# Patient Record
Sex: Male | Born: 1950
Health system: Southern US, Community
[De-identification: ages and names within clinical notes are randomized; demographics above are authoritative.]

## PROBLEM LIST (undated history)

## (undated) DIAGNOSIS — G500 Trigeminal neuralgia: Secondary | ICD-10-CM

## (undated) DIAGNOSIS — H9190 Unspecified hearing loss, unspecified ear: Secondary | ICD-10-CM

## (undated) DIAGNOSIS — S68111A Complete traumatic metacarpophalangeal amputation of left index finger, initial encounter: Secondary | ICD-10-CM

## (undated) DIAGNOSIS — E785 Hyperlipidemia, unspecified: Secondary | ICD-10-CM

## (undated) DIAGNOSIS — E119 Type 2 diabetes mellitus without complications: Secondary | ICD-10-CM

## (undated) DIAGNOSIS — R251 Tremor, unspecified: Secondary | ICD-10-CM

## (undated) HISTORY — DX: Trigeminal neuralgia: G50.0

## (undated) HISTORY — DX: Hyperlipidemia, unspecified: E78.5

## (undated) HISTORY — DX: Type 2 diabetes mellitus without complications: E11.9

## (undated) HISTORY — PX: FINGER AMPUTATION: SHX636

## (undated) HISTORY — PX: INNER EAR SURGERY: SHX679

## (undated) HISTORY — DX: Complete traumatic metacarpophalangeal amputation of left index finger, initial encounter: S68.111A

## (undated) HISTORY — DX: Tremor, unspecified: R25.1

## (undated) HISTORY — DX: Unspecified hearing loss, unspecified ear: H91.90

---
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
## 2004-04-27 ENCOUNTER — Ambulatory Visit: Payer: Self-pay | Admitting: Family Medicine

## 2004-08-01 ENCOUNTER — Ambulatory Visit: Payer: Self-pay | Admitting: Family Medicine

## 2004-09-13 ENCOUNTER — Ambulatory Visit: Payer: Self-pay | Admitting: Family Medicine

## 2004-10-09 ENCOUNTER — Ambulatory Visit: Payer: Self-pay | Admitting: Family Medicine

## 2005-01-09 ENCOUNTER — Ambulatory Visit: Payer: Self-pay | Admitting: Family Medicine

## 2005-05-23 ENCOUNTER — Ambulatory Visit: Payer: Self-pay | Admitting: Family Medicine

## 2005-08-28 ENCOUNTER — Ambulatory Visit: Payer: Self-pay | Admitting: Family Medicine

## 2005-12-05 ENCOUNTER — Ambulatory Visit: Payer: Self-pay | Admitting: Family Medicine

## 2006-04-01 ENCOUNTER — Ambulatory Visit: Payer: Self-pay | Admitting: Family Medicine

## 2006-08-27 ENCOUNTER — Ambulatory Visit: Payer: Self-pay | Admitting: Family Medicine

## 2006-09-17 ENCOUNTER — Ambulatory Visit: Payer: Self-pay | Admitting: Family Medicine

## 2012-03-24 DIAGNOSIS — Z5181 Encounter for therapeutic drug level monitoring: Secondary | ICD-10-CM | POA: Insufficient documentation

## 2012-03-24 DIAGNOSIS — G250 Essential tremor: Secondary | ICD-10-CM | POA: Insufficient documentation

## 2012-03-24 DIAGNOSIS — G252 Other specified forms of tremor: Secondary | ICD-10-CM | POA: Insufficient documentation

## 2012-03-24 DIAGNOSIS — G500 Trigeminal neuralgia: Secondary | ICD-10-CM | POA: Insufficient documentation

## 2012-08-01 ENCOUNTER — Other Ambulatory Visit: Payer: Self-pay

## 2012-08-01 ENCOUNTER — Other Ambulatory Visit: Payer: Self-pay | Admitting: Neurology

## 2012-08-01 MED ORDER — CLONAZEPAM 0.25 MG PO TBDP: 0.2500 mg | ORAL_TABLET | Freq: Two times a day (BID) | ORAL | Status: DC

## 2012-08-01 NOTE — Telephone Encounter (Signed)
Patient called clinic saying he wanted to get a refill on Klonopin 0.25mg  from Dr Anne Hahn.  I will add drug to med list and forward request to prescriber for approval since this is a controlled substance medication.

## 2012-09-20 ENCOUNTER — Encounter: Payer: Self-pay | Admitting: Neurology

## 2012-09-20 DIAGNOSIS — G250 Essential tremor: Secondary | ICD-10-CM

## 2012-09-20 DIAGNOSIS — G500 Trigeminal neuralgia: Secondary | ICD-10-CM

## 2012-09-20 DIAGNOSIS — Z5181 Encounter for therapeutic drug level monitoring: Secondary | ICD-10-CM

## 2012-09-22 ENCOUNTER — Encounter: Payer: Self-pay | Admitting: Neurology

## 2012-09-22 ENCOUNTER — Other Ambulatory Visit: Payer: Self-pay

## 2012-09-22 ENCOUNTER — Ambulatory Visit (INDEPENDENT_AMBULATORY_CARE_PROVIDER_SITE_OTHER): Payer: BC Managed Care – PPO | Admitting: Neurology

## 2012-09-22 VITALS — BP 113/69 | HR 69 | Ht 72.5 in | Wt 167.0 lb

## 2012-09-22 DIAGNOSIS — G250 Essential tremor: Secondary | ICD-10-CM

## 2012-09-22 DIAGNOSIS — Z5181 Encounter for therapeutic drug level monitoring: Secondary | ICD-10-CM

## 2012-09-22 DIAGNOSIS — G500 Trigeminal neuralgia: Secondary | ICD-10-CM

## 2012-09-22 MED ORDER — PREGABALIN 100 MG PO CAPS: 100.0000 mg | ORAL_CAPSULE | Freq: Two times a day (BID) | ORAL | Status: DC

## 2012-09-22 NOTE — Telephone Encounter (Signed)
Patient was at checkout.  Requested a 90 day Rx (rather than 30 day) for Lyrica.  He would like the Rx sent to [facility].

## 2012-09-22 NOTE — Progress Notes (Signed)
Reason for visit: Benign essential tremor  John Reese is an 62 y.o. male  History of present illness:  John Reese is a 62 year old right-handed white male with a history of the right sided trigeminal neuralgia in the V2 distribution, and a history of an essential tremor. The patient was placed on clonazepam last seen, and he believes that this has helped his tremor. The patient is able to feed himself better, but he indicates that when he holds things in his hands, he still has significant tremor. The patient has noted increased pain in the right face within the last month or 6 weeks. The patient will note that shaving or brushing his teeth will bring on the pain. The patient has not noted any change in the appearance of his carbamazepine tablets. The patient remains on Lyrica taking 75 mg twice daily. The patient returns for an evaluation.  Past Medical History  Diagnosis Date  . Diabetes   . Dyslipidemia   . Tremor   . Degenerative arthritis   . Hypertension   . Auditory disturbance     Decreased auditory acuity  . 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 Trigeminal neuralgia     Right, V2 distribution    Past Surgical History  Procedure Laterality Date  . Inner ear surgery Right     Ear drum repair    Family History  Problem Relation Age of Onset  . Stroke Father   . Peripheral vascular disease Sister   . Heart attack Brother     Social history:  reports that he has never smoked. He does not have any smokeless tobacco history on file. He reports that he does not drink alcohol or use illicit drugs.  Allergies: No Known Allergies  Medications:  Current Outpatient Prescriptions on File Prior to Visit  Medication Sig Dispense Refill  . aspirin 81 MG tablet Take 81 mg by mouth daily.      . carbamazepine (TEGRETOL) 200 MG tablet Take 200 mg by mouth 3 (three) times daily.      . clonazePAM (KLONOPIN) 0.25 MG disintegrating  tablet Take 1 tablet (0.25 mg total) by mouth 2 (two) times daily.  60 tablet  5  . fish oil-omega-3 fatty acids 1000 MG capsule Take 2 g by mouth daily.      Marland Kitchen glyBURIDE (DIABETA) 5 MG tablet Take 5 mg by mouth 2 (two) times daily with a meal.      . Multiple Vitamin (MULTIVITAMIN) tablet Take 1 tablet by mouth daily.      . pioglitazone (ACTOS) 45 MG tablet Take 45 mg by mouth daily.      . ramipril (ALTACE) 2.5 MG capsule Take 2.5 mg by mouth daily.      . rosuvastatin (CRESTOR) 20 MG tablet Take 20 mg by mouth daily.      . sitaGLIPtan-metformin (JANUMET) 50-1000 MG per tablet Take 1 tablet by mouth 2 (two) times daily with a meal.       No current facility-administered medications on file prior to visit.    ROS:  Out of a complete 14 system review of symptoms, the patient complains only of the following symptoms, and all other reviewed systems are negative.  Tremor Facial pain  Blood pressure 113/69, pulse 69, height 6' 0.5" (1.842 m), weight 167 lb (75.751 kg).  Physical Exam  General: The patient is alert and cooperative at the time of the examination.  Skin: No  significant peripheral edema is noted.   Neurologic Exam  Cranial nerves: Facial symmetry is present. Speech is normal, no aphasia or dysarthria is noted. Extraocular movements are full. Visual fields are full.  Motor: The patient has good strength in all 4 extremities.  Coordination: The patient has good finger-nose-finger and heel-to-shin bilaterally.  Gait and station: The patient has a normal gait. Tandem gait is normal. Romberg is negative. No drift is seen. The patient does have an intention tremor with finger-nose-finger. With the arms flexed, the tremor in both arms becomes more prominent.  Reflexes: Deep tendon reflexes are symmetric.   Assessment/Plan:  One. Benign essential tremor  2. Right V2 distribution trigeminal neuralgia  The patient will be sent for blood work to check the carbamazepine  levels. The patient will go up on the Lyrica taking 100 mg twice daily. The carbamazepine dose may be adjusted as well if needed. The patient otherwise will followup in 4-6 months.  John Palau MD 09/22/2012 5:34 PM  [facility] [address] [place], [place] [address]  Phone [phone] Fax [phone]

## 2012-09-23 LAB — COMPREHENSIVE METABOLIC PANEL
AST: 12 IU/L (ref 0–40)
Albumin/Globulin Ratio: 2 (ref 1.1–2.5)
Albumin: 4.2 g/dL (ref 3.6–4.8)
BUN: 21 mg/dL (ref 8–27)
Calcium: 9.4 mg/dL (ref 8.6–10.2)
Creatinine, Ser: 1.12 mg/dL (ref 0.76–1.27)
GFR calc non Af Amer: 70 mL/min/{1.73_m2} (ref >59)
Globulin, Total: 2.1 g/dL (ref 1.5–4.5)
Sodium: 141 mmol/L (ref 134–144)
Total Protein: 6.3 g/dL (ref 6.0–8.5)

## 2012-09-23 LAB — CBC WITH DIFFERENTIAL
Basos: 0 % (ref 0–3)
Eos: 4 % (ref 0–5)
Eosinophils Absolute: 0.2 10*3/uL (ref 0.0–0.4)
HCT: 39 % (ref 37.5–51.0)
Hemoglobin: 13 g/dL (ref 12.6–17.7)
Lymphocytes Absolute: 1.5 10*3/uL (ref 0.7–3.1)
MCH: 29 pg (ref 26.6–33.0)
MCHC: 33.3 g/dL (ref 31.5–35.7)
MCV: 87 fL (ref 79–97)
Monocytes Absolute: 0.4 10*3/uL (ref 0.1–0.9)
Neutrophils Absolute: 2 10*3/uL (ref 1.4–7.0)

## 2012-09-23 NOTE — Progress Notes (Signed)
Quick Note:  I called and left a message for the patient that his lab results were normal. ______ 

## 2012-11-25 ENCOUNTER — Other Ambulatory Visit: Payer: Self-pay

## 2012-11-25 MED ORDER — PROPRANOLOL HCL ER 120 MG PO CP24: 120.0000 mg | ORAL_CAPSULE | Freq: Every day | ORAL | Status: DC

## 2013-01-30 ENCOUNTER — Other Ambulatory Visit: Payer: Self-pay | Admitting: Neurology

## 2013-02-18 ENCOUNTER — Other Ambulatory Visit: Payer: Self-pay | Admitting: Neurology

## 2013-03-24 ENCOUNTER — Other Ambulatory Visit: Payer: Self-pay

## 2013-03-24 MED ORDER — PREGABALIN 100 MG PO CAPS: 100.0000 mg | ORAL_CAPSULE | Freq: Two times a day (BID) | ORAL | Status: DC

## 2013-03-25 NOTE — Telephone Encounter (Signed)
Patient's prescription was faxed over to [facility] at [phone]. °

## 2013-04-03 ENCOUNTER — Ambulatory Visit (INDEPENDENT_AMBULATORY_CARE_PROVIDER_SITE_OTHER): Payer: BC Managed Care – PPO | Admitting: Neurology

## 2013-04-03 ENCOUNTER — Encounter: Payer: Self-pay | Admitting: Neurology

## 2013-04-03 VITALS — BP 121/72 | HR 68 | Ht 73.0 in | Wt 158.0 lb

## 2013-04-03 DIAGNOSIS — G250 Essential tremor: Secondary | ICD-10-CM

## 2013-04-03 DIAGNOSIS — G500 Trigeminal neuralgia: Secondary | ICD-10-CM

## 2013-04-03 MED ORDER — PREGABALIN 75 MG PO CAPS: 75.0000 mg | ORAL_CAPSULE | Freq: Two times a day (BID) | ORAL | Status: DC

## 2013-04-03 MED ORDER — CARBAMAZEPINE 200 MG PO TABS: ORAL_TABLET | ORAL | Status: DC

## 2013-04-03 NOTE — Patient Instructions (Signed)
Trigeminal Neuralgia Trigeminal neuralgia is a nerve disorder that causes sudden attacks of severe facial pain. It is caused by damage to the trigeminal nerve, a major nerve in the face. It is more common in women and in the elderly, although it can also happen in younger patients. Attacks last from a few seconds to several minutes and can occur from a couple of times per year to several times per day. Trigeminal neuralgia can be a very distressing and disabling condition. Surgery may be needed in very severe cases if medical treatment does not give relief. HOME CARE INSTRUCTIONS   If your caregiver prescribed medication to help prevent attacks, take as directed.  To help prevent attacks:  Chew on the unaffected side of the mouth.  Avoid touching your face.  Avoid blasts of hot or cold air.  Men may wish to grow a beard to avoid having to shave. SEEK IMMEDIATE MEDICAL CARE IF:  Pain is unbearable and your medicine does not help.  You develop new, unexplained symptoms (problems).  You have problems that may be related to a medication you are taking. Document Released: 04/06/2000 Document Revised: 07/02/2011 Document Reviewed: 02/04/2009 [employer] Patient Information 2014 [employer].  

## 2013-04-03 NOTE — Progress Notes (Signed)
Reason for visit: Trigeminal neuralgia  John Reese is an 62 y.o. male  History of present illness:  [name] is a 62 year old right handed white male with a history of an essential tremor and a history of a right trigeminal neuralgia in the V2 distribution. The patient has been on carbamazepine 200 mg 3 times daily, with the last blood level of 5.2. The patient was placed on Lyrica, and the dose was increased on the last revisit to 100 mg twice daily. Since that time, the patient has begun having what sounds like asterixis of the arms and legs, associated with occasional falls. The patient initially was doing well with the pain, but as the cold weather has come in, the pain has returned. The patient has sensitivity to touch on the base of the nose on the right, and talking and chewing will bring on pain. The patient returns to the office today for an evaluation.  Past Medical History  Diagnosis Date  . Diabetes   . Dyslipidemia   . Tremor   . Degenerative arthritis   . Hypertension   . Auditory disturbance     Decreased auditory acuity  . 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 Trigeminal neuralgia     Right, V2 distribution    Past Surgical History  Procedure Laterality Date  . Inner ear surgery Right     Ear drum repair    Family History  Problem Relation Age of Onset  . Stroke Father   . Peripheral vascular disease Sister   . Heart attack Brother     Social history:  reports that he has never smoked. He does not have any smokeless tobacco history on file. He reports that he does not drink alcohol or use illicit drugs.   No Known Allergies  Medications:  Current Outpatient Prescriptions on File Prior to Visit  Medication Sig Dispense Refill  . aspirin 81 MG tablet Take 81 mg by mouth daily.      . Canagliflozin (INVOKANA) 100 MG TABS Take 100 mg by mouth daily.      . clonazePAM (KLONOPIN) 0.25 MG disintegrating  tablet TAKE 1 TALBET BY MOUTH TWICE DAILY  60 tablet  3  . fish oil-omega-3 fatty acids 1000 MG capsule Take 2 g by mouth daily.      Marland Kitchen glyBURIDE (DIABETA) 5 MG tablet Take 5 mg by mouth 2 (two) times daily with a meal.      . Multiple Vitamin (MULTIVITAMIN) tablet Take 1 tablet by mouth daily.      . propranolol ER (INDERAL LA) 120 MG 24 hr capsule Take 1 capsule (120 mg total) by mouth daily.  90 capsule  1  . ramipril (ALTACE) 2.5 MG capsule Take 2.5 mg by mouth daily.      . sitaGLIPtan-metformin (JANUMET) 50-1000 MG per tablet Take 1 tablet by mouth 2 (two) times daily with a meal.       No current facility-administered medications on file prior to visit.    ROS:  Out of a complete 14 system review of symptoms, the patient complains only of the following symptoms, and all other reviewed systems are negative.  Tremors Facial pain  Blood pressure 121/72, pulse 68, height 6\' 1"  (1.854 m), weight 158 lb (71.668 kg).  Physical Exam  General: The patient is alert and cooperative at the time of the examination.  Skin: No significant peripheral edema is noted.  Neurologic Exam  Mental status: The patient is oriented x 3.  Cranial nerves: Facial symmetry is present. Speech is normal, no aphasia or dysarthria is noted. Extraocular movements are full. Visual fields are full.  Motor: The patient has good strength in all 4 extremities.  Sensory examination: Soft touch sensation is symmetric on the face, arms, and legs.  Coordination: The patient has good finger-nose-finger and heel-to-shin bilaterally. No asterixis is seen with the arms outstretched, the patient does have an intention tremor with finger-nose-finger.  Gait and station: The patient has a normal gait. Tandem gait is normal. Romberg is negative. No drift is seen.  Reflexes: Deep tendon reflexes are symmetric.   Assessment/Plan:  1. Right trigeminal neuralgia  2. Benign essential tremor  The patient is having  asterixis on Lyrica, and the dosing will need to be reduced. The patient will go to 75 mg twice daily. The patient will be increased on the carbamazepine taking 1-1/2 of the 200 mg tablets in the morning and evening, one tablet at midday. The patient will followup in 6 months, but he will call our office if he is not doing well.  Marlan Palau MD 04/04/2013 11:01 AM  [facility] [address] [place], [place] [address]  Phone [phone] Fax [phone]

## 2013-05-06 ENCOUNTER — Other Ambulatory Visit: Payer: Self-pay | Admitting: Neurology

## 2013-05-28 ENCOUNTER — Other Ambulatory Visit: Payer: Self-pay | Admitting: Neurology

## 2013-05-29 NOTE — Telephone Encounter (Signed)
Rx signed and faxed.

## 2013-06-01 ENCOUNTER — Other Ambulatory Visit: Payer: Self-pay

## 2013-06-01 MED ORDER — PREGABALIN 75 MG PO CAPS: 75.0000 mg | ORAL_CAPSULE | Freq: Two times a day (BID) | ORAL | Status: DC

## 2013-06-01 NOTE — Telephone Encounter (Signed)
Rx signed and faxed.

## 2013-10-09 ENCOUNTER — Encounter (INDEPENDENT_AMBULATORY_CARE_PROVIDER_SITE_OTHER): Payer: Self-pay

## 2013-10-09 ENCOUNTER — Ambulatory Visit (INDEPENDENT_AMBULATORY_CARE_PROVIDER_SITE_OTHER): Payer: BC Managed Care – PPO | Admitting: Neurology

## 2013-10-09 ENCOUNTER — Encounter: Payer: Self-pay | Admitting: Neurology

## 2013-10-09 VITALS — BP 120/64 | HR 68 | Wt 160.0 lb

## 2013-10-09 DIAGNOSIS — G500 Trigeminal neuralgia: Secondary | ICD-10-CM

## 2013-10-09 DIAGNOSIS — G252 Other specified forms of tremor: Secondary | ICD-10-CM

## 2013-10-09 DIAGNOSIS — G250 Essential tremor: Secondary | ICD-10-CM

## 2013-10-09 DIAGNOSIS — Z5181 Encounter for therapeutic drug level monitoring: Secondary | ICD-10-CM

## 2013-10-09 MED ORDER — PROPRANOLOL HCL ER 120 MG PO CP24: 120.0000 mg | ORAL_CAPSULE | Freq: Every day | ORAL | Status: DC

## 2013-10-09 MED ORDER — GABAPENTIN 300 MG PO CAPS: 300.0000 mg | ORAL_CAPSULE | Freq: Two times a day (BID) | ORAL | Status: DC

## 2013-10-09 MED ORDER — CARBAMAZEPINE 200 MG PO TABS: ORAL_TABLET | ORAL | Status: DC

## 2013-10-09 NOTE — Patient Instructions (Signed)

## 2013-10-09 NOTE — Progress Notes (Signed)
Reason for visit: Trigeminal neuralgia  John Reese is an 63 y.o. male  History of present illness:  John Reese is a 63 year old right-handed white male with a history of a right V2 distribution trigeminal neuralgia and a history of a benign essential tremor. The patient has done well with the increase in the carbamazepine dosing, and a reduction in the Lyrica dosing. He was having asterixis on Lyrica at 100 mg twice daily. The patient is on 75 mg twice daily. He is concerned about the cost of the medication. The patient is also having some cognitive issues on the higher dose of the carbamazepine. He is doing well however, with the trigeminal neuralgia pain. He returns for an evaluation. He takes propranolol for his tremors.  Past Medical History  Diagnosis Date  . Diabetes   . Dyslipidemia   . Tremor   . Degenerative arthritis   . Hypertension   . Auditory disturbance     Decreased auditory acuity  . 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 Trigeminal neuralgia     Right, V2 distribution    Past Surgical History  Procedure Laterality Date  . Inner ear surgery Right     Ear drum repair    Family History  Problem Relation Age of Onset  . Stroke Father   . Peripheral vascular disease Sister   . Heart attack Brother     Social history:  reports that he has never smoked. He has never used smokeless tobacco. He reports that he does not drink alcohol or use illicit drugs.   No Known Allergies  Medications:  Current Outpatient Prescriptions on File Prior to Visit  Medication Sig Dispense Refill  . aspirin 81 MG tablet Take 81 mg by mouth daily.      . clonazePAM (KLONOPIN) 0.25 MG disintegrating tablet TAKE 1 TABLET BY MOUTH TWICE A DAY  60 tablet  5  . fish oil-omega-3 fatty acids 1000 MG capsule Take 2 g by mouth daily.      Marland Kitchen glyBURIDE (DIABETA) 5 MG tablet Take 5 mg by mouth 2 (two) times daily with a meal.      .  Multiple Vitamin (MULTIVITAMIN) tablet Take 1 tablet by mouth daily.      . ramipril (ALTACE) 2.5 MG capsule Take 2.5 mg by mouth daily.      . rosuvastatin (CRESTOR) 10 MG tablet Take 10 mg by mouth daily.      . sitaGLIPtan-metformin (JANUMET) 50-1000 MG per tablet Take 1 tablet by mouth 2 (two) times daily with a meal.       No current facility-administered medications on file prior to visit.    ROS:  Out of a complete 14 system review of symptoms, the patient complains only of the following symptoms, and all other reviewed systems are negative.  Tremors Concentration problems  Blood pressure 120/64, pulse 68, weight 160 lb (72.576 kg).  Physical Exam  General: The patient is alert and cooperative at the time of the examination.  Skin: No significant peripheral edema is noted.   Neurologic Exam  Mental status: The patient is oriented x 3.  Cranial nerves: Facial symmetry is present. Speech is normal, no aphasia or dysarthria is noted. Extraocular movements are full. Visual fields are full.  Motor: The patient has good strength in all 4 extremities.  Sensory examination: Soft touch sensation is symmetric on the face, arms, and legs.  Coordination: The patient has  good finger-nose-finger and heel-to-shin bilaterally. An intention tremor seen with finger-nose-finger bilaterally.  Gait and station: The patient has a normal gait. Tandem gait is normal. Romberg is negative. No drift is seen.  Reflexes: Deep tendon reflexes are symmetric.   Assessment/Plan:  One. Benign essential tremor  2. Right V2 distribution trigeminal neuralgia  The patient will have carbamazepine levels drawn today. He reports some cognitive side effects on the medication. He is concerned about the cost of the Lyrica, and we will try switching to gabapentin. The patient will start at 300 mg twice daily, and he will contact me if dosage adjustments are needed. He otherwise will followup in 6  months.  Jill Alexanders MD 10/11/2013 9:06 PM  [facility] [address] [place], [place] [address]  Phone [phone] Fax [phone]

## 2013-10-10 LAB — CARBAMAZEPINE LEVEL, TOTAL: Carbamazepine Lvl: 5.2 ug/mL (ref 4.0–12.0)

## 2013-10-12 NOTE — Progress Notes (Signed)
Quick Note:  Shared lab results thru VM message ______

## 2013-11-20 ENCOUNTER — Encounter: Payer: Self-pay | Admitting: Neurology

## 2013-11-29 ENCOUNTER — Other Ambulatory Visit: Payer: Self-pay | Admitting: Neurology

## 2013-11-30 NOTE — Telephone Encounter (Signed)
Dr Willis is out of the office.  Forwarding request to [facility] for approval.  

## 2013-12-07 ENCOUNTER — Telehealth: Payer: Self-pay | Admitting: Neurology

## 2013-12-07 MED ORDER — GABAPENTIN 300 MG PO CAPS: 300.0000 mg | ORAL_CAPSULE | Freq: Two times a day (BID) | ORAL | Status: DC

## 2013-12-07 NOTE — Telephone Encounter (Signed)
Rx has been sent  

## 2013-12-07 NOTE — Telephone Encounter (Signed)
Patient's wife requesting 3 month supply of Gabapentin [facility].

## 2014-01-25 ENCOUNTER — Other Ambulatory Visit: Payer: Self-pay | Admitting: Neurology

## 2014-01-25 NOTE — Telephone Encounter (Signed)
Dr [name] is out of the office.  Forwarding request to [facility] for review.

## 2014-01-28 NOTE — Telephone Encounter (Signed)
Spouse called and stated [facility] has not received Rx refill request for clonazePAM (KLONOPIN) 0.25 MG disintegrating table.  Please call and advise.

## 2014-01-29 NOTE — Telephone Encounter (Signed)
Rx signed and faxed.

## 2014-02-23 ENCOUNTER — Emergency Department (HOSPITAL_COMMUNITY)
Admission: EM | Admit: 2014-02-23 | Discharge: 2014-02-23 | Disposition: A | Discharge status: Home / Self Care | Payer: BC Managed Care – PPO | Attending: Emergency Medicine | Admitting: Emergency Medicine

## 2014-02-23 ENCOUNTER — Encounter (HOSPITAL_COMMUNITY): Payer: Self-pay | Admitting: Emergency Medicine

## 2014-02-23 ENCOUNTER — Emergency Department (HOSPITAL_COMMUNITY): Payer: BC Managed Care – PPO

## 2014-02-23 DIAGNOSIS — G500 Trigeminal neuralgia: Secondary | ICD-10-CM | POA: Diagnosis not present

## 2014-02-23 DIAGNOSIS — E785 Hyperlipidemia, unspecified: Secondary | ICD-10-CM | POA: Insufficient documentation

## 2014-02-23 DIAGNOSIS — I10 Essential (primary) hypertension: Secondary | ICD-10-CM | POA: Insufficient documentation

## 2014-02-23 DIAGNOSIS — H9190 Unspecified hearing loss, unspecified ear: Secondary | ICD-10-CM | POA: Diagnosis not present

## 2014-02-23 DIAGNOSIS — Z7982 Long term (current) use of aspirin: Secondary | ICD-10-CM | POA: Diagnosis not present

## 2014-02-23 DIAGNOSIS — E1165 Type 2 diabetes mellitus with hyperglycemia: Secondary | ICD-10-CM | POA: Diagnosis not present

## 2014-02-23 DIAGNOSIS — Z79899 Other long term (current) drug therapy: Secondary | ICD-10-CM | POA: Insufficient documentation

## 2014-02-23 DIAGNOSIS — R0602 Shortness of breath: Secondary | ICD-10-CM | POA: Diagnosis not present

## 2014-02-23 DIAGNOSIS — M1990 Unspecified osteoarthritis, unspecified site: Secondary | ICD-10-CM | POA: Insufficient documentation

## 2014-02-23 LAB — CBC WITH DIFFERENTIAL/PLATELET
Basophils Absolute: 0 10*3/uL (ref 0.0–0.1)
Basophils Relative: 0 % (ref 0–1)
EOS ABS: 0.1 10*3/uL (ref 0.0–0.7)
Eosinophils Relative: 2 % (ref 0–5)
HCT: 39.7 % (ref 39.0–52.0)
HEMOGLOBIN: 13.6 g/dL (ref 13.0–17.0)
Lymphocytes Relative: 35 % (ref 12–46)
Lymphs Abs: 1.3 10*3/uL (ref 0.7–4.0)
MCH: 29.6 pg (ref 26.0–34.0)
MCHC: 34.3 g/dL (ref 30.0–36.0)
MCV: 86.3 fL (ref 78.0–100.0)
MONOS PCT: 8 % (ref 3–12)
Monocytes Absolute: 0.3 10*3/uL (ref 0.1–1.0)
NEUTROS ABS: 2 10*3/uL (ref 1.7–7.7)
Neutrophils Relative %: 55 % (ref 43–77)
Platelets: 157 10*3/uL (ref 150–400)
RBC: 4.6 MIL/uL (ref 4.22–5.81)
RDW: 12.3 % (ref 11.5–15.5)
WBC: 3.7 10*3/uL — ABNORMAL LOW (ref 4.0–10.5)

## 2014-02-23 LAB — CBG MONITORING, ED
Glucose-Capillary: 350 mg/dL — ABNORMAL HIGH (ref 70–99)
Glucose-Capillary: 236 mg/dL — ABNORMAL HIGH (ref 70–99)

## 2014-02-23 LAB — URINALYSIS, ROUTINE W REFLEX MICROSCOPIC
Bilirubin Urine: NEGATIVE
Glucose, UA: >1000 mg/dL — AB
Hgb urine dipstick: NEGATIVE
Ketones, ur: 40 mg/dL — AB
LEUKOCYTES UA: NEGATIVE
NITRITE: NEGATIVE
PH: 5 (ref 5.0–8.0)
Protein, ur: NEGATIVE mg/dL
SPECIFIC GRAVITY, URINE: 1.015 (ref 1.005–1.030)
UROBILINOGEN UA: 0.2 mg/dL (ref 0.0–1.0)

## 2014-02-23 LAB — COMPREHENSIVE METABOLIC PANEL WITH GFR
ALT: 13 U/L (ref 0–53)
AST: 11 U/L (ref 0–37)
Albumin: 3.6 g/dL (ref 3.5–5.2)
Alkaline Phosphatase: 55 U/L (ref 39–117)
Anion gap: 13 (ref 5–15)
BUN: 26 mg/dL — ABNORMAL HIGH (ref 6–23)
CO2: 26 meq/L (ref 19–32)
Calcium: 9.8 mg/dL (ref 8.4–10.5)
Chloride: 98 meq/L (ref 96–112)
Creatinine, Ser: 1.09 mg/dL (ref 0.50–1.35)
GFR calc Af Amer: 80 mL/min — ABNORMAL LOW
GFR calc non Af Amer: 69 mL/min — ABNORMAL LOW
Glucose, Bld: 394 mg/dL — ABNORMAL HIGH (ref 70–99)
Potassium: 5.6 meq/L — ABNORMAL HIGH (ref 3.7–5.3)
Sodium: 137 meq/L (ref 137–147)
Total Bilirubin: <0.2 mg/dL — ABNORMAL LOW (ref 0.3–1.2)
Total Protein: 6.5 g/dL (ref 6.0–8.3)

## 2014-02-23 LAB — PROTIME-INR
INR: 0.98 (ref 0.00–1.49)
Prothrombin Time: 13.1 s (ref 11.6–15.2)

## 2014-02-23 LAB — URINE MICROSCOPIC-ADD ON

## 2014-02-23 LAB — PRO B NATRIURETIC PEPTIDE: Pro B Natriuretic peptide (BNP): 38.1 pg/mL (ref 0–125)

## 2014-02-23 LAB — TROPONIN I: Troponin I: <0.3 ng/mL

## 2014-02-23 MED ORDER — IOHEXOL 350 MG/ML SOLN
100.0000 mL | Freq: Once | INTRAVENOUS | Status: AC | PRN
  Administered 2014-02-23: 100 mL via INTRAVENOUS

## 2014-02-23 MED ORDER — SODIUM POLYSTYRENE SULFONATE 15 GM/60ML PO SUSP
30.0000 g | Freq: Once | ORAL | Status: AC
  Administered 2014-02-23: 30 g via ORAL
  Filled 2014-02-23: qty 120

## 2014-02-23 MED ORDER — SODIUM CHLORIDE 0.9 % IV BOLUS (SEPSIS)
1000.0000 mL | Freq: Once | INTRAVENOUS | Status: AC
  Administered 2014-02-23: 1000 mL via INTRAVENOUS

## 2014-02-23 NOTE — ED Provider Notes (Signed)
CSN: [id number]     Arrival date & time 02/23/14  1049 History   First MD Initiated Contact with Patient 02/23/14 1110     Chief Complaint  Patient presents with  . Shortness of Breath  . Hyperglycemia     (Consider location/radiation/quality/duration/timing/severity/associated sxs/prior Treatment) Patient is a 63 y.o. male presenting with shortness of breath and hyperglycemia. The history is provided by the patient.  Shortness of Breath Severity:  Moderate Onset quality:  Gradual Duration:  3 days Timing:  Intermittent Progression:  Worsening Chronicity:  New Context: activity   Context comment:  Worse in the mornings Relieved by:  Nothing Worsened by:  Movement Ineffective treatments:  None tried Associated symptoms: no abdominal pain, no cough, no fever and no vomiting   Hyperglycemia Associated symptoms: no abdominal pain, no fever, no shortness of breath and no vomiting     Past Medical History  Diagnosis Date  . Diabetes   . Dyslipidemia   . Tremor   . Degenerative arthritis   . Hypertension   . Auditory disturbance     Decreased auditory acuity  . 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 Trigeminal neuralgia     Right, V2 distribution   Past Surgical History  Procedure Laterality Date  . Inner ear surgery Right     Ear drum repair   Family History  Problem Relation Age of Onset  . Stroke Father   . Peripheral vascular disease Sister   . Heart attack Brother    History  Substance Use Topics  . Smoking status: Never Smoker   . Smokeless tobacco: Never Used  . Alcohol Use: No    Review of Systems  Constitutional: Negative for fever.  Respiratory: Negative for cough and shortness of breath.   Gastrointestinal: Negative for vomiting and abdominal pain.  All other systems reviewed and are negative.     Allergies  Review of patient's allergies indicates no known allergies.  Home Medications   Prior to  Admission medications   Medication Sig Start Date End Date Taking? Authorizing Provider  aspirin 81 MG tablet Take 81 mg by mouth daily.    Historical Provider, MD  carbamazepine (TEGRETOL) 200 MG tablet 1.5 tablets in the morning an in the evening, one tablet at midday 10/09/13   Kathrynn Ducking, MD  clonazePAM (KLONOPIN) 0.25 MG disintegrating tablet DISSOLVE 1 TABLET IN MOUTH 2 TIMES A DAY 01/28/14   Kathrynn Ducking, MD  fish oil-omega-3 fatty acids 1000 MG capsule Take 2 g by mouth daily.    Historical Provider, MD  gabapentin (NEURONTIN) 300 MG capsule Take 1 capsule (300 mg total) by mouth 2 (two) times daily. 12/07/13   Kathrynn Ducking, MD  glyBURIDE (DIABETA) 5 MG tablet Take 5 mg by mouth 2 (two) times daily with a meal.    Historical Provider, MD  Multiple Vitamin (MULTIVITAMIN) tablet Take 1 tablet by mouth daily.    Historical Provider, MD  propranolol ER (INDERAL LA) 120 MG 24 hr capsule Take 1 capsule (120 mg total) by mouth daily. 10/09/13   Kathrynn Ducking, MD  ramipril (ALTACE) 2.5 MG capsule Take 2.5 mg by mouth daily.    Historical Provider, MD  rosuvastatin (CRESTOR) 10 MG tablet Take 10 mg by mouth daily.    Historical Provider, MD  sitaGLIPtan-metformin (JANUMET) 50-1000 MG per tablet Take 1 tablet by mouth 2 (two) times daily with a meal.    Historical Provider,  MD   BP 119/64 mmHg  Pulse 72  Temp(Src) 98.4 F (36.9 C) (Oral)  Resp 20  Ht 6\' 1"  (1.854 m)  Wt 150 lb (68.04 kg)  BMI 19.79 kg/m2  SpO2 98% Physical Exam  Constitutional: He is oriented to person, place, and time. He appears well-developed and well-nourished. No distress.  HENT:  Head: Normocephalic and atraumatic.  Mouth/Throat: No oropharyngeal exudate.  Eyes: EOM are normal. Pupils are equal, round, and reactive to light.  Neck: Normal range of motion. Neck supple.  Cardiovascular: Normal rate and regular rhythm.  Exam reveals no friction rub.   No murmur heard. Pulmonary/Chest: Effort normal and  breath sounds normal. No respiratory distress. He has no wheezes. He has no rales.  Abdominal: Soft. He exhibits no distension. There is no tenderness. There is no rebound.  Musculoskeletal: Normal range of motion. He exhibits no edema.  Neurological: He is alert and oriented to person, place, and time. No cranial nerve deficit. He exhibits normal muscle tone. Coordination normal.  Skin: No rash noted. He is not diaphoretic.  Nursing note and vitals reviewed.   ED Course  Procedures (including critical care time) Labs Review Labs Reviewed  CBG MONITORING, ED - Abnormal; Notable for the following:    Glucose-Capillary 350 (*)    All other components within normal limits  COMPREHENSIVE METABOLIC PANEL  TROPONIN I  PRO B NATRIURETIC PEPTIDE  CBC WITH DIFFERENTIAL  PROTIME-INR  URINALYSIS, ROUTINE W REFLEX MICROSCOPIC    Imaging Review Dg Chest 2 View  02/23/2014   CLINICAL DATA:  Shortness of breath since Saturday  EXAM: CHEST  2 VIEW  COMPARISON:  None.  FINDINGS: The lungs are mildly hyperinflated. There is no focal infiltrate. The interstitial markings are coarse bilaterally. The heart and pulmonary vascularity are normal. The trachea is midline. The observed bony thorax is unremarkable.  IMPRESSION: There is mild hyperinflation which may be voluntary or could reflect underlying COPD or reactive airway disease. There is no evidence of pneumonia,CHF, nor other acute cardiopulmonary abnormality.   Electronically Signed   By: David  Martinique   On: 02/23/2014 12:58   Ct Angio Chest Pe W/cm &/or Wo Cm  02/23/2014   CLINICAL DATA:  Mid chest pain and shortness of breath for 3 days. History of diabetes.  EXAM: CT ANGIOGRAPHY CHEST WITH CONTRAST  TECHNIQUE: Multidetector CT imaging of the chest was performed using the standard protocol during bolus administration of intravenous contrast. Multiplanar CT image reconstructions and MIPs were obtained to evaluate the vascular anatomy.  CONTRAST:  142mL  OMNIPAQUE IOHEXOL 350 MG/ML SOLN  COMPARISON:  Two-view chest x-ray from the same day.  FINDINGS: Pulmonary arterial opacification is excellent. No focal filling defects are evident to suggest pulmonary emboli. Heart size is normal. Coronary artery calcifications are present within the LAD. No significant mediastinal adenopathy is present. The thoracic inlet is within normal limits. The upper abdomen is unremarkable.  The lung windows demonstrates no focal nodule, mass, or airspace disease. Mild dependent atelectasis is present bilaterally. There is no edema or pneumothorax.  The bone windows demonstrate degenerative changes anteriorly in the upper thoracic spine. Vertebral body heights and alignment are maintained. No acute fracture is present.  Review of the MIP images confirms the above findings.  IMPRESSION: 1. No evidence for pulmonary embolus. 2. The coronary artery disease. 3. No acute or focal abnormality to explain the patient's symptoms.   Electronically Signed   By: Lawrence Santiago M.D.   On: 02/23/2014 14:12  EKG Interpretation   Date/Time:  Tuesday February 23 2014 10:56:19 EST Ventricular Rate:  75 PR Interval:  138 QRS Duration: 92 QT Interval:  370 QTC Calculation: 413 R Axis:   97 Text Interpretation:  Normal sinus rhythm Rightward axis Borderline ECG No  prior for comparison Confirmed by Mingo Amber  MD, Sheniya Garciaperez ([id number]) on 02/23/2014  11:33:20 AM      MDM   Final diagnoses:  Shortness of breath    63 year old male presents with shortness of breath for the past 3 days. He's also been having worsening hyperglycemia for the past month despite increasingly aggressive anti-hyperglycemia medications. Denies any chest pain. Shortness of breath is occasionally worse with getting up and moving around, but will also happen randomly. No fevers, vomiting, abdominal pain. Here vitals stable. Exam benign. Will start with labs including cardiac workup, urine, chest x-ray. Labs all ok. CT PE  scan normal. Patient would like to go home, I see no reason for admission. SOB sporadic, not consistent. Plan for stress test, Cards consulted for outpt f/u. Discharged home.    Evelina Bucy, MD 02/23/14 [phone]

## 2014-02-23 NOTE — ED Notes (Signed)
Patient states he has been having SOB since Saturday.   Patient states that it has gotten increasingly worse.   Patient states that he went to diabetic doctor yesterday to advise that his sugars have been high.   Patient states he called ambulance on Sunday but wouldn't let them transport him for assistance.  Patient states went to urgent care about noon on Sunday with back pain but did not discuss his SOB with them at that time.   Patient denies having any extremity weakness or other symptoms.

## 2014-02-23 NOTE — Discharge Instructions (Signed)

## 2014-02-23 NOTE — ED Notes (Signed)
CBG 350 

## 2014-02-23 NOTE — ED Notes (Signed)
Patient transported to CT 

## 2014-03-01 ENCOUNTER — Ambulatory Visit: Payer: BC Managed Care – PPO | Admitting: Cardiovascular Disease

## 2014-03-10 ENCOUNTER — Encounter: Payer: Self-pay | Admitting: Neurology

## 2014-03-16 ENCOUNTER — Encounter: Payer: Self-pay | Admitting: Neurology

## 2014-03-22 ENCOUNTER — Encounter: Payer: Self-pay | Admitting: *Deleted

## 2014-03-24 ENCOUNTER — Ambulatory Visit (INDEPENDENT_AMBULATORY_CARE_PROVIDER_SITE_OTHER): Payer: BC Managed Care – PPO | Admitting: Cardiology

## 2014-03-24 ENCOUNTER — Encounter: Payer: Self-pay | Admitting: Cardiology

## 2014-03-24 VITALS — BP 122/70 | HR 68 | Ht 73.0 in | Wt 160.0 lb

## 2014-03-24 DIAGNOSIS — I10 Essential (primary) hypertension: Secondary | ICD-10-CM

## 2014-03-24 DIAGNOSIS — Z8249 Family history of ischemic heart disease and other diseases of the circulatory system: Secondary | ICD-10-CM

## 2014-03-24 DIAGNOSIS — E782 Mixed hyperlipidemia: Secondary | ICD-10-CM | POA: Insufficient documentation

## 2014-03-24 DIAGNOSIS — E1159 Type 2 diabetes mellitus with other circulatory complications: Secondary | ICD-10-CM | POA: Insufficient documentation

## 2014-03-24 DIAGNOSIS — E785 Hyperlipidemia, unspecified: Secondary | ICD-10-CM

## 2014-03-24 DIAGNOSIS — Z794 Long term (current) use of insulin: Secondary | ICD-10-CM

## 2014-03-24 DIAGNOSIS — IMO0002 Reserved for concepts with insufficient information to code with codable children: Secondary | ICD-10-CM

## 2014-03-24 DIAGNOSIS — R0789 Other chest pain: Secondary | ICD-10-CM

## 2014-03-24 DIAGNOSIS — E1169 Type 2 diabetes mellitus with other specified complication: Secondary | ICD-10-CM | POA: Insufficient documentation

## 2014-03-24 DIAGNOSIS — E1165 Type 2 diabetes mellitus with hyperglycemia: Secondary | ICD-10-CM

## 2014-03-24 DIAGNOSIS — R0602 Shortness of breath: Secondary | ICD-10-CM

## 2014-03-24 NOTE — Progress Notes (Signed)
[facility]. [address] [place], [place]  [address] Phone: [phone] Fax:  [phone]  Date:  03/24/2014   ID:  John Reese, DOB 26-Jan-1951, MRN [id number]  PCP:  [name], MD   History of Present Illness: John Reese is a 63 y.o. male here for the evaluation of shortness of breath having been seen previously in the emergency department on 02/23/14. His symptoms at that time were moderate in severity over 3 days duration intermittent worse in the mornings and worse with activity. He has a history of diabetes, hyperlipidemia, hypertension. Nonsmoker. Father had stroke. Brother had heart attack 56. Mother pacer.  First time was deer hunting, coming out woods, could not catch breath. Second time at work. Works were also slurring. Felt some tightness in chest. Monday OK.   CT scan was performed and was negative for pulmonary embolism. Chest x-ray benign. CT scan did however show mid LAD calcification of coronary artery as well as proximal RCA calcification.   Wt Readings from Last 3 Encounters:  03/24/14 160 lb (72.576 kg)  02/23/14 150 lb (68.04 kg)  10/09/13 160 lb (72.576 kg)     Past Medical History  Diagnosis Date  . Diabetes   . Dyslipidemia   . Tremor   . Degenerative arthritis   . Hypertension   . Auditory disturbance     Decreased auditory acuity  . 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 Trigeminal neuralgia     Right, V2 distribution    Past Surgical History  Procedure Laterality Date  . Inner ear surgery Right     Ear drum repair    Current Outpatient Prescriptions  Medication Sig Dispense Refill  . aspirin 81 MG tablet Take 81 mg by mouth daily.    . carbamazepine (TEGRETOL) 200 MG tablet 1.5 tablets in the morning an in the evening, one tablet at midday 360 tablet 3  . clonazePAM (KLONOPIN) 0.25 MG disintegrating tablet Take 0.25 mg by mouth 2 (two) times daily.    .  cyclobenzaprine (FLEXERIL) 10 MG tablet Take 10 mg by mouth daily.  0  . fish oil-omega-3 fatty acids 1000 MG capsule Take 2 g by mouth daily.    Marland Kitchen gabapentin (NEURONTIN) 300 MG capsule Take 1 capsule (300 mg total) by mouth 2 (two) times daily. 180 capsule 1  . Insulin Glargine (TOUJEO SOLOSTAR [place]) Inject 18 Units into the skin at bedtime.    . metFORMIN (GLUCOPHAGE) 1000 MG tablet Take 1,000 mg by mouth 2 (two) times daily with a meal.    . Multiple Vitamin (MULTIVITAMIN) tablet Take 1 tablet by mouth daily.    . pravastatin (PRAVACHOL) 40 MG tablet Take 40 mg by mouth daily.    . propranolol ER (INDERAL LA) 120 MG 24 hr capsule Take 1 capsule (120 mg total) by mouth daily. 90 capsule 3  . ramipril (ALTACE) 2.5 MG capsule Take 2.5 mg by mouth daily.    . clonazePAM (KLONOPIN) 0.25 MG disintegrating tablet DISSOLVE 1 TABLET IN MOUTH 2 TIMES A DAY (Patient not taking: Reported on 02/23/2014) 60 tablet 3  . glyBURIDE (DIABETA) 5 MG tablet Take 5 mg by mouth 2 (two) times daily with a meal.    . rosuvastatin (CRESTOR) 10 MG tablet Take 10 mg by mouth daily.    . sitaGLIPtan-metformin (JANUMET) 50-1000 MG per tablet Take 1 tablet by mouth 2 (two) times daily with a  meal.     No current facility-administered medications for this visit.    Allergies:   No Known Allergies  Social History:  The patient  reports that he has never smoked. He has never used smokeless tobacco. He reports that he does not drink alcohol or use illicit drugs.   Family History  Problem Relation Age of Onset  . Stroke Father   . Peripheral vascular disease Sister   . Heart attack Brother     ROS:  Please see the history of present illness.   Denies any fevers, chills, orthopnea, PND, syncope, bleeding, rash. Prior trigeminal neuralgia noted.   All other systems reviewed and negative.   PHYSICAL EXAM: VS:  BP 122/70 mmHg  Pulse 68  Ht 6\' 1"  (1.854 m)  Wt 160 lb (72.576 kg)  BMI 21.11 kg/m2 Well nourished, well  developed, in no acute distress HEENT: normal, [place]/AT, EOMI Neck: no JVD, normal carotid upstroke, no bruit Cardiac:  normal S1, S2; RRR; no murmur Lungs:  clear to auscultation bilaterally, no wheezing, rhonchi or rales Abd: soft, nontender, no hepatomegaly, no bruits Ext: no edema, 2+ distal pulses Skin: warm and dry GU: deferred Neuro: no focal abnormalities noted, AAO x 3  EKG:  03/24/14-sinus rhythm, 68 with no other abnormalities. Subtle J-point elevation noted in lead 3, aVF.     Labs: A1c 10.2, proBNP 38, creatinine 1.09, hemoglobin 13.6  ASSESSMENT AND PLAN:  1. Dyspnea/possible angina-we will proceed with nuclear stress test, check for ischemia, check ejection fraction. Diabetes equals a coronary artery disease equivalent. Coronary artery calcification seen on CT scan. Discussed at length. Does not ultimately mean that they're flow limitations in the lumen of the coronary artery however he did have symptoms suggestive of angina. Continue with aggressive secondary prevention, statin with LDL goal of 70, diabetes control, hypertension control. Aspirin. 2. Coronary artery calcification-LAD, RCA. Checking stress test. 3. Essential hypertension-well controlled. On low-dose ACE inhibitor-diabetes. 4. Diabetes-uncontrolled with hemoglobin A1c of 10.2. He is working hard with Dr. Edrick Oh on this. 5. Family history of CAD-Brother MI in his 69s. 6. Tremor-[name]. 7. We will have follow-up with stress test, six-month follow-up regardless.  Signed, Candee Furbish, MD [facility]  03/24/2014 9:32 AM

## 2014-03-24 NOTE — Patient Instructions (Signed)
The current medical regimen is effective;  continue present plan and medications.  Your physician has requested that you have a myoview. For further information please visit [email or web address]. Please follow instruction sheet, as given.  Follow up in 6 months with Dr. Marlou Porch.  You will receive a letter in the mail 2 months before you are due.  Please call us when you receive this letter to schedule your follow up appointment.

## 2014-03-30 ENCOUNTER — Encounter (HOSPITAL_COMMUNITY): Payer: BC Managed Care – PPO

## 2014-04-01 ENCOUNTER — Ambulatory Visit (HOSPITAL_COMMUNITY): Payer: BC Managed Care – PPO | Attending: Cardiovascular Disease | Admitting: Radiology

## 2014-04-01 DIAGNOSIS — R0789 Other chest pain: Secondary | ICD-10-CM

## 2014-04-01 DIAGNOSIS — R0602 Shortness of breath: Secondary | ICD-10-CM | POA: Insufficient documentation

## 2014-04-01 DIAGNOSIS — R079 Chest pain, unspecified: Secondary | ICD-10-CM | POA: Insufficient documentation

## 2014-04-01 DIAGNOSIS — E1165 Type 2 diabetes mellitus with hyperglycemia: Secondary | ICD-10-CM

## 2014-04-01 MED ORDER — TECHNETIUM TC 99M SESTAMIBI GENERIC - CARDIOLITE
33.0000 | Freq: Once | INTRAVENOUS | Status: AC | PRN
  Administered 2014-04-01: 33 via INTRAVENOUS

## 2014-04-01 MED ORDER — TECHNETIUM TC 99M SESTAMIBI GENERIC - CARDIOLITE
11.0000 | Freq: Once | INTRAVENOUS | Status: AC | PRN
  Administered 2014-04-01: 11 via INTRAVENOUS

## 2014-04-01 NOTE — Progress Notes (Signed)
John Reese 3 NUCLEAR MED [address] [place], [place] [address] [phone]    Cardiology Nuclear Med Study  John Reese is a 63 y.o. male     MRN : [id number]     DOB: 11/23/1950  Procedure Date: 04/01/2014  Nuclear Med Background Indication for Stress Test:  Evaluation for Ischemia and [facility] 11/15 Dyspnea History:  No known CAD Cardiac Risk Factors: Hypertension and IDDM Type 2  Symptoms:  DOE and SOB   Nuclear Pre-Procedure Caffeine/Decaff Intake:  None NPO After: 6 pm   Lungs:  clear O2 Sat: 97% on room air. IV 0.9% NS with Angio Cath:  22g  IV Site: R Hand  IV Started by:  Crissie Figures, RN  Chest Size (in):  42 Cup Size: n/a  Height: 6\' 1"  (1.854 m)  Weight:  156 lb (70.761 kg)  BMI:  Body mass index is 20.59 kg/(m^2). Tech Comments:  N/A    Nuclear Med Study 1 or 2 day study: 1 day  Stress Test Type:  Stress  Reading MD: N/A  Order Authorizing Provider:  Candee Furbish, MD  Resting Radionuclide: Technetium 38m Sestamibi  Resting Radionuclide Dose: 11.0 mCi   Stress Radionuclide:  Technetium 56m Sestamibi  Stress Radionuclide Dose: 33.0 mCi           Stress Protocol Rest HR: 66 Stress HR: 141  Rest BP: 145/79 Stress BP: 184/73  Exercise Time (min): 7:00 METS: 8.5   Predicted Max HR: 155 bpm % Max HR: 90.97 bpm Rate Pressure Product: 95093   Dose of Adenosine (mg):  n/a Dose of Lexiscan: n/a mg  Dose of Atropine (mg): n/a Dose of Dobutamine: n/a mcg/kg/min (at max HR)  Stress Test Technologist: Glade Aul, BS-ES  Nuclear Technologist:  Earl Many, CNMT     Rest Procedure:  Myocardial perfusion imaging was performed at rest 45 minutes following the intravenous administration of Technetium 88m Sestamibi. Rest ECG: NSR - Normal EKG  Stress Procedure:  The patient exercised on the treadmill utilizing the Bruce Protocol for 7:00 minutes. The patient stopped due to SOB and denied any chest pain.  Technetium 31m Sestamibi was  injected at peak exercise and myocardial perfusion imaging was performed after a brief delay. Stress ECG: No significant change from baseline ECG  QPS Raw Data Images:  Patient motion noted. Stress Images:  fairly homogeneous uptake in all areas of the LV .   There are some areas of inhomogeneous uptake that is likely due to motion artifact.   Rest Images:  fairly homogeneous uptake in all areas of the LV .   There are some areas of inhomogeneous uptake that is likely due to motion artifact.  Subtraction (SDS):  No evidence of ischemia.  Transient Ischemic Dilatation (Normal <1.22):  1.00 Lung/Heart Ratio (Normal <0.45):  0.26  Quantitative Gated Spect Images QGS EDV:  101 ml QGS ESV:  43 ml  Impression Exercise Capacity:  Fair exercise capacity. BP Response:  Normal blood pressure response. Clinical Symptoms:  Mild chest pain/dyspnea. ECG Impression:  No significant ST segment change suggestive of ischemia. Comparison with Prior Nuclear Study: No images to compare  Overall Impression:  Low risk stress nuclear study .   There are some areas of inhomogeneous uptake but these appear to be due to motion artifact ( do not follow a typical pattern for CAD) .  LV Ejection Fraction: 57%.  LV Wall Motion:  NL LV Function; NL Wall Motion.   John Reese  Desmond Lope., MD, [facility] 04/01/2014, 3:11 PM [address]  [place] Pager [phone]

## 2014-04-09 ENCOUNTER — Ambulatory Visit: Payer: BC Managed Care – PPO | Admitting: Neurology

## 2014-04-20 ENCOUNTER — Ambulatory Visit (INDEPENDENT_AMBULATORY_CARE_PROVIDER_SITE_OTHER): Payer: BC Managed Care – PPO | Admitting: Neurology

## 2014-04-20 ENCOUNTER — Encounter: Payer: Self-pay | Admitting: Neurology

## 2014-04-20 VITALS — BP 122/65 | HR 77 | Ht 73.0 in | Wt 161.8 lb

## 2014-04-20 DIAGNOSIS — R251 Tremor, unspecified: Secondary | ICD-10-CM

## 2014-04-20 DIAGNOSIS — G252 Other specified forms of tremor: Secondary | ICD-10-CM

## 2014-04-20 DIAGNOSIS — G500 Trigeminal neuralgia: Secondary | ICD-10-CM

## 2014-04-20 DIAGNOSIS — G250 Essential tremor: Secondary | ICD-10-CM

## 2014-04-20 MED ORDER — GABAPENTIN 300 MG PO CAPS: 300.0000 mg | ORAL_CAPSULE | Freq: Two times a day (BID) | ORAL | Status: DC

## 2014-04-20 MED ORDER — PROPRANOLOL HCL 60 MG PO TABS: 60.0000 mg | ORAL_TABLET | Freq: Two times a day (BID) | ORAL | Status: DC

## 2014-04-20 NOTE — Patient Instructions (Signed)
Trigeminal Neuralgia  Trigeminal neuralgia is a nerve disorder that causes sudden attacks of severe facial pain. It is caused by damage to the trigeminal nerve, a major nerve in the face. It is more common in women and in the elderly, although it can also happen in younger patients. Attacks last from a few seconds to several minutes and can occur from a couple of times per year to several times per day. Trigeminal neuralgia can be a very distressing and disabling condition. Surgery may be needed in very severe cases if medical treatment does not give relief.  HOME CARE INSTRUCTIONS    If your caregiver prescribed medication to help prevent attacks, take as directed.   To help prevent attacks:   Chew on the unaffected side of the mouth.   Avoid touching your face.   Avoid blasts of hot or cold air.   Men may wish to grow a beard to avoid having to shave.  SEEK IMMEDIATE MEDICAL CARE IF:   Pain is unbearable and your medicine does not help.   You develop new, unexplained symptoms (problems).   You have problems that may be related to a medication you are taking.  Document Released: 04/06/2000 Document Revised: 07/02/2011 Document Reviewed: 02/04/2009  ExitCare Patient Information 2015 [employer]. This information is not intended to replace advice given to you by your health care provider. Make sure you discuss any questions you have with your health care provider.

## 2014-04-20 NOTE — Progress Notes (Signed)
Reason for visit: Tremor  John Reese is an 63 y.o. male  History of present illness:  Mr. Droke is a 63 year old right-handed white male with a history of a benign essential tremor and a history of trigeminal neuralgia affecting the right V2 distribution. The patient has done very well on gabapentin and carbamazepine combination. He has not had any significant discomfort over the last several months. He recently has been switched to insulin, and since being on insulin, his essential tremor has worsened. The patient will be going on Medicare at the first of the year, and he is concerned about the cost of the propranolol extended-release tablet. The patient was recently evaluated for problems with shortness of breath, and his cardiac workup was unremarkable. He returns for an evaluation. He has had recent blood work done that shows a slightly low white blood count on the CBC, and liver enzymes are unremarkable.  Past Medical History  Diagnosis Date  . Diabetes   . Dyslipidemia   . Tremor   . Degenerative arthritis   . Hypertension   . Auditory disturbance     Decreased auditory acuity  . 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 Trigeminal neuralgia     Right, V2 distribution    Past Surgical History  Procedure Laterality Date  . Inner ear surgery Right     Ear drum repair    Family History  Problem Relation Age of Onset  . Stroke Father   . Peripheral vascular disease Sister   . Heart attack Brother     Social history:  reports that he has never smoked. He has never used smokeless tobacco. He reports that he does not drink alcohol or use illicit drugs.   No Known Allergies  Medications:  Current Outpatient Prescriptions on File Prior to Visit  Medication Sig Dispense Refill  . aspirin 81 MG tablet Take 81 mg by mouth daily.    . carbamazepine (TEGRETOL) 200 MG tablet 1.5 tablets in the morning an in the evening, one  tablet at midday 360 tablet 3  . clonazePAM (KLONOPIN) 0.25 MG disintegrating tablet Take 0.25 mg by mouth 2 (two) times daily.    . fish oil-omega-3 fatty acids 1000 MG capsule Take 2 g by mouth daily.    . Insulin Glargine (TOUJEO SOLOSTAR [place]) Inject 18 Units into the skin at bedtime.    . Multiple Vitamin (MULTIVITAMIN) tablet Take 1 tablet by mouth daily.    . pravastatin (PRAVACHOL) 40 MG tablet Take 40 mg by mouth daily.    . ramipril (ALTACE) 2.5 MG capsule Take 2.5 mg by mouth daily.     No current facility-administered medications on file prior to visit.    ROS:  Out of a complete 14 system review of symptoms, the patient complains only of the following symptoms, and all other reviewed systems are negative.  Back pain Speech difficulty, tremors Confusion  Blood pressure 122/65, pulse 77, height 6\' 1"  (1.854 m), weight 161 lb 12.8 oz (73.392 kg).  Physical Exam  General: The patient is alert and cooperative at the time of the examination.  Skin: No significant peripheral edema is noted.   Neurologic Exam  Mental status: The patient is oriented x 3.  Cranial nerves: Facial symmetry is present. Speech is normal, no aphasia or dysarthria is noted. Extraocular movements are full. Visual fields are full.  Motor: The patient has good strength in all 4  extremities.  Sensory examination: Soft touch sensation is symmetric on the face, arms, and legs.  Coordination: The patient has good finger-nose-finger and heel-to-shin bilaterally. The patient has a prominent intention tremor bilaterally with finger-nose-finger.  Gait and station: The patient has a normal gait. Tandem gait is normal. Romberg is negative. No drift is seen.  Reflexes: Deep tendon reflexes are symmetric.   Assessment/Plan:  1. Benign essential tremor  2. Left trigeminal neuralgia, V2 distribution  3. Diabetes  The patient is to continue the carbamazepine and gabapentin combination. We will take him  off of the extended release propranolol, and switching to short-acting propranolol 60 mg twice daily. The patient will follow-up in about 6 months. Prescriptions were sent in for the propranolol and the gabapentin. The patient had a carbamazepine level of 5.2 six months ago.  Jill Alexanders MD 04/20/2014 10:17 PM  [facility] [address] [place], [place] [address]  Phone [phone] Fax [phone]

## 2014-05-07 ENCOUNTER — Encounter: Payer: Self-pay | Admitting: Nutrition

## 2014-05-07 ENCOUNTER — Encounter: Payer: Medicare Other | Attending: "Endocrinology | Admitting: Nutrition

## 2014-05-07 VITALS — Ht 73.0 in | Wt 167.0 lb

## 2014-05-07 DIAGNOSIS — E1165 Type 2 diabetes mellitus with hyperglycemia: Secondary | ICD-10-CM

## 2014-05-07 DIAGNOSIS — Z794 Long term (current) use of insulin: Secondary | ICD-10-CM | POA: Diagnosis not present

## 2014-05-07 DIAGNOSIS — Z713 Dietary counseling and surveillance: Secondary | ICD-10-CM | POA: Insufficient documentation

## 2014-05-07 DIAGNOSIS — IMO0002 Reserved for concepts with insufficient information to code with codable children: Secondary | ICD-10-CM

## 2014-05-07 DIAGNOSIS — E118 Type 2 diabetes mellitus with unspecified complications: Secondary | ICD-10-CM | POA: Insufficient documentation

## 2014-05-07 NOTE — Patient Instructions (Signed)
Goals:  Follow Diabetes Meal Plan as instructed  Eat 3 meals   Do not skip meals  Avoid snacks unless having a low blood sugar  Limit carbohydrate intake to 45-60 grams carbohydrate/meal  Add lean protein foods to meals/snacks  Monitor glucose levels as instructed by your doctor  Aim for 30 mins of physical activity daily  Bring food record and glucose log to your next nutrition visit  Goal: Get A1C down to 7% in three months. 2. Increase water intake to 5-20 oz bottles per day.       Take medications as prescribed.

## 2014-05-07 NOTE — Progress Notes (Signed)
  Medical Nutrition Therapy:  Appt start time: 0923 end time:  1630.   Assessment:  Primary concerns today:  Diabetes. Troujeo 18 units. And Metformin 1000 mg daily.Marland Kitchen FBS 100 -118 mg/dl . Most recent A1C was 10.2% but that was before he was put back on insulin and was in the hospital.. Wife is here.  Has lost 15-20 lbs. Regaining slowly.   Preferred Learning Style:     No preference indicated   Learning Readiness:     Ready  Change in progress   MEDICATIONS: See list   DIETARY INTAKE:  24-hr recall:  B ( AM): Sausage biscuit; Gram crackers 3 squares with pb or 6 ritz,  Snk ( AM): none  L ( PM): hash brown, crowder peas, ham, yogurt and banana, water Snk ( PM): none D ( PM): Hash brown, crowder peas, ham, yogurt Snk ( PM): none or apple Beverages: water,   Usual physical activity: walking at work  Estimated energy needs: 2000 calories 225 g carbohydrates 150 g protein 56 g fat  Progress Towards Goal(s):  In progress.   Nutritional Diagnosis:  NB-1.1 Food and nutrition-related knowledge deficit As related to Diabetes.  As evidenced by A1C >10%..    Intervention:  Nutrition Counseling and diabetes education provided on diet, CHO counting, meal planning, and complications of DM. Discussed signs/symptoms of hyper/hypoglycemia and treatment of both..  Goals:  Follow Diabetes Meal Plan as instructed  Eat 3 meals   Do not skip meals  Avoid snacks unless having a low blood sugar  Limit carbohydrate intake to 45-60 grams carbohydrate/meal  Add lean protein foods to meals/snacks  Monitor glucose levels as instructed by your doctor  Aim for 30 mins of physical activity daily  Bring food record and glucose log to your next nutrition visit  Goal: Get A1C down to 7% in three months. 2. Increase water intake to 5-20 oz bottles per day.       Take medications as prescribed.  Teaching Method Utilized:  Visual Auditory Hands on  Handouts given during visit  include: The Plate Method Carb Counting and Food Label handouts Meal Plan Card  Barriers to learning/adherence to lifestyle change: none  Demonstrated degree of understanding via:  Teach Back   Monitoring/Evaluation:  Dietary intake, exercise, meal planning, and body weight 1 to 2 month(s).

## 2014-05-25 ENCOUNTER — Other Ambulatory Visit: Payer: Self-pay | Admitting: Neurology

## 2014-05-26 NOTE — Telephone Encounter (Signed)
Rx signed and faxed.

## 2014-06-24 ENCOUNTER — Encounter: Payer: Medicare Other | Attending: Orthopedic Surgery | Admitting: Nutrition

## 2014-06-24 ENCOUNTER — Encounter: Payer: Self-pay | Admitting: Nutrition

## 2014-06-24 VITALS — Ht 73.0 in | Wt 167.0 lb

## 2014-06-24 DIAGNOSIS — IMO0002 Reserved for concepts with insufficient information to code with codable children: Secondary | ICD-10-CM

## 2014-06-24 DIAGNOSIS — E118 Type 2 diabetes mellitus with unspecified complications: Secondary | ICD-10-CM | POA: Insufficient documentation

## 2014-06-24 DIAGNOSIS — Z713 Dietary counseling and surveillance: Secondary | ICD-10-CM | POA: Diagnosis not present

## 2014-06-24 DIAGNOSIS — E1165 Type 2 diabetes mellitus with hyperglycemia: Secondary | ICD-10-CM

## 2014-06-24 DIAGNOSIS — Z794 Long term (current) use of insulin: Secondary | ICD-10-CM | POA: Insufficient documentation

## 2014-06-24 NOTE — Patient Instructions (Signed)
Goals: Keep up the [place]!!!   Follow Diabetes Meal Plan as instructed  Eat 3 meals about the same time of the day.  Do not skip meals  Avoid snacks unless having a low blood sugar  Limit carbohydrate intake to 45-60 grams carbohydrate/meal  Increase high fiber foods in diet: whole grains, high fiber cereal, fresh fruits and vegetables, whole wheat pastas  Increase lower carb vegetables to 2 servings for lunch and dinner daily.  Aim for 30 mins of physical activity daily  Bring glucose log to your next nutrition visit  Goal: Get A1C down to less than 7% in three months. 2. Increase water intake tos 5-20 oz bottles per day.       Take medications as prescribed.

## 2014-06-24 NOTE — Progress Notes (Signed)
  Medical Nutrition Therapy:  Appt start time: 1600 end time:  1630.  Assessment:  Primary concerns today:  Diabetes. Troujeo 18 units. And Metformin 1000 mg daily. Most recent  A1C was 7.1%. Met his goal of getting A1C down to about 7% in three months. BS log brought in. BS excellent. No low blood sugars. Eating three meals per day. 18 units of Troujeo, 1000 mg of Metformin BID.  Going to retire at the end of this month from [employer].   Diet is pretty good. Eating more consistently and getting regular exercise. Could benefit from more lower carb vegetables throughout the day and higher fiber foods.   Could benefit from drinking more water. Not as thirsty as he was when blood sugars were high.  Preferred Learning Style:     No preference indicated   Learning Readiness:     Ready  Change in progress   MEDICATIONS: See list   DIETARY INTAKE:  24-hr recall:  B ( AM): Gram crackers or sandwich-water, coffee Snk ( AM): none  L ( PM): left overs: Cabbage, steak, creamed potatoes, water Snk ( PM): none D ( PM):Steak, creamed potatoes, cabbage, corn, water, yogurt Snk ( PM): apple, Beverages: water   Usual physical activity: walking at work  Estimated energy needs: 2000 calories 225 g carbohydrates 150 g protein 56 g fat  Progress Towards Goal(s):  In progress.   Nutritional Diagnosis:  NB-1.1 Food and nutrition-related knowledge deficit As related to Diabetes.  As evidenced by A1C >10%..    Intervention:  Nutrition Counseling and diabetes education provided on diet, CHO counting, meal planning, and complications of DM. Discussed signs/symptoms of hyper/hypoglycemia and treatment of both..  Goals: Keep up the [place]!!!   Follow Diabetes Meal Plan as instructed  Eat 3 meals about the same time of the day.  Do not skip meals  Avoid snacks unless having a low blood sugar  Limit carbohydrate intake to 45-60 grams carbohydrate/meal  Increase high fiber foods in diet:  whole grains, high fiber cereal, fresh fruits and vegetables, whole wheat pastas  Increase lower carb vegetables to 2 servings for lunch and dinner daily.  Aim for 30 mins of physical activity daily  Bring glucose log to your next nutrition visit  Goal: Get A1C down to less than 7% in three months. 2. Increase water intake tos 5-20 oz bottles per day.       Take medications as prescribed.  Teaching Method Utilized:  Visual Auditory Hands on  Handouts given during visit include: The Plate Method Carb Counting and Food Label handouts Meal Plan Card  Barriers to learning/adherence to lifestyle change: none  Demonstrated degree of understanding via:  Teach Back   Monitoring/Evaluation:  Dietary intake, exercise, meal planning, and body weight 3  month(s).

## 2014-08-01 ENCOUNTER — Other Ambulatory Visit: Payer: Self-pay | Admitting: Neurology

## 2014-08-02 ENCOUNTER — Telehealth: Payer: Self-pay | Admitting: Neurology

## 2014-08-02 MED ORDER — PROPRANOLOL HCL 60 MG PO TABS: 60.0000 mg | ORAL_TABLET | Freq: Two times a day (BID) | ORAL | Status: DC

## 2014-08-02 MED ORDER — CARBAMAZEPINE 200 MG PO TABS: ORAL_TABLET | ORAL | Status: DC

## 2014-08-02 NOTE — Telephone Encounter (Signed)
Pt's wife is calling back stating that the pt's insurance will not cover propranolol (INDERAL) 60 MG tablet, it $180.  Is there something else he can take that does not cost this much or something his insurance will cover.  Please call and advise.

## 2014-08-02 NOTE — Telephone Encounter (Signed)
Rx's have been sent to [facility] for 90 days per patient request.  I called back to advise.  They are aware.

## 2014-08-02 NOTE — Telephone Encounter (Signed)
Pt's wife is calling requesting refills on carbamazepine (TEGRETOL) 200 MG tablet and propranolol (INDERAL) 60 MG tablet to be called into [facility] in [place] both for 90 days supply.  Please advise.

## 2014-08-02 NOTE — Telephone Encounter (Signed)
I called the pharmacy and spoke with Freada Bergeron.  She said Propranolol is not be covered by ins.  Cost for 90 day Rx is $187.  I called the patient's ins plan.  Spoke with Kelton Pillar.  She said this drug requires a prior [place].  I have provided all clinical info, request is under review.  Ref KeyBeverlyn Roux.  I called the patient back to advise.  Got no answer.  Left message.

## 2014-08-03 ENCOUNTER — Telehealth: Payer: Self-pay | Admitting: Neurology

## 2014-08-03 MED ORDER — PROPRANOLOL HCL 20 MG PO TABS: 60.0000 mg | ORAL_TABLET | Freq: Two times a day (BID) | ORAL | Status: DC

## 2014-08-03 NOTE — Telephone Encounter (Signed)
Ulice Dash from [facility] called to say that insurance would not cover propranolol (INDERAL) 60 MG tablet, they will cover 20 mg or 40 mg.  Ulice Dash states she has already spoke with member about this.  She will also fax over paperwork.

## 2014-08-03 NOTE — Telephone Encounter (Signed)
Patient's current ins plan will not pay for Propranolol 60mg  tabs, however, they will cover 20mg  tabs or 40mg  tabs.  Ins has spoken with patient about this. Can Rx be changed to covered dose?  Please advise.  Thank you.

## 2014-08-03 NOTE — Telephone Encounter (Signed)
Will call in the 20 mg tablets, taking 3 twice a day.

## 2014-09-03 ENCOUNTER — Encounter: Payer: Medicare Other | Attending: Orthopedic Surgery | Admitting: Nutrition

## 2014-09-03 VITALS — Ht 73.0 in | Wt 163.8 lb

## 2014-09-03 DIAGNOSIS — E119 Type 2 diabetes mellitus without complications: Secondary | ICD-10-CM

## 2014-09-03 DIAGNOSIS — Z713 Dietary counseling and surveillance: Secondary | ICD-10-CM | POA: Diagnosis not present

## 2014-09-03 DIAGNOSIS — E118 Type 2 diabetes mellitus with unspecified complications: Secondary | ICD-10-CM | POA: Insufficient documentation

## 2014-09-03 DIAGNOSIS — Z794 Long term (current) use of insulin: Secondary | ICD-10-CM | POA: Insufficient documentation

## 2014-09-15 NOTE — Patient Instructions (Signed)
Goals: Keep up the [place]!!!   Continue what your're doing!  Aim for 30 mins of physical activity daily       Try to limit fast foods and processed meats of bacon, sausage and biscuits due to high fat, sodium and cancer causing chemicals.       Take medications as prescribed.        Keep A1C 6.2% or less.

## 2014-09-15 NOTE — Progress Notes (Signed)
  Medical Nutrition Therapy:  Appt start time: 1100 end time:  1115  Assessment:  Primary concerns today:  Diabetes. Troujeo 15 units. And Metformin 1000 mg daily. Most recent  A1C was 7.1%. Met his goal of getting A1C down to about 7% in three months. BS log brought in. BS excellent. No low blood sugars. Eating three meals per day. He does tend to eat processed meats at breakfast. Trying to eat more lower carb veggies and fresh fruit thought. BS log brough tin FB 97-130. Doing great with blood sugars. Preferred Learning Style:     No preference indicated   Learning Readiness:     Ready  Change in progress   MEDICATIONS: See list   DIETARY INTAKE:  24-hr recall:  B ( AM): Sausage, egg and hash brown and biscuit Snk ( AM): none  L ( PM): leftovers from the night before. water Snk ( PM): none D ( PM):meat, vegetables, fruit, watert Snk ( PM): apple, Beverages: water   Usual physical activity: walking at work and walking a little more after work depending on how his back is feeling.   Estimated energy needs: 2000 calories 225 g carbohydrates 150 g protein 56 g fat  Progress Towards Goal(s):  In progress.   Nutritional Diagnosis:  NB-1.1 Food and nutrition-related knowledge deficit As related to Diabetes.  As evidenced by A1C >10%..    Intervention:  Nutrition Counseling and diabetes education provided on diet, CHO counting, meal planning, and complications of DM. Discussed signs/symptoms of hyper/hypoglycemia and treatment of both..  Goals: Keep up the [place]!!!   Continue what your're doing!  Aim for 30 mins of physical activity daily       Try to limit fast foods and processed meats of bacon, sausage and biscuits due to high fat, sodium and cancer causing chemicals.       Take medications as prescribed.        Keep A1C 6.2% or less.  Teaching Method Utilized:  Visual Auditory Hands on  Handouts given during visit include: The Plate Method Carb  Counting and Food Label handouts Meal Plan Card  Barriers to learning/adherence to lifestyle change: none  Demonstrated degree of understanding via:  Teach Back   Monitoring/Evaluation:  Dietary intake, exercise, meal planning, and body weight PRN

## 2014-10-03 ENCOUNTER — Other Ambulatory Visit: Payer: Self-pay

## 2014-10-03 MED ORDER — PROPRANOLOL HCL 20 MG PO TABS: 60.0000 mg | ORAL_TABLET | Freq: Two times a day (BID) | ORAL | Status: DC

## 2014-10-03 NOTE — Telephone Encounter (Signed)
Pharmacy requests 90 day Rx  

## 2014-10-04 ENCOUNTER — Encounter: Payer: Medicare Other | Admitting: Nutrition

## 2014-10-07 ENCOUNTER — Ambulatory Visit (INDEPENDENT_AMBULATORY_CARE_PROVIDER_SITE_OTHER): Payer: Medicare Other | Admitting: Cardiology

## 2014-10-07 ENCOUNTER — Encounter: Payer: Self-pay | Admitting: Cardiology

## 2014-10-07 VITALS — BP 130/68 | HR 59 | Ht 73.0 in | Wt 163.4 lb

## 2014-10-07 DIAGNOSIS — E785 Hyperlipidemia, unspecified: Secondary | ICD-10-CM

## 2014-10-07 DIAGNOSIS — R0789 Other chest pain: Secondary | ICD-10-CM | POA: Diagnosis not present

## 2014-10-07 DIAGNOSIS — Z8249 Family history of ischemic heart disease and other diseases of the circulatory system: Secondary | ICD-10-CM

## 2014-10-07 DIAGNOSIS — I10 Essential (primary) hypertension: Secondary | ICD-10-CM | POA: Diagnosis not present

## 2014-10-07 NOTE — Patient Instructions (Signed)
Medication Instructions:  Your physician recommends that you continue on your current medications as directed. Please refer to the Current Medication list given to you today.  Follow-Up: Follow up in 1 year with Dr. Skains.  You will receive a letter in the mail 2 months before you are due.  Please call us when you receive this letter to schedule your follow up appointment.  Thank you for choosing [facility]!!       

## 2014-10-07 NOTE — Progress Notes (Signed)
[name] [address] [place], [place]  [address] Phone: [phone] Fax:  [phone]  Date:  10/07/2014   ID:  John Reese, DOB 1950/09/17, MRN [id number]  PCP:  [name], MD   History of Present Illness: John Reese is a 64 y.o. male here for follow-up of shortness of breath having been seen previously in the emergency department on 02/23/14. He underwent nuclear stress test that was reassuring. Coronary cusp location was noted on CT scan of RCA and LAD. His symptoms at that time were moderate in severity over 3 days duration intermittent worse in the mornings and worse with activity. He has a history of diabetes, hyperlipidemia, hypertension. Nonsmoker. Father had stroke. Brother had heart attack 56. Mother pacer.  First time was deer hunting, coming out woods, could not catch breath. Second time at work. Works were also slurring. Felt some tightness in chest. Monday OK.   CT scan was performed and was negative for pulmonary embolism. Chest x-ray benign. CT scan did however show mid LAD calcification of coronary artery as well as proximal RCA calcification.   Wt Readings from Last 3 Encounters:  10/07/14 163 lb 6.4 oz (74.118 kg)  09/15/14 163 lb 12.8 oz (74.299 kg)  06/24/14 167 lb (75.751 kg)     Past Medical History  Diagnosis Date  . Diabetes   . Dyslipidemia   . Tremor   . Degenerative arthritis   . Hypertension   . Auditory disturbance     Decreased auditory acuity  . 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 Trigeminal neuralgia     Right, V2 distribution    Past Surgical History  Procedure Laterality Date  . Inner ear surgery Right     Ear drum repair    Current Outpatient Prescriptions  Medication Sig Dispense Refill  . aspirin 81 MG tablet Take 81 mg by mouth daily.    . carbamazepine (TEGRETOL) 200 MG tablet 1.5 tablets in the morning an in the evening, one tablet at midday 360  tablet 0  . clonazePAM (KLONOPIN) 0.25 MG disintegrating tablet DISSOLVE 1 TABLET BY MOUTH TWICE A DAY 60 tablet 5  . fish oil-omega-3 fatty acids 1000 MG capsule Take 2 g by mouth daily.    Marland Kitchen gabapentin (NEURONTIN) 300 MG capsule TAKE 1 CAPSULE TWICE A DAY 180 capsule 0  . Insulin Glargine (TOUJEO SOLOSTAR [place]) Inject 15 Units into the skin at bedtime.     . metFORMIN (GLUCOPHAGE) 1000 MG tablet Take 1,000 mg by mouth 2 (two) times daily.    . Multiple Vitamin (MULTIVITAMIN) tablet Take 1 tablet by mouth daily.    . pravastatin (PRAVACHOL) 40 MG tablet Take 40 mg by mouth daily.    . propranolol (INDERAL) 20 MG tablet Take 3 tablets (60 mg total) by mouth 2 (two) times daily. 540 tablet 0  . ramipril (ALTACE) 2.5 MG capsule Take 2.5 mg by mouth daily.     No current facility-administered medications for this visit.    Allergies:   No Known Allergies  Social History:  The patient  reports that he has never smoked. He has never used smokeless tobacco. He reports that he does not drink alcohol or use illicit drugs.   Family History  Problem Relation Age of Onset  . Stroke Father   . Peripheral vascular disease Sister   . Heart attack Brother     ROS:  Please see the history of present illness.   Denies any fevers, chills, orthopnea, PND, syncope, bleeding, rash. Prior trigeminal neuralgia noted.   All other systems reviewed and negative.   PHYSICAL EXAM: VS:  BP 130/68 mmHg  Pulse 59  Ht 6\' 1"  (1.854 m)  Wt 163 lb 6.4 oz (74.118 kg)  BMI 21.56 kg/m2 Well nourished, well developed, in no acute distress HEENT: normal, [place]/AT, EOMI Neck: no JVD, normal carotid upstroke, no bruit Cardiac:  normal S1, S2; RRR; no murmur Lungs:  clear to auscultation bilaterally, no wheezing, rhonchi or rales Abd: soft, nontender, no hepatomegaly, no bruits Ext: no edema, 2+ distal pulses Skin: warm and dry GU: deferred Neuro: no focal abnormalities noted, AAO x 3  EKG:  03/24/14-sinus rhythm, 68  with no other abnormalities. Subtle J-point elevation noted in lead 3, aVF.     Labs: A1c 10.2, proBNP 38, creatinine 1.09, hemoglobin 13.6  Nuclear stress test: 04/02/14-overall reassuring.  ASSESSMENT AND PLAN:  1. Dyspnea- improved symptoms, nuclear stress test in December 2015 was reassuring. Normal ejection fraction. He walks daily in the morning. Diabetes equals a coronary artery disease equivalent. Coronary artery calcification seen on CT scan. Discussed at length. Continue with aggressive secondary prevention, statin with LDL goal of 70, diabetes control, hypertension control. Aspirin. 2. Coronary artery calcification-LAD, RCA. Discuss coronary atherosclerosis  3. Hyperlipidemia-on pravastatin. Excellent. 4. Essential hypertension-well controlled. On low-dose ACE inhibitor-diabetes. 5. Diabetes-uncontrolled with hemoglobin A1c of 10.2. He is working hard with Dr. Edrick Oh on this. 6. Family history of CAD-Brother MI in his 67s. 7. Tremor-[name]. 8. We will have follow-up with stress test, 1 year follow-up regardless.  Signed, Candee Furbish, MD [facility]  10/07/2014 8:17 AM

## 2014-10-12 ENCOUNTER — Ambulatory Visit (INDEPENDENT_AMBULATORY_CARE_PROVIDER_SITE_OTHER): Payer: Medicare Other | Admitting: Neurology

## 2014-10-12 ENCOUNTER — Encounter: Payer: Self-pay | Admitting: Neurology

## 2014-10-12 VITALS — BP 131/79 | HR 62 | Ht 73.0 in | Wt 162.6 lb

## 2014-10-12 DIAGNOSIS — G500 Trigeminal neuralgia: Secondary | ICD-10-CM

## 2014-10-12 DIAGNOSIS — R251 Tremor, unspecified: Secondary | ICD-10-CM | POA: Diagnosis not present

## 2014-10-12 DIAGNOSIS — G252 Other specified forms of tremor: Secondary | ICD-10-CM

## 2014-10-12 DIAGNOSIS — G250 Essential tremor: Secondary | ICD-10-CM

## 2014-10-12 MED ORDER — PROPRANOLOL HCL 40 MG PO TABS: 80.0000 mg | ORAL_TABLET | Freq: Two times a day (BID) | ORAL | Status: DC

## 2014-10-12 NOTE — Patient Instructions (Signed)

## 2014-10-12 NOTE — Progress Notes (Signed)
Reason for visit: Tremor  John Reese is an 64 y.o. male  History of present illness:  John Reese is a 64 year old right-handed white male with a history of trigeminal neuralgia in the right V2 distribution and an essential tremor. The patient is now on propranolol taking 60 mg twice daily, he is tolerating medication well. He is noting some gradual increase in the tremor over time, and the tremor now affects his right leg at times when he is driving. The patient has difficulty with feeding himself and with handwriting because of the tremor. He denies any falls, he indicates that his diabetes is under better control. He returns to the office today for an evaluation. He denies any other significant medical issues that have come up since last seen. He will have occasional twinges of trigeminal neuralgia pain, but no significant discomfort is noted.  Past Medical History  Diagnosis Date  . Diabetes   . Dyslipidemia   . Tremor   . Degenerative arthritis   . Hypertension   . Auditory disturbance     Decreased auditory acuity  . 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 Trigeminal neuralgia     Right, V2 distribution    Past Surgical History  Procedure Laterality Date  . Inner ear surgery Right     Ear drum repair    Family History  Problem Relation Age of Onset  . Stroke Father   . Peripheral vascular disease Sister   . Heart attack Brother     Social history:  reports that he has never smoked. He has never used smokeless tobacco. He reports that he does not drink alcohol or use illicit drugs.   No Known Allergies  Medications:  Prior to Admission medications   Medication Sig Start Date End Date Taking? Authorizing Provider  aspirin 81 MG tablet Take 81 mg by mouth daily.   Yes Historical Provider, MD  carbamazepine (TEGRETOL) 200 MG tablet 1.5 tablets in the morning an in the evening, one tablet at midday 08/02/14  Yes Kathrynn Ducking, MD  CINNAMON PO Take 1,000 mg by mouth daily.   Yes Historical Provider, MD  clonazePAM (KLONOPIN) 0.25 MG disintegrating tablet DISSOLVE 1 TABLET BY MOUTH TWICE A DAY 05/26/14  Yes Kathrynn Ducking, MD  fish oil-omega-3 fatty acids 1000 MG capsule Take 2 g by mouth daily.   Yes Historical Provider, MD  gabapentin (NEURONTIN) 300 MG capsule TAKE 1 CAPSULE TWICE A DAY 08/02/14  Yes Kathrynn Ducking, MD  Insulin Glargine (TOUJEO SOLOSTAR [place]) Inject 15 Units into the skin at bedtime.    Yes Historical Provider, MD  metFORMIN (GLUCOPHAGE) 1000 MG tablet Take 1,000 mg by mouth 2 (two) times daily. 02/24/14  Yes Historical Provider, MD  Multiple Vitamin (MULTIVITAMIN) tablet Take 1 tablet by mouth daily.   Yes Historical Provider, MD  pravastatin (PRAVACHOL) 40 MG tablet Take 40 mg by mouth daily.   Yes Historical Provider, MD  propranolol (INDERAL) 20 MG tablet Take 3 tablets (60 mg total) by mouth 2 (two) times daily. 10/03/14  Yes Kathrynn Ducking, MD  ramipril (ALTACE) 2.5 MG capsule Take 2.5 mg by mouth daily.   Yes Historical Provider, MD    ROS:  Out of a complete 14 system review of symptoms, the patient complains only of the following symptoms, and all other reviewed systems are negative.  Tremor  Blood pressure 131/79, pulse 62, height 6\' 1"  (  1.854 m), weight 162 lb 9.6 oz (73.755 kg).  Physical Exam  General: The patient is alert and cooperative at the time of the examination.  Skin: No significant peripheral edema is noted.   Neurologic Exam  Mental status: The patient is alert and oriented x 3 at the time of the examination. The patient has apparent normal recent and remote memory, with an apparently normal attention span and concentration ability.   Cranial nerves: Facial symmetry is present. Speech is normal, no aphasia or dysarthria is noted. Extraocular movements are full. Visual fields are full.  Motor: The patient has good strength in all 4 extremities.  Sensory  examination: Soft touch sensation is symmetric on the face, arms, and legs.  Coordination: The patient has good finger-nose-finger and heel-to-shin bilaterally.  Gait and station: The patient has a normal gait. Tandem gait is normal. Romberg is negative. No drift is seen.  Reflexes: Deep tendon reflexes are symmetric.   Assessment/Plan:  1. Trigeminal neuralgia, right V2 distribution  2. Benign essential tremor  The patient is doing relatively well this point, the tremor is gradually worsening. The propranolol will be increased taking 80 mg twice daily. A prescription was called in. The patient will follow-up in 6 months. The last documented blood work for comprehensive metabolic profile and for CBC was in November 2015.  John Alexanders MD 10/12/2014 8:44 PM  [facility] [address] [place], [place] [address]  Phone [phone] Fax [phone]

## 2014-10-20 ENCOUNTER — Ambulatory Visit: Payer: BC Managed Care – PPO | Admitting: Neurology

## 2014-10-29 ENCOUNTER — Other Ambulatory Visit: Payer: Self-pay | Admitting: Neurology

## 2014-11-24 ENCOUNTER — Other Ambulatory Visit: Payer: Self-pay | Admitting: Neurology

## 2014-11-25 NOTE — Telephone Encounter (Signed)
Rx signed and faxed.

## 2015-03-28 LAB — HEMOGLOBIN A1C: HEMOGLOBIN A1C: 6.6 % — AB (ref 4.0–6.0)

## 2015-04-04 ENCOUNTER — Other Ambulatory Visit: Payer: Self-pay | Admitting: Neurology

## 2015-04-08 ENCOUNTER — Encounter: Payer: Self-pay | Admitting: "Endocrinology

## 2015-04-08 ENCOUNTER — Ambulatory Visit (INDEPENDENT_AMBULATORY_CARE_PROVIDER_SITE_OTHER): Payer: Medicare Other | Admitting: "Endocrinology

## 2015-04-08 VITALS — BP 119/74 | HR 73 | Ht 71.0 in | Wt 163.0 lb

## 2015-04-08 DIAGNOSIS — E785 Hyperlipidemia, unspecified: Secondary | ICD-10-CM | POA: Diagnosis not present

## 2015-04-08 DIAGNOSIS — I10 Essential (primary) hypertension: Secondary | ICD-10-CM | POA: Diagnosis not present

## 2015-04-08 DIAGNOSIS — IMO0001 Reserved for inherently not codable concepts without codable children: Secondary | ICD-10-CM

## 2015-04-08 DIAGNOSIS — Z794 Long term (current) use of insulin: Secondary | ICD-10-CM | POA: Diagnosis not present

## 2015-04-08 DIAGNOSIS — E1165 Type 2 diabetes mellitus with hyperglycemia: Secondary | ICD-10-CM | POA: Diagnosis not present

## 2015-04-08 MED ORDER — METFORMIN HCL 500 MG PO TABS: 500.0000 mg | ORAL_TABLET | Freq: Two times a day (BID) | ORAL | Status: DC

## 2015-04-08 NOTE — Patient Instructions (Signed)

## 2015-04-08 NOTE — Progress Notes (Signed)
Subjective:    Patient ID: John Reese, male    DOB: [birth year], PCP [name], MD   Past Medical History  Diagnosis Date  . Diabetes ([place])   . Dyslipidemia   . Tremor   . Degenerative arthritis   . Hypertension   . Auditory disturbance     Decreased auditory acuity  . 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 Trigeminal neuralgia     Right, V2 distribution   Past Surgical History  Procedure Laterality Date  . Inner ear surgery Right     Ear drum repair   Social History   Social History  . Marital Status: Married    Spouse Name: N/A  . Number of Children: 2  . Years of Education: HS   Occupational History  . Works in Theatre manager Other   Social History Main Topics  . Smoking status: Never Smoker   . Smokeless tobacco: Never Used  . Alcohol Use: No  . Drug Use: No  . Sexual Activity: Not Asked   Other Topics Concern  . None   Social History Narrative   Patient is right handed.   Patient drinks 2 cups coffee daily.   Outpatient Encounter Prescriptions as of 04/08/2015  Medication Sig  . aspirin 81 MG tablet Take 81 mg by mouth daily.  . carbamazepine (TEGRETOL) 200 MG tablet TAKE 1 AND 1/2 TABLETS IN THE MORNING, 1 TABLET AT MIDDAY AND 1 AND 1/2 TABLETS IN THE EVENING  . CINNAMON PO Take 1,000 mg by mouth daily.  . clonazePAM (KLONOPIN) 0.25 MG disintegrating tablet DISSOLVE 1 TABLET BY MOUTH TWICE DAILY  . fish oil-omega-3 fatty acids 1000 MG capsule Take 2 g by mouth daily.  Marland Kitchen gabapentin (NEURONTIN) 300 MG capsule TAKE 1 CAPSULE TWICE A DAY  . Insulin Glargine (TOUJEO SOLOSTAR [place]) Inject 15 Units into the skin at bedtime.   . Multiple Vitamin (MULTIVITAMIN) tablet Take 1 tablet by mouth daily.  . pravastatin (PRAVACHOL) 40 MG tablet Take 40 mg by mouth daily.  . propranolol (INDERAL) 40 MG tablet TAKE 2 TABLETS (80 MG TOTAL) BY MOUTH 2 (TWO) TIMES DAILY.  . ramipril (ALTACE) 2.5 MG capsule Take  2.5 mg by mouth daily.  . [DISCONTINUED] metFORMIN (GLUCOPHAGE) 1000 MG tablet Take 1,000 mg by mouth 2 (two) times daily.  . metFORMIN (GLUCOPHAGE) 500 MG tablet Take 1 tablet (500 mg total) by mouth 2 (two) times daily with a meal.   No facility-administered encounter medications on file as of 04/08/2015.   ALLERGIES: No Known Allergies VACCINATION STATUS:  There is no immunization history on file for this patient.  Diabetes He presents for his follow-up diabetic visit. He has type 2 diabetes mellitus. Onset time: He was diagnosed at approximate age of 48 years. His disease course has been improving. There are no hypoglycemic associated symptoms. Pertinent negatives for hypoglycemia include no confusion, headaches, pallor or seizures. There are no diabetic associated symptoms. Pertinent negatives for diabetes include no chest pain, no fatigue, no polydipsia, no polyphagia, no polyuria and no weakness. There are no hypoglycemic complications. Symptoms are improving. There are no diabetic complications. Risk factors for coronary artery disease include diabetes mellitus, dyslipidemia, hypertension, male sex and sedentary lifestyle. Current diabetic treatment includes insulin injections and oral agent (monotherapy). He is compliant with treatment most of the time. His weight is stable. He is following a generally healthy diet. When asked about meal planning, he reported none.  He has had a previous visit with a dietitian. He participates in exercise intermittently. His home blood glucose trend is decreasing steadily. His breakfast blood glucose range is generally 140-180 mg/dl. An ACE inhibitor/angiotensin II receptor blocker is being taken.  Hyperlipidemia This is a chronic problem. The current episode started more than 1 year ago. Pertinent negatives include no chest pain, myalgias or shortness of breath. Risk factors for coronary artery disease include diabetes mellitus, dyslipidemia, hypertension,  male sex and a sedentary lifestyle.  Hypertension This is a chronic problem. The current episode started more than 1 year ago. Pertinent negatives include no chest pain, headaches, neck pain, palpitations or shortness of breath. Risk factors for coronary artery disease include dyslipidemia and diabetes mellitus. Past treatments include ACE inhibitors.     Review of Systems  Constitutional: Negative for fatigue and unexpected weight change.  HENT: Negative for dental problem, mouth sores and trouble swallowing.   Eyes: Negative for visual disturbance.  Respiratory: Negative for cough, choking, chest tightness, shortness of breath and wheezing.   Cardiovascular: Negative for chest pain, palpitations and leg swelling.  Gastrointestinal: Negative for nausea, vomiting, abdominal pain, diarrhea, constipation and abdominal distention.  Endocrine: Negative for polydipsia, polyphagia and polyuria.  Genitourinary: Negative for dysuria, urgency, hematuria and flank pain.  Musculoskeletal: Negative for myalgias, back pain, gait problem and neck pain.  Skin: Negative for pallor, rash and wound.  Neurological: Negative for seizures, syncope, weakness, numbness and headaches.  Psychiatric/Behavioral: Negative.  Negative for confusion and dysphoric mood.    Objective:    BP 119/74 mmHg  Pulse 73  Ht 5\' 11"  (1.803 m)  Wt 163 lb (73.936 kg)  BMI 22.74 kg/m2  SpO2 94%  Wt Readings from Last 3 Encounters:  04/08/15 163 lb (73.936 kg)  10/12/14 162 lb 9.6 oz (73.755 kg)  10/07/14 163 lb 6.4 oz (74.118 kg)    Physical Exam  Constitutional: He is oriented to person, place, and time. He appears well-developed and well-nourished. He is cooperative. No distress.  HENT:  Head: Normocephalic and atraumatic.  Eyes: EOM are normal.  Neck: Normal range of motion. Neck supple. No tracheal deviation present. No thyromegaly present.  Cardiovascular: Normal rate, S1 normal, S2 normal and normal heart sounds.   Exam reveals no gallop.   No murmur heard. Pulses:      Dorsalis pedis pulses are 2+ on the right side, and 2+ on the left side.       Posterior tibial pulses are 2+ on the right side, and 2+ on the left side.  Pulmonary/Chest: Breath sounds normal. No respiratory distress. He has no wheezes.  Abdominal: Soft. Bowel sounds are normal. He exhibits no distension. There is no tenderness. There is no guarding and no CVA tenderness.  Musculoskeletal: He exhibits no edema.       Right shoulder: He exhibits no swelling and no deformity.  Neurological: He is alert and oriented to person, place, and time. He has normal strength and normal reflexes. No cranial nerve deficit or sensory deficit. Gait normal.  Skin: Skin is warm and dry. No rash noted. No cyanosis. Nails show no clubbing.  Psychiatric: He has a normal mood and affect. His speech is normal and behavior is normal. Judgment and thought content normal. Cognition and memory are normal.    Results for orders placed or performed in visit on 04/08/15  Hemoglobin A1c  Result Value Ref Range   Hgb A1c MFr Bld 6.6 (A) 4.0 - 6.0 %  Complete Blood Count (Most recent): Lab Results  Component Value Date   WBC 3.7* 02/23/2014   HGB 13.6 02/23/2014   HCT 39.7 02/23/2014   MCV 86.3 02/23/2014   PLT 157 02/23/2014   Chemistry (most recent): Lab Results  Component Value Date   NA 137 02/23/2014   K 5.6* 02/23/2014   CL 98 02/23/2014   CO2 26 02/23/2014   BUN 26* 02/23/2014   CREATININE 1.09 02/23/2014   Diabetic Labs (most recent): Lab Results  Component Value Date   HGBA1C 6.6* 03/28/2015     Assessment & Plan:   1. Uncontrolled type 2 diabetes mellitus without complication, with long-term current use of insulin (HCC) -No gross complications so far, however patient remains at a high risk for more acute and chronic complications of diabetes which include CAD, CVA, CKD, retinopathy, and neuropathy. These are all discussed in detail  with the patient.  Patient came with improved and controlled fasting glucose profile, and  recent A1c of 6.6% generally improving from 10.5 %.  Glucose logs and insulin administration records pertaining to this visit,  to be scanned into patient's records.  Recent labs reviewed.   - I have re-counseled the patient on diet management   by adopting a carbohydrate restricted / protein rich  Diet.  - Suggestion is made for patient to avoid simple carbohydrates   from their diet including Cakes , Desserts, Ice Cream,  Soda (  diet and regular) , Sweet Tea , Candies,  Chips, Cookies, Artificial Sweeteners,   and "Sugar-free" Products .  This will help patient to have stable blood glucose profile and potentially avoid unintended  Weight gain.  - Patient is advised to stick to a routine mealtimes to eat 3 meals  a day and avoid unnecessary snacks ( to snack only to correct hypoglycemia).  - The patient  has been  scheduled with Jearld Fenton, RDN, CDE for individualized DM education.  - I have approached patient with the following individualized plan to manage diabetes and patient agrees.  he will need basal insulin long term, currently Toujeo 15 units daily at bedtime , associated with monitoring BG at least at fasting.  -I will lower his metformin to 500 mg by mouth twice a day. -he is allowed to increase his healthy carbs intake  to avoid unintended weight loss. - his c-peptide is critically low at 0.5, indicating pancreatic exhaustion.  - His GAD antibodies are negative, however does not rule out a possibility of LADA. -he would not need prandial insulin for now.  He is not a candidate for incretin therapy.  - Patient specific target  for A1c; LDL, HDL, Triglycerides, and  Waist Circumference were discussed in detail.  2) BP/HTN:  controlled. Continue current medications including ACEI/ARB. 3) Lipids/HPL:  continue statins. 4)  Weight/Diet: CDE consult in progress, exercise, and carbohydrates  information provided.  5) Chronic Care/Health Maintenance:  -Patient is on ACEI/ARB and Statin medications and encouraged to continue to follow up with Ophthalmology, Podiatrist at least yearly or according to recommendations, and advised to  stay away from smoking. I have recommended yearly flu vaccine and pneumonia vaccination at least every 5 years; moderate intensity exercise for up to 150 minutes weekly; and  sleep for at least 7 hours a day.  - 25 minutes of time was spent on the care of this patient , 50% of which was applied for counseling on diabetes complications and their preventions.  - I advised patient to maintain close  follow up with [name], MD for primary care needs.  Patient is asked to bring meter and  blood glucose logs during their next visit.   Follow up plan: -Return in about 3 months (around 07/07/2015) for diabetes, high blood pressure, high cholesterol, follow up with pre-visit labs, meter, and logs.  John Jakari, MD Phone: [phone]  Fax: [phone]   04/08/2015, 1:42 PM

## 2015-04-11 ENCOUNTER — Other Ambulatory Visit: Payer: Self-pay | Admitting: Neurology

## 2015-04-13 ENCOUNTER — Ambulatory Visit (INDEPENDENT_AMBULATORY_CARE_PROVIDER_SITE_OTHER): Payer: Medicare Other | Admitting: Neurology

## 2015-04-13 ENCOUNTER — Encounter: Payer: Self-pay | Admitting: Neurology

## 2015-04-13 VITALS — BP 124/69 | HR 66 | Ht 73.0 in | Wt 163.0 lb

## 2015-04-13 DIAGNOSIS — Z5181 Encounter for therapeutic drug level monitoring: Secondary | ICD-10-CM

## 2015-04-13 DIAGNOSIS — G250 Essential tremor: Secondary | ICD-10-CM | POA: Diagnosis not present

## 2015-04-13 DIAGNOSIS — G500 Trigeminal neuralgia: Secondary | ICD-10-CM | POA: Diagnosis not present

## 2015-04-13 NOTE — Progress Notes (Signed)
Reason for visit: Essential tremor  John Reese is an 64 y.o. male  History of present illness:  [name] is a 64 year old right-handed white male with a history of an essential tremor affecting both upper extremities. The patient has trigeminal neuralgia affecting the right V2 distribution. The tremor has continued to be a problem, he was increased on the propranolol when last seen, but this offered minimal benefit. The patient essentially is unable to perform handwriting, he has had significant difficulty feeding himself. The patient does have some troubles with slurred speech at times, likely associated with use of medications. The patient has had some mild cognitive slowing as well. He has retired at this point. The patient has not had much issue with his trigeminal neuralgia, he had a brief bout of pain 3 months ago, but this has resolved. The patient denies any significant issues with balance, no falls. He returns to the office today for an evaluation.  Past Medical History  Diagnosis Date  . Diabetes ([place])   . Dyslipidemia   . Tremor   . Degenerative arthritis   . Hypertension   . Auditory disturbance     Decreased auditory acuity  . 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 Trigeminal neuralgia     Right, V2 distribution    Past Surgical History  Procedure Laterality Date  . Inner ear surgery Right     Ear drum repair    Family History  Problem Relation Age of Onset  . Stroke Father   . Peripheral vascular disease Sister   . Heart attack Brother   . Stroke Mother     Social history:  reports that he has never smoked. He has never used smokeless tobacco. He reports that he does not drink alcohol or use illicit drugs.   No Known Allergies  Medications:  Prior to Admission medications   Medication Sig Start Date End Date Taking? Authorizing Provider  aspirin 81 MG tablet Take 81 mg by mouth daily.   Yes Historical  Provider, MD  carbamazepine (TEGRETOL) 200 MG tablet TAKE 1 AND 1/2 TABLETS IN THE MORNING, 1 TABLET AT MIDDAY AND 1 AND 1/2 TABLETS IN THE EVENING 04/11/15  Yes Kathrynn Ducking, MD  CINNAMON PO Take 1,000 mg by mouth daily.   Yes Historical Provider, MD  clonazePAM (KLONOPIN) 0.25 MG disintegrating tablet DISSOLVE 1 TABLET BY MOUTH TWICE DAILY 11/24/14  Yes Kathrynn Ducking, MD  fish oil-omega-3 fatty acids 1000 MG capsule Take 2 g by mouth daily.   Yes Historical Provider, MD  gabapentin (NEURONTIN) 300 MG capsule TAKE 1 CAPSULE TWICE A DAY 10/29/14  Yes Kathrynn Ducking, MD  Insulin Glargine (TOUJEO SOLOSTAR [place]) Inject 15 Units into the skin at bedtime.    Yes Historical Provider, MD  metFORMIN (GLUCOPHAGE) 500 MG tablet Take 1 tablet (500 mg total) by mouth 2 (two) times daily with a meal. 04/08/15  Yes Cassandria Anger, MD  Multiple Vitamin (MULTIVITAMIN) tablet Take 1 tablet by mouth daily.   Yes Historical Provider, MD  pravastatin (PRAVACHOL) 40 MG tablet Take 40 mg by mouth daily.   Yes Historical Provider, MD  propranolol (INDERAL) 40 MG tablet TAKE 2 TABLETS (80 MG TOTAL) BY MOUTH 2 (TWO) TIMES DAILY. 04/04/15  Yes Kathrynn Ducking, MD  ramipril (ALTACE) 2.5 MG capsule Take 2.5 mg by mouth daily.   Yes Historical Provider, MD    ROS:  Out  of a complete 14 system review of symptoms, the patient complains only of the following symptoms, and all other reviewed systems are negative.  Tremors  Blood pressure 124/69, pulse 66, height 6\' 1"  (1.854 m), weight 163 lb (73.936 kg).  Physical Exam  General: The patient is alert and cooperative at the time of the examination.  Skin: No significant peripheral edema is noted.   Neurologic Exam  Mental status: The patient is alert and oriented x 3 at the time of the examination. The patient has apparent normal recent and remote memory, with an apparently normal attention span and concentration ability.   Cranial nerves: Facial symmetry  is present. Speech is normal, no aphasia or dysarthria is noted. Extraocular movements are full. Visual fields are full. Intermittent side-to-side head tremor is seen.  Motor: The patient has good strength in all 4 extremities.  Sensory examination: Soft touch sensation is symmetric on the face, arms, and legs.  Coordination: The patient has good finger-nose-finger and heel-to-shin bilaterally. Prominent intention tremors are noted with finger-nose-finger bilaterally.  Gait and station: The patient has a normal gait. Tandem gait is normal. Romberg is negative. No drift is seen.  Reflexes: Deep tendon reflexes are symmetric.   Assessment/Plan:  1. Essential tremor  2. Right trigeminal neuralgia, V2 distribution  3. Reported mild memory disturbance  The patient may be having some cognitive side effects from the use of medications. The patient is on carbamazepine and gabapentin for the neuralgia issue, but this is controlling his pain. He is on propranolol for the tremor without much benefit. I discussed the possibility of considering a deep brain stimulator, he does not wish to consider this option at this time. He will have blood work done today, he will follow-up in 6 months.  Jill Alexanders MD 04/13/2015 10:23 AM  [facility] [address] [place], [place] [address]  Phone [phone] Fax [phone]

## 2015-04-13 NOTE — Patient Instructions (Signed)
Tremor °A tremor is trembling or shaking that you cannot control. Most tremors affect the hands or arms. Tremors can also affect the head, vocal cords, face, and other parts of the body. There are many types of tremors. Common types include:  °· Essential tremor. These usually occur in people over the age of 40. It may run in families and can happen in otherwise healthy people.   °· Resting tremor. These occur when the muscles are at rest, such as when your hands are resting in your lap. People with Parkinson disease often have resting tremors.   °· Postural tremor. These occur when you try to hold a pose, such as keeping your hands outstretched.   °· Kinetic tremor. These occur during purposeful movement, such as trying to touch a finger to your nose.   °· Task-specific tremor. These may occur when you perform tasks such as handwriting, speaking, or standing.   °· Psychogenic tremor. These dramatically lessen or disappear when you are distracted. They can happen in people of all ages.   °Some types of tremors have no known cause. Tremors can also be a symptom of nervous system problems (neurological disorders) that may occur with aging. Some tremors go away with treatment while others do not.  °HOME CARE INSTRUCTIONS °Watch your tremor for any changes. The following actions may help to lessen any discomfort you are feeling: °· Take medicines only as directed by your health care provider.   °· Limit alcohol intake to no more than 1 drink per day for nonpregnant women and 2 drinks per day for men. One drink equals 12 oz of beer, 5 oz of wine, or 1½ oz of hard liquor. °· Do not use any tobacco products, including cigarettes, chewing tobacco, or electronic cigarettes. If you need help quitting, ask your health care provider.   °· Avoid extreme heat or cold.    °· Limit the amount of caffeine you consume as directed by your health care provider.   °· Try to get 8 hours of sleep each night. °· Find ways to manage your  stress, such as meditation or yoga. °· Keep all follow-up visits as directed by your health care provider. This is important. °SEEK MEDICAL CARE IF: °· You start having a tremor after starting a new medicine. °· You have tremor with other symptoms such as: °¨ Numbness. °¨ Tingling. °¨ Pain. °¨ Weakness. °· Your tremor gets worse. °· Your tremor interferes with your day-to-day life. °  °This information is not intended to replace advice given to you by your health care provider. Make sure you discuss any questions you have with your health care provider. °  °Document Released: 03/30/2002 Document Revised: 04/30/2014 Document Reviewed: 10/05/2013 °Elsevier Interactive Patient Education ©2016 [employer] ° °

## 2015-04-15 ENCOUNTER — Telehealth: Payer: Self-pay | Admitting: Neurology

## 2015-04-15 LAB — COMPREHENSIVE METABOLIC PANEL
A/G RATIO: 2.2 (ref 1.1–2.5)
ALBUMIN: 4.1 g/dL (ref 3.6–4.8)
ALT: 11 IU/L (ref 0–44)
AST: 12 IU/L (ref 0–40)
Alkaline Phosphatase: 48 IU/L (ref 39–117)
BUN / CREAT RATIO: 19 (ref 10–22)
BUN: 17 mg/dL (ref 8–27)
CHLORIDE: 92 mmol/L — AB (ref 96–106)
CO2: 25 mmol/L (ref 18–29)
Calcium: 9.3 mg/dL (ref 8.6–10.2)
Creatinine, Ser: 0.91 mg/dL (ref 0.76–1.27)
GFR, EST AFRICAN AMERICAN: 101 mL/min/{1.73_m2} (ref >59)
GFR, EST NON AFRICAN AMERICAN: 88 mL/min/{1.73_m2} (ref >59)
Globulin, Total: 1.9 g/dL (ref 1.5–4.5)
Glucose: 124 mg/dL — ABNORMAL HIGH (ref 65–99)
POTASSIUM: 4.7 mmol/L (ref 3.5–5.2)
Sodium: 131 mmol/L — ABNORMAL LOW (ref 134–144)
TOTAL PROTEIN: 6 g/dL (ref 6.0–8.5)

## 2015-04-15 LAB — CBC WITH DIFFERENTIAL/PLATELET
BASOS ABS: 0 10*3/uL (ref 0.0–0.2)
BASOS: 1 %
EOS (ABSOLUTE): 0.1 10*3/uL (ref 0.0–0.4)
Eos: 3 %
HEMOGLOBIN: 12.4 g/dL — AB (ref 12.6–17.7)
Hematocrit: 36.7 % — ABNORMAL LOW (ref 37.5–51.0)
Immature Grans (Abs): 0 10*3/uL (ref 0.0–0.1)
Immature Granulocytes: 0 %
LYMPHS ABS: 1.5 10*3/uL (ref 0.7–3.1)
Lymphs: 35 %
MCH: 29.8 pg (ref 26.6–33.0)
MCHC: 33.8 g/dL (ref 31.5–35.7)
MCV: 88 fL (ref 79–97)
MONOCYTES: 9 %
Monocytes Absolute: 0.4 10*3/uL (ref 0.1–0.9)
NEUTROS ABS: 2.2 10*3/uL (ref 1.4–7.0)
Neutrophils: 52 %
Platelets: 197 10*3/uL (ref 150–379)
RBC: 4.16 x10E6/uL (ref 4.14–5.80)
RDW: 13.2 % (ref 12.3–15.4)
WBC: 4.2 10*3/uL (ref 3.4–10.8)

## 2015-04-15 LAB — CARBAMAZEPINE LEVEL, TOTAL: CARBAMAZEPINE LVL: 8.4 ug/mL (ref 4.0–12.0)

## 2015-04-15 NOTE — Telephone Encounter (Signed)
I called the patient, talk with the wife. The blood work showed a sodium level of 131. The hemoglobin is also slightly low. These are new findings. The hyponatremia is likely related to the carbamazepine, the anemia could potentially also be related to this medication. The patient will come back in 6 weeks to repeat the blood work. I will call the patient to remind him.

## 2015-04-24 ENCOUNTER — Other Ambulatory Visit: Payer: Self-pay | Admitting: Neurology

## 2015-05-11 ENCOUNTER — Other Ambulatory Visit: Payer: Self-pay | Admitting: "Endocrinology

## 2015-05-25 ENCOUNTER — Telehealth: Payer: Self-pay | Admitting: Neurology

## 2015-05-25 ENCOUNTER — Other Ambulatory Visit: Payer: Self-pay | Admitting: Neurology

## 2015-05-25 DIAGNOSIS — Z5181 Encounter for therapeutic drug level monitoring: Secondary | ICD-10-CM

## 2015-05-25 NOTE — Telephone Encounter (Signed)
I called patient. The patient is to come in for blood work, recheck sodium level, hemoglobin. These studies were low in December, may be related to carbamazepine use.

## 2015-05-27 ENCOUNTER — Other Ambulatory Visit (INDEPENDENT_AMBULATORY_CARE_PROVIDER_SITE_OTHER): Payer: Self-pay

## 2015-05-27 DIAGNOSIS — Z5181 Encounter for therapeutic drug level monitoring: Secondary | ICD-10-CM | POA: Diagnosis not present

## 2015-05-27 DIAGNOSIS — Z0289 Encounter for other administrative examinations: Secondary | ICD-10-CM

## 2015-05-28 ENCOUNTER — Telehealth: Payer: Self-pay | Admitting: Neurology

## 2015-05-28 LAB — CBC WITH DIFFERENTIAL/PLATELET
BASOS ABS: 0 10*3/uL (ref 0.0–0.2)
Basos: 0 %
EOS (ABSOLUTE): 0.2 10*3/uL (ref 0.0–0.4)
Eos: 4 %
Hematocrit: 37.6 % (ref 37.5–51.0)
Hemoglobin: 12.6 g/dL (ref 12.6–17.7)
IMMATURE GRANS (ABS): 0 10*3/uL (ref 0.0–0.1)
Immature Granulocytes: 0 %
LYMPHS ABS: 1.7 10*3/uL (ref 0.7–3.1)
LYMPHS: 37 %
MCH: 29.2 pg (ref 26.6–33.0)
MCHC: 33.5 g/dL (ref 31.5–35.7)
MCV: 87 fL (ref 79–97)
MONOCYTES: 9 %
MONOS ABS: 0.4 10*3/uL (ref 0.1–0.9)
NEUTROS PCT: 50 %
Neutrophils Absolute: 2.2 10*3/uL (ref 1.4–7.0)
Platelets: 190 10*3/uL (ref 150–379)
RBC: 4.31 x10E6/uL (ref 4.14–5.80)
RDW: 14.1 % (ref 12.3–15.4)
WBC: 4.6 10*3/uL (ref 3.4–10.8)

## 2015-05-28 LAB — COMPREHENSIVE METABOLIC PANEL
ALK PHOS: 51 IU/L (ref 39–117)
ALT: 10 IU/L (ref 0–44)
AST: 13 IU/L (ref 0–40)
Albumin/Globulin Ratio: 2.2 (ref 1.1–2.5)
Albumin: 4.2 g/dL (ref 3.6–4.8)
BILIRUBIN TOTAL: 0.3 mg/dL (ref 0.0–1.2)
BUN/Creatinine Ratio: 16 (ref 10–22)
BUN: 14 mg/dL (ref 8–27)
CHLORIDE: 91 mmol/L — AB (ref 96–106)
CO2: 25 mmol/L (ref 18–29)
Calcium: 8.8 mg/dL (ref 8.6–10.2)
Creatinine, Ser: 0.88 mg/dL (ref 0.76–1.27)
GFR calc non Af Amer: 90 mL/min/{1.73_m2} (ref >59)
GFR, EST AFRICAN AMERICAN: 103 mL/min/{1.73_m2} (ref >59)
GLUCOSE: 116 mg/dL — AB (ref 65–99)
Globulin, Total: 1.9 g/dL (ref 1.5–4.5)
POTASSIUM: 4.7 mmol/L (ref 3.5–5.2)
Sodium: 130 mmol/L — ABNORMAL LOW (ref 134–144)
Total Protein: 6.1 g/dL (ref 6.0–8.5)

## 2015-05-28 NOTE — Telephone Encounter (Signed)
I called the patient and I talked with the wife. The sodium level is stable at 130. No change in dosing of the carbamazepine for now. CBC is OK.

## 2015-06-27 ENCOUNTER — Other Ambulatory Visit: Payer: Self-pay | Admitting: "Endocrinology

## 2015-06-27 DIAGNOSIS — E1169 Type 2 diabetes mellitus with other specified complication: Principal | ICD-10-CM

## 2015-06-27 DIAGNOSIS — E785 Hyperlipidemia, unspecified: Secondary | ICD-10-CM

## 2015-06-27 DIAGNOSIS — IMO0002 Reserved for concepts with insufficient information to code with codable children: Secondary | ICD-10-CM

## 2015-06-27 DIAGNOSIS — E1165 Type 2 diabetes mellitus with hyperglycemia: Secondary | ICD-10-CM

## 2015-06-27 DIAGNOSIS — Z794 Long term (current) use of insulin: Secondary | ICD-10-CM | POA: Diagnosis not present

## 2015-06-27 LAB — BASIC METABOLIC PANEL
BUN: 21 mg/dL (ref 7–25)
CALCIUM: 9.1 mg/dL (ref 8.6–10.3)
CO2: 31 mmol/L (ref 20–31)
CREATININE: 1.09 mg/dL (ref 0.70–1.25)
Chloride: 104 mmol/L (ref 98–110)
GLUCOSE: 129 mg/dL — AB (ref 65–99)
Potassium: 4.3 mmol/L (ref 3.5–5.3)
Sodium: 141 mmol/L (ref 135–146)

## 2015-06-27 LAB — LIPID PANEL
CHOL/HDL RATIO: 4.2 ratio (ref <=5.0)
CHOLESTEROL: 175 mg/dL (ref 125–200)
HDL: 42 mg/dL (ref >=40)
LDL Cholesterol: 113 mg/dL (ref <130)
Triglycerides: 99 mg/dL (ref <150)
VLDL: 20 mg/dL (ref <30)

## 2015-06-27 LAB — HEMOGLOBIN A1C
HEMOGLOBIN A1C: 6.9 % — AB (ref <5.7)
MEAN PLASMA GLUCOSE: 151 mg/dL — AB (ref <117)

## 2015-07-08 ENCOUNTER — Ambulatory Visit (INDEPENDENT_AMBULATORY_CARE_PROVIDER_SITE_OTHER): Payer: PPO | Admitting: "Endocrinology

## 2015-07-08 ENCOUNTER — Encounter: Payer: Self-pay | Admitting: "Endocrinology

## 2015-07-08 VITALS — BP 117/69 | HR 69 | Ht 73.0 in | Wt 161.0 lb

## 2015-07-08 DIAGNOSIS — I10 Essential (primary) hypertension: Secondary | ICD-10-CM | POA: Diagnosis not present

## 2015-07-08 DIAGNOSIS — IMO0001 Reserved for inherently not codable concepts without codable children: Secondary | ICD-10-CM

## 2015-07-08 DIAGNOSIS — Z794 Long term (current) use of insulin: Secondary | ICD-10-CM | POA: Diagnosis not present

## 2015-07-08 DIAGNOSIS — E1165 Type 2 diabetes mellitus with hyperglycemia: Secondary | ICD-10-CM | POA: Diagnosis not present

## 2015-07-08 DIAGNOSIS — E785 Hyperlipidemia, unspecified: Secondary | ICD-10-CM

## 2015-07-08 NOTE — Progress Notes (Signed)
Subjective:    Patient ID: John Reese, male    DOB: [birth year], PCP [name], MD   Past Medical History  Diagnosis Date  . Diabetes ([place])   . Dyslipidemia   . Tremor   . Degenerative arthritis   . Hypertension   . Auditory disturbance     Decreased auditory acuity  . 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 Trigeminal neuralgia     Right, V2 distribution   Past Surgical History  Procedure Laterality Date  . Inner ear surgery Right     Ear drum repair   Social History   Social History  . Marital Status: Married    Spouse Name: N/A  . Number of Children: 2  . Years of Education: HS   Occupational History  . Works in Theatre manager Other   Social History Main Topics  . Smoking status: Never Smoker   . Smokeless tobacco: Never Used  . Alcohol Use: No  . Drug Use: No  . Sexual Activity: Not Asked   Other Topics Concern  . None   Social History Narrative   Patient is right handed.   Patient drinks 2 cups coffee daily.   Outpatient Encounter Prescriptions as of 07/08/2015  Medication Sig  . aspirin 81 MG tablet Take 81 mg by mouth daily.  . carbamazepine (TEGRETOL) 200 MG tablet TAKE 1 AND 1/2 TABLETS IN THE MORNING, 1 TABLET AT MIDDAY AND 1 AND 1/2 TABLETS IN THE EVENING  . CINNAMON PO Take 1,000 mg by mouth daily.  . clonazePAM (KLONOPIN) 0.25 MG disintegrating tablet DISSOLVE 1 TABLET BY MOUTH TWICE A DAY  . fish oil-omega-3 fatty acids 1000 MG capsule Take 2 g by mouth daily.  Marland Kitchen gabapentin (NEURONTIN) 300 MG capsule TAKE 1 CAPSULE TWICE A DAY  . metFORMIN (GLUCOPHAGE) 500 MG tablet Take 1 tablet (500 mg total) by mouth 2 (two) times daily with a meal.  . Multiple Vitamin (MULTIVITAMIN) tablet Take 1 tablet by mouth daily.  . pravastatin (PRAVACHOL) 40 MG tablet Take 40 mg by mouth daily.  . propranolol (INDERAL) 40 MG tablet TAKE 2 TABLETS (80 MG TOTAL) BY MOUTH 2 (TWO) TIMES DAILY.  . ramipril  (ALTACE) 2.5 MG capsule Take 2.5 mg by mouth daily.  . TOUJEO SOLOSTAR 300 UNIT/ML SOPN 18 UNITS SUBCUTANEOUS AT BEDTIME  . ULTICARE MINI PEN NEEDLES 31G X 6 MM MISC USE AS DIRECTED AT BEDTIME   No facility-administered encounter medications on file as of 07/08/2015.   ALLERGIES: No Known Allergies VACCINATION STATUS:  There is no immunization history on file for this patient.  Diabetes He presents for his follow-up diabetic visit. He has type 2 diabetes mellitus. Onset time: He was diagnosed at approximate age of 65 years. His disease course has been improving. There are no hypoglycemic associated symptoms. Pertinent negatives for hypoglycemia include no confusion, headaches, pallor or seizures. There are no diabetic associated symptoms. Pertinent negatives for diabetes include no chest pain, no fatigue, no polydipsia, no polyphagia, no polyuria and no weakness. There are no hypoglycemic complications. Symptoms are improving. There are no diabetic complications. Risk factors for coronary artery disease include diabetes mellitus, dyslipidemia, hypertension, male sex and sedentary lifestyle. Current diabetic treatment includes insulin injections and oral agent (monotherapy). He is compliant with treatment most of the time. His weight is stable. He is following a generally healthy diet. When asked about meal planning, he reported none. He has had a  previous visit with a dietitian. He participates in exercise intermittently. His home blood glucose trend is decreasing steadily. His breakfast blood glucose range is generally 130-140 mg/dl. An ACE inhibitor/angiotensin II receptor blocker is being taken.  Hyperlipidemia This is a chronic problem. The current episode started more than 1 year ago. Pertinent negatives include no chest pain, myalgias or shortness of breath. Risk factors for coronary artery disease include diabetes mellitus, dyslipidemia, hypertension, male sex and a sedentary lifestyle.   Hypertension This is a chronic problem. The current episode started more than 1 year ago. Pertinent negatives include no chest pain, headaches, neck pain, palpitations or shortness of breath. Risk factors for coronary artery disease include dyslipidemia and diabetes mellitus. Past treatments include ACE inhibitors.     Review of Systems  Constitutional: Negative for fatigue and unexpected weight change.  HENT: Negative for dental problem, mouth sores and trouble swallowing.   Eyes: Negative for visual disturbance.  Respiratory: Negative for cough, choking, chest tightness, shortness of breath and wheezing.   Cardiovascular: Negative for chest pain, palpitations and leg swelling.  Gastrointestinal: Negative for nausea, vomiting, abdominal pain, diarrhea, constipation and abdominal distention.  Endocrine: Negative for polydipsia, polyphagia and polyuria.  Genitourinary: Negative for dysuria, urgency, hematuria and flank pain.  Musculoskeletal: Negative for myalgias, back pain, gait problem and neck pain.  Skin: Negative for pallor, rash and wound.  Neurological: Negative for seizures, syncope, weakness, numbness and headaches.  Psychiatric/Behavioral: Negative.  Negative for confusion and dysphoric mood.    Objective:    BP 117/69 mmHg  Pulse 69  Ht 6\' 1"  (1.854 m)  Wt 161 lb (73.029 kg)  BMI 21.25 kg/m2  SpO2 96%  Wt Readings from Last 3 Encounters:  07/08/15 161 lb (73.029 kg)  04/13/15 163 lb (73.936 kg)  04/08/15 163 lb (73.936 kg)    Physical Exam  Constitutional: He is oriented to person, place, and time. He appears well-developed and well-nourished. He is cooperative. No distress.  HENT:  Head: Normocephalic and atraumatic.  Eyes: EOM are normal.  Neck: Normal range of motion. Neck supple. No tracheal deviation present. No thyromegaly present.  Cardiovascular: Normal rate, S1 normal, S2 normal and normal heart sounds.  Exam reveals no gallop.   No murmur  heard. Pulses:      Dorsalis pedis pulses are 2+ on the right side, and 2+ on the left side.       Posterior tibial pulses are 2+ on the right side, and 2+ on the left side.  Pulmonary/Chest: Breath sounds normal. No respiratory distress. He has no wheezes.  Abdominal: Soft. Bowel sounds are normal. He exhibits no distension. There is no tenderness. There is no guarding and no CVA tenderness.  Musculoskeletal: He exhibits no edema.       Right shoulder: He exhibits no swelling and no deformity.  Neurological: He is alert and oriented to person, place, and time. He has normal strength and normal reflexes. No cranial nerve deficit or sensory deficit. Gait normal.  Skin: Skin is warm and dry. No rash noted. No cyanosis. Nails show no clubbing.  Psychiatric: He has a normal mood and affect. His speech is normal and behavior is normal. Judgment and thought content normal. Cognition and memory are normal.    Results for orders placed or performed in visit on [id number]  Basic metabolic panel  Result Value Ref Range   Sodium 141 135 - 146 mmol/L   Potassium 4.3 3.5 - 5.3 mmol/L   Chloride 104 98 -  110 mmol/L   CO2 31 20 - 31 mmol/L   Glucose, Bld 129 (H) 65 - 99 mg/dL   BUN 21 7 - 25 mg/dL   Creat 1.09 0.70 - 1.25 mg/dL   Calcium 9.1 8.6 - 10.3 mg/dL  Lipid panel  Result Value Ref Range   Cholesterol 175 125 - 200 mg/dL   Triglycerides 99 <150 mg/dL   HDL 42 >=40 mg/dL   Total CHOL/HDL Ratio 4.2 <=5.0 Ratio   VLDL 20 <30 mg/dL   LDL Cholesterol 113 <130 mg/dL  Hemoglobin A1c  Result Value Ref Range   Hgb A1c MFr Bld 6.9 (H) <5.7 %   Mean Plasma Glucose 151 (H) <117 mg/dL   Diabetic Labs (most recent): Lab Results  Component Value Date   HGBA1C 6.9* 06/27/2015   HGBA1C 6.6* 03/28/2015     Assessment & Plan:   1. Uncontrolled type 2 diabetes mellitus without complication, with long-term current use of insulin ([facility]) -No gross complications so far, however patient remains at a high  risk for more acute and chronic complications of diabetes which include CAD, CVA, CKD, retinopathy, and neuropathy. These are all discussed in detail with the patient.  Patient came with improved and controlled fasting glucose profile, and  recent A1c of 6.9% generally improving from 10.5 %.  Glucose logs and insulin administration records pertaining to this visit,  to be scanned into patient's records.  Recent labs reviewed.   - I have re-counseled the patient on diet management   by adopting a carbohydrate restricted / protein rich  Diet.  - Suggestion is made for patient to avoid simple carbohydrates   from their diet including Cakes , Desserts, Ice Cream,  Soda (  diet and regular) , Sweet Tea , Candies,  Chips, Cookies, Artificial Sweeteners,   and "Sugar-free" Products .  This will help patient to have stable blood glucose profile and potentially avoid unintended  Weight gain.  - Patient is advised to stick to a routine mealtimes to eat 3 meals  a day and avoid unnecessary snacks ( to snack only to correct hypoglycemia).  - The patient  has been  scheduled with Jearld Fenton, RDN, CDE for individualized DM education.  - I have approached patient with the following individualized plan to manage diabetes and patient agrees.  he will need basal insulin long term, currently Toujeo 15 units daily at bedtime , associated with monitoring BG at least at fasting.  -I will continue his metformin  500 mg by mouth twice a day. -he is allowed to increase his healthy carbs intake  to avoid unintended weight loss. - his c-peptide is critically low at 0.5, indicating pancreatic exhaustion.  - His GAD antibodies are negative, however does not rule out a possibility of LADA. -he would not need prandial insulin for now.  He is not a candidate for incretin therapy.  - Patient specific target  for A1c; LDL, HDL, Triglycerides, and  Waist Circumference were discussed in detail.  2) BP/HTN:  controlled.  Continue current medications including ACEI/ARB. 3) Lipids/HPL:  continue statins. 4)  Weight/Diet: CDE consult in progress, exercise, and carbohydrates information provided.  5) Chronic Care/Health Maintenance:  -Patient is on ACEI/ARB and Statin medications and encouraged to continue to follow up with Ophthalmology, Podiatrist at least yearly or according to recommendations, and advised to  stay away from smoking. I have recommended yearly flu vaccine and pneumonia vaccination at least every 5 years; moderate intensity exercise for up to 150  minutes weekly; and  sleep for at least 7 hours a day.  - 25 minutes of time was spent on the care of this patient , 50% of which was applied for counseling on diabetes complications and their preventions.  - I advised patient to maintain close follow up with [name], MD for primary care needs.  Patient is asked to bring meter and  blood glucose logs during their next visit.   Follow up plan: -Return in about 4 months (around 11/07/2015) for diabetes, high blood pressure, high cholesterol, follow up with pre-visit labs, meter, and logs.  Glade Tonio, MD Phone: [phone]  Fax: [phone]   07/08/2015, 9:53 AM

## 2015-07-29 ENCOUNTER — Other Ambulatory Visit: Payer: Self-pay | Admitting: "Endocrinology

## 2015-07-29 ENCOUNTER — Other Ambulatory Visit: Payer: Self-pay | Admitting: Neurology

## 2015-08-03 DIAGNOSIS — Z7984 Long term (current) use of oral hypoglycemic drugs: Secondary | ICD-10-CM | POA: Diagnosis not present

## 2015-08-03 DIAGNOSIS — E119 Type 2 diabetes mellitus without complications: Secondary | ICD-10-CM | POA: Diagnosis not present

## 2015-08-03 DIAGNOSIS — Z794 Long term (current) use of insulin: Secondary | ICD-10-CM | POA: Diagnosis not present

## 2015-08-03 DIAGNOSIS — Q149 Congenital malformation of posterior segment of eye, unspecified: Secondary | ICD-10-CM | POA: Diagnosis not present

## 2015-08-03 LAB — HM DIABETES EYE EXAM

## 2015-08-23 ENCOUNTER — Encounter: Payer: Self-pay | Admitting: "Endocrinology

## 2015-08-26 DIAGNOSIS — H903 Sensorineural hearing loss, bilateral: Secondary | ICD-10-CM | POA: Diagnosis not present

## 2015-09-30 DIAGNOSIS — E784 Other hyperlipidemia: Secondary | ICD-10-CM | POA: Diagnosis not present

## 2015-09-30 DIAGNOSIS — R799 Abnormal finding of blood chemistry, unspecified: Secondary | ICD-10-CM | POA: Diagnosis not present

## 2015-09-30 DIAGNOSIS — R7309 Other abnormal glucose: Secondary | ICD-10-CM | POA: Diagnosis not present

## 2015-09-30 LAB — HEMOGLOBIN A1C: Hemoglobin A1C: 7.1

## 2015-10-06 DIAGNOSIS — D649 Anemia, unspecified: Secondary | ICD-10-CM | POA: Diagnosis not present

## 2015-10-06 DIAGNOSIS — Z0000 Encounter for general adult medical examination without abnormal findings: Secondary | ICD-10-CM | POA: Diagnosis not present

## 2015-10-06 DIAGNOSIS — E785 Hyperlipidemia, unspecified: Secondary | ICD-10-CM | POA: Diagnosis not present

## 2015-10-06 DIAGNOSIS — E1169 Type 2 diabetes mellitus with other specified complication: Secondary | ICD-10-CM | POA: Diagnosis not present

## 2015-10-06 DIAGNOSIS — I10 Essential (primary) hypertension: Secondary | ICD-10-CM | POA: Diagnosis not present

## 2015-10-06 DIAGNOSIS — R7309 Other abnormal glucose: Secondary | ICD-10-CM | POA: Diagnosis not present

## 2015-10-07 ENCOUNTER — Ambulatory Visit (INDEPENDENT_AMBULATORY_CARE_PROVIDER_SITE_OTHER): Payer: PPO | Admitting: Cardiology

## 2015-10-07 ENCOUNTER — Encounter: Payer: Self-pay | Admitting: Cardiology

## 2015-10-07 VITALS — BP 114/62 | HR 68 | Ht 73.0 in | Wt 158.0 lb

## 2015-10-07 DIAGNOSIS — R0602 Shortness of breath: Secondary | ICD-10-CM

## 2015-10-07 DIAGNOSIS — Z8249 Family history of ischemic heart disease and other diseases of the circulatory system: Secondary | ICD-10-CM

## 2015-10-07 DIAGNOSIS — E785 Hyperlipidemia, unspecified: Secondary | ICD-10-CM

## 2015-10-07 DIAGNOSIS — I10 Essential (primary) hypertension: Secondary | ICD-10-CM | POA: Diagnosis not present

## 2015-10-07 NOTE — Patient Instructions (Signed)
Medication Instructions:  The current medical regimen is effective;  continue present plan and medications.  Follow-Up: Follow up as needed.  If you need a refill on your cardiac medications before your next appointment, please call your pharmacy.  Thank you for choosing [facility]!!     

## 2015-10-07 NOTE — Progress Notes (Signed)
[facility]. [address] [place], [place]  [address] Phone: [phone] Fax:  [phone]  Date:  10/07/2015   ID:  KHALIQUE BORG, DOB 12-03-1950, MRN [id number]  PCP:  [name], MD   History of Present Illness: John Reese is a 65 y.o. male here for follow-up of shortness of breath, family history of coronary artery disease with his brother dying at age 70 from heart attack. He underwent nuclear stress test that was reassuring. Coronary cusp location was noted on CT scan of RCA and LAD. He has a history of diabetes, hyperlipidemia, hypertension. Nonsmoker. Father had stroke. Brother had heart attack 56. Mother pacer.  CT scan was performed and was negative for pulmonary embolism. Chest x-ray benign. CT scan did however show mid LAD calcification of coronary artery as well as proximal RCA calcification.  Overall he is on secondary prevention medications including statin, aspirin. He is not having any chest pain. He does have shortness of breath at times with heavy exertional activity or exercise and this is been chronic. His weight has decreased somewhat. He states that his heart from the maintain his weight. He has not described any fevers, chills, vomiting. No diaphoresis.  Wt Readings from Last 3 Encounters:  10/07/15 158 lb (71.668 kg)  07/08/15 161 lb (73.029 kg)  04/13/15 163 lb (73.936 kg)     Past Medical History  Diagnosis Date  . Diabetes ([place])   . Dyslipidemia   . Tremor   . Degenerative arthritis   . Hypertension   . Auditory disturbance     Decreased auditory acuity  . 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 Trigeminal neuralgia     Right, V2 distribution    Past Surgical History  Procedure Laterality Date  . Inner ear surgery Right     Ear drum repair    Current Outpatient Prescriptions  Medication Sig Dispense Refill  . aspirin 81 MG tablet Take 81 mg by mouth daily.    .  carbamazepine (TEGRETOL) 200 MG tablet TAKE 1 AND 1/2 TABLETS IN THE MORNING, 1 TABLET AT MIDDAY AND 1 AND 1/2 TABLETS IN THE EVENING 360 tablet 0  . CINNAMON PO Take 1,000 mg by mouth daily.    . clonazePAM (KLONOPIN) 0.25 MG disintegrating tablet DISSOLVE 1 TABLET BY MOUTH TWICE A DAY 60 tablet 5  . fish oil-omega-3 fatty acids 1000 MG capsule Take 2 g by mouth daily.    Marland Kitchen gabapentin (NEURONTIN) 300 MG capsule TAKE 1 CAPSULE TWICE A DAY 180 capsule 1  . metFORMIN (GLUCOPHAGE) 500 MG tablet TAKE 1 TABLET (500 MG TOTAL) BY MOUTH 2 (TWO) TIMES DAILY WITH A MEAL. 60 tablet 3  . Multiple Vitamin (MULTIVITAMIN) tablet Take 1 tablet by mouth daily.    . pravastatin (PRAVACHOL) 40 MG tablet Take 40 mg by mouth daily.    . propranolol (INDERAL) 40 MG tablet TAKE 2 TABLETS (80 MG TOTAL) BY MOUTH 2 (TWO) TIMES DAILY. 360 tablet 1  . ramipril (ALTACE) 2.5 MG capsule Take 2.5 mg by mouth daily.    [name] 300 UNIT/ML SOPN Inject 15 Units as directed at bedtime.    Marland Kitchen ULTICARE MINI PEN NEEDLES 31G X 6 MM MISC USE AS DIRECTED AT BEDTIME 100 each 2   No current facility-administered medications for this visit.    Allergies:   No Known Allergies  Social History:  The patient  reports  that he has never smoked. He has never used smokeless tobacco. He reports that he does not drink alcohol or use illicit drugs.   Family History  Problem Relation Age of Onset  . Stroke Father   . Peripheral vascular disease Sister   . Heart attack Brother   . Stroke Mother     ROS:  Please see the history of present illness.   Denies any fevers, chills, orthopnea, PND, syncope, bleeding, rash. Prior trigeminal neuralgia noted.   All other systems reviewed and negative.   PHYSICAL EXAM: VS:  BP 114/62 mmHg  Pulse 68  Ht 6\' 1"  (1.854 m)  Wt 158 lb (71.668 kg)  BMI 20.85 kg/m2 Well nourished, well developed, in no acute distress HEENT: normal, [place]/AT, EOMI Neck: no JVD, normal carotid upstroke, no  bruit Cardiac:  normal S1, S2; RRR; no murmur Lungs:  clear to auscultation bilaterally, no wheezing, rhonchi or rales Abd: soft, nontender, no hepatomegaly, no bruits Ext: no edema, 2+ distal pulses Skin: warm and dry GU: deferred Neuro: no focal abnormalities noted, AAO x 3  EKG:  10/06/15-sinus rhythm, 67 bpm baseline artifact, no other abnormalities personally viewed-prior 03/24/14-sinus rhythm, 68 with no other abnormalities. Subtle J-point elevation noted in lead 3, aVF.     Labs: A1c 10.2, proBNP 38, creatinine 1.09, hemoglobin 13.6  Nuclear stress test: 04/02/14-overall reassuring.  ASSESSMENT AND PLAN:  1. Dyspnea- improved symptoms, nuclear stress test in December 2015 was reassuring. Normal ejection fraction. He walks daily in the morning. Diabetes equals a coronary artery disease equivalent. Coronary artery calcification seen on CT scan. Discussed at length. Continue with aggressive secondary prevention, statin with LDL goal of 70, diabetes control, hypertension control. Aspirin. Statin. 2. Coronary artery calcification-LAD, RCA. Discuss coronary atherosclerosis  3. Hyperlipidemia-on pravastatin. Excellent. 4. Essential hypertension-well controlled. On low-dose ACE inhibitor-diabetes. 5. Diabetes-uncontrolled with hemoglobin A1c of 10.2. He is working hard with Dr. Edrick Oh on this. 6. Family history of CAD-Brother MI in his 67s. 7. Tremor-[name]. 8. We'll see back on as-needed basis. Excellent secondary prevention.   Signed, Candee Furbish, MD [facility]  10/07/2015 10:53 AM

## 2015-10-12 ENCOUNTER — Ambulatory Visit: Payer: Medicare Other | Admitting: Adult Health

## 2015-10-12 DIAGNOSIS — D649 Anemia, unspecified: Secondary | ICD-10-CM | POA: Diagnosis not present

## 2015-10-12 DIAGNOSIS — Z1211 Encounter for screening for malignant neoplasm of colon: Secondary | ICD-10-CM | POA: Diagnosis not present

## 2015-10-12 DIAGNOSIS — Z0000 Encounter for general adult medical examination without abnormal findings: Secondary | ICD-10-CM | POA: Diagnosis not present

## 2015-10-15 ENCOUNTER — Other Ambulatory Visit: Payer: Self-pay | Admitting: Neurology

## 2015-10-23 ENCOUNTER — Other Ambulatory Visit: Payer: Self-pay | Admitting: Neurology

## 2015-10-26 ENCOUNTER — Encounter: Payer: Self-pay | Admitting: Adult Health

## 2015-10-26 ENCOUNTER — Ambulatory Visit (INDEPENDENT_AMBULATORY_CARE_PROVIDER_SITE_OTHER): Payer: PPO | Admitting: Adult Health

## 2015-10-26 VITALS — BP 134/64 | HR 62

## 2015-10-26 DIAGNOSIS — G250 Essential tremor: Secondary | ICD-10-CM | POA: Diagnosis not present

## 2015-10-26 DIAGNOSIS — G500 Trigeminal neuralgia: Secondary | ICD-10-CM

## 2015-10-26 DIAGNOSIS — Z5181 Encounter for therapeutic drug level monitoring: Secondary | ICD-10-CM | POA: Diagnosis not present

## 2015-10-26 NOTE — Progress Notes (Signed)
I have read the note, and I agree with the clinical assessment and plan.  John Reese   

## 2015-10-26 NOTE — Progress Notes (Signed)
PATIENT: John Reese DOB: October 26, 1950  REASON FOR VISIT: follow up- tremor HISTORY FROM: patient  HISTORY OF PRESENT ILLNESS: John Reese is a 65 year old male with a history of essential tremor. He returns today for follow-up. He is on propranolol 80 mg twice a day. He states that he has noticed minimal benefit. Patient has trouble feeding himself and writing. He states that now his wife signs everything for him. He is able to dress himself. His wife has noticed tremor in the legs as well. Wife states that when the tremor becomes severe it as if the whole arm shaking not just the hands. The patient is interested in the deep brain stimulator. He is also on carbamazepine and gabapentin for trigeminal neuralgia. He reports that this primarily controls his symptoms occasionally he'll have a flareup. In the past blood work has shown a low sodium level most likely related to carbamazepine. He returns today for an evaluation.  HISTORY 04/13/15: John Reese is a 65 year old right-handed white male with a history of an essential tremor affecting both upper extremities. The patient has trigeminal neuralgia affecting the right V2 distribution. The tremor has continued to be a problem, he was increased on the propranolol when last seen, but this offered minimal benefit. The patient essentially is unable to perform handwriting, he has had significant difficulty feeding himself. The patient does have some troubles with slurred speech at times, likely associated with use of medications. The patient has had some mild cognitive slowing as well. He has retired at this point. The patient has not had much issue with his trigeminal neuralgia, he had a brief bout of pain 3 months ago, but this has resolved. The patient denies any significant issues with balance, no falls. He returns to the office today for an evaluation.  REVIEW OF SYSTEMS: Out of a complete 14 system review of symptoms, the patient complains only of  the following symptoms, and all other reviewed systems are negative.  Runny nose  ALLERGIES: No Known Allergies  HOME MEDICATIONS: Outpatient Prescriptions Prior to Visit  Medication Sig Dispense Refill  . aspirin 81 MG tablet Take 81 mg by mouth daily.    . carbamazepine (TEGRETOL) 200 MG tablet TAKE 1 AND 1/2 TABLETS IN THE MORNING, 1 TABLET AT MIDDAY AND 1 AND 1/2 TABLETS IN THE EVENING 360 tablet 0  . CINNAMON PO Take 1,000 mg by mouth daily.    . clonazePAM (KLONOPIN) 0.25 MG disintegrating tablet DISSOLVE 1 TABLET BY MOUTH TWICE A DAY 60 tablet 5  . fish oil-omega-3 fatty acids 1000 MG capsule Take 2 g by mouth daily.    Marland Kitchen gabapentin (NEURONTIN) 300 MG capsule TAKE 1 CAPSULE TWICE A DAY 180 capsule 0  . metFORMIN (GLUCOPHAGE) 500 MG tablet TAKE 1 TABLET (500 MG TOTAL) BY MOUTH 2 (TWO) TIMES DAILY WITH A MEAL. 60 tablet 3  . Multiple Vitamin (MULTIVITAMIN) tablet Take 1 tablet by mouth daily.    . pravastatin (PRAVACHOL) 40 MG tablet Take 40 mg by mouth daily.    . propranolol (INDERAL) 40 MG tablet TAKE 2 TABLETS (80 MG TOTAL) BY MOUTH 2 (TWO) TIMES DAILY. 360 tablet 1  . ramipril (ALTACE) 2.5 MG capsule Take 2.5 mg by mouth daily.    [name] 300 UNIT/ML SOPN Inject 15 Units as directed at bedtime.    Marland Kitchen ULTICARE MINI PEN NEEDLES 31G X 6 MM MISC USE AS DIRECTED AT BEDTIME 100 each 2   No facility-administered medications prior to  visit.    PAST MEDICAL HISTORY: Past Medical History  Diagnosis Date  . Diabetes ([place])   . Dyslipidemia   . Tremor   . Degenerative arthritis   . Hypertension   . Auditory disturbance     Decreased auditory acuity  . 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 Trigeminal neuralgia     Right, V2 distribution    PAST SURGICAL HISTORY: Past Surgical History  Procedure Laterality Date  . Inner ear surgery Right     Ear drum repair    FAMILY HISTORY: Family History  Problem Relation Age of  Onset  . Stroke Father   . Peripheral vascular disease Sister   . Heart attack Brother   . Stroke Mother     SOCIAL HISTORY: Social History   Social History  . Marital Status: Married    Spouse Name: N/A  . Number of Children: 2  . Years of Education: HS   Occupational History  . Works in Theatre manager Other   Social History Main Topics  . Smoking status: Never Smoker   . Smokeless tobacco: Never Used  . Alcohol Use: No  . Drug Use: No  . Sexual Activity: Not on file   Other Topics Concern  . Not on file   Social History Narrative   Patient is right handed.   Patient drinks 2 cups coffee daily.      PHYSICAL EXAM  Filed Vitals:   10/26/15 0920  BP: 134/64  Pulse: 62   There is no weight on file to calculate BMI.  Generalized: Well developed, in no acute distress   Neurological examination  Mentation: Alert oriented to time, place, history taking. Follows all commands speech and language fluent Cranial nerve II-XII: Pupils were equal round reactive to light. Extraocular movements were full, visual field were full on confrontational test. Facial sensation and strength were normal. Uvula tongue midline. Head turning and shoulder shrug  were normal and symmetric. Motor: The motor testing reveals 5 over 5 strength of all 4 extremities. Good symmetric motor tone is noted throughout. Intention tremor noted in the upper extremities. Mild intention tremor noted in the lower extremities. Sensory: Sensory testing is intact to soft touch on all 4 extremities. No evidence of extinction is noted.  Coordination: Cerebellar testing reveals good finger-nose-finger and heel-to-shin bilaterally.  Gait and station: Gait is normal. Tandem gait is normal. Romberg is negative. No drift is seen.  Reflexes: Deep tendon reflexes are symmetric and normal bilaterally.   DIAGNOSTIC DATA (LABS, IMAGING, TESTING) - I reviewed patient records, labs, notes, testing and imaging myself where  available.  Lab Results  Component Value Date   WBC 4.6 05/27/2015   HGB 13.6 02/23/2014   HCT 37.6 05/27/2015   MCV 87 05/27/2015   PLT 190 05/27/2015      Component Value Date/Time   NA 141 06/27/2015 0816   NA 130* 05/27/2015 0824   K 4.3 06/27/2015 0816   CL 104 06/27/2015 0816   CO2 31 06/27/2015 0816   GLUCOSE 129* 06/27/2015 0816   GLUCOSE 116* 05/27/2015 0824   BUN 21 06/27/2015 0816   BUN 14 05/27/2015 0824   CREATININE 1.09 06/27/2015 0816   CREATININE 0.88 05/27/2015 0824   CALCIUM 9.1 06/27/2015 0816   PROT 6.1 05/27/2015 0824   PROT 6.5 02/23/2014 1155   ALBUMIN 4.2 05/27/2015 0824   ALBUMIN 3.6 02/23/2014 1155   AST 13 05/27/2015 0824   ALT  10 05/27/2015 0824   ALKPHOS 51 05/27/2015 0824   BILITOT 0.3 05/27/2015 0824   BILITOT <0.2* 02/23/2014 1155   GFRNONAA 90 05/27/2015 0824   GFRAA 103 05/27/2015 0824   Lab Results  Component Value Date   CHOL 175 06/27/2015   HDL 42 06/27/2015   LDLCALC 113 06/27/2015   TRIG 99 06/27/2015   CHOLHDL 4.2 06/27/2015   Lab Results  Component Value Date   HGBA1C 6.9* 06/27/2015      ASSESSMENT AND PLAN 65 y.o. 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 here with:  1. Essential tremor 2. Trigeminal neuralgia  The patient will remain on propranolol 80 mg twice a day. At this time he would like to have more information about the deep brain stimulator versus trying any new medication. I will put a referral in to Dr. Carles Collet with [facility]. He will continue on carbamazepine and gabapentin for trigeminal neuralgia. I will check blood work today. He will follow-up in 3 months or sooner if needed.    [name], MSN, NP-C 10/26/2015, 10:43 AM [facility] [address], [place], [place]  [address] [phone]

## 2015-10-26 NOTE — Patient Instructions (Signed)
Refer to Dr. Carles Collet for consultation regarding DBS therapy Continue propranolol, carbamazepine and klonopin If your symptoms worsen or you develop new symptoms please let us know.

## 2015-10-27 ENCOUNTER — Telehealth: Payer: Self-pay

## 2015-10-27 ENCOUNTER — Other Ambulatory Visit: Payer: Self-pay | Admitting: Neurology

## 2015-10-27 LAB — CBC WITH DIFFERENTIAL/PLATELET
BASOS: 0 %
Basophils Absolute: 0 10*3/uL (ref 0.0–0.2)
EOS (ABSOLUTE): 0.2 10*3/uL (ref 0.0–0.4)
EOS: 4 %
HEMATOCRIT: 37.2 % — AB (ref 37.5–51.0)
Hemoglobin: 11.9 g/dL — ABNORMAL LOW (ref 12.6–17.7)
IMMATURE GRANULOCYTES: 0 %
Immature Grans (Abs): 0 10*3/uL (ref 0.0–0.1)
Lymphocytes Absolute: 1.6 10*3/uL (ref 0.7–3.1)
Lymphs: 34 %
MCH: 29 pg (ref 26.6–33.0)
MCHC: 32 g/dL (ref 31.5–35.7)
MCV: 91 fL (ref 79–97)
MONOS ABS: 0.4 10*3/uL (ref 0.1–0.9)
Monocytes: 8 %
NEUTROS ABS: 2.4 10*3/uL (ref 1.4–7.0)
NEUTROS PCT: 54 %
PLATELETS: 202 10*3/uL (ref 150–379)
RBC: 4.11 x10E6/uL — ABNORMAL LOW (ref 4.14–5.80)
RDW: 13.8 % (ref 12.3–15.4)
WBC: 4.5 10*3/uL (ref 3.4–10.8)

## 2015-10-27 LAB — COMPREHENSIVE METABOLIC PANEL
A/G RATIO: 2.1 (ref 1.2–2.2)
ALBUMIN: 4.2 g/dL (ref 3.6–4.8)
ALT: 12 IU/L (ref 0–44)
AST: 8 IU/L (ref 0–40)
Alkaline Phosphatase: 49 IU/L (ref 39–117)
BUN/Creatinine Ratio: 14 (ref 10–24)
BUN: 18 mg/dL (ref 8–27)
Bilirubin Total: <0.2 mg/dL (ref 0.0–1.2)
CALCIUM: 9.3 mg/dL (ref 8.6–10.2)
CO2: 26 mmol/L (ref 18–29)
CREATININE: 1.32 mg/dL — AB (ref 0.76–1.27)
Chloride: 97 mmol/L (ref 96–106)
GFR, EST AFRICAN AMERICAN: 65 mL/min/{1.73_m2} (ref >59)
GFR, EST NON AFRICAN AMERICAN: 56 mL/min/{1.73_m2} — AB (ref >59)
GLOBULIN, TOTAL: 2 g/dL (ref 1.5–4.5)
Glucose: 158 mg/dL — ABNORMAL HIGH (ref 65–99)
POTASSIUM: 5.2 mmol/L (ref 3.5–5.2)
SODIUM: 138 mmol/L (ref 134–144)
TOTAL PROTEIN: 6.2 g/dL (ref 6.0–8.5)

## 2015-10-27 LAB — CARBAMAZEPINE LEVEL, TOTAL: Carbamazepine (Tegretol), S: 8.9 ug/mL (ref 4.0–12.0)

## 2015-10-27 NOTE — Telephone Encounter (Signed)
-----   Message from [name], NP sent at 10/27/2015 11:48 AM EDT ----- Blood work relatively unremarkable. RBC, hemoglobin and hematocrit slightly low. We'll continue to monitor. Please call patient with results. Please send results to primary care physician.

## 2015-10-27 NOTE — Telephone Encounter (Signed)
I spoke to pt. I advised him that his blood work was relatively unremarkable, but his RBC, hemoglobin, and hematocrit was slightly low, and we will continue to monitor it. Pt verbalized understanding of results. Pt had no questions at this time but was encouraged to call back if questions arise. Pt asked that I send results to PCP Dr. Edrick Oh.

## 2015-11-11 ENCOUNTER — Ambulatory Visit: Payer: PPO | Admitting: "Endocrinology

## 2015-11-14 ENCOUNTER — Ambulatory Visit (INDEPENDENT_AMBULATORY_CARE_PROVIDER_SITE_OTHER): Payer: PPO | Admitting: Neurology

## 2015-11-14 ENCOUNTER — Encounter: Payer: Self-pay | Admitting: Neurology

## 2015-11-14 VITALS — BP 150/88 | HR 62 | Ht 73.0 in | Wt 159.0 lb

## 2015-11-14 DIAGNOSIS — G250 Essential tremor: Secondary | ICD-10-CM

## 2015-11-14 DIAGNOSIS — N289 Disorder of kidney and ureter, unspecified: Secondary | ICD-10-CM | POA: Diagnosis not present

## 2015-11-14 DIAGNOSIS — G253 Myoclonus: Secondary | ICD-10-CM

## 2015-11-14 NOTE — Patient Instructions (Signed)
We will schedule Neuro Cognitive testing in our office.

## 2015-11-14 NOTE — Progress Notes (Signed)
Subjective:   John Reese was seen in consultation in the movement disorder clinic at the request of [name], NP.  His PCP is [name], MD.  The evaluation is for tremor.  This patient is accompanied in the office by his spouse who supplements the history. The records that were made available to me were reviewed. Wife thinks that he has had tremor for as long as they have been married, for over 30 years.  His father and paternal GF had tremor.   He estimates that he started seeing [name] 10 years ago but I only have records from the recent years.  He has been on klonopin for many years, 0.25mg  bid (never been on higher dosage) and is now on propranolol 80 mg bid as well for tremor.  Pt also has long hx of right trigeminal neuralgia and tried lyrica, 100mg  tid but it caused asterixis (pt states that he changed because of cost more than SE).  He is on tegretol but had hyponatremia with this in the past.  Is on gabapentin 300 mg bid.    Affected by caffeine:  No. (2 cups of coffee/day) Affected by alcohol: (doesn't drink EtOH) Affected by stress:  Yes.   Affected by fatigue:  No. Spills soup if on spoon:  Yes.   Spills glass of liquid if full:  Yes.   Affects ADL's (tying shoes, brushing teeth, etc):  Yes.   (shaves with electric now because of tremor; brushes teeth with both hands; doesn't wear many shoes with ties; wife has to give insulin shots)  Current/Previously tried tremor medications: klonopin, propranolol, lyrica (for TN), gabapentin (for TN)  Current medications that may exacerbate tremor:  n/a  Outside reports reviewed: historical medical records, lab reports and office notes.  No Known Allergies  Outpatient Encounter Prescriptions as of 11/14/2015  Medication Sig  . aspirin 81 MG tablet Take 81 mg by mouth daily.  . carbamazepine (TEGRETOL) 200 MG tablet TAKE 1 AND 1/2 TABLETS IN THE MORNING, 1 TABLET AT MIDDAY AND 1 AND 1/2 TABLETS IN THE EVENING  (Patient taking differently: 1 tablet twice daily)  . CINNAMON PO Take 1,000 mg by mouth daily.  . clonazePAM (KLONOPIN) 0.25 MG disintegrating tablet DISSOLVE 1 TABLET BY MOUTH TWICE A DAY  . fish oil-omega-3 fatty acids 1000 MG capsule Take 2 g by mouth daily.  Marland Kitchen gabapentin (NEURONTIN) 300 MG capsule TAKE 1 CAPSULE TWICE A DAY  . metFORMIN (GLUCOPHAGE) 500 MG tablet TAKE 1 TABLET (500 MG TOTAL) BY MOUTH 2 (TWO) TIMES DAILY WITH A MEAL.  . Multiple Vitamin (MULTIVITAMIN) tablet Take 1 tablet by mouth daily.  . pravastatin (PRAVACHOL) 40 MG tablet Take 40 mg by mouth daily.  . propranolol (INDERAL) 40 MG tablet TAKE 2 TABLETS (80 MG TOTAL) BY MOUTH 2 (TWO) TIMES DAILY.  . ramipril (ALTACE) 2.5 MG capsule Take 2.5 mg by mouth daily.  [name] 300 UNIT/ML SOPN Inject 15 Units as directed at bedtime.  Marland Kitchen ULTICARE MINI PEN NEEDLES 31G X 6 MM MISC USE AS DIRECTED AT BEDTIME (Patient not taking: Reported on 11/14/2015)   No facility-administered encounter medications on file as of 11/14/2015.     Past Medical History:  Diagnosis Date  . Auditory disturbance    Decreased auditory acuity  . 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 Diabetes ([place])   . Dyslipidemia   . Tremor   . Trigeminal neuralgia  Right, V2 distribution    Past Surgical History:  Procedure Laterality Date  . FINGER AMPUTATION    . INNER EAR SURGERY Right    Ear drum repair    Social History   Social History  . Marital status: Married    Spouse name: N/A  . Number of children: 2  . Years of education: HS   Occupational History  . Works in Theatre manager Other   Social History Main Topics  . Smoking status: Never Smoker  . Smokeless tobacco: Never Used  . Alcohol use No  . Drug use: No  . Sexual activity: Not on file   Other Topics Concern  . Not on file   Social History Narrative   Patient is right handed.   Patient drinks 2 cups coffee daily.     Family Status  Relation Status  . Father Deceased at age 36  . Sister Alive  . Brother Deceased  . Mother Alive  . Brother Alive  . Brother Alive  . Brother Alive  . Brother Alive  . Sister Alive    Review of Systems A complete 10 system ROS was obtained and was negative apart from what is mentioned.   Objective:   VITALS:   Vitals:   11/14/15 0856  BP: (!) 150/88  Pulse: 62  Weight: 159 lb (72.1 kg)  Height: 6\' 1"  (1.854 m)   Gen:  Appears stated age and in NAD. HEENT:  Normocephalic, atraumatic. The mucous membranes are moist. The superficial temporal arteries are without ropiness or tenderness. Cardiovascular: Regular rate and rhythm. Lungs: Clear to auscultation bilaterally. Neck: There are no carotid bruits noted bilaterally.  NEUROLOGICAL:  Orientation:  The patient is alert and oriented x 3.  Recent and remote memory are intact.  Attention span and concentration are normal.  Able to name objects and repeat without trouble.  Fund of knowledge is appropriate Cranial nerves: There is good facial symmetry. The pupils are equal round and reactive to light bilaterally. Fundoscopic exam reveals clear disc margins bilaterally. Extraocular muscles are intact and visual fields are full to confrontational testing. Speech is fluent and clear. Soft palate rises symmetrically and there is no tongue deviation. Hearing is intact to conversational tone. Tone: Tone is good throughout. Sensation: Sensation is intact to light touch and pinprick throughout (facial, trunk, extremities). Vibration is decreased at the bilateral big toe. There is no extinction with double simultaneous stimulation. There is no sensory dermatomal level identified. Coordination:  The patient has no dysdiadichokinesia or dysmetria. Motor: Strength is 5/5 in the bilateral upper and lower extremities.  Shoulder shrug is equal bilaterally.  There is no pronator drift.  There are no fasciculations noted. DTR's:  Deep tendon reflexes are 2-/4 at the bilateral biceps, triceps, brachioradialis, patella and trace at the bilateral achilles.  Plantar responses are downgoing bilaterally. Gait and Station: The patient is able to ambulate without difficulty. The patient has difficulty ambulating in a tandem fashion.  He is able to stand in the Romberg position.  MOVEMENT EXAM: Tremor:  There is a nonrhythmic resting tremor, with occasional myoclonic features.  Resting tremor is present in both upper extremities, but is independent of one another.  He has some tremor in the legs as well, but that is much less frequent.  He has tremor of both outstretched hands that is worse in the wing beating position and the right is much more significant than the left.  He has very significant difficulty with Archimedes spirals bilaterally.  He can hardly get depended the paper.  He spills water all over when asked to pour one water from one glass to another.  It does not matter which hand the full glass of water is in.  LABS:    Chemistry      Component Value Date/Time   NA 138 10/26/2015 1026   K 5.2 10/26/2015 1026   CL 97 10/26/2015 1026   CO2 26 10/26/2015 1026   BUN 18 10/26/2015 1026   CREATININE 1.32 (H) 10/26/2015 1026   CREATININE 1.09 06/27/2015 0816      Component Value Date/Time   CALCIUM 9.3 10/26/2015 1026   ALKPHOS 49 10/26/2015 1026   AST 8 10/26/2015 1026   ALT 12 10/26/2015 1026   BILITOT <0.2 10/26/2015 1026     No results found for: VITAMINB12  No results found for: TSH      Assessment/Plan:   1.  Essential Tremor.  -This is evidenced by the symmetrical nature and longstanding hx of gradually getting worse.  We discussed nature and pathophysiology. He has very severe disease.  We talked about primidone, but I also told him that this likely would not provide enough benefit to help him clinically.  We talked in detail about DBS therapy.  We discussed risks and benefits.  We discussed  extensively the risk of infection, particularly because he is a diabetic.  We discussed the surgery itself.  We discussed criteria for the surgery.  I offered to have him speak to the patients who have had the surgery at our center, but he wanted to hold on that.  He did want to proceed with neuropsych testing.  If he does well with that, he will need more detailed lab workup, including a TSH, repeat chemistry (renal insufficiency that has developed since March, 2017), copper.  Asked multiple questions and I answered them to the best of my ability.  -He is on gabapentin and that may be worsening the myoclonic features of his tremor, especially in the face of renal insufficiency. 2.  Diabetic peripheral neuropathy  -talked about safety 3.  He will follow up with [name] at his regularly scheduled visit.  Much greater than 50% of this visit was spent in counseling and coordinating care.  Total face to face time:  65 min

## 2015-11-17 ENCOUNTER — Ambulatory Visit (INDEPENDENT_AMBULATORY_CARE_PROVIDER_SITE_OTHER): Payer: PPO | Admitting: Psychology

## 2015-11-17 ENCOUNTER — Encounter: Payer: Self-pay | Admitting: Psychology

## 2015-11-17 DIAGNOSIS — R413 Other amnesia: Secondary | ICD-10-CM | POA: Diagnosis not present

## 2015-11-17 DIAGNOSIS — G250 Essential tremor: Secondary | ICD-10-CM

## 2015-11-17 NOTE — Progress Notes (Signed)
NEUROPSYCHOLOGICAL INTERVIEW (CPT: [id number])  Name: John Reese Date of Birth: Jul 18, 1950 Date of Interview: 11/17/2015  Reason for Referral:  John Reese is a 65 y.o., right-handed, married male who referred by Dr. Wells Guiles [name] to assess his current level of cognitive functioning and assist in differential diagnosis. This was also part of a pre-surgical evaluation for deep brain stimulation (DBS) to assist in the management of essential tremor.   History of Presenting Problem:  [name] has experienced tremor in his arms for several years, perhaps as many as 30+ years. He has a strong family history of essential tremor, including in his father and paternal grandfather. [name] and his wife reported that his tremor has gotten progressively worse over the past few months and is significantly interfering with his ability to perform many tasks. He retired one year ago secondary to his tremor. The patient is considering DBS for his essential tremor.  With regard to cognitive functioning, the patient's wife reported that she has noticed memory problems over approximately the past year. She reported gradual onset and progressive decline. She wonders if this memory decline is related to side effects of medications he is taking. Specific symptoms reported by the patient's wife include forgetfulness for recent conversations and events, repeating questions, word finding difficulty, reduced ability to comprehend others, and reduced concentration in conversation. He does have hearing loss and wears bilateral hearing aids. The patient and his wife denied forgetfulness for appointments or other obligations, forgetting to take medications, distractibility, and starting but not finishing tasks.  The patient continues to drive without any reported difficulties. He and his wife stated that he is very good with directions. He also manages his medications without any difficulty. His wife has always managed  their finances and appointments. As noted previously, the patient has been retired for just over a year. He continues to enjoy yard work.  The patient reported that his mood is good and stable. His wife reported that she sometimes thinks he is sad because he has less facial expression, but he denies any depression. She also reported some nervousness and anxiety, particularly in traffic and about being late for appointments. The patient denied any sleep difficulties. He denied hallucinations. His appetite is good and he reportedly eats a healthy diet. He was diagnosed with diabetes in his 31s. The patient endorsed mildly reduced balance. He has not had any falls.  The patient denied psychiatric history. He has never been treated for a mental health condition.  There is no known family history of dementia.   Social History: Born/Raised: [place] Education: High school graduate Occupational history: Previously worked in Psychologist, sport and exercise. Currently retired. Marital history: Married 32 years. 2 stepchildren. Alcohol/Tobacco/Substances: The patient denied history of substance abuse or dependence. He does not drink alcohol. He does not use tobacco. He has never been a cigarette smoker or tobacco user.  Medical History: Past Medical History:  Diagnosis Date  . Auditory disturbance    Decreased auditory acuity  . 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 Diabetes ([place])   . Dyslipidemia   . Tremor   . Trigeminal neuralgia    Right, V2 distribution      Current Medications:  Outpatient Encounter Prescriptions as of 11/17/2015  Medication Sig  . aspirin 81 MG tablet Take 81 mg by mouth daily.  . carbamazepine (TEGRETOL) 200 MG tablet TAKE 1 AND 1/2 TABLETS IN THE MORNING, 1 TABLET AT MIDDAY AND  1 AND 1/2 TABLETS IN THE EVENING (Patient taking differently: 1 tablet twice daily)  . CINNAMON PO Take 1,000 mg by mouth daily.  . clonazePAM  (KLONOPIN) 0.25 MG disintegrating tablet DISSOLVE 1 TABLET BY MOUTH TWICE A DAY  . fish oil-omega-3 fatty acids 1000 MG capsule Take 2 g by mouth daily.  Marland Kitchen gabapentin (NEURONTIN) 300 MG capsule TAKE 1 CAPSULE TWICE A DAY  . metFORMIN (GLUCOPHAGE) 500 MG tablet TAKE 1 TABLET (500 MG TOTAL) BY MOUTH 2 (TWO) TIMES DAILY WITH A MEAL.  . Multiple Vitamin (MULTIVITAMIN) tablet Take 1 tablet by mouth daily.  . pravastatin (PRAVACHOL) 40 MG tablet Take 40 mg by mouth daily.  . propranolol (INDERAL) 40 MG tablet TAKE 2 TABLETS (80 MG TOTAL) BY MOUTH 2 (TWO) TIMES DAILY.  . ramipril (ALTACE) 2.5 MG capsule Take 2.5 mg by mouth daily.  [name] 300 UNIT/ML SOPN Inject 15 Units as directed at bedtime.  Marland Kitchen ULTICARE MINI PEN NEEDLES 31G X 6 MM MISC USE AS DIRECTED AT BEDTIME (Patient not taking: Reported on 11/14/2015)   No facility-administered encounter medications on file as of 11/17/2015.      Behavioral Observations:   Appearance: Casually dressed, appropriately groomed, thin-appearing. Bilateral upper extremity resting tremor observed. Gait: Ambulated independently Speech: Fluent; normal rate, rhythm and volume Thought process: Appears linear Affect: Mildly blunted, but with euthymic mood Interpersonal: Pleasant, personable, appropriate   TESTING: There is medical necessity to proceed with neuropsychological assessment as the results will be used to aid in differential diagnosis and clinical decision-making and to inform specific treatment recommendations. Per the patient and his wife, there has been a change in cognitive functioning and a reasonable suspicion of mild cognitive disorder. Additionally, there is medical necessity to proceed with pre-surgical neuropsychological evaluation to inform whether the patient might safely proceed with a medical or surgical procedure that may affect brain function (i.e., deep brain stimulation).    PLAN: The patient will return for a full battery of  neuropsychological testing with a psychometrician under my supervision. Education regarding testing procedures was provided. Subsequently, the patient will see this provider for a follow-up session at which time his test performances and my impressions and treatment recommendations will be reviewed in detail.   Full neuropsychological evaluation report to follow.

## 2015-11-18 ENCOUNTER — Encounter: Payer: Self-pay | Admitting: "Endocrinology

## 2015-11-18 ENCOUNTER — Ambulatory Visit (INDEPENDENT_AMBULATORY_CARE_PROVIDER_SITE_OTHER): Payer: PPO | Admitting: "Endocrinology

## 2015-11-18 VITALS — BP 141/58 | HR 79 | Ht 73.0 in | Wt 156.0 lb

## 2015-11-18 DIAGNOSIS — I10 Essential (primary) hypertension: Secondary | ICD-10-CM | POA: Diagnosis not present

## 2015-11-18 DIAGNOSIS — Z794 Long term (current) use of insulin: Secondary | ICD-10-CM | POA: Diagnosis not present

## 2015-11-18 DIAGNOSIS — IMO0001 Reserved for inherently not codable concepts without codable children: Secondary | ICD-10-CM

## 2015-11-18 DIAGNOSIS — E785 Hyperlipidemia, unspecified: Secondary | ICD-10-CM | POA: Diagnosis not present

## 2015-11-18 DIAGNOSIS — E1165 Type 2 diabetes mellitus with hyperglycemia: Secondary | ICD-10-CM | POA: Diagnosis not present

## 2015-11-18 MED ORDER — METFORMIN HCL 500 MG PO TABS: 500.0000 mg | ORAL_TABLET | Freq: Two times a day (BID) | ORAL | 1 refills | Status: DC

## 2015-11-18 NOTE — Progress Notes (Signed)
Subjective:    Patient ID: John Reese, male    DOB: 1950-08-25, PCP [name], MD   Past Medical History:  Diagnosis Date  . Auditory disturbance    Decreased auditory acuity  . 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 Diabetes ([place])   . Dyslipidemia   . Tremor   . Trigeminal neuralgia    Right, V2 distribution   Past Surgical History:  Procedure Laterality Date  . FINGER AMPUTATION    . INNER EAR SURGERY Right    Ear drum repair   Social History   Social History  . Marital status: Married    Spouse name: N/A  . Number of children: 2  . Years of education: HS   Occupational History  . Works in Theatre manager Other   Social History Main Topics  . Smoking status: Never Smoker  . Smokeless tobacco: Never Used  . Alcohol use No  . Drug use: No  . Sexual activity: Not Asked   Other Topics Concern  . None   Social History Narrative   Patient is right handed.   Patient drinks 2 cups coffee daily.   Outpatient Encounter Prescriptions as of 11/18/2015  Medication Sig  . aspirin 81 MG tablet Take 81 mg by mouth daily.  . carbamazepine (TEGRETOL) 200 MG tablet TAKE 1 AND 1/2 TABLETS IN THE MORNING, 1 TABLET AT MIDDAY AND 1 AND 1/2 TABLETS IN THE EVENING (Patient taking differently: 1 tablet twice daily)  . CINNAMON PO Take 1,000 mg by mouth daily.  . clonazePAM (KLONOPIN) 0.25 MG disintegrating tablet DISSOLVE 1 TABLET BY MOUTH TWICE A DAY  . fish oil-omega-3 fatty acids 1000 MG capsule Take 2 g by mouth daily.  Marland Kitchen gabapentin (NEURONTIN) 300 MG capsule TAKE 1 CAPSULE TWICE A DAY  . metFORMIN (GLUCOPHAGE) 500 MG tablet Take 1 tablet (500 mg total) by mouth 2 (two) times daily with a meal.  . Multiple Vitamin (MULTIVITAMIN) tablet Take 1 tablet by mouth daily.  . pravastatin (PRAVACHOL) 40 MG tablet Take 40 mg by mouth daily.  . propranolol (INDERAL) 40 MG tablet TAKE 2 TABLETS (80 MG TOTAL) BY MOUTH 2 (TWO) TIMES  DAILY.  . ramipril (ALTACE) 2.5 MG capsule Take 2.5 mg by mouth daily.  [name] 300 UNIT/ML SOPN Inject 15 Units as directed at bedtime.  Marland Kitchen ULTICARE MINI PEN NEEDLES 31G X 6 MM MISC USE AS DIRECTED AT BEDTIME (Patient not taking: Reported on 11/14/2015)  . [DISCONTINUED] metFORMIN (GLUCOPHAGE) 500 MG tablet TAKE 1 TABLET (500 MG TOTAL) BY MOUTH 2 (TWO) TIMES DAILY WITH A MEAL.   No facility-administered encounter medications on file as of 11/18/2015.    ALLERGIES: No Known Allergies VACCINATION STATUS:  There is no immunization history on file for this patient.  Diabetes  He presents for his follow-up diabetic visit. He has type 2 diabetes mellitus. Onset time: He was diagnosed at approximate age of 50 years. His disease course has been stable (He is diagnosed with essential tremors since last visit, following with neurology.). There are no hypoglycemic associated symptoms. Pertinent negatives for hypoglycemia include no confusion, headaches, pallor or seizures. There are no diabetic associated symptoms. Pertinent negatives for diabetes include no chest pain, no fatigue, no polydipsia, no polyphagia, no polyuria and no weakness. There are no hypoglycemic complications. Symptoms are stable. There are no diabetic complications. Risk factors for coronary artery disease include diabetes mellitus, dyslipidemia, hypertension, male sex and  sedentary lifestyle. Current diabetic treatment includes insulin injections and oral agent (monotherapy). He is compliant with treatment most of the time. His weight is stable. He is following a generally healthy diet. When asked about meal planning, he reported none. He has had a previous visit with a dietitian. He participates in exercise intermittently. His home blood glucose trend is decreasing steadily. His breakfast blood glucose range is generally 130-140 mg/dl. An ACE inhibitor/angiotensin II receptor blocker is being taken.  Hyperlipidemia  This is a  chronic problem. The current episode started more than 1 year ago. Pertinent negatives include no chest pain, myalgias or shortness of breath. Risk factors for coronary artery disease include diabetes mellitus, dyslipidemia, hypertension, male sex and a sedentary lifestyle.  Hypertension  This is a chronic problem. The current episode started more than 1 year ago. Pertinent negatives include no chest pain, headaches, neck pain, palpitations or shortness of breath. Risk factors for coronary artery disease include dyslipidemia and diabetes mellitus. Past treatments include ACE inhibitors.     Review of Systems  Constitutional: Negative for fatigue and unexpected weight change.  HENT: Negative for dental problem, mouth sores and trouble swallowing.   Eyes: Negative for visual disturbance.  Respiratory: Negative for cough, choking, chest tightness, shortness of breath and wheezing.   Cardiovascular: Negative for chest pain, palpitations and leg swelling.  Gastrointestinal: Negative for abdominal distention, abdominal pain, constipation, diarrhea, nausea and vomiting.  Endocrine: Negative for polydipsia, polyphagia and polyuria.  Genitourinary: Negative for dysuria, flank pain, hematuria and urgency.  Musculoskeletal: Negative for back pain, gait problem, myalgias and neck pain.  Skin: Negative for pallor, rash and wound.  Neurological: Negative for seizures, syncope, weakness, numbness and headaches.  Psychiatric/Behavioral: Negative.  Negative for confusion and dysphoric mood.    Objective:    BP (!) 141/58   Pulse 79   Ht 6\' 1"  (1.854 m)   Wt 156 lb (70.8 kg)   BMI 20.58 kg/m   Wt Readings from Last 3 Encounters:  11/18/15 156 lb (70.8 kg)  11/14/15 159 lb (72.1 kg)  10/07/15 158 lb (71.7 kg)    Physical Exam  Constitutional: He is oriented to person, place, and time. He appears well-developed and well-nourished. He is cooperative. No distress.  HENT:  Head: Normocephalic and  atraumatic.  Eyes: EOM are normal.  Neck: Normal range of motion. Neck supple. No tracheal deviation present. No thyromegaly present.  Cardiovascular: Normal rate, S1 normal, S2 normal and normal heart sounds.  Exam reveals no gallop.   No murmur heard. Pulses:      Dorsalis pedis pulses are 2+ on the right side, and 2+ on the left side.       Posterior tibial pulses are 2+ on the right side, and 2+ on the left side.  Pulmonary/Chest: Breath sounds normal. No respiratory distress. He has no wheezes.  Abdominal: Soft. Bowel sounds are normal. He exhibits no distension. There is no tenderness. There is no guarding and no CVA tenderness.  Musculoskeletal: He exhibits no edema.       Right shoulder: He exhibits no swelling and no deformity.  Neurological: He is alert and oriented to person, place, and time. He has normal strength and normal reflexes. No cranial nerve deficit or sensory deficit. Coordination abnormal. Gait normal.  He has gross trembling of  his bilateral upper extremities, + tremor at rest.   Skin: Skin is warm and dry. No rash noted. No cyanosis. Nails show no clubbing.  Psychiatric: He has  a normal mood and affect. His speech is normal and behavior is normal. Judgment and thought content normal. Cognition and memory are normal.    Results for orders placed or performed in visit on 11/18/15  Hemoglobin A1c  Result Value Ref Range   Hemoglobin A1C 7.1    Diabetic Labs (most recent): Lab Results  Component Value Date   HGBA1C 7.1 09/30/2015   HGBA1C 6.9 (H) 06/27/2015   HGBA1C 6.6 (A) 03/28/2015     Assessment & Plan:   1. Uncontrolled type 2 diabetes mellitus without complication, with long-term current use of insulin (HCC) -No gross complications so far, however patient remains at a high risk for more acute and chronic complications of diabetes which include CAD, CVA, CKD, retinopathy, and neuropathy. These are all discussed in detail with the patient.  Patient  came with improved and controlled fasting glucose profile, and  recent A1c stable at  7.1%,  generally improving from 10.5 %.  Glucose logs and insulin administration records pertaining to this visit,  to be scanned into patient's records.  Recent labs reviewed.   - I have re-counseled the patient on diet management   by adopting a carbohydrate restricted / protein rich  Diet.  - Suggestion is made for patient to avoid simple carbohydrates   from their diet including Cakes , Desserts, Ice Cream,  Soda (  diet and regular) , Sweet Tea , Candies,  Chips, Cookies, Artificial Sweeteners,   and "Sugar-free" Products .  This will help patient to have stable blood glucose profile and potentially avoid unintended  Weight gain.  - Patient is advised to stick to a routine mealtimes to eat 3 meals  a day and avoid unnecessary snacks ( to snack only to correct hypoglycemia).  - The patient  has been  scheduled with Jearld Fenton, RDN, CDE for individualized DM education.  - I have approached patient with the following individualized plan to manage diabetes and patient agrees.  - He will need at least a basal insulin for  long term, currently Toujeo 15 units daily at bedtime , associated with monitoring BG at least at fasting.  -I will continue his metformin  500 mg by mouth twice a day. -he is allowed to increase his healthy carbs intake  to avoid unintended weight loss. - his c-peptide is critically low at 0.5, indicating pancreatic exhaustion.  - It is only a matter of time before he will require prandial insulin in addition to his basal insulin. - His GAD antibodies are negative, however does not rule out a possibility of LADA.  He is not a candidate for incretin therapy.  - Patient specific target  for A1c; LDL, HDL, Triglycerides, and  Waist Circumference were discussed in detail.  2) BP/HTN:  controlled. Continue current medications including ACEI/ARB. 3) Lipids/HPL:  continue statins. 4)   Weight/Diet: CDE consult in progress, exercise, and carbohydrates information provided.  5) Chronic Care/Health Maintenance:  -Patient is on ACEI/ARB and Statin medications and encouraged to continue to follow up with Ophthalmology, Podiatrist at least yearly or according to recommendations, and advised to  stay away from smoking. I have recommended yearly flu vaccine and pneumonia vaccination at least every 5 years; moderate intensity exercise for up to 150 minutes weekly; and  sleep for at least 7 hours a day. - Since his last visit, he was diagnosed with essential tremors. I advised him to continue follow-up with neurology. - 25 minutes of time was spent on the care of  this patient , 50% of which was applied for counseling on diabetes complications and their preventions.  - I advised patient to maintain close follow up with [name], MD for primary care needs.  Patient is asked to bring meter and  blood glucose logs during their next visit.   Follow up plan: -Return in about 3 months (around 02/18/2016) for follow up with pre-visit labs, meter, and logs.  Glade Arless, MD Phone: [phone]  Fax: [phone]   11/18/2015, 11:15 AM

## 2015-11-23 ENCOUNTER — Ambulatory Visit (INDEPENDENT_AMBULATORY_CARE_PROVIDER_SITE_OTHER): Payer: PPO | Admitting: Psychology

## 2015-11-23 ENCOUNTER — Other Ambulatory Visit: Payer: Self-pay | Admitting: Neurology

## 2015-11-23 DIAGNOSIS — G250 Essential tremor: Secondary | ICD-10-CM

## 2015-11-23 DIAGNOSIS — R413 Other amnesia: Secondary | ICD-10-CM

## 2015-11-23 NOTE — Telephone Encounter (Signed)
Rx printed, signed, faxed to pharmacy. 

## 2015-11-24 NOTE — Progress Notes (Signed)
   Neuropsychology Note  John Reese returned today for 2 hours of neuropsychological testing with technician, Milana Kidney, BS, under the supervision of Dr. Macarthur Critchley. The patient did not appear overtly distressed by the testing session, per behavioral observation or via self-report to the technician. Rest breaks were offered. John Reese will return within 2 weeks for a feedback session with Dr. Si Raider at which time his test performances, clinical impressions and treatment recommendations will be reviewed in detail. The patient understands he can contact our office should he require our assistance before this time.  Full report to follow.

## 2015-12-02 DIAGNOSIS — Z125 Encounter for screening for malignant neoplasm of prostate: Secondary | ICD-10-CM | POA: Diagnosis not present

## 2015-12-02 DIAGNOSIS — D509 Iron deficiency anemia, unspecified: Secondary | ICD-10-CM | POA: Diagnosis not present

## 2015-12-02 DIAGNOSIS — E784 Other hyperlipidemia: Secondary | ICD-10-CM | POA: Diagnosis not present

## 2015-12-02 DIAGNOSIS — R7309 Other abnormal glucose: Secondary | ICD-10-CM | POA: Diagnosis not present

## 2015-12-02 DIAGNOSIS — R799 Abnormal finding of blood chemistry, unspecified: Secondary | ICD-10-CM | POA: Diagnosis not present

## 2015-12-08 ENCOUNTER — Encounter: Payer: Self-pay | Admitting: Psychology

## 2015-12-08 ENCOUNTER — Ambulatory Visit (INDEPENDENT_AMBULATORY_CARE_PROVIDER_SITE_OTHER): Payer: PPO | Admitting: Psychology

## 2015-12-08 DIAGNOSIS — G250 Essential tremor: Secondary | ICD-10-CM

## 2015-12-08 DIAGNOSIS — G3184 Mild cognitive impairment, so stated: Secondary | ICD-10-CM

## 2015-12-08 DIAGNOSIS — R413 Other amnesia: Secondary | ICD-10-CM

## 2015-12-08 NOTE — Progress Notes (Signed)
NEUROPSYCHOLOGICAL EVALUATION   Name:    John Reese  Date of Birth:   [birth year] Date of Interview:  11/17/2015 Date of Testing:  11/23/2015   Date of Feedback:  12/08/2015       Background Information:  Reason for Referral:  John Reese is a 65 y.o., right-handed, married male referred by Dr. Wells Guiles [name] to assess his current level of cognitive functioning and assist in differential diagnosis. This was also part of a pre-surgical evaluation for deep brain stimulation (DBS) to assist in the management of essential tremor.The current evaluation consisted of a review of available medical records, an interview with the patient and his wife, and the completion of a neuropsychological testing battery. Informed consent was obtained.  History of Presenting Problem:  John Reese has experienced tremor in his arms for several years, perhaps as many as 30+ years. He has a strong family history of essential tremor, including in his father and paternal grandfather. John Reese and his wife reported that his tremor has gotten progressively worse over the past few months and is significantly interfering with his ability to perform many tasks. He retired one year ago secondary to his tremor. The patient is considering DBS for his essential tremor.  With regard to cognitive functioning, the patient's wife reported that she has noticed memory problems over approximately the past year. She reported gradual onset and progressive decline. She wonders if this memory decline is related to side effects of medications he is taking. Specific symptoms reported by the patient's wife include forgetfulness for recent conversations and events, repeating questions, word finding difficulty, reduced ability to comprehend others, and reduced concentration in conversation. He does have hearing loss and wears bilateral hearing aids. The patient and his wife denied forgetfulness for appointments or other obligations,  forgetting to take medications, distractibility, and starting but not finishing tasks.  The patient continues to drive without any reported difficulties. He and his wife stated that he is very good with directions. He also manages his medications without any difficulty. His wife has always managed their finances and appointments. As noted previously, the patient has been retired for just over a year. He continues to enjoy yard work.  The patient reported that his mood is good and stable. His wife reported that she sometimes thinks he is sad because he has less facial expression, but he denies any depression. She also reported some nervousness and anxiety, particularly in traffic and about being late for appointments. The patient denied any sleep difficulties. He denied hallucinations. His appetite is good and he reportedly eats a healthy diet. He was diagnosed with diabetes in his 54s. The patient endorsed mildly reduced balance. He has not had any falls.  The patient denied psychiatric history. He has never been treated for a mental health condition.  There is no known family history of dementia.   Social History: Born/Raised: [place] Education: High school graduate Occupational history: Previously worked in Psychologist, sport and exercise. Currently retired. Marital history: Married 32 years. 2 stepchildren. Alcohol/Tobacco/Substances: The patient denied history of substance abuse or dependence. He does not drink alcohol. He does not use tobacco. He has never been a cigarette smoker or tobacco user.   Medical History:  Past Medical History:  Diagnosis Date  . Auditory disturbance    Decreased auditory acuity  . 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 Diabetes ([place])   . Dyslipidemia   . Tremor   .  Trigeminal neuralgia    Right, V2 distribution    Current medications:  Outpatient Encounter Prescriptions as of 12/08/2015  Medication Sig   . aspirin 81 MG tablet Take 81 mg by mouth daily.  . carbamazepine (TEGRETOL) 200 MG tablet TAKE 1 AND 1/2 TABLETS IN THE MORNING, 1 TABLET AT MIDDAY AND 1 AND 1/2 TABLETS IN THE EVENING (Patient taking differently: 1 tablet twice daily)  . CINNAMON PO Take 1,000 mg by mouth daily.  . clonazePAM (KLONOPIN) 0.25 MG disintegrating tablet DISSOLVE 1 TABLET TWICE A DAY  . fish oil-omega-3 fatty acids 1000 MG capsule Take 2 g by mouth daily.  Marland Kitchen gabapentin (NEURONTIN) 300 MG capsule TAKE 1 CAPSULE TWICE A DAY  . metFORMIN (GLUCOPHAGE) 500 MG tablet Take 1 tablet (500 mg total) by mouth 2 (two) times daily with a meal.  . Multiple Vitamin (MULTIVITAMIN) tablet Take 1 tablet by mouth daily.  . pravastatin (PRAVACHOL) 40 MG tablet Take 40 mg by mouth daily.  . propranolol (INDERAL) 40 MG tablet TAKE 2 TABLETS (80 MG TOTAL) BY MOUTH 2 (TWO) TIMES DAILY.  . ramipril (ALTACE) 2.5 MG capsule Take 2.5 mg by mouth daily.  [name] 300 UNIT/ML SOPN Inject 15 Units as directed at bedtime.  Marland Kitchen ULTICARE MINI PEN NEEDLES 31G X 6 MM MISC USE AS DIRECTED AT BEDTIME (Patient not taking: Reported on 11/14/2015)   No facility-administered encounter medications on file as of 12/08/2015.      Current Examination:  Behavioral Observations:  Appearance: Casually dressed, appropriately groomed, thin-appearing. Bilateral upper extremity resting tremor observed. Gait: Ambulated independently Speech: Fluent; normal rate, rhythm and volume Thought process: Appears linear Affect: Mildly blunted, but with euthymic mood Interpersonal: Pleasant, personable, appropriate Orientation: Oriented to all spheres  Tests Administered: . Test of Premorbid Functioning (TOPF) . Wechsler Adult Intelligence Scale-Fourth Edition (WAIS-IV): Similarities,  Matrix Reasoning and Digit Span subtests . Wechsler Memory Scale-Fourth Edition (WMS-IV) Older Adult Version (ages 22-90): Logical Memory I, II and Recognition subtests   . [employer] Verbal Learning Test - 2nd Edition (CVLT-2) Short Form . Repeatable Battery for the Assessment of Neuropsychological Status (RBANS) Form A:  Figure Copy and Recall subtests . Neuropsychological Assessment Battery (NAB) Language Module, Form 1: Naming Subtest . Symbol Digit Modalities Test (SDMT) . Boston Diagnostic Aphasia Examination: Complex Ideational Material subtest . Controlled Oral Word Association Test (COWAT) . Trail Making Test A and B . Clock drawing test . Generalized Anxiety Disorder - 7 item screener (GAD-7) . Beck Depression Inventory - Second edition (BDI-II) . Parkinson's Disease Questionnaire (PDQ-39)  Test Scores: Note: Standardized scores are presented only for use by appropriately trained professionals and to allow for any future test-retest comparison. These scores should not be interpreted without consideration of all the information that is contained in the rest of the report. The most recent standardization samples from the test publisher or other sources were used whenever possible to derive standard scores; scores were corrected for age, gender, ethnicity and education when available.   Test Name Standardized Score Descriptor  TOPF SS= 78 Borderline  WAIS-IV Subtests    Similarities ss= 6 Low average  Matrix Reasoning ss= 7 Low average  Digit Span  ss= 11 Average  WMS-IV Subtests    LM I ss= 8 Average  LM II ss= 6 Low average  LM II recognition Cumulative percentage:  26-50 WNL  RBANS Subtests    Figure Copy Z= -0.7 Average  Figure Recall Z= -2.4 Impaired  CVLT-II Scores  Trial 1 Z= -1.5 Borderline  Trial 4 Z= -1 Low average  Trials 1-4 total T= 38 Low average  SD Free Recall Z= -1 Low average  LD Free Recall Z= -1 Low average  LD Cued Recall Z= -1 Low average  Recognition Discriminability (7/9 hits, 0 false positives) Z= 0 Average  NAB Naming T= 39 Low average  SDMT    Written Z= -1.4 Borderline  Oral Z= -0.6 Average  BDAE Subtest     Complex Ideational Material 9/12   COWAT-FAS T= 26 Impaired  COWAT-Animals T= 33 Borderline  Trail Making Test A 0 errors T= 13 Severely impaired  Trail Making Test B 1 error T= 9 Severely impaired  Clock Drawing  Generally WNL (when tremor is taken into account)  GAD-7 3/21 WNL   BDI-II 2 WNL  PDQ-39 (provided responses based on tremor)    Mobility  10%  Activities of daily living  41.6%  Emotional well being  8.3%  Stigma  18.75%  Social support  0  Cognitive impairment  43.75%  Communication  25%  Bodily discomfort  0         Summary and Clinical Impressions: Premorbid verbal intellectual abilities were estimated to have been within the borderline to low average range. Most performances on tests of other cognitive functions and domains were consistent with estimated level of premorbid functioning. Not surprisingly, he had significant difficulty on tasks requiring motor skills and dexterity (e.g., written SDMT, clock drawing, Trail Making Test). He did demonstrate significantly impaired verbal fluency. Reduced verbal fluency is commonly seen in patients with essential tremor. Verbal memory abilities were within normal limits and there was no evidence of hippocampal consolidation dysfunction. He clearly benefited from repetition of to-be-learned information. Performance on a test of non-verbal memory was impaired, but this was affected by his tremor. Overall, these test results are not consistent with a diagnosis of dementia at this time. A diagnosis of mild cognitive impairment is warranted. His cognitive profile is suggestive of mild fronto-cortical dysfunction. I do not suspect underlying Alzheimer's disease based on his pattern of test results. While up to 50% of individuals with essential tremor present with some mild cognitive impairment, this patient also has vascular risk factors (uncontrolled diabetes, HTN, hyperlipidemia) which could be a contributing factor.  There is no  evidence of significant depression or anxiety. The patient does report that his quality of life is negatively affected by essential tremor in that he has difficulty performing ADLs and does experience stigma secondary to his tremor.     IMPORTANT CONSIDERATIONS FOR DBS CANDIDACY: 1. IS THE PATIENT EXPERIENCING COGNITIVE SYMPTOMS THAT FAR EXCEED WHAT IS EXPECTED FOR ESSENTIAL TREMOR (ET)? The patient's areas of relative cognitive weakness are seen in up to 50% of patients with ET.   2. IS THERE A SEPARATE NEUROLOGICAL PROCESS AT WORK? It is possible that the patient is also experiencing mild vascular cognitive impairment. I do not see evidence of Alzheimer's disease at this time. His testing profile does not suggest impairment at the level of dementia.  3. WERE ANY PSYCHOSOCIAL STRESSORS IDENTIFIED WITHIN THE INDIVIDUAL AND/OR FAMILY BEYOND ET THAT MAY IMPACT UPON POST-OPERATIVE ADJUSTMENT? No. The patient is not endorsing any stressors (other than his tremor itself), and he does not demonstrate depression, anxiety or other mental health condition. He also has strong family support.  4. CAN THIS PERSON COPE WITH THE STRESS OF SURGERY AND BE COMPLIANT AS AN AWAKE PARTICIPANT IN SURGERY? The patient appears to manage  stress well and he has strong support. I do not see any reason from a psychological perspective that he would be unable to cope with the stress of the surgery and be compliant as an awake participant.   5. CAN THIS PERSON PARTICIPATE IN THE MULTIPLE POST-OPERATIVE PROGRAMMING SESSIONS AND MEDICATION ADJUSTMENTS? This patient would likely require the assistance of his wife to assist him in following through with all post-operative aspects, and she is happy to do this.  6. FROM A NEUROPSYCHOLOGICAL PERSPECTIVE, DOES THIS PERSON APPEAR TO BE A GOOD CANDIDATE FOR DBS? Yes.     Recommendations: Based on the findings of the present evaluation, the following recommendations were offered to the  patient:  1. Optimal control of vascular risk factors, including diabetes, was encouraged in order to reduce the risk of vascular cognitive impairment and to improve overall brain health. I provided written information on this topic to the patient and his wife. 2. Compensatory strategies to enhance cognitive functioning in daily life were also reviewed with the patient.  3. Re-evaluation in one year will assist in monitoring cognitive status, tracking any progression of symptoms and further assist with treatment planning.    Feedback to Patient: John Reese and his wife returned for a feedback appointment on 12/08/2015 to review the results of his neuropsychological evaluation with this provider. 30 minutes face-to-face time was spent reviewing his test results, my impressions and my recommendations as detailed above.    Total time spent on this patient's case: [id number] unit for interview with psychologist; [phone] units of testing by psychometrician under psychologist's supervision; [phone] units for medical record review, scoring of neuropsychological tests, interpretation of test results, preparation of this report, and review of results to the patient by psychologist.      Thank you for your referral of [facility]. Please feel free to contact me if you have any questions or concerns regarding this report.

## 2015-12-09 DIAGNOSIS — I10 Essential (primary) hypertension: Secondary | ICD-10-CM | POA: Diagnosis not present

## 2015-12-09 DIAGNOSIS — R251 Tremor, unspecified: Secondary | ICD-10-CM | POA: Diagnosis not present

## 2015-12-09 DIAGNOSIS — E119 Type 2 diabetes mellitus without complications: Secondary | ICD-10-CM | POA: Diagnosis not present

## 2015-12-09 DIAGNOSIS — E785 Hyperlipidemia, unspecified: Secondary | ICD-10-CM | POA: Diagnosis not present

## 2015-12-15 ENCOUNTER — Ambulatory Visit (INDEPENDENT_AMBULATORY_CARE_PROVIDER_SITE_OTHER): Payer: PPO | Admitting: Neurology

## 2015-12-15 ENCOUNTER — Encounter: Payer: Self-pay | Admitting: Neurology

## 2015-12-15 VITALS — BP 108/60 | HR 63 | Ht 73.0 in | Wt 156.0 lb

## 2015-12-15 DIAGNOSIS — Z01818 Encounter for other preprocedural examination: Secondary | ICD-10-CM | POA: Diagnosis not present

## 2015-12-15 DIAGNOSIS — G250 Essential tremor: Secondary | ICD-10-CM

## 2015-12-15 NOTE — Patient Instructions (Signed)
1. We are referring you to Dr Vertell Limber at [facility]. They will call you directly to set up an appointment date and time. If you do not hear from them they can be contacted directly at [phone].  2. We have sent a referral ro [facility] for your MRI and we will call you back with your appt.

## 2015-12-15 NOTE — Progress Notes (Signed)
Subjective:   John Reese was seen in consultation in the movement disorder clinic at the request of [name], NP.  His PCP is [name], MD.  The evaluation is for tremor.  This patient is accompanied in the office by his spouse who supplements the history. The records that were made available to me were reviewed. Wife thinks that he has had tremor for as long as they have been married, for over 30 years.  His father and paternal GF had tremor.   He estimates that he started seeing [name] 10 years ago but I only have records from the recent years.  He has been on klonopin for many years, 0.25mg  bid (never been on higher dosage) and is now on propranolol 80 mg bid as well for tremor.  Pt also has long hx of right trigeminal neuralgia and tried lyrica, 100mg  tid but it caused asterixis (pt states that he changed because of cost more than SE).  He is on tegretol but had hyponatremia with this in the past.  Is on gabapentin 300 mg bid.    Affected by caffeine:  No. (2 cups of coffee/day) Affected by alcohol: (doesn't drink EtOH) Affected by stress:  Yes.   Affected by fatigue:  No. Spills soup if on spoon:  Yes.   Spills glass of liquid if full:  Yes.   Affects ADL's (tying shoes, brushing teeth, etc):  Yes.   (shaves with electric now because of tremor; brushes teeth with both hands; doesn't wear many shoes with ties; wife has to give insulin shots)  12/15/15 update:  Pt f/u today.   He is accompanied by his wife who supplements the hx.   The records that were made available to me were reviewed.  He had neuropsych testing with Dr. Si Raider on 8/2 and f/u with her on 8/17.  There was no evidence of dementia and felt he would be a good DBS candidate.  Pt expresses desire to proceed with next step.  Expresses desire to do bilateral surgery due to severity of tremor.  He is R hand dominant.  Current/Previously tried tremor medications: klonopin, propranolol, lyrica (for TN),  gabapentin (for TN)  Current medications that may exacerbate tremor:  n/a  Outside reports reviewed: historical medical records, lab reports and office notes.  No Known Allergies  Outpatient Encounter Prescriptions as of 12/15/2015  Medication Sig  . aspirin 81 MG tablet Take 81 mg by mouth daily.  . carbamazepine (TEGRETOL) 200 MG tablet TAKE 1 AND 1/2 TABLETS IN THE MORNING, 1 TABLET AT MIDDAY AND 1 AND 1/2 TABLETS IN THE EVENING (Patient taking differently: 1 tablet twice daily)  . CINNAMON PO Take 1,000 mg by mouth daily.  . clonazePAM (KLONOPIN) 0.25 MG disintegrating tablet DISSOLVE 1 TABLET TWICE A DAY  . fish oil-omega-3 fatty acids 1000 MG capsule Take 2 g by mouth daily.  Marland Kitchen gabapentin (NEURONTIN) 300 MG capsule TAKE 1 CAPSULE TWICE A DAY  . metFORMIN (GLUCOPHAGE) 500 MG tablet Take 1 tablet (500 mg total) by mouth 2 (two) times daily with a meal.  . Multiple Vitamin (MULTIVITAMIN) tablet Take 1 tablet by mouth daily.  . pravastatin (PRAVACHOL) 40 MG tablet Take 40 mg by mouth daily.  . propranolol (INDERAL) 40 MG tablet TAKE 2 TABLETS (80 MG TOTAL) BY MOUTH 2 (TWO) TIMES DAILY.  . ramipril (ALTACE) 2.5 MG capsule Take 2.5 mg by mouth daily.  [name] 300 UNIT/ML SOPN Inject 15 Units as directed  at bedtime.  Marland Kitchen ULTICARE MINI PEN NEEDLES 31G X 6 MM MISC USE AS DIRECTED AT BEDTIME   No facility-administered encounter medications on file as of 12/15/2015.     Past Medical History:  Diagnosis Date  . Auditory disturbance    Decreased auditory acuity  . 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 Diabetes ([place])   . Dyslipidemia   . Tremor   . Trigeminal neuralgia    Right, V2 distribution    Past Surgical History:  Procedure Laterality Date  . FINGER AMPUTATION    . INNER EAR SURGERY Right    Ear drum repair    Social History   Social History  . Marital status: Married    Spouse name: N/A  . Number of children: 2  .  Years of education: HS   Occupational History  . Works in Theatre manager Other   Social History Main Topics  . Smoking status: Never Smoker  . Smokeless tobacco: Never Used  . Alcohol use No  . Drug use: No  . Sexual activity: Not on file   Other Topics Concern  . Not on file   Social History Narrative   Patient is right handed.   Patient drinks 2 cups coffee daily.    Family Status  Relation Status  . Father Deceased at age 55  . Sister Alive  . Brother Deceased  . Mother Alive  . Brother Alive  . Brother Alive  . Brother Alive  . Brother Alive  . Sister Alive    Review of Systems A complete 10 system ROS was obtained and was negative apart from what is mentioned.   Objective:   VITALS:   Vitals:   12/15/15 1450  BP: 108/60  Pulse: 63  Weight: 156 lb (70.8 kg)  Height: 6\' 1"  (1.854 m)   Gen:  Appears stated age and in NAD. HEENT:  Normocephalic, atraumatic. The mucous membranes are moist. The superficial temporal arteries are without ropiness or tenderness. Cardiovascular: Regular rate and rhythm. Lungs: Clear to auscultation bilaterally. Neck: There are no carotid bruits noted bilaterally.  NEUROLOGICAL:  Orientation:  The patient is alert and oriented x 3.   Cranial nerves: There is good facial symmetry.  Speech is fluent and clear. Soft palate rises symmetrically and there is no tongue deviation. Hearing is intact to conversational tone. Tone: Tone is good throughout. Sensation: Sensation is intact to light touch Coordination:  The patient has no dysdiadichokinesia or dysmetria. Motor: Strength is 5/5 in the bilateral upper and lower extremities.  Shoulder shrug is equal bilaterally.  There is no pronator drift.  There are no fasciculations noted. Gait and Station: The patient is able to ambulate without difficulty.   MOVEMENT EXAM: Tremor:  There is a nonrhythmic resting tremor, with occasional myoclonic features.  Resting tremor is present in both  upper extremities, but is independent of one another.  He has some tremor in the legs as well, but that is much less frequent.  He has tremor of both outstretched hands that is worse in the wing beating position and the right is much more significant than the left.    LABS:    Chemistry      Component Value Date/Time   NA 138 10/26/2015 1026   K 5.2 10/26/2015 1026   CL 97 10/26/2015 1026   CO2 26 10/26/2015 1026   BUN 18 10/26/2015 1026   CREATININE 1.32 (H) 10/26/2015 1026  CREATININE 1.09 06/27/2015 0816      Component Value Date/Time   CALCIUM 9.3 10/26/2015 1026   ALKPHOS 49 10/26/2015 1026   AST 8 10/26/2015 1026   ALT 12 10/26/2015 1026   BILITOT <0.2 10/26/2015 1026     No results found for: VITAMINB12  No results found for: TSH      Assessment/Plan:   1.  Essential Tremor.  -I talked to the patient about the logistics associated with DBS therapy.  I talked to the patient about risks/benefits/side effects of DBS therapy.  We talked about risks which included but were not limited to infection, paralysis, intraoperative seizure, death, stroke, bleeding around the electrode.   I talked to patient about fiducial placement 1 week prior to DBS therapy.  I talked to the patient about what to expect in the operating room, including the fact that this is an awake surgery.  We talked about battery placement as well as which is done under general anesthesia, generally approximately one week following the initial surgery.  We also talked about the fact that the patient will need to be off of medications for surgery.  The patient and family were given the opportunity to ask questions, which they did, and I answered them to the best of my ability today.  He wants a bilateral sx and understands there is a risk of loss of balance with this.  Will refer to Dr. Vertell Limber for c/s and do pre-op MRI. 2.  Diabetic peripheral neuropathy  -talked about safety 3.  He will follow up with [name] at  his regularly scheduled visit.  Much greater than 50% of this visit was spent in counseling and coordinating care.  Total face to face time:  30 min.  Will do pre-op video next visit if continue to express desire to proceed.

## 2015-12-16 ENCOUNTER — Telehealth: Payer: Self-pay | Admitting: Neurology

## 2015-12-16 NOTE — Telephone Encounter (Signed)
Referral faxed to  Neurosurgery at [phone] with confirmation received. They will contact the patient to schedule.  

## 2015-12-28 ENCOUNTER — Ambulatory Visit (HOSPITAL_COMMUNITY): Admission: RE | Admit: 2015-12-28 | Payer: PPO | Source: Ambulatory Visit

## 2015-12-29 ENCOUNTER — Telehealth: Payer: Self-pay | Admitting: Neurology

## 2015-12-29 NOTE — Telephone Encounter (Signed)
hayleigh from dr Donald Pore office got patient on with dr Vertell Limber on 01-23-16

## 2015-12-30 ENCOUNTER — Ambulatory Visit (HOSPITAL_COMMUNITY)
Admission: RE | Admit: 2015-12-30 | Discharge: 2015-12-30 | Disposition: A | Discharge status: Home / Self Care | Payer: PPO | Source: Ambulatory Visit | Attending: Neurology | Admitting: Neurology

## 2015-12-30 ENCOUNTER — Telehealth: Payer: Self-pay | Admitting: Neurology

## 2015-12-30 DIAGNOSIS — R251 Tremor, unspecified: Secondary | ICD-10-CM | POA: Diagnosis not present

## 2015-12-30 DIAGNOSIS — G319 Degenerative disease of nervous system, unspecified: Secondary | ICD-10-CM | POA: Insufficient documentation

## 2015-12-30 DIAGNOSIS — G250 Essential tremor: Secondary | ICD-10-CM | POA: Diagnosis not present

## 2015-12-30 LAB — CREATININE, SERUM
Creatinine, Ser: 0.97 mg/dL (ref 0.61–1.24)
GFR calc non Af Amer: >60 mL/min (ref >60)

## 2015-12-30 MED ORDER — GADOBENATE DIMEGLUMINE 529 MG/ML IV SOLN
15.0000 mL | Freq: Once | INTRAVENOUS | Status: AC | PRN
  Administered 2015-12-30: 15 mL via INTRAVENOUS

## 2015-12-30 NOTE — Telephone Encounter (Signed)
-----   Message from [place], DO sent at 12/30/2015  4:04 PM EDT ----- [name], tell patient that images looked good.  Also will you call MRI and thank the tech.  Image quality is really good!

## 2015-12-30 NOTE — Telephone Encounter (Signed)
Patient's wife made aware.   Spoke with MR department and they were made aware.

## 2016-01-20 ENCOUNTER — Other Ambulatory Visit: Payer: Self-pay

## 2016-01-20 MED ORDER — TOUJEO SOLOSTAR 300 UNIT/ML ~~LOC~~ SOPN
15.0000 [IU] | PEN_INJECTOR | Freq: Every day | SUBCUTANEOUS | 2 refills | Status: DC
Start: 2016-01-20 — End: 2016-06-29

## 2016-01-23 DIAGNOSIS — G250 Essential tremor: Secondary | ICD-10-CM | POA: Diagnosis not present

## 2016-01-26 ENCOUNTER — Encounter: Payer: Self-pay | Admitting: Adult Health

## 2016-01-26 ENCOUNTER — Ambulatory Visit (INDEPENDENT_AMBULATORY_CARE_PROVIDER_SITE_OTHER): Payer: PPO | Admitting: Adult Health

## 2016-01-26 VITALS — BP 122/70 | HR 60 | Ht 73.0 in | Wt 157.6 lb

## 2016-01-26 DIAGNOSIS — G500 Trigeminal neuralgia: Secondary | ICD-10-CM | POA: Diagnosis not present

## 2016-01-26 DIAGNOSIS — G250 Essential tremor: Secondary | ICD-10-CM | POA: Diagnosis not present

## 2016-01-26 MED ORDER — GABAPENTIN 400 MG PO CAPS: 400.0000 mg | ORAL_CAPSULE | Freq: Two times a day (BID) | ORAL | 5 refills | Status: DC

## 2016-01-26 NOTE — Patient Instructions (Signed)
Increase gabapentin to 400 mg twice a day Continue Carbamazepine If your symptoms worsen or you develop new symptoms please let us know.

## 2016-01-26 NOTE — Progress Notes (Signed)
I have read the note, and I agree with the clinical assessment and plan.  John Reese,John Reese   

## 2016-01-26 NOTE — Progress Notes (Signed)
PATIENT: John Reese DOB: [birth year]  REASON FOR VISIT: follow up- essential tremor, trigeminal neuralgia HISTORY FROM: patient  HISTORY OF PRESENT ILLNESS: John Reese is a 65 year old male with a history of essential tremor and trigeminal neuralgia. He returns today for follow-up. He did follow up with Dr. Carles Collet and has decided to proceed with placement of deep brain stimulator. Patient reports that about 3 weeks ago his facial pain returned on the right. He states this started right after he had his wisdom teeth removed. He has since then back to the dentist and they reported that he was "healing nicely." The patient states since then he's had sharp shooting pains from the eye down into the cheek on the right side of the face. He states that for the last 3 weeks he has some days with severe pain other days the pain has been more manageable. He has a hard time eating food when the pain is severe. He remains on carbamazepine and gabapentin. He states he did take an extra tablet of gabapentin yesterday and he was beneficial for the pain. He returns today for an evaluation.   HISTORY 10/26/15: John Reese is a 65 year old male with a history of essential tremor. He returns today for follow-up. He is on propranolol 80 mg twice a day. He states that he has noticed minimal benefit. Patient has trouble feeding himself and writing. He states that now his wife signs everything for him. He is able to dress himself. His wife has noticed tremor in the legs as well. Wife states that when the tremor becomes severe it as if the whole arm shaking not just the hands. The patient is interested in the deep brain stimulator. He is also on carbamazepine and gabapentin for trigeminal neuralgia. He reports that this primarily controls his symptoms occasionally he'll have a flareup. In the past blood work has shown a low sodium level most likely related to carbamazepine. He returns today for an evaluation.  HISTORY  04/13/15: John Reese is a 65 year old right-handed white male with a history of an essential tremor affecting both upper extremities. The patient has trigeminal neuralgia affecting the right V2 distribution. The tremor has continued to be a problem, he was increased on the propranolol when last seen, but this offered minimal benefit. The patient essentially is unable to perform handwriting, he has had significant difficulty feeding himself. The patient does have some troubles with slurred speech at times, likely associated with use of medications. The patient has had some mild cognitive slowing as well. He has retired at this point. The patient has not had much issue with his trigeminal neuralgia, he had a brief bout of pain 3 months ago, but this has resolved. The patient denies any significant issues with balance, no falls. He returns to the office today for an evaluation.   REVIEW OF SYSTEMS: Out of a complete 14 system review of symptoms, the patient complains only of the following symptoms, and all other reviewed systems are negative.  See history of present illness ALLERGIES: No Known Allergies  HOME MEDICATIONS: Outpatient Medications Prior to Visit  Medication Sig Dispense Refill  . aspirin 81 MG tablet Take 81 mg by mouth daily.    . carbamazepine (TEGRETOL) 200 MG tablet TAKE 1 AND 1/2 TABLETS IN THE MORNING, 1 TABLET AT MIDDAY AND 1 AND 1/2 TABLETS IN THE EVENING (Patient taking differently: 1 tablet twice daily) 360 tablet 3  . CINNAMON PO Take 1,000 mg by mouth daily.    Marland Kitchen  clonazePAM (KLONOPIN) 0.25 MG disintegrating tablet DISSOLVE 1 TABLET TWICE A DAY 60 tablet 5  . fish oil-omega-3 fatty acids 1000 MG capsule Take 2 g by mouth daily.    Marland Kitchen gabapentin (NEURONTIN) 300 MG capsule TAKE 1 CAPSULE TWICE A DAY 180 capsule 0  . metFORMIN (GLUCOPHAGE) 500 MG tablet Take 1 tablet (500 mg total) by mouth 2 (two) times daily with a meal. 180 tablet 1  . Multiple Vitamin (MULTIVITAMIN) tablet  Take 1 tablet by mouth daily.    . pravastatin (PRAVACHOL) 40 MG tablet Take 40 mg by mouth daily.    . propranolol (INDERAL) 40 MG tablet TAKE 2 TABLETS (80 MG TOTAL) BY MOUTH 2 (TWO) TIMES DAILY. 360 tablet 3  . ramipril (ALTACE) 2.5 MG capsule Take 2.5 mg by mouth daily.    [name] 300 UNIT/ML SOPN Inject 15 Units as directed at bedtime. 15 mL 2  . ULTICARE MINI PEN NEEDLES 31G X 6 MM MISC USE AS DIRECTED AT BEDTIME 100 each 2   No facility-administered medications prior to visit.     PAST MEDICAL HISTORY: Past Medical History:  Diagnosis Date  . Auditory disturbance    Decreased auditory acuity  . 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 Diabetes ([place])   . Dyslipidemia   . Tremor   . Trigeminal neuralgia    Right, V2 distribution    PAST SURGICAL HISTORY: Past Surgical History:  Procedure Laterality Date  . FINGER AMPUTATION    . INNER EAR SURGERY Right    Ear drum repair    FAMILY HISTORY: Family History  Problem Relation Age of Onset  . Stroke Father   . Peripheral vascular disease Sister   . Heart attack Brother   . Stroke Mother     SOCIAL HISTORY: Social History   Social History  . Marital status: Married    Spouse name: N/A  . Number of children: 2  . Years of education: HS   Occupational History  . Works in Theatre manager Other   Social History Main Topics  . Smoking status: Never Smoker  . Smokeless tobacco: Never Used  . Alcohol use No  . Drug use: No  . Sexual activity: Not on file   Other Topics Concern  . Not on file   Social History Narrative   Patient is right handed.   Patient drinks 2 cups coffee daily.      PHYSICAL EXAM  Vitals:   01/26/16 1247  BP: 122/70  Pulse: 60  Weight: 157 lb 9.6 oz (71.5 kg)  Height: 6\' 1"  (1.854 m)   Body mass index is 20.79 kg/m.  Generalized: Well developed, in no acute distress   Neurological examination  Mentation: Alert oriented to  time, place, history taking. Follows all commands speech and language fluent Cranial nerve II-XII: Pupils were equal round reactive to light. Extraocular movements were full, visual field were full on confrontational test. Facial sensation and strength were normal. Uvula tongue midline. Head turning and shoulder shrug  were normal and symmetric. Motor: The motor testing reveals 5 over 5 strength of all 4 extremities. Good symmetric motor tone is noted throughout. Intention tremor noted in both hands. Sensory: Sensory testing is intact to soft touch on all 4 extremities. No evidence of extinction is noted.  Coordination: Cerebellar testing reveals good finger-nose-finger and heel-to-shin bilaterally.  Gait and station: Gait is normal. Reflexes: Deep tendon reflexes are symmetric and normal bilaterally.  DIAGNOSTIC DATA (LABS, IMAGING, TESTING) - I reviewed patient records, labs, notes, testing and imaging myself where available.  Lab Results  Component Value Date   WBC 4.5 10/26/2015   HGB 13.6 02/23/2014   HCT 37.2 (L) 10/26/2015   MCV 91 10/26/2015   PLT 202 10/26/2015      Component Value Date/Time   NA 138 10/26/2015 1026   K 5.2 10/26/2015 1026   CL 97 10/26/2015 1026   CO2 26 10/26/2015 1026   GLUCOSE 158 (H) 10/26/2015 1026   GLUCOSE 129 (H) 06/27/2015 0816   BUN 18 10/26/2015 1026   CREATININE 0.97 12/30/2015 1153   CREATININE 1.09 06/27/2015 0816   CALCIUM 9.3 10/26/2015 1026   PROT 6.2 10/26/2015 1026   ALBUMIN 4.2 10/26/2015 1026   AST 8 10/26/2015 1026   ALT 12 10/26/2015 1026   ALKPHOS 49 10/26/2015 1026   BILITOT <0.2 10/26/2015 1026   GFRNONAA >60 12/30/2015 1153   GFRAA >60 12/30/2015 1153   Lab Results  Component Value Date   CHOL 175 06/27/2015   HDL 42 06/27/2015   LDLCALC 113 06/27/2015   TRIG 99 06/27/2015   CHOLHDL 4.2 06/27/2015   Lab Results  Component Value Date   HGBA1C 7.1 09/30/2015     ASSESSMENT AND PLAN 65 y.o. 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 here with:  1. Trigeminal neuralgia on the right 2. Essential tremor  The patient has had increased facial pain in the last 3 weeks. I will increase gabapentin to 400 mg twice a day. He will continue on his current dose of carbamazepine. He had blood work checked in July that was unremarkable. He is currently waiting for Dr. Melven Sartorius office to set up a surgery date for deep brain stimulators. Patient advised that if the increase in gabapentin is not beneficial he should let us know. He will follow-up in 6 months or sooner if needed.     [name], MSN, NP-C 01/26/2016, 1:00 PM [facility] [address] [place], [place] [address] [phone]

## 2016-01-27 ENCOUNTER — Other Ambulatory Visit (HOSPITAL_COMMUNITY): Payer: Self-pay | Admitting: Neurosurgery

## 2016-01-27 ENCOUNTER — Other Ambulatory Visit: Payer: Self-pay | Admitting: Neurosurgery

## 2016-01-27 DIAGNOSIS — G250 Essential tremor: Secondary | ICD-10-CM

## 2016-02-01 ENCOUNTER — Ambulatory Visit: Payer: PPO | Admitting: Adult Health

## 2016-02-01 DIAGNOSIS — Z23 Encounter for immunization: Secondary | ICD-10-CM | POA: Diagnosis not present

## 2016-02-06 ENCOUNTER — Telehealth: Payer: Self-pay | Admitting: Adult Health

## 2016-02-06 MED ORDER — GABAPENTIN 400 MG PO CAPS: 400.0000 mg | ORAL_CAPSULE | Freq: Three times a day (TID) | ORAL | 5 refills | Status: DC

## 2016-02-06 NOTE — Telephone Encounter (Signed)
Pt's wife called stating pt is still having some facial pain. Can the dosage of gabapentin (NEURONTIN) 400 MG capsule be increased ? Please call and advise [phone]

## 2016-02-06 NOTE — Telephone Encounter (Signed)
I called the patient and spoke to his wife who is on his deep ER form. She reports that he still has pain throughout the day. He often takes a 300 mg capsule of gabapentin in addition to the 400 mg twice a day. I advised the patient to increase to 400 mg 3 times a day she verbalized understanding. A new prescription will be sent.

## 2016-02-17 ENCOUNTER — Other Ambulatory Visit: Payer: Self-pay | Admitting: "Endocrinology

## 2016-02-17 DIAGNOSIS — E1165 Type 2 diabetes mellitus with hyperglycemia: Secondary | ICD-10-CM | POA: Diagnosis not present

## 2016-02-17 DIAGNOSIS — Z794 Long term (current) use of insulin: Secondary | ICD-10-CM | POA: Diagnosis not present

## 2016-02-17 LAB — COMPLETE METABOLIC PANEL WITH GFR
ALT: 19 U/L (ref 9–46)
AST: 16 U/L (ref 10–35)
Albumin: 4.4 g/dL (ref 3.6–5.1)
Alkaline Phosphatase: 46 U/L (ref 40–115)
BUN: 19 mg/dL (ref 7–25)
CALCIUM: 9.6 mg/dL (ref 8.6–10.3)
CHLORIDE: 96 mmol/L — AB (ref 98–110)
CO2: 29 mmol/L (ref 20–31)
CREATININE: 1.09 mg/dL (ref 0.70–1.25)
GFR, Est African American: 81 mL/min (ref >=60)
GFR, Est Non African American: 70 mL/min (ref >=60)
GLUCOSE: 119 mg/dL — AB (ref 65–99)
POTASSIUM: 5.7 mmol/L — AB (ref 3.5–5.3)
SODIUM: 132 mmol/L — AB (ref 135–146)
Total Bilirubin: 0.4 mg/dL (ref 0.2–1.2)
Total Protein: 6.5 g/dL (ref 6.1–8.1)

## 2016-02-17 LAB — HEMOGLOBIN A1C
Hgb A1c MFr Bld: 6.5 % — ABNORMAL HIGH (ref <5.7)
Mean Plasma Glucose: 140 mg/dL

## 2016-02-24 ENCOUNTER — Encounter: Payer: Self-pay | Admitting: "Endocrinology

## 2016-02-24 ENCOUNTER — Ambulatory Visit (INDEPENDENT_AMBULATORY_CARE_PROVIDER_SITE_OTHER): Payer: PPO | Admitting: "Endocrinology

## 2016-02-24 VITALS — BP 138/82 | HR 71 | Ht 73.0 in | Wt 160.0 lb

## 2016-02-24 DIAGNOSIS — Z794 Long term (current) use of insulin: Secondary | ICD-10-CM | POA: Diagnosis not present

## 2016-02-24 DIAGNOSIS — E782 Mixed hyperlipidemia: Secondary | ICD-10-CM | POA: Diagnosis not present

## 2016-02-24 DIAGNOSIS — I10 Essential (primary) hypertension: Secondary | ICD-10-CM | POA: Diagnosis not present

## 2016-02-24 DIAGNOSIS — IMO0001 Reserved for inherently not codable concepts without codable children: Secondary | ICD-10-CM

## 2016-02-24 DIAGNOSIS — E1165 Type 2 diabetes mellitus with hyperglycemia: Secondary | ICD-10-CM | POA: Diagnosis not present

## 2016-02-24 NOTE — Progress Notes (Signed)
Subjective:    Patient ID: John Reese, male    DOB: February 24, 1951, PCP [name], MD   Past Medical History:  Diagnosis Date  . Auditory disturbance    Decreased auditory acuity  . 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 Diabetes ([place])   . Dyslipidemia   . Tremor   . Trigeminal neuralgia    Right, V2 distribution   Past Surgical History:  Procedure Laterality Date  . FINGER AMPUTATION    . INNER EAR SURGERY Right    Ear drum repair   Social History   Social History  . Marital status: Married    Spouse name: N/A  . Number of children: 2  . Years of education: HS   Occupational History  . Works in Theatre manager Other   Social History Main Topics  . Smoking status: Never Smoker  . Smokeless tobacco: Never Used  . Alcohol use No  . Drug use: No  . Sexual activity: Not Asked   Other Topics Concern  . None   Social History Narrative   Patient is right handed.   Patient drinks 2 cups coffee daily.   Outpatient Encounter Prescriptions as of 02/24/2016  Medication Sig  . aspirin 81 MG tablet Take 81 mg by mouth daily.  . carbamazepine (TEGRETOL) 200 MG tablet TAKE 1 AND 1/2 TABLETS IN THE MORNING, 1 TABLET AT MIDDAY AND 1 AND 1/2 TABLETS IN THE EVENING (Patient taking differently: 1 tablet twice daily)  . CINNAMON PO Take 1,000 mg by mouth daily.  . clonazePAM (KLONOPIN) 0.25 MG disintegrating tablet DISSOLVE 1 TABLET TWICE A DAY  . fish oil-omega-3 fatty acids 1000 MG capsule Take 2 g by mouth daily.  Marland Kitchen gabapentin (NEURONTIN) 400 MG capsule Take 1 capsule (400 mg total) by mouth 3 (three) times daily.  . metFORMIN (GLUCOPHAGE) 500 MG tablet Take 1 tablet (500 mg total) by mouth 2 (two) times daily with a meal.  . Multiple Vitamin (MULTIVITAMIN) tablet Take 1 tablet by mouth daily.  . pravastatin (PRAVACHOL) 40 MG tablet Take 40 mg by mouth daily.  . propranolol (INDERAL) 40 MG tablet TAKE 2 TABLETS (80 MG  TOTAL) BY MOUTH 2 (TWO) TIMES DAILY.  . ramipril (ALTACE) 2.5 MG capsule Take 2.5 mg by mouth daily.  [name] 300 UNIT/ML SOPN Inject 15 Units as directed at bedtime.  Marland Kitchen ULTICARE MINI PEN NEEDLES 31G X 6 MM MISC USE AS DIRECTED AT BEDTIME   No facility-administered encounter medications on file as of 02/24/2016.    ALLERGIES: No Known Allergies VACCINATION STATUS:  There is no immunization history on file for this patient.  Diabetes  He presents for his follow-up diabetic visit. He has type 2 diabetes mellitus. Onset time: He was diagnosed at approximate age of 47 years. His disease course has been improving (He is diagnosed with essential tremors since last visit, following with neurology.). There are no hypoglycemic associated symptoms. Pertinent negatives for hypoglycemia include no confusion, headaches, pallor or seizures. There are no diabetic associated symptoms. Pertinent negatives for diabetes include no chest pain, no fatigue, no polydipsia, no polyphagia, no polyuria and no weakness. There are no hypoglycemic complications. Symptoms are improving. There are no diabetic complications. Risk factors for coronary artery disease include diabetes mellitus, dyslipidemia, hypertension, male sex and sedentary lifestyle. Current diabetic treatment includes insulin injections and oral agent (monotherapy). He is compliant with treatment most of the time. His weight is increasing  steadily. He is following a generally healthy diet. When asked about meal planning, he reported none. He has had a previous visit with a dietitian. He participates in exercise intermittently. His home blood glucose trend is decreasing steadily. His breakfast blood glucose range is generally 130-140 mg/dl. His overall blood glucose range is 130-140 mg/dl. An ACE inhibitor/angiotensin II receptor blocker is being taken.  Hyperlipidemia  This is a chronic problem. The current episode started more than 1 year ago.  Pertinent negatives include no chest pain, myalgias or shortness of breath. Risk factors for coronary artery disease include diabetes mellitus, dyslipidemia, hypertension, male sex and a sedentary lifestyle.  Hypertension  This is a chronic problem. The current episode started more than 1 year ago. Pertinent negatives include no chest pain, headaches, neck pain, palpitations or shortness of breath. Risk factors for coronary artery disease include dyslipidemia and diabetes mellitus. Past treatments include ACE inhibitors.     Review of Systems  Constitutional: Negative for fatigue and unexpected weight change.  HENT: Negative for dental problem, mouth sores and trouble swallowing.   Eyes: Negative for visual disturbance.  Respiratory: Negative for cough, choking, chest tightness, shortness of breath and wheezing.   Cardiovascular: Negative for chest pain, palpitations and leg swelling.  Gastrointestinal: Negative for abdominal distention, abdominal pain, constipation, diarrhea, nausea and vomiting.  Endocrine: Negative for polydipsia, polyphagia and polyuria.  Genitourinary: Negative for dysuria, flank pain, hematuria and urgency.  Musculoskeletal: Negative for back pain, gait problem, myalgias and neck pain.  Skin: Negative for pallor, rash and wound.  Neurological: Negative for seizures, syncope, weakness, numbness and headaches.  Psychiatric/Behavioral: Negative.  Negative for confusion and dysphoric mood.    Objective:    BP 138/82   Pulse 71   Ht 6\' 1"  (1.854 m)   Wt 160 lb (72.6 kg)   BMI 21.11 kg/m   Wt Readings from Last 3 Encounters:  02/24/16 160 lb (72.6 kg)  01/26/16 157 lb 9.6 oz (71.5 kg)  12/15/15 156 lb (70.8 kg)    Physical Exam  Constitutional: He is oriented to person, place, and time. He appears well-developed and well-nourished. He is cooperative. No distress.  HENT:  Head: Normocephalic and atraumatic.  Eyes: EOM are normal.  Neck: Normal range of motion.  Neck supple. No tracheal deviation present. No thyromegaly present.  Cardiovascular: Normal rate, S1 normal, S2 normal and normal heart sounds.  Exam reveals no gallop.   No murmur heard. Pulses:      Dorsalis pedis pulses are 2+ on the right side, and 2+ on the left side.       Posterior tibial pulses are 2+ on the right side, and 2+ on the left side.  Pulmonary/Chest: Breath sounds normal. No respiratory distress. He has no wheezes.  Abdominal: Soft. Bowel sounds are normal. He exhibits no distension. There is no tenderness. There is no guarding and no CVA tenderness.  Musculoskeletal: He exhibits no edema.       Right shoulder: He exhibits no swelling and no deformity.  Neurological: He is alert and oriented to person, place, and time. He has normal strength and normal reflexes. No cranial nerve deficit or sensory deficit. Coordination abnormal. Gait normal.  He has gross trembling of  his bilateral upper extremities, + tremor at rest.   Skin: Skin is warm and dry. No rash noted. No cyanosis. Nails show no clubbing.  Psychiatric: He has a normal mood and affect. His speech is normal and behavior is normal. Judgment and  thought content normal. Cognition and memory are normal.    Results for orders placed or performed in visit on 02/17/16  COMPLETE METABOLIC PANEL WITH GFR  Result Value Ref Range   Sodium 132 (L) 135 - 146 mmol/L   Potassium 5.7 (H) 3.5 - 5.3 mmol/L   Chloride 96 (L) 98 - 110 mmol/L   CO2 29 20 - 31 mmol/L   Glucose, Bld 119 (H) 65 - 99 mg/dL   BUN 19 7 - 25 mg/dL   Creat 1.09 0.70 - 1.25 mg/dL   Total Bilirubin 0.4 0.2 - 1.2 mg/dL   Alkaline Phosphatase 46 40 - 115 U/L   AST 16 10 - 35 U/L   ALT 19 9 - 46 U/L   Total Protein 6.5 6.1 - 8.1 g/dL   Albumin 4.4 3.6 - 5.1 g/dL   Calcium 9.6 8.6 - 10.3 mg/dL   GFR, Est African American 81 >=60 mL/min   GFR, Est Non African American 70 >=60 mL/min  Hemoglobin A1c  Result Value Ref Range   Hgb A1c MFr Bld 6.5 (H) <5.7  %   Mean Plasma Glucose 140 mg/dL   Diabetic Labs (most recent): Lab Results  Component Value Date   HGBA1C 6.5 (H) 02/17/2016   HGBA1C 7.1 09/30/2015   HGBA1C 6.9 (H) 06/27/2015     Assessment & Plan:   1. Uncontrolled type 2 diabetes mellitus without complication, with long-term current use of insulin (HCC) -No gross complications so far, however patient remains at a high risk for more acute and chronic complications of diabetes which include CAD, CVA, CKD, retinopathy, and neuropathy. These are all discussed in detail with the patient.  Patient came with improved and controlled fasting glucose profile, and  recent A1c  Improved to 6.5% , generally improving from 10.5 %.  Glucose logs and insulin administration records pertaining to this visit,  to be scanned into patient's records.  Recent labs reviewed.   - I have re-counseled the patient on diet management   by adopting a carbohydrate restricted / protein rich  Diet.  - Suggestion is made for patient to avoid simple carbohydrates   from their diet including Cakes , Desserts, Ice Cream,  Soda (  diet and regular) , Sweet Tea , Candies,  Chips, Cookies, Artificial Sweeteners,   and "Sugar-free" Products .  This will help patient to have stable blood glucose profile and potentially avoid unintended  Weight gain.  - Patient is advised to stick to a routine mealtimes to eat 3 meals  a day and avoid unnecessary snacks ( to snack only to correct hypoglycemia).  - The patient  has been  scheduled with Jearld Fenton, RDN, CDE for individualized DM education.  - I have approached patient with the following individualized plan to manage diabetes and patient agrees.  - He will  continue to need at least a basal insulin for  long term, currently Toujeo 15 units daily at bedtime , associated with monitoring BG at least at fasting.  -I will continue his metformin  500 mg by mouth twice a day. -he is allowed to increase his healthy carbs intake   to avoid unintended weight loss. - his c-peptide is critically low at 0.5, indicating pancreatic exhaustion.  - It is only a matter of time before he will require prandial insulin in addition to his basal insulin. - His GAD antibodies are negative, however does not rule out a possibility of LADA.  He is not a candidate for  incretin therapy.  - Patient specific target  for A1c; LDL, HDL, Triglycerides, and  Waist Circumference were discussed in detail.  2) BP/HTN:  controlled. Continue current medications including ACEI/ARB. 3) Lipids/HPL:  continue statins. 4)  Weight/Diet: CDE consult in progress, exercise, and carbohydrates information provided.  5) Chronic Care/Health Maintenance:  -Patient is on ACEI/ARB and Statin medications and encouraged to continue to follow up with Ophthalmology, Podiatrist at least yearly or according to recommendations, and advised to  stay away from smoking. I have recommended yearly flu vaccine and pneumonia vaccination at least every 5 years; moderate intensity exercise for up to 150 minutes weekly; and  sleep for at least 7 hours a day. - Since his last visit, he was diagnosed with essential tremors. I advised him to continue follow-up with neurology. - 25 minutes of time was spent on the care of this patient , 50% of which was applied for counseling on diabetes complications and their preventions.  - I advised patient to maintain close follow up with [name], MD for primary care needs.  Patient is asked to bring meter and  blood glucose logs during their next visit.   Follow up plan: -Return in about 4 months (around 06/23/2016) for follow up with pre-visit labs, meter, and logs.  Glade Duke, MD Phone: [phone]  Fax: [phone]   02/24/2016, 9:34 AM

## 2016-03-06 NOTE — H&P (Signed)
Patient ID:   [id number] Patient: John Reese  Date of Birth: 01/06/1951 Visit Type: Office Visit   Date: 01/23/2016 11:30 AM Provider: Marchia Meiers. Vertell Limber MD   This 65 year old male presents for Tremor.  History of Present Illness: 1.  Tremor    [place], 65 year old retired male visits to discuss deep brain stimulator for essential tremor.  Patient is referred by Dr. Carles Reese.   History: IDDM, essential tremor, trigeminal neuralgia right treated with gabapentin 300 mg twice a day by Dr. Jannifer Reese, left index fingertip amputation Surgical history: Right eardrum surgery 1988, right wisdom tooth two weeks ago  MRI on [place]        PAST MEDICAL/SURGICAL HISTORY   (Detailed)  Disease/disorder Onset Date Management Date Comments      [facility] 01/23/2016 -  Hearing loss    [facility] 01/23/2016 -  Hyperlipemia    [facility] 01/23/2016 -     PAST MEDICAL HISTORY, SURGICAL HISTORY, FAMILY HISTORY, SOCIAL HISTORY AND REVIEW OF SYSTEMS  01/23/2016, which I have signed.  Family History  (Detailed)   SOCIAL HISTORY  (Detailed) Tobacco use reviewed. Preferred language is Unknown.   Smoking status: Never smoker.  SMOKING STATUS Use Status Type Smoking Status Usage Per Day Years Used Total Pack Years  no/never  Never smoker             MEDICATIONS(added, continued or stopped this visit): Started Medication Directions Instruction Stopped   carbamazepine 200 mg tablet take 1 tablet by oral route  every 12 hours     clonazepam 0.5 mg tablet take 1 tablet by oral route  every day    01/23/2016 gabapentin 300 mg capsule take 1 capsule by oral route 3 times every day     metformin 500 mg tablet take 1 tablet by oral route 2 times every day with morning and evening meals     pravastatin 40 mg tablet take 1 tablet by oral route  every day     propanolol ER 20 ORAL      propanolol ER 20 ORAL      ramipril 2.5 mg capsule take 1 capsule by oral route  every day     Toujeo SoloStar 300 unit/mL (1.5  mL) subcutaneous insulin pen 15 untis daily       ALLERGIES: Ingredient Reaction Medication Name Comment  NO KNOWN ALLERGIES     No known allergies.    Vitals Date Temp F BP Pulse Ht In Wt Lb BMI BSA Pain Score  01/23/2016  140/80 76 73 157.4 20.77  0/10     PHYSICAL EXAM General Level of Distress: no acute distress Overall Appearance: normal  Head and Face  Right Left  Fundoscopic Exam:  normal normal    Cardiovascular Cardiac: regular rate and rhythm without murmur  Right Left  Carotid Pulses: normal normal  Respiratory Lungs: clear to auscultation  Neurological Orientation: normal Recent and Remote Memory: normal Attention Span and Concentration:   normal Language: normal Fund of Knowledge: normal  Right Left Sensation: normal normal Upper Extremity Coordination: normal normal  Lower Extremity Coordination: normal normal  Musculoskeletal Gait and Station: normal  Right Left Upper Extremity Muscle Strength: normal normal Lower Extremity Muscle Strength: normal normal Upper Extremity Muscle Tone:  tremor tremor Lower Extremity Muscle Tone: tremor tremor  Motor Strength Upper and lower extremity motor strength was tested in the clinically pertinent muscles.     Deep Tendon Reflexes  Right Left Biceps: normal normal Triceps: normal normal Brachiloradialis: normal  normal Patellar: normal normal Achilles: normal normal  Cranial Nerves II. Optic Nerve/Visual Fields: normal III. Oculomotor: normal IV. Trochlear: normal V. Trigeminal: normal VI. Abducens: normal VII. Facial: normal VIII. Acoustic/Vestibular: normal IX. Glossopharyngeal: normal X. Vagus: normal XI. Spinal Accessory: normal XII. Hypoglossal: normal  Motor and other Tests Lhermittes: negative Rhomberg: negative Pronator drift: absent     Right Left Hoffman's: normal normal Clonus: normal normal Babinski: normal normal   Additional Findings:  Significant essential  tremor in both arms (greater with postural activity and action), also pronounced at rest.    IMPRESSION The patient has significant essential tremor in both his arms greater than his legs. He has a lot of difficulty with daily activities, such as eating and dressing himself. He would like to proceed with surgery, so we will schedule a deep brain stimulator implantation for alleviation of his tremors.   Schedule DBS implantation. I discussed the details of surgery with the patient and his wife today.     MEDICATIONS PRESCRIBED TODAY    Rx Quantity Refills  GABAPENTIN 300 mg  0 0            Provider:  Marchia Meiers. Vertell Limber MD  01/23/2016 12:23 PM Dictation edited by: John Reese    CC Providers: John Reese [address] [place], [place] [address]              Electronically signed by Marchia Meiers. Vertell Limber MD on 01/23/2016 02:13 PM

## 2016-03-09 ENCOUNTER — Other Ambulatory Visit (HOSPITAL_COMMUNITY): Payer: Self-pay | Admitting: Pharmacy Technician

## 2016-03-12 ENCOUNTER — Ambulatory Visit (INDEPENDENT_AMBULATORY_CARE_PROVIDER_SITE_OTHER): Payer: PPO | Admitting: Neurology

## 2016-03-12 ENCOUNTER — Encounter: Payer: Self-pay | Admitting: Neurology

## 2016-03-12 VITALS — BP 128/62 | HR 62 | Ht 73.0 in | Wt 157.0 lb

## 2016-03-12 DIAGNOSIS — G500 Trigeminal neuralgia: Secondary | ICD-10-CM | POA: Diagnosis not present

## 2016-03-12 DIAGNOSIS — G250 Essential tremor: Secondary | ICD-10-CM

## 2016-03-12 NOTE — Patient Instructions (Addendum)
Decrease medication as follows:  1.  Klonopin - last dose the AM before surgery (don't take the bedtime dose) 2.  Propranolol- go to 40 mg - 1 tablet twice per day starting today (or tomorrow AM).  The day before surgery, hold this medication.  Keep an eye on your blood pressure this week 3.  Gabapentin - hold this the day prior to surgery 4.  Restart all immediately after surgery  You have an appt at our office on January 4 at 1:30 to turn on your device and will have an appt again at my office on January 16 at 1:30.  Do NOT take your klonopin that day and hold your propranolol that day as well  You can eat tomorrow before fiducial placement

## 2016-03-12 NOTE — Progress Notes (Signed)
Subjective:   John Reese was seen in consultation in the movement disorder clinic at the request of John Givens, NP.  His PCP is John Mustache, MD.  The evaluation is for tremor.  This patient is accompanied in the office by his spouse who supplements the history. The records that were made available to me were reviewed. Wife thinks that he has had tremor for as long as they have been married, for over 30 years.  His father and paternal GF had tremor.   He estimates that he started seeing Dr. Jannifer Reese 10 years ago but I only have records from the recent years.  He has been on klonopin for many years, 0.25mg  bid (never been on higher dosage) and is now on propranolol 80 mg bid as well for tremor.  Pt also has long hx of right trigeminal neuralgia and tried lyrica, 100mg  tid but it caused asterixis (pt states that he changed because of cost more than SE).  He is on tegretol but had hyponatremia with this in the past.  Is on gabapentin 300 mg bid.    Affected by caffeine:  No. (2 cups of coffee/day) Affected by alcohol: (doesn't drink EtOH) Affected by stress:  Yes.   Affected by fatigue:  No. Spills soup if on spoon:  Yes.   Spills glass of liquid if full:  Yes.   Affects ADL's (tying shoes, brushing teeth, etc):  Yes.   (shaves with electric now because of tremor; brushes teeth with both hands; doesn't wear many shoes with ties; wife has to give insulin shots)  12/15/15 update:  Pt f/u today.   He is accompanied by his wife who supplements the hx.   The records that were made available to me were reviewed.  He had neuropsych testing with Dr. Si Reese on 8/2 and f/u with her on 8/17.  There was no evidence of dementia and felt he would be a good DBS candidate.  Pt expresses desire to proceed with next step.  Expresses desire to do bilateral surgery due to severity of tremor.  He is R hand dominant.  03/12/16 update:  Pt f/u accompanied by his wife who supplements the history.  He is  scheduled for fiducial placement tomorrow.  Comes in today for pre-op video and any last minute questions.  He is on gabapentin 400 mg tid, tegretol - 200mg , 1 po tid for right sided TN.  It still is providing him pain on the right.  Also on klonopin 0.25 mg bid for tremor.  On propranolol 40 mg - 2 po bid.    Current/Previously tried tremor medications: klonopin, propranolol, lyrica (for TN), gabapentin (for TN)  Current medications that may exacerbate tremor:  John Reese  Outside reports reviewed: historical medical records, lab reports and office notes.  Allergies  Allergen Reactions  . No Known Allergies     Outpatient Encounter Prescriptions as of 03/12/2016  Medication Sig  . aspirin 81 MG tablet Take 81 mg by mouth daily.  . carbamazepine (TEGRETOL) 200 MG tablet TAKE 1 AND 1/2 TABLETS IN THE MORNING, 1 TABLET AT MIDDAY AND 1 AND 1/2 TABLETS IN THE EVENING (Patient taking differently: 1 tablet twice daily)  . CINNAMON PO Take 1,000 mg by mouth daily.  . clonazePAM (KLONOPIN) 0.25 MG disintegrating tablet DISSOLVE 1 TABLET TWICE A DAY  . fish oil-omega-3 fatty acids 1000 MG capsule Take 2 g by mouth daily.  Marland Kitchen gabapentin (NEURONTIN) 400 MG capsule Take 1 capsule (400 mg total)  by mouth 3 (three) times daily.  . metFORMIN (GLUCOPHAGE) 500 MG tablet Take 1 tablet (500 mg total) by mouth 2 (two) times daily with a meal.  . Multiple Vitamin (MULTIVITAMIN) tablet Take 1 tablet by mouth daily.  . pravastatin (PRAVACHOL) 40 MG tablet Take 40 mg by mouth daily.  . propranolol (INDERAL) 40 MG tablet TAKE 2 TABLETS (80 MG TOTAL) BY MOUTH 2 (TWO) TIMES DAILY.  . ramipril (ALTACE) 2.5 MG capsule Take 2.5 mg by mouth daily.  John [facility] SOLOSTAR 300 UNIT/ML SOPN Inject 15 Units as directed at bedtime.  Marland Kitchen ULTICARE MINI PEN NEEDLES 31G X 6 MM MISC USE AS DIRECTED AT BEDTIME   No facility-administered encounter medications on file as of 03/12/2016.     Past Medical History:  Diagnosis Date  . Auditory  disturbance    Decreased auditory acuity  . 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 Diabetes ([place])   . Dyslipidemia   . Tremor   . Trigeminal neuralgia    Right, V2 distribution    Past Surgical History:  Procedure Laterality Date  . FINGER AMPUTATION    . INNER EAR SURGERY Right    Ear drum repair    Social History   Social History  . Marital status: Married    Spouse name: John Reese  . Number of children: 2  . Years of education: HS   Occupational History  . Works in Theatre manager Other   Social History Main Topics  . Smoking status: Never Smoker  . Smokeless tobacco: Never Used  . Alcohol use No  . Drug use: No  . Sexual activity: Not on file   Other Topics Concern  . Not on file   Social History Narrative   Patient is right handed.   Patient drinks 2 cups coffee daily.    Family Status  Relation Status  . Father Deceased at age 35  . Sister Alive  . Brother Deceased  . Mother Alive  . Brother Alive  . Brother Alive  . Brother Alive  . Brother Alive  . Sister Alive    Review of Systems A complete 10 system ROS was obtained and was negative apart from what is mentioned.   Objective:   VITALS:   Vitals:   03/12/16 1358  BP: 128/62  Pulse: 62  Weight: 157 lb (71.2 kg)  Height: 6\' 1"  (1.854 m)   Gen:  Appears stated age and in NAD. HEENT:  Normocephalic, atraumatic. The mucous membranes are moist. The superficial temporal arteries are without ropiness or tenderness. Cardiovascular: Regular rate and rhythm. Lungs: Clear to auscultation bilaterally. Neck: There are no carotid bruits noted bilaterally.  NEUROLOGICAL:  Orientation:  The patient is alert and oriented x 3.   Cranial nerves: There is good facial symmetry.  Speech is fluent and clear. Soft palate rises symmetrically and there is no tongue deviation. Hearing is intact to conversational tone. Tone: Tone is good throughout. Sensation: Sensation is  intact to light touch Coordination:  The patient has no dysdiadichokinesia or dysmetria. Motor: Strength is 5/5 in the bilateral upper and lower extremities.  Shoulder shrug is equal bilaterally.  There is no pronator drift.  There are no fasciculations noted. Gait and Station: The patient is able to ambulate without difficulty.   MOVEMENT EXAM: Tremor:  There is a nonrhythmic resting tremor, with occasional myoclonic features.  Resting tremor is present in both upper extremities, but is independent of one another.  He has some tremor in the legs as well, but that is much less frequent.  He has tremor of both outstretched hands that is worse in the wing beating position and the right is much more significant than the left.    LABS:    Chemistry      Component Value Date/Time   NA 132 (L) 02/17/2016 0802   NA 138 10/26/2015 1026   K 5.7 (H) 02/17/2016 0802   CL 96 (L) 02/17/2016 0802   CO2 29 02/17/2016 0802   BUN 19 02/17/2016 0802   BUN 18 10/26/2015 1026   CREATININE 1.09 02/17/2016 0802      Component Value Date/Time   CALCIUM 9.6 02/17/2016 0802   ALKPHOS 46 02/17/2016 0802   AST 16 02/17/2016 0802   ALT 19 02/17/2016 0802   BILITOT 0.4 02/17/2016 0802   BILITOT <0.2 10/26/2015 1026     No results found for: VITAMINB12  No results found for: TSH      Assessment/Plan:   1.  Essential Tremor.  -I talked to the patient about the logistics associated with DBS therapy.  I talked to the patient about risks/benefits/side effects of DBS therapy.  We talked again about risks which included but were not limited to infection, paralysis, intraoperative seizure, death, stroke, bleeding around the electrode.  He still wants a bilateral sx and understands there is a risk of loss of balance with this.  We will start with the L side (he is R handed).  He prefers 2 IPG's.    -He was instructed to decrease medication as follows prior to surgery:   Klonopin - last dose the AM before surgery  (don't take the bedtime dose)   Propranolol- go to 40 mg - 1 tablet twice per day starting today (or tomorrow AM).  The day before surgery, hold this medication.     Gabapentin - hold this the day prior to surgery   restart all immediately after surgery  -pre-op DBS video done  2.  R trigeminal neuragia  -on gabapentin and tegretol  3.  Diabetic peripheral neuropathy  -talked about safety 4.  He has an appt at our office on January 4 at 1:30 to turn on device and will have an appt again at my office on January 16 at 1:30.  Do NOT take klonopin that day and hold propranolol that day as well  5.  F/u Dr. Jannifer Reese as previously scheduled.  Much greater than 50% of this visit was spent in counseling and coordinating care.  Total face to face time:  45 min

## 2016-03-13 ENCOUNTER — Ambulatory Visit (HOSPITAL_COMMUNITY)
Admission: RE | Admit: 2016-03-13 | Discharge: 2016-03-13 | Disposition: A | Discharge status: Home / Self Care | Payer: PPO | Source: Ambulatory Visit | Attending: Neurosurgery | Admitting: Neurosurgery

## 2016-03-13 ENCOUNTER — Encounter (HOSPITAL_COMMUNITY)
Admission: RE | Disposition: A | Discharge status: Home / Self Care | Payer: Self-pay | Source: Ambulatory Visit | Attending: Neurosurgery

## 2016-03-13 DIAGNOSIS — R259 Unspecified abnormal involuntary movements: Secondary | ICD-10-CM | POA: Diagnosis not present

## 2016-03-13 DIAGNOSIS — H9190 Unspecified hearing loss, unspecified ear: Secondary | ICD-10-CM | POA: Insufficient documentation

## 2016-03-13 DIAGNOSIS — E119 Type 2 diabetes mellitus without complications: Secondary | ICD-10-CM | POA: Diagnosis not present

## 2016-03-13 DIAGNOSIS — G500 Trigeminal neuralgia: Secondary | ICD-10-CM | POA: Insufficient documentation

## 2016-03-13 DIAGNOSIS — E785 Hyperlipidemia, unspecified: Secondary | ICD-10-CM | POA: Diagnosis not present

## 2016-03-13 DIAGNOSIS — Z794 Long term (current) use of insulin: Secondary | ICD-10-CM | POA: Insufficient documentation

## 2016-03-13 DIAGNOSIS — G250 Essential tremor: Secondary | ICD-10-CM | POA: Insufficient documentation

## 2016-03-13 DIAGNOSIS — Z7982 Long term (current) use of aspirin: Secondary | ICD-10-CM | POA: Insufficient documentation

## 2016-03-13 DIAGNOSIS — R51 Headache: Secondary | ICD-10-CM | POA: Diagnosis not present

## 2016-03-13 DIAGNOSIS — Z89022 Acquired absence of left finger(s): Secondary | ICD-10-CM | POA: Insufficient documentation

## 2016-03-13 SURGERY — MINOR PLACEMENT OF FIDUCIAL
Anesthesia: LOCAL

## 2016-03-13 MED ORDER — BACITRACIN ZINC 500 UNIT/GM EX OINT
TOPICAL_OINTMENT | CUTANEOUS | Status: AC
  Filled 2016-03-13: qty 28.35

## 2016-03-13 MED ORDER — CEPHALEXIN 250 MG PO CAPS: 250.0000 mg | ORAL_CAPSULE | Freq: Three times a day (TID) | ORAL | 0 refills | Status: DC

## 2016-03-13 MED ORDER — HYDROCODONE-ACETAMINOPHEN 5-325 MG PO TABS: 1.0000 | ORAL_TABLET | Freq: Four times a day (QID) | ORAL | 0 refills | Status: DC | PRN

## 2016-03-13 MED ORDER — LIDOCAINE-EPINEPHRINE 1 %-1:100000 IJ SOLN
INTRAMUSCULAR | Status: AC
  Filled 2016-03-13: qty 1

## 2016-03-13 SURGICAL SUPPLY — 13 items
BANDAGE ADH SHEER 1  50/CT (GAUZE/BANDAGES/DRESSINGS) ×10 IMPLANT
BLADE CLIPPER SURG (BLADE) ×2 IMPLANT
BLADE SURG 15 STRL LF DISP TIS (BLADE) ×1 IMPLANT
BLADE SURG 15 STRL SS (BLADE) ×2
DRAPE HALF SHEET 40X57 (DRAPES) ×2 IMPLANT
GLOVE BIO SURGEON STRL SZ8 (GLOVE) ×2 IMPLANT
GLOVE ECLIPSE 8.5 STRL (GLOVE) ×2 IMPLANT
NDL HYPO 25X1 1.5 SAFETY (NEEDLE) ×1 IMPLANT
NEEDLE HYPO 25X1 1.5 SAFETY (NEEDLE) ×2 IMPLANT
SPONGE GAUZE 4X4 12PLY STER LF (GAUZE/BANDAGES/DRESSINGS) ×2 IMPLANT
STAPLER SKIN PROX WIDE 3.9 (STAPLE) ×2 IMPLANT
SYR CONTROL 10ML LL (SYRINGE) ×2 IMPLANT
TOWEL OR 17X26 10 PK STRL BLUE (TOWEL DISPOSABLE) ×2 IMPLANT

## 2016-03-13 NOTE — Interval H&P Note (Signed)
History and Physical Interval Note:  03/13/2016 7:54 AM  John Reese  has presented today for surgery, with the diagnosis of * No pre-op diagnosis entered *  The various methods of treatment have been discussed with the patient and family. After consideration of risks, benefits and other options for treatment, the patient has consented to  Procedure(s): MINOR PLACEMENT OF FIDUCIAL (N/A) as a surgical intervention .  The patient's history has been reviewed, patient examined, no change in status, stable for surgery.  I have reviewed the patient's chart and labs.  Questions were answered to the patient's satisfaction.     Ellorie Kindall D

## 2016-03-13 NOTE — Op Note (Signed)
Indications:  Patient has essential tremor and presents for Star Fix Fiducial Placement for upcoming DBS VIM placement.    Procedure:  Patient was brought to the operating room.  His scalp had been shaved.  Areas of planned fiducial placement were marked, scalp was prepped with Duraprep.  Scalp was infiltrated with lidocaine with epinephrine.  Four fiducials were placed according to standard landmarks through stab incisions.  4-0 Nylon sutures were placed and sterile dressings were applied.  Patient tolerated procedure well.  He was taken to recovery. 

## 2016-03-13 NOTE — Discharge Summary (Signed)
Physician Discharge Summary Patient ID: John Reese MRN: [id number] DOB/AGE: 1950/09/16 65 y.o.  Admit date: 03/13/2016 Discharge date: 03/13/2016  Admission Diagnoses:Tremor  Discharge Diagnoses:  Active Problems:   * No active hospital problems. *   Discharged Condition: good  Hospital Course: Patient had starfix fiducial markers placed in preoperative holding and was then sent home after Head CT was obtained in preparation for DBS procedure  Consults: None  Significant Diagnostic Studies: None  Treatments: surgery: starfix fiducial markers placed  Discharge Exam: Blood pressure (!) 150/73, pulse 60, temperature 97.7 F (36.5 C), temperature source Oral, resp. rate 20, height 6\' 1"  (1.854 m), weight 71.2 kg (157 lb), SpO2 100 %. Neurologic: Alert and oriented X 3, normal strength and tone. Normal symmetric reflexes. Normal coordination and gait Wound:CDI  Disposition: Home    Medication List  STOP taking these medications  aspirin 81 MG tablet   TAKE these medications  carbamazepine 200 MG tablet Commonly known as:  TEGRETOL TAKE 1 AND 1/2 TABLETS IN THE MORNING, 1 TABLET AT MIDDAY AND 1 AND 1/2 TABLETS IN THE EVENING What changed:  See the new instructions.  cephALEXin 250 MG capsule Commonly known as:  KEFLEX Take 1 capsule (250 mg total) by mouth 3 (three) times daily.  CINNAMON PO Take 1,000 mg by mouth daily.  clonazePAM 0.25 MG disintegrating tablet Commonly known as:  KLONOPIN DISSOLVE 1 TABLET TWICE A DAY  fish oil-omega-3 fatty acids 1000 MG capsule Take 2 g by mouth daily.  gabapentin 400 MG capsule Commonly known as:  NEURONTIN Take 1 capsule (400 mg total) by mouth 3 (three) times daily.  HYDROcodone-acetaminophen 5-325 MG tablet Commonly known as:  NORCO/VICODIN Take 1 tablet by mouth every 6 (six) hours as needed for moderate pain.  metFORMIN 500 MG tablet Commonly known as:  GLUCOPHAGE Take 1 tablet (500 mg total) by mouth 2  (two) times daily with a meal.  multivitamin tablet Take 1 tablet by mouth daily.  pravastatin 40 MG tablet Commonly known as:  PRAVACHOL Take 40 mg by mouth daily.  propranolol 40 MG tablet Commonly known as:  INDERAL TAKE 2 TABLETS (80 MG TOTAL) BY MOUTH 2 (TWO) TIMES DAILY.  ramipril 2.5 MG capsule Commonly known as:  ALTACE Take 2.5 mg by mouth daily.  TOUJEO SOLOSTAR 300 UNIT/ML Sopn Generic drug:  Insulin Glargine Inject 15 Units as directed at bedtime.  ULTICARE MINI PEN NEEDLES 31G X 6 MM Misc Generic drug:  Insulin Pen Needle USE AS DIRECTED AT BEDTIME      Signed: Peggyann Shoals, MD 03/13/2016, 8:28 AMremor

## 2016-03-13 NOTE — Progress Notes (Signed)
D.c teaching given per Verdis Prime, RN

## 2016-03-14 ENCOUNTER — Other Ambulatory Visit: Payer: Self-pay | Admitting: *Deleted

## 2016-03-14 MED ORDER — GABAPENTIN 400 MG PO CAPS: 400.0000 mg | ORAL_CAPSULE | Freq: Three times a day (TID) | ORAL | 1 refills | Status: DC

## 2016-03-16 ENCOUNTER — Other Ambulatory Visit (HOSPITAL_COMMUNITY): Payer: Self-pay

## 2016-03-16 ENCOUNTER — Other Ambulatory Visit (HOSPITAL_COMMUNITY): Payer: Self-pay | Admitting: *Deleted

## 2016-03-19 ENCOUNTER — Encounter (HOSPITAL_COMMUNITY): Payer: Self-pay

## 2016-03-19 ENCOUNTER — Encounter (HOSPITAL_COMMUNITY)
Admission: RE | Admit: 2016-03-19 | Discharge: 2016-03-19 | Disposition: A | Discharge status: Home / Self Care | Payer: PPO | Source: Ambulatory Visit | Attending: Neurosurgery | Admitting: Neurosurgery

## 2016-03-19 DIAGNOSIS — R269 Unspecified abnormalities of gait and mobility: Secondary | ICD-10-CM | POA: Diagnosis not present

## 2016-03-19 DIAGNOSIS — E119 Type 2 diabetes mellitus without complications: Secondary | ICD-10-CM | POA: Diagnosis not present

## 2016-03-19 DIAGNOSIS — Z01812 Encounter for preprocedural laboratory examination: Secondary | ICD-10-CM | POA: Insufficient documentation

## 2016-03-19 DIAGNOSIS — G9389 Other specified disorders of brain: Secondary | ICD-10-CM | POA: Diagnosis not present

## 2016-03-19 DIAGNOSIS — I10 Essential (primary) hypertension: Secondary | ICD-10-CM | POA: Diagnosis not present

## 2016-03-19 DIAGNOSIS — G252 Other specified forms of tremor: Secondary | ICD-10-CM | POA: Insufficient documentation

## 2016-03-19 DIAGNOSIS — E785 Hyperlipidemia, unspecified: Secondary | ICD-10-CM | POA: Diagnosis not present

## 2016-03-19 DIAGNOSIS — G250 Essential tremor: Secondary | ICD-10-CM | POA: Diagnosis not present

## 2016-03-19 DIAGNOSIS — Z969 Presence of functional implant, unspecified: Secondary | ICD-10-CM | POA: Diagnosis not present

## 2016-03-19 LAB — TYPE AND SCREEN
ABO/RH(D): A POS
Antibody Screen: NEGATIVE

## 2016-03-19 LAB — CBC
HEMATOCRIT: 37.8 % — AB (ref 39.0–52.0)
Hemoglobin: 12.6 g/dL — ABNORMAL LOW (ref 13.0–17.0)
MCH: 29.1 pg (ref 26.0–34.0)
MCHC: 33.3 g/dL (ref 30.0–36.0)
MCV: 87.3 fL (ref 78.0–100.0)
PLATELETS: 153 10*3/uL (ref 150–400)
RBC: 4.33 MIL/uL (ref 4.22–5.81)
RDW: 12.3 % (ref 11.5–15.5)
WBC: 4.6 10*3/uL (ref 4.0–10.5)

## 2016-03-19 LAB — BASIC METABOLIC PANEL
Anion gap: 9 (ref 5–15)
BUN: 19 mg/dL (ref 6–20)
CHLORIDE: 101 mmol/L (ref 101–111)
CO2: 24 mmol/L (ref 22–32)
CREATININE: 0.99 mg/dL (ref 0.61–1.24)
Calcium: 9.4 mg/dL (ref 8.9–10.3)
Glucose, Bld: 170 mg/dL — ABNORMAL HIGH (ref 65–99)
POTASSIUM: 4.5 mmol/L (ref 3.5–5.1)
SODIUM: 134 mmol/L — AB (ref 135–145)

## 2016-03-19 LAB — GLUCOSE, CAPILLARY: Glucose-Capillary: 193 mg/dL — ABNORMAL HIGH (ref 65–99)

## 2016-03-19 LAB — ABO/RH: ABO/RH(D): A POS

## 2016-03-19 NOTE — Pre-Procedure Instructions (Addendum)
John Reese  03/19/2016      [id number] - [place], [place] [address] Phone: [phone] Fax: [phone]  [facility] [name], [name] [address] [place] [address] Phone: [phone] Fax: [phone]    Your procedure is scheduled on November 30th and December 8th, 2017 at 7:30am.  Report to John Re[facility] at [facility].  Call this number if you have problems the morning of surgery:  [phone]   Remember:  Do not eat food or drink liquids after midnight.  Take these medicines the morning of surgery with A SIP OF WATER Tegretol, Clonazepam, Pain Pill, Inderal, Toujeo 7 units    How to Manage Your Diabetes Before and After Surgery  Why is it important to control my blood sugar before and after surgery? . Improving blood sugar levels before and after surgery helps healing and can limit problems. . A way of improving blood sugar control is eating a healthy diet by: o  Eating less sugar and carbohydrates o  Increasing activity/exercise o  Talking with your doctor about reaching your blood sugar goals . High blood sugars (greater than 180 mg/dL) can raise your risk of infections and slow your recovery, so you will need to focus on controlling your diabetes during the weeks before surgery. . Make sure that the doctor who takes care of your diabetes knows about your planned surgery including the date and location.  How do I manage my blood sugar before surgery? . Check your blood sugar at least 4 times a day, starting 2 days before surgery, to make sure that the level is not too high or low. o Check your blood sugar the morning of your surgery when you wake up and every 2 hours until you get to the Short Stay unit. . If your blood sugar is less than 70 mg/dL, you will need to treat for low blood sugar: o Do not take insulin. o Treat a  low blood sugar (less than 70 mg/dL) with  cup of clear juice (cranberry or apple), 4 glucose tablets, OR glucose gel. o Recheck blood sugar in 15 minutes after treatment (to make sure it is greater than 70 mg/dL). If your blood sugar is not greater than 70 mg/dL on recheck, call [phone] for further instructions. . Report your blood sugar to the short stay nurse when you get to Short Stay.  . If you are admitted to the hospital after surgery: o Your blood sugar will be checked by the staff and you will probably be given insulin after surgery (instead of oral diabetes medicines) to make sure you have good blood sugar levels. o The goal for blood sugar control after surgery is 80-180 mg/dL.              WHAT DO I DO ABOUT MY DIABETES MEDICATION?   Marland Kitchen Do not take oral diabetes medicines (pills) the morning of surgery. Metformin (Glucophage)  . THE NIGHT BEFORE SURGERY, take _____7______ units of __Toujeo________insulin.       . The day of surgery, do not take other diabetes injectables, including Byetta (exenatide), Bydureon (exenatide ER), Victoza (liraglutide), or Trulicity (dulaglutide).  . If your CBG is greater than 220 mg/dL, you may take  of your sliding scale (correction) dose of insulin.  Other Instructions:          Patient Signature:  Date:  Nurse Signature:  Date:   Reviewed and Endorsed by John [facility] Patient Education Committee, August 2015  Do not wear jewelry, make-up or nail polish.  Do not wear lotions, powders, cologne, or deoderant.  Men may shave face and neck.  Do not bring valuables to the hospital.  [facility] is not responsible for any belongings or valuables.  Contacts, dentures or bridgework may not be worn into surgery.  Leave your suitcase in the car.  After surgery it may be brought to your room.  For patients admitted to the hospital, discharge time will be determined by your treatment team.  Patients discharged the day of  surgery will not be allowed to drive home.   Name and phone number of your driver:   Special instructions:   [facility]- Preparing For Surgery  Before surgery, you can play an important role. Because skin is not sterile, your skin needs to be as free of germs as possible. You can reduce the number of germs on your skin by washing with CHG (chlorahexidine gluconate) Soap before surgery.  CHG is an antiseptic cleaner which kills germs and bonds with the skin to continue killing germs even after washing.  Please do not use if you have an allergy to CHG or antibacterial soaps. If your skin becomes reddened/irritated stop using the CHG.  Do not shave (including legs and underarms) for at least 48 hours prior to first CHG shower. It is OK to shave your face.  Please follow these instructions carefully.   1. Shower the NIGHT BEFORE SURGERY and the MORNING OF SURGERY with CHG.   2. If you chose to wash your hair, wash your hair first as usual with your normal shampoo.  3. After you shampoo, rinse your hair and body thoroughly to remove the shampoo.  4. Use CHG as you would any other liquid soap. You can apply CHG directly to the skin and wash gently with a scrungie or a clean washcloth.   5. Apply the CHG Soap to your body ONLY FROM THE NECK DOWN.  Do not use on open wounds or open sores. Avoid contact with your eyes, ears, mouth and genitals (private parts). Wash genitals (private parts) with your normal soap.  6. Wash thoroughly, paying special attention to the area where your surgery will be performed.  7. Thoroughly rinse your body with warm water from the neck down.  8. DO NOT shower/wash with your normal soap after using and rinsing off the CHG Soap.  9. Pat yourself dry with a CLEAN TOWEL.   10. Wear CLEAN PAJAMAS   11. Place CLEAN SHEETS on your bed the night of your first shower and DO NOT SLEEP WITH PETS.    Day of Surgery: Do not apply any deodorants/lotions. Please wear  clean clothes to the hospital/surgery [facility].      Please read over the following fact sheets that you were given. Coughing and Deep Breathing

## 2016-03-19 NOTE — Progress Notes (Signed)
PCP Dr. Edrick Oh Cardiologist Dr. Marlou Porch, stress test in 2015 [facility]), denies having cardiac cath or echocardiogram. A1C 02/17/16 was 6.5. Reports fasting blood sugars run in the 130s.  Patient was instructed by Dr. Carles Collet to Take last dose of Klonopin the AM before surgery, do not take bedtime dose; Propranolol 40mg  take the day before surgery. Gabapentin hold the day prior to surgery. Restart all meds immediately after surgery.

## 2016-03-21 ENCOUNTER — Other Ambulatory Visit: Payer: Self-pay | Admitting: Neurology

## 2016-03-22 ENCOUNTER — Encounter (HOSPITAL_COMMUNITY): Payer: Self-pay | Admitting: *Deleted

## 2016-03-22 ENCOUNTER — Inpatient Hospital Stay (HOSPITAL_COMMUNITY): Payer: PPO

## 2016-03-22 ENCOUNTER — Inpatient Hospital Stay (HOSPITAL_COMMUNITY)
Admission: RE | Admit: 2016-03-22 | Discharge: 2016-03-23 | DRG: 027 | Disposition: A | Discharge status: Home / Self Care | Payer: PPO | Source: Ambulatory Visit | Attending: Neurosurgery | Admitting: Neurosurgery

## 2016-03-22 ENCOUNTER — Other Ambulatory Visit (HOSPITAL_COMMUNITY): Payer: PPO

## 2016-03-22 ENCOUNTER — Inpatient Hospital Stay (HOSPITAL_COMMUNITY): Payer: PPO | Admitting: Critical Care Medicine

## 2016-03-22 ENCOUNTER — Encounter (HOSPITAL_COMMUNITY)
Admission: RE | Disposition: A | Discharge status: Home / Self Care | Payer: Self-pay | Source: Ambulatory Visit | Attending: Neurosurgery

## 2016-03-22 ENCOUNTER — Other Ambulatory Visit: Payer: Self-pay | Admitting: Neurology

## 2016-03-22 DIAGNOSIS — Z969 Presence of functional implant, unspecified: Secondary | ICD-10-CM | POA: Diagnosis not present

## 2016-03-22 DIAGNOSIS — R251 Tremor, unspecified: Secondary | ICD-10-CM

## 2016-03-22 DIAGNOSIS — G250 Essential tremor: Principal | ICD-10-CM | POA: Diagnosis present

## 2016-03-22 DIAGNOSIS — E785 Hyperlipidemia, unspecified: Secondary | ICD-10-CM | POA: Diagnosis not present

## 2016-03-22 DIAGNOSIS — G9389 Other specified disorders of brain: Secondary | ICD-10-CM | POA: Diagnosis not present

## 2016-03-22 DIAGNOSIS — E119 Type 2 diabetes mellitus without complications: Secondary | ICD-10-CM | POA: Diagnosis not present

## 2016-03-22 DIAGNOSIS — Z9889 Other specified postprocedural states: Secondary | ICD-10-CM

## 2016-03-22 DIAGNOSIS — R29898 Other symptoms and signs involving the musculoskeletal system: Secondary | ICD-10-CM

## 2016-03-22 DIAGNOSIS — I10 Essential (primary) hypertension: Secondary | ICD-10-CM | POA: Diagnosis not present

## 2016-03-22 DIAGNOSIS — R269 Unspecified abnormalities of gait and mobility: Secondary | ICD-10-CM | POA: Diagnosis not present

## 2016-03-22 HISTORY — PX: SUBTHALAMIC STIMULATOR INSERTION: SHX5375

## 2016-03-22 LAB — GLUCOSE, CAPILLARY
GLUCOSE-CAPILLARY: 117 mg/dL — AB (ref 65–99)
Glucose-Capillary: 111 mg/dL — ABNORMAL HIGH (ref 65–99)
Glucose-Capillary: 173 mg/dL — ABNORMAL HIGH (ref 65–99)

## 2016-03-22 SURGERY — SUBTHALAMIC STIMULATOR INSERTION
Anesthesia: Monitor Anesthesia Care | Site: Head | Laterality: Bilateral

## 2016-03-22 MED ORDER — BISACODYL 10 MG RE SUPP: 10.0000 mg | Freq: Every day | RECTAL | Status: DC | PRN

## 2016-03-22 MED ORDER — FENTANYL CITRATE (PF) 100 MCG/2ML IJ SOLN
INTRAMUSCULAR | Status: AC
  Administered 2016-03-22: 50 ug via INTRAVENOUS
  Filled 2016-03-22: qty 2

## 2016-03-22 MED ORDER — LIDOCAINE-EPINEPHRINE (PF) 2 %-1:200000 IJ SOLN
INTRAMUSCULAR | Status: AC
  Filled 2016-03-22: qty 20

## 2016-03-22 MED ORDER — MIDAZOLAM HCL 2 MG/2ML IJ SOLN
INTRAMUSCULAR | Status: AC
  Filled 2016-03-22: qty 2

## 2016-03-22 MED ORDER — ROCURONIUM BROMIDE 10 MG/ML (PF) SYRINGE
PREFILLED_SYRINGE | INTRAVENOUS | Status: AC
  Filled 2016-03-22: qty 10

## 2016-03-22 MED ORDER — LIDOCAINE 2% (20 MG/ML) 5 ML SYRINGE
INTRAMUSCULAR | Status: AC
  Filled 2016-03-22: qty 5

## 2016-03-22 MED ORDER — SODIUM CHLORIDE 0.9% FLUSH
3.0000 mL | Freq: Two times a day (BID) | INTRAVENOUS | Status: DC
  Administered 2016-03-22: 3 mL via INTRAVENOUS

## 2016-03-22 MED ORDER — THROMBIN 5000 UNITS EX SOLR
CUTANEOUS | Status: AC
  Filled 2016-03-22: qty 5000

## 2016-03-22 MED ORDER — ONDANSETRON HCL 4 MG/2ML IJ SOLN
INTRAMUSCULAR | Status: AC
  Administered 2016-03-22: 4 mg via INTRAVENOUS
  Filled 2016-03-22: qty 2

## 2016-03-22 MED ORDER — METFORMIN HCL 500 MG PO TABS
500.0000 mg | ORAL_TABLET | Freq: Two times a day (BID) | ORAL | Status: DC
  Administered 2016-03-22 – 2016-03-23 (×2): 500 mg via ORAL
  Filled 2016-03-22 (×2): qty 1

## 2016-03-22 MED ORDER — KCL IN DEXTROSE-NACL 20-5-0.45 MEQ/L-%-% IV SOLN: INTRAVENOUS | Status: DC

## 2016-03-22 MED ORDER — HYDROCODONE-ACETAMINOPHEN 5-325 MG PO TABS: 1.0000 | ORAL_TABLET | Freq: Four times a day (QID) | ORAL | Status: DC | PRN

## 2016-03-22 MED ORDER — LIDOCAINE-EPINEPHRINE (PF) 2 %-1:200000 IJ SOLN
INTRAMUSCULAR | Status: AC
  Filled 2016-03-22: qty 40

## 2016-03-22 MED ORDER — CHLORHEXIDINE GLUCONATE CLOTH 2 % EX PADS: 6.0000 | MEDICATED_PAD | Freq: Once | CUTANEOUS | Status: DC

## 2016-03-22 MED ORDER — BUPIVACAINE HCL 0.5 % IJ SOLN
INTRAMUSCULAR | Status: DC | PRN
  Administered 2016-03-22: 14.5 mL

## 2016-03-22 MED ORDER — SODIUM BICARBONATE 4 % IV SOLN
INTRAVENOUS | Status: AC
  Filled 2016-03-22: qty 5

## 2016-03-22 MED ORDER — PRAVASTATIN SODIUM 40 MG PO TABS
40.0000 mg | ORAL_TABLET | Freq: Every day | ORAL | Status: DC
  Administered 2016-03-22: 40 mg via ORAL
  Filled 2016-03-22: qty 1

## 2016-03-22 MED ORDER — OXYCODONE-ACETAMINOPHEN 5-325 MG PO TABS: 1.0000 | ORAL_TABLET | ORAL | Status: DC | PRN

## 2016-03-22 MED ORDER — PROPRANOLOL HCL 40 MG PO TABS
40.0000 mg | ORAL_TABLET | Freq: Two times a day (BID) | ORAL | Status: DC
  Administered 2016-03-22 – 2016-03-23 (×2): 40 mg via ORAL
  Filled 2016-03-22 (×2): qty 1

## 2016-03-22 MED ORDER — ACETAMINOPHEN 650 MG RE SUPP: 650.0000 mg | RECTAL | Status: DC | PRN

## 2016-03-22 MED ORDER — BACITRACIN ZINC 500 UNIT/GM EX OINT
TOPICAL_OINTMENT | CUTANEOUS | Status: AC
  Filled 2016-03-22: qty 28.35

## 2016-03-22 MED ORDER — THROMBIN 5000 UNITS EX SOLR
CUTANEOUS | Status: DC | PRN
  Administered 2016-03-22 (×2): 5000 [IU] via TOPICAL

## 2016-03-22 MED ORDER — SODIUM CHLORIDE 0.9% FLUSH: 3.0000 mL | INTRAVENOUS | Status: DC | PRN

## 2016-03-22 MED ORDER — MENTHOL 3 MG MT LOZG: 1.0000 | LOZENGE | OROMUCOSAL | Status: DC | PRN

## 2016-03-22 MED ORDER — HYDROMORPHONE HCL 1 MG/ML IJ SOLN
INTRAMUSCULAR | Status: AC
  Administered 2016-03-22: 0.5 mg via INTRAVENOUS
  Filled 2016-03-22: qty 0.5

## 2016-03-22 MED ORDER — ALUM & MAG HYDROXIDE-SIMETH 200-200-20 MG/5ML PO SUSP: 30.0000 mL | Freq: Four times a day (QID) | ORAL | Status: DC | PRN

## 2016-03-22 MED ORDER — PHENOL 1.4 % MT LIQD
1.0000 | OROMUCOSAL | Status: DC | PRN
Start: 2016-03-22 — End: 2016-03-23

## 2016-03-22 MED ORDER — LIDOCAINE-EPINEPHRINE (PF) 2 %-1:200000 IJ SOLN
INTRAMUSCULAR | Status: DC | PRN
  Administered 2016-03-22: 14.5 mL

## 2016-03-22 MED ORDER — BUPIVACAINE HCL (PF) 0.5 % IJ SOLN
INTRAMUSCULAR | Status: AC
  Filled 2016-03-22: qty 60

## 2016-03-22 MED ORDER — CARBAMAZEPINE 200 MG PO TABS
200.0000 mg | ORAL_TABLET | Freq: Two times a day (BID) | ORAL | Status: DC
  Administered 2016-03-22 – 2016-03-23 (×2): 200 mg via ORAL
  Filled 2016-03-22 (×2): qty 1

## 2016-03-22 MED ORDER — PANTOPRAZOLE SODIUM 40 MG IV SOLR
40.0000 mg | Freq: Every day | INTRAVENOUS | Status: DC
  Administered 2016-03-22: 40 mg via INTRAVENOUS
  Filled 2016-03-22: qty 40

## 2016-03-22 MED ORDER — SODIUM BICARBONATE 4 % IV SOLN
INTRAVENOUS | Status: DC | PRN
  Administered 2016-03-22: 3 mL via INTRAVENOUS

## 2016-03-22 MED ORDER — 0.9 % SODIUM CHLORIDE (POUR BTL) OPTIME
TOPICAL | Status: DC | PRN
  Administered 2016-03-22: 1000 mL

## 2016-03-22 MED ORDER — FENTANYL CITRATE (PF) 100 MCG/2ML IJ SOLN
INTRAMUSCULAR | Status: DC | PRN
  Administered 2016-03-22: 50 ug via INTRAVENOUS
  Administered 2016-03-22 (×2): 25 ug via INTRAVENOUS

## 2016-03-22 MED ORDER — METHOCARBAMOL 500 MG PO TABS: 500.0000 mg | ORAL_TABLET | Freq: Four times a day (QID) | ORAL | Status: DC | PRN

## 2016-03-22 MED ORDER — DEXMEDETOMIDINE HCL IN NACL 200 MCG/50ML IV SOLN
INTRAVENOUS | Status: DC | PRN
  Administered 2016-03-22: .1 ug/kg/h via INTRAVENOUS

## 2016-03-22 MED ORDER — DOCUSATE SODIUM 100 MG PO CAPS
100.0000 mg | ORAL_CAPSULE | Freq: Two times a day (BID) | ORAL | Status: DC
  Administered 2016-03-22 – 2016-03-23 (×2): 100 mg via ORAL
  Filled 2016-03-22 (×2): qty 1

## 2016-03-22 MED ORDER — SODIUM CHLORIDE 0.9 % IV SOLN: 250.0000 mL | INTRAVENOUS | Status: DC

## 2016-03-22 MED ORDER — CEFAZOLIN SODIUM-DEXTROSE 2-4 GM/100ML-% IV SOLN
2.0000 g | INTRAVENOUS | Status: AC
  Administered 2016-03-22: 2 g via INTRAVENOUS
  Filled 2016-03-22: qty 100

## 2016-03-22 MED ORDER — CEFAZOLIN IN D5W 1 GM/50ML IV SOLN
1.0000 g | Freq: Three times a day (TID) | INTRAVENOUS | Status: AC
  Administered 2016-03-22 – 2016-03-23 (×2): 1 g via INTRAVENOUS
  Filled 2016-03-22 (×2): qty 50

## 2016-03-22 MED ORDER — FLEET ENEMA 7-19 GM/118ML RE ENEM: 1.0000 | ENEMA | Freq: Once | RECTAL | Status: DC | PRN

## 2016-03-22 MED ORDER — THROMBIN 5000 UNITS EX SOLR
CUTANEOUS | Status: DC | PRN
  Administered 2016-03-22: 09:00:00 via TOPICAL

## 2016-03-22 MED ORDER — HYDROCODONE-ACETAMINOPHEN 5-325 MG PO TABS: 1.0000 | ORAL_TABLET | ORAL | Status: DC | PRN

## 2016-03-22 MED ORDER — CINNAMON 500 MG PO CAPS: 1000.0000 mg | ORAL_CAPSULE | Freq: Every day | ORAL | Status: DC

## 2016-03-22 MED ORDER — PROPOFOL 10 MG/ML IV BOLUS
INTRAVENOUS | Status: AC
  Filled 2016-03-22: qty 20

## 2016-03-22 MED ORDER — ACETAMINOPHEN 325 MG PO TABS
650.0000 mg | ORAL_TABLET | ORAL | Status: DC | PRN
Start: 2016-03-22 — End: 2016-03-23

## 2016-03-22 MED ORDER — FENTANYL CITRATE (PF) 100 MCG/2ML IJ SOLN
25.0000 ug | INTRAMUSCULAR | Status: DC | PRN
  Administered 2016-03-22: 50 ug via INTRAVENOUS

## 2016-03-22 MED ORDER — INSULIN GLARGINE 100 UNIT/ML ~~LOC~~ SOLN
15.0000 [IU] | Freq: Every day | SUBCUTANEOUS | Status: DC
  Administered 2016-03-22: 15 [IU] via SUBCUTANEOUS
  Filled 2016-03-22 (×2): qty 0.15

## 2016-03-22 MED ORDER — MIDAZOLAM HCL 5 MG/5ML IJ SOLN
INTRAMUSCULAR | Status: DC | PRN
  Administered 2016-03-22 (×2): 1 mg via INTRAVENOUS

## 2016-03-22 MED ORDER — CLONAZEPAM 0.125 MG PO TBDP: 0.2500 mg | ORAL_TABLET | Freq: Two times a day (BID) | ORAL | Status: DC | PRN

## 2016-03-22 MED ORDER — ONDANSETRON HCL 4 MG/2ML IJ SOLN: 4.0000 mg | INTRAMUSCULAR | Status: DC | PRN

## 2016-03-22 MED ORDER — HYDROMORPHONE HCL 1 MG/ML IJ SOLN
0.5000 mg | INTRAMUSCULAR | Status: DC | PRN
  Administered 2016-03-22: 0.5 mg via INTRAVENOUS

## 2016-03-22 MED ORDER — RAMIPRIL 2.5 MG PO CAPS
2.5000 mg | ORAL_CAPSULE | Freq: Every day | ORAL | Status: DC
  Administered 2016-03-22 – 2016-03-23 (×2): 2.5 mg via ORAL
  Filled 2016-03-22 (×2): qty 1

## 2016-03-22 MED ORDER — GABAPENTIN 400 MG PO CAPS
400.0000 mg | ORAL_CAPSULE | Freq: Three times a day (TID) | ORAL | Status: DC
  Administered 2016-03-22 – 2016-03-23 (×3): 400 mg via ORAL
  Filled 2016-03-22 (×3): qty 1

## 2016-03-22 MED ORDER — ONDANSETRON HCL 4 MG/2ML IJ SOLN
4.0000 mg | Freq: Once | INTRAMUSCULAR | Status: AC | PRN
  Administered 2016-03-22: 4 mg via INTRAVENOUS

## 2016-03-22 MED ORDER — BACITRACIN ZINC 500 UNIT/GM EX OINT
TOPICAL_OINTMENT | CUTANEOUS | Status: DC | PRN
  Administered 2016-03-22: 1 via TOPICAL

## 2016-03-22 MED ORDER — HEMOSTATIC AGENTS (NO CHARGE) OPTIME
TOPICAL | Status: DC | PRN
  Administered 2016-03-22 (×2): 1 via TOPICAL

## 2016-03-22 MED ORDER — SUCCINYLCHOLINE CHLORIDE 200 MG/10ML IV SOSY
PREFILLED_SYRINGE | INTRAVENOUS | Status: AC
  Filled 2016-03-22: qty 10

## 2016-03-22 MED ORDER — SENNOSIDES-DOCUSATE SODIUM 8.6-50 MG PO TABS: 1.0000 | ORAL_TABLET | Freq: Every evening | ORAL | Status: DC | PRN

## 2016-03-22 MED ORDER — SODIUM CHLORIDE 0.9 % IV SOLN
INTRAVENOUS | Status: DC | PRN
  Administered 2016-03-22: 07:00:00 via INTRAVENOUS

## 2016-03-22 MED ORDER — METHOCARBAMOL 1000 MG/10ML IJ SOLN
500.0000 mg | Freq: Four times a day (QID) | INTRAVENOUS | Status: DC | PRN
  Filled 2016-03-22: qty 5

## 2016-03-22 MED ORDER — FENTANYL CITRATE (PF) 100 MCG/2ML IJ SOLN
INTRAMUSCULAR | Status: AC
Start: 2016-03-22 — End: 2016-03-22
  Filled 2016-03-22: qty 2

## 2016-03-22 MED ORDER — ADULT MULTIVITAMIN W/MINERALS CH
1.0000 | ORAL_TABLET | Freq: Every day | ORAL | Status: DC
  Administered 2016-03-22 – 2016-03-23 (×2): 1 via ORAL
  Filled 2016-03-22 (×3): qty 1

## 2016-03-22 SURGICAL SUPPLY — 83 items
APL SRG 60D 8 XTD TIP BNDBL (TIP) ×2
BANDAGE ADH SHEER 1  50/CT (GAUZE/BANDAGES/DRESSINGS) ×4 IMPLANT
BIT DRILL NEURO 2X3.1 SFT TUCH (MISCELLANEOUS) IMPLANT
BLADE CLIPPER SURG (BLADE) ×2 IMPLANT
BLADE SURG 11 STRL SS (BLADE) ×4 IMPLANT
BNDG GAUZE ELAST 4 BULKY (GAUZE/BANDAGES/DRESSINGS) ×2 IMPLANT
BOOT SUTURE AID YELLOW STND (SUTURE) ×2 IMPLANT
CANISTER SUCT 3000ML PPV (MISCELLANEOUS) ×3 IMPLANT
CARTRIDGE OIL MAESTRO DRILL (MISCELLANEOUS) ×1 IMPLANT
CLIP RANEY DISP (INSTRUMENTS) ×2 IMPLANT
CONT SPEC 4OZ CLIKSEAL STRL BL (MISCELLANEOUS) ×2 IMPLANT
DECANTER SPIKE VIAL GLASS SM (MISCELLANEOUS) ×13 IMPLANT
DIFFUSER DRILL AIR PNEUMATIC (MISCELLANEOUS) ×3 IMPLANT
DRAPE POUCH INSTRU U-SHP 10X18 (DRAPES) ×3 IMPLANT
DRAPE STERI IOBAN 125X83 (DRAPES) IMPLANT
DRILL NEURO 2X3.1 SOFT TOUCH (MISCELLANEOUS)
DRSG OPSITE 4X5.5 SM (GAUZE/BANDAGES/DRESSINGS) ×8 IMPLANT
DRSG TELFA 3X8 NADH (GAUZE/BANDAGES/DRESSINGS) ×3 IMPLANT
DURAPREP 26ML APPLICATOR (WOUND CARE) ×1 IMPLANT
DURAPREP 6ML APPLICATOR 50/CS (WOUND CARE) ×2 IMPLANT
DURASEAL APPLICATOR TIP (TIP) ×4 IMPLANT
DURASEAL SPINE SEALANT 3ML (MISCELLANEOUS) ×4 IMPLANT
ELECT CAUTERY BLADE 6.4 (BLADE) ×1 IMPLANT
GAUZE SPONGE 4X4 12PLY STRL (GAUZE/BANDAGES/DRESSINGS) IMPLANT
GAUZE SPONGE 4X4 16PLY XRAY LF (GAUZE/BANDAGES/DRESSINGS) IMPLANT
GLOVE BIO SURGEON STRL SZ8 (GLOVE) ×5 IMPLANT
GLOVE BIOGEL PI IND STRL 6.5 (GLOVE) IMPLANT
GLOVE BIOGEL PI IND STRL 7.0 (GLOVE) IMPLANT
GLOVE BIOGEL PI IND STRL 7.5 (GLOVE) IMPLANT
GLOVE BIOGEL PI IND STRL 8 (GLOVE) ×2 IMPLANT
GLOVE BIOGEL PI IND STRL 8.5 (GLOVE) ×2 IMPLANT
GLOVE BIOGEL PI INDICATOR 6.5 (GLOVE) ×2
GLOVE BIOGEL PI INDICATOR 7.0 (GLOVE) ×2
GLOVE BIOGEL PI INDICATOR 7.5 (GLOVE) ×2
GLOVE BIOGEL PI INDICATOR 8 (GLOVE) ×2
GLOVE BIOGEL PI INDICATOR 8.5 (GLOVE) ×2
GLOVE ECLIPSE 8.0 STRL XLNG CF (GLOVE) ×8 IMPLANT
GLOVE EXAM NITRILE LRG STRL (GLOVE) IMPLANT
GLOVE EXAM NITRILE XL STR (GLOVE) IMPLANT
GLOVE EXAM NITRILE XS STR PU (GLOVE) IMPLANT
GLOVE SURG SS PI 6.5 STRL IVOR (GLOVE) ×6 IMPLANT
GLOVE SURG SS PI 7.0 STRL IVOR (GLOVE) ×4 IMPLANT
GOWN STRL REUS W/ TWL LRG LVL3 (GOWN DISPOSABLE) IMPLANT
GOWN STRL REUS W/ TWL XL LVL3 (GOWN DISPOSABLE) IMPLANT
GOWN STRL REUS W/TWL 2XL LVL3 (GOWN DISPOSABLE) ×2 IMPLANT
GOWN STRL REUS W/TWL LRG LVL3 (GOWN DISPOSABLE) ×3
GOWN STRL REUS W/TWL XL LVL3 (GOWN DISPOSABLE) ×6
HEMOSTAT POWDER KIT SURGIFOAM (HEMOSTASIS) ×2 IMPLANT
KIT ACCESSORY (KITS) ×2
KIT ACCESSORY NEUROSURG SU (KITS) IMPLANT
KIT BASIN OR (CUSTOM PROCEDURE TRAY) ×3 IMPLANT
KIT ROOM TURNOVER OR (KITS) ×3 IMPLANT
LEAD DBS (Neuro Prosthesis/Implant) ×4 IMPLANT
MARKER SKIN DUAL TIP RULER LAB (MISCELLANEOUS) ×4 IMPLANT
NDL HYPO 25X1 1.5 SAFETY (NEEDLE) ×2 IMPLANT
NDL SPNL 18GX3.5 QUINCKE PK (NEEDLE) IMPLANT
NDL SPNL 22GX3.5 QUINCKE BK (NEEDLE) IMPLANT
NEEDLE HYPO 25X1 1.5 SAFETY (NEEDLE) ×3 IMPLANT
NEEDLE SPNL 18GX3.5 QUINCKE PK (NEEDLE) ×3 IMPLANT
NEEDLE SPNL 22GX3.5 QUINCKE BK (NEEDLE) ×3 IMPLANT
NS IRRIG 1000ML POUR BTL (IV SOLUTION) ×3 IMPLANT
OIL CARTRIDGE MAESTRO DRILL (MISCELLANEOUS) ×3
PACK LAMINECTOMY NEURO (CUSTOM PROCEDURE TRAY) ×3 IMPLANT
PAD ARMBOARD 7.5X6 YLW CONV (MISCELLANEOUS) ×16 IMPLANT
PAD DRESSING TELFA 3X8 NADH (GAUZE/BANDAGES/DRESSINGS) IMPLANT
PASSIVE HEADREST ACCESORY KIT ×2 IMPLANT
PERFORATOR LRG  14-11MM (BIT) ×2
PERFORATOR LRG 14-11MM (BIT) ×1 IMPLANT
PLATFORM BILATERAL KIT (MISCELLANEOUS) ×1 IMPLANT
SEALANT ADHERUS EXTEND TIP (MISCELLANEOUS) IMPLANT
SPONGE SURGIFOAM ABS GEL SZ50 (HEMOSTASIS) ×3 IMPLANT
STAPLER SKIN PROX WIDE 3.9 (STAPLE) ×1 IMPLANT
STAPLER VISISTAT 35W (STAPLE) ×2 IMPLANT
SUT ETHILON 3 0 PS 1 (SUTURE) IMPLANT
SUT SILK 2 0 TIES 10X30 (SUTURE) ×3 IMPLANT
SUT VIC AB 2-0 CP2 18 (SUTURE) ×5 IMPLANT
SYR CONTROL 10ML LL (SYRINGE) ×2 IMPLANT
TOWEL OR 17X24 6PK STRL BLUE (TOWEL DISPOSABLE) ×3 IMPLANT
TOWEL OR 17X26 10 PK STRL BLUE (TOWEL DISPOSABLE) ×3 IMPLANT
TRAY FOLEY W/METER SILVER 16FR (SET/KITS/TRAYS/PACK) IMPLANT
TUBE CONNECTING 12'X1/4 (SUCTIONS)
TUBE CONNECTING 12X1/4 (SUCTIONS) ×1 IMPLANT
WATER STERILE IRR 1000ML POUR (IV SOLUTION) ×3 IMPLANT

## 2016-03-22 NOTE — Progress Notes (Signed)
Pt to radiology for MRI.

## 2016-03-22 NOTE — Op Note (Signed)
03/22/2016  11:31 AM  PATIENT:  John Reese  65 y.o. male  PRE-OPERATIVE DIAGNOSIS:  ESSENTIAL TREMOR  POST-OPERATIVE DIAGNOSIS:  ESSENTIAL TREMOR  PROCEDURE:  Procedure(s): BILATERAL DEEP BRAIN STIMULATOR PLACEMENT VIM Thalamus WITH STARFIX WITH DR. [name] (Bilateral) with microelectrode recordings  SURGEON:  Surgeon(s) and Role:    * Erline Levine, MD - Primary  PHYSICIAN ASSISTANT:   ASSISTANTS: Poteat, RN   ANESTHESIA:   IV sedation  EBL:  Total I/O In: 500 [I.V.:500] Out: -   BLOOD ADMINISTERED:none  DRAINS: none   LOCAL MEDICATIONS USED:  MARCAINE    and LIDOCAINE   SPECIMEN:  No Specimen  DISPOSITION OF SPECIMEN:  N/A  COUNTS:  YES  TOURNIQUET:  * No tourniquets in log *  DICTATION: DICTATION:   Indications: Patient is a 65 year old man with Essential Tremor it was elected to take him to surgery for bilateral VIM Thalamus deep brain stimulator electrode placement  Procedure: Preoperative planing was performed with volumetricCT and placement of 4 fiducial markers in the skull followed by CT of the brain also obtained volumetrically. These were then exported to create Starfix head frame with planned targeting of VIM nucleus electrodes was performed. The patient was brought to the operating room and placed in a semi-Fowler's position with his neck and stabilized in a neck holder. This was affixed to the Mayfield adapter. His scalp was then prepped with betadine scrub and DuraPrep and subsequently draped with an Ioban drape with the fiducials and the skin was infiltrated with local lidocaine.  The areas of planned incision were then infiltrated with local lidocaine with epinephrine. The stepoffs were connected and the Starfix frame was assembled.  The entry points were then marked.  2 curvilinear incisions were made centered on the bilateral entry points. An elevator was used to clear pericranium from the skull. The perforator it was then used to produce two 14 mm bur  holes. The dura was coagulated with bipolar electrocautery. Initially we operated on the left and subsequently on the right. After opening the dura and a localizing the entry point the stylette and outer cannula were inserted into the brain. Duraseal was placed to prevent CSF leakage at each entry site. Microelectrode recordings were then performed and  we had very good recordings from the VIM for 7 mm. Subsequently the stimulating electrode was placed and the patient had significant improvement in tremor on the right side of the body without significant side effects to higher voltages. The electrode was then locked into position with the stimlock cap. The redundant electrode was circularized and tunneled under the scalp. The assembly was reassembled on the right.  There was good microelectrode recordings for 6.5 mm of VIM and after placing the stimulating electrode the patient had excellent control of tremor on the left side of his body.  We therefore elected to place this electrode and locked into position and the stereotactic assembly was disassembled. The electrode was tunneled into the posterior scalp and locked into position. The wounds were then irrigated and closed with 2-0 Vicryl sutures and staples. The fiducials were removed and staples were placed over each of these sites. The head was washed and then sterile occlusive dressings were placed. The patient was taken to recovery having tolerated her procedure without difficulty or untoward effect. Please refer to detailed microelectrode recordings from Dr. Carles Collet for more specifics of the positioning of the electrodes. These are included in her Epic note from the detailed neural monitoring and physical exam assessment  during the surgery.  PLAN OF CARE: Admit to inpatient   PATIENT DISPOSITION:  PACU - hemodynamically stable.   Delay start of Pharmacological VTE agent (>24hrs) due to surgical blood loss or risk of bleeding: yes

## 2016-03-22 NOTE — Transfer of Care (Signed)
Immediate Anesthesia Transfer of Care Note  Patient: John Reese  Procedure(s) Performed: Procedure(s): BILATERAL DEEP BRAIN STIMULATOR PLACEMENT WITH STARFIX WITH DR. [name] (Bilateral)  Patient Location: PACU  Anesthesia Type:MAC  Level of Consciousness: awake, alert  and oriented  Airway & Oxygen Therapy: Patient Spontanous Breathing and Patient connected to nasal cannula oxygen  Post-op Assessment: Report given to RN, Post -op Vital signs reviewed and stable and Patient moving all extremities X 4  Post vital signs: Reviewed and stable  Last Vitals:  Vitals:   03/22/16 0555  BP: (!) 166/78  Pulse: 75  Resp: 20  Temp: 36.4 C    Last Pain: There were no vitals filed for this visit.    Patients Stated Pain Goal: 7 ([id number])  Complications: No apparent anesthesia complications

## 2016-03-22 NOTE — Interval H&P Note (Signed)
History and Physical Interval Note:  03/22/2016 7:29 AM  John Reese  has presented today for surgery, with the diagnosis of ESSENTIAL TREMOR  The various methods of treatment have been discussed with the patient and family. After consideration of risks, benefits and other options for treatment, the patient has consented to  Procedure(s) with comments: BILATERAL DEEP BRAIN STIMULATOR PLACEMENT WITH STARFIX WITH DR. [name] (Bilateral) - BILATERAL DEEP BRAIN STIMULATOR PLACEMENT WITH STARFIX WITH DR. [name] as a surgical intervention .  The patient's history has been reviewed, patient examined, no change in status, stable for surgery.  I have reviewed the patient's chart and labs.  Questions were answered to the patient's satisfaction.     [name] D

## 2016-03-22 NOTE — H&P (View-Only) (Signed)
Patient ID:   [id number] Patient: John Reese  Date of Birth: [birth year] Visit Type: Office Visit   Date: 01/23/2016 11:30 AM Provider: Marchia Meiers. Vertell Limber MD   This 65 year old male presents for Tremor.  History of Present Illness: 1.  Tremor    [place], 65 year old retired male visits to discuss deep brain stimulator for essential tremor.  Patient is referred by Dr. Carles Collet.   History: IDDM, essential tremor, trigeminal neuralgia right treated with gabapentin 300 mg twice a day by [name], left index fingertip amputation Surgical history: Right eardrum surgery 1988, right wisdom tooth two weeks ago  MRI on [facility]        PAST MEDICAL/SURGICAL HISTORY   (Detailed)  Disease/disorder Onset Date Management Date Comments      [facility] 01/23/2016 -  Hearing loss    [facility] 01/23/2016 -  Hyperlipemia    [facility] 01/23/2016 -     PAST MEDICAL HISTORY, SURGICAL HISTORY, FAMILY HISTORY, SOCIAL HISTORY AND REVIEW OF SYSTEMS  01/23/2016, which I have signed.  Family History  (Detailed)   SOCIAL HISTORY  (Detailed) Tobacco use reviewed. Preferred language is Unknown.   Smoking status: Never smoker.  SMOKING STATUS Use Status Type Smoking Status Usage Per Day Years Used Total Pack Years  no/never  Never smoker             MEDICATIONS(added, continued or stopped this visit): Started Medication Directions Instruction Stopped   carbamazepine 200 mg tablet take 1 tablet by oral route  every 12 hours     clonazepam 0.5 mg tablet take 1 tablet by oral route  every day    01/23/2016 gabapentin 300 mg capsule take 1 capsule by oral route 3 times every day     metformin 500 mg tablet take 1 tablet by oral route 2 times every day with morning and evening meals     pravastatin 40 mg tablet take 1 tablet by oral route  every day     propanolol ER 20 ORAL      propanolol ER 20 ORAL      ramipril 2.5 mg capsule take 1 capsule by oral route  every day     Toujeo SoloStar 300 unit/mL (1.5  mL) subcutaneous insulin pen 15 untis daily       ALLERGIES: Ingredient Reaction Medication Name Comment  NO KNOWN ALLERGIES     No known allergies.    Vitals Date Temp F BP Pulse Ht In Wt Lb BMI BSA Pain Score  01/23/2016  140/80 76 73 157.4 20.77  0/10     PHYSICAL EXAM General Level of Distress: no acute distress Overall Appearance: normal  Head and Face  Right Left  Fundoscopic Exam:  normal normal    Cardiovascular Cardiac: regular rate and rhythm without murmur  Right Left  Carotid Pulses: normal normal  Respiratory Lungs: clear to auscultation  Neurological Orientation: normal Recent and Remote Memory: normal Attention Span and Concentration:   normal Language: normal Fund of Knowledge: normal  Right Left Sensation: normal normal Upper Extremity Coordination: normal normal  Lower Extremity Coordination: normal normal  Musculoskeletal Gait and Station: normal  Right Left Upper Extremity Muscle Strength: normal normal Lower Extremity Muscle Strength: normal normal Upper Extremity Muscle Tone:  tremor tremor Lower Extremity Muscle Tone: tremor tremor  Motor Strength Upper and lower extremity motor strength was tested in the clinically pertinent muscles.     Deep Tendon Reflexes  Right Left Biceps: normal normal Triceps: normal normal Brachiloradialis: normal  normal Patellar: normal normal Achilles: normal normal  Cranial Nerves II. Optic Nerve/Visual Fields: normal III. Oculomotor: normal IV. Trochlear: normal V. Trigeminal: normal VI. Abducens: normal VII. Facial: normal VIII. Acoustic/Vestibular: normal IX. Glossopharyngeal: normal X. Vagus: normal XI. Spinal Accessory: normal XII. Hypoglossal: normal  Motor and other Tests Lhermittes: negative Rhomberg: negative Pronator drift: absent     Right Left Hoffman's: normal normal Clonus: normal normal Babinski: normal normal   Additional Findings:  Significant essential  tremor in both arms (greater with postural activity and action), also pronounced at rest.    IMPRESSION The patient has significant essential tremor in both his arms greater than his legs. He has a lot of difficulty with daily activities, such as eating and dressing himself. He would like to proceed with surgery, so we will schedule a deep brain stimulator implantation for alleviation of his tremors.   Schedule DBS implantation. I discussed the details of surgery with the patient and his wife today.     MEDICATIONS PRESCRIBED TODAY    Rx Quantity Refills  GABAPENTIN 300 mg  0 0            Provider:  Marchia Meiers. Vertell Limber MD  01/23/2016 12:23 PM Dictation edited by: [name]    CC Providers: Alonza Bogus [address] [place], [place] [id number]              Electronically signed by Marchia Meiers. Vertell Limber MD on 01/23/2016 02:13 PM

## 2016-03-22 NOTE — Anesthesia Preprocedure Evaluation (Addendum)
Anesthesia Evaluation  Patient identified by MRN, date of birth, ID band Patient awake    Reviewed: Allergy & Precautions, NPO status , Patient's Chart, lab work & pertinent test results  Airway Mallampati: III  TM Distance: >3 FB     Dental   Pulmonary    breath sounds clear to auscultation       Cardiovascular  Rhythm:Regular Rate:Normal     Neuro/Psych    GI/Hepatic   Endo/Other  diabetes  Renal/GU      Musculoskeletal   Abdominal   Peds  Hematology   Anesthesia Other Findings   Reproductive/Obstetrics                            Anesthesia Physical Anesthesia Plan  ASA: III  Anesthesia Plan:    Post-op Pain Management:    Induction: Intravenous  Airway Management Planned: Natural Airway and Nasal Cannula  Additional Equipment:   Intra-op Plan:   Post-operative Plan:   Informed Consent: I have reviewed the patients History and Physical, chart, labs and discussed the procedure including the risks, benefits and alternatives for the proposed anesthesia with the patient or authorized representative who has indicated his/her understanding and acceptance.     Plan Discussed with: Anesthesiologist  Anesthesia Plan Comments:         Anesthesia Quick Evaluation

## 2016-03-22 NOTE — Procedures (Signed)
Preoperative diagnosis:  Essential tremor Postoperative diagnosis:  same Surgeon:  Erline Levine Neurologist:  Wells Guiles Akua Blethen  CPT codes: [phone] (first hour) [phone] (for each additional hour) Indication for procedure: Determination of optimal electrode position for deep brain stimulation therapy. Complications: None   Description of procedure:  Following the incision and burr hole placement, a recording microelectrode was slowly advanced into the brain via a motorized drive.  Beginning approximately 10 mm above the target, recordings were periodically made, and the resultant brain activity was described on the neurophysiologic recording worksheet.  Relevant samples of the recordings were saved for subsequent off line analysis.  A total of one recording pass was obtained on the left side of the brain.    6 mm of VIM were recorded from the left side of the brain.  Following the recording, the recording electrode was replaced by a stimulating electrode by Dr. Vertell Limber.  Test stimulations were then obtained.  Active contacts that were used and the response to stimulation, including both adverse effects and therapeutic effects, were recorded on the neurophysiologic stimulation worksheet.  In brief, there was complete resolution of  tremor following activation and stimulation.  Adverse effects only occurred at supratherapeutic voltages and consisted of hand paresthesia.  A target of mirror image of the initial side was selected for the contralateral side and one recording pass was obtained on the opposite side of the brain.  7 mm of VIM were recorded from the right side of the brain.  Tremor cells and response to passive stimulation were noted.  The recording electrode was again replaced by a stimulating electrode and test stimulations were obtained.  Again, there was resolution of tremor at therapeutic voltages with no side effects occurring at therapeutic voltages.   At supratherapeutic voltages, some side  effects were noted such as hand paresthesia.  Final electrode position was determined and the electrode was secured in place by Dr. Vertell Limber on each side.  Alonza Bogus, DO [facility] Director of Movement Disorders

## 2016-03-22 NOTE — Progress Notes (Signed)
Awake, alert, conversant.  No complaints.  Dressings CDI.  Strength full, no numbness.  Tremor much improved.

## 2016-03-22 NOTE — Brief Op Note (Signed)
03/22/2016  11:31 AM  PATIENT:  John Reese  64 y.o. male  PRE-OPERATIVE DIAGNOSIS:  ESSENTIAL TREMOR  POST-OPERATIVE DIAGNOSIS:  ESSENTIAL TREMOR  PROCEDURE:  Procedure(s): BILATERAL DEEP BRAIN STIMULATOR PLACEMENT VIM Thalamus WITH STARFIX WITH DR. [name] (Bilateral) with microelectrode recordings  SURGEON:  Surgeon(s) and Role:    * Erline Levine, MD - Primary  PHYSICIAN ASSISTANT:   ASSISTANTS: Poteat, RN   ANESTHESIA:   IV sedation  EBL:  Total I/O In: 500 [I.V.:500] Out: -   BLOOD ADMINISTERED:none  DRAINS: none   LOCAL MEDICATIONS USED:  MARCAINE    and LIDOCAINE   SPECIMEN:  No Specimen  DISPOSITION OF SPECIMEN:  N/A  COUNTS:  YES  TOURNIQUET:  * No tourniquets in log *  DICTATION: DICTATION:   Indications: Patient is a 65 year old man with Essential Tremor it was elected to take him to surgery for bilateral VIM Thalamus deep brain stimulator electrode placement  Procedure: Preoperative planing was performed with volumetricCT and placement of 4 fiducial markers in the skull followed by CT of the brain also obtained volumetrically. These were then exported to create Starfix head frame with planned targeting of VIM nucleus electrodes was performed. The patient was brought to the operating room and placed in a semi-Fowler's position with his neck and stabilized in a neck holder. This was affixed to the Mayfield adapter. His scalp was then prepped with betadine scrub and DuraPrep and subsequently draped with an Ioban drape with the fiducials and the skin was infiltrated with local lidocaine.  The areas of planned incision were then infiltrated with local lidocaine with epinephrine. The stepoffs were connected and the Starfix frame was assembled.  The entry points were then marked.  2 curvilinear incisions were made centered on the bilateral entry points. An elevator was used to clear pericranium from the skull. The perforator it was then used to produce two 14 mm bur  holes. The dura was coagulated with bipolar electrocautery. Initially we operated on the left and subsequently on the right. After opening the dura and a localizing the entry point the stylette and outer cannula were inserted into the brain. Duraseal was placed to prevent CSF leakage at each entry site. Microelectrode recordings were then performed and  we had very good recordings from the VIM for 7 mm. Subsequently the stimulating electrode was placed and the patient had significant improvement in tremor on the right side of the body without significant side effects to higher voltages. The electrode was then locked into position with the stimlock cap. The redundant electrode was circularized and tunneled under the scalp. The assembly was reassembled on the right.  There was good microelectrode recordings for 6.5 mm of VIM and after placing the stimulating electrode the patient had excellent control of tremor on the left side of his body.  We therefore elected to place this electrode and locked into position and the stereotactic assembly was disassembled. The electrode was tunneled into the posterior scalp and locked into position. The wounds were then irrigated and closed with 2-0 Vicryl sutures and staples. The fiducials were removed and staples were placed over each of these sites. The head was washed and then sterile occlusive dressings were placed. The patient was taken to recovery having tolerated her procedure without difficulty or untoward effect. Please refer to detailed microelectrode recordings from Dr. Carles Collet for more specifics of the positioning of the electrodes. These are included in her Epic note from the detailed neural monitoring and physical exam assessment  during the surgery.  PLAN OF CARE: Admit to inpatient   PATIENT DISPOSITION:  PACU - hemodynamically stable.   Delay start of Pharmacological VTE agent (>24hrs) due to surgical blood loss or risk of bleeding: yes

## 2016-03-22 NOTE — Anesthesia Postprocedure Evaluation (Signed)
Anesthesia Post Note  Patient: John Reese  Procedure(s) Performed: Procedure(s) (LRB): BILATERAL DEEP BRAIN STIMULATOR PLACEMENT WITH STARFIX WITH DR. [name] (Bilateral)  Patient location during evaluation: PACU Anesthesia Type: MAC Level of consciousness: awake, awake and alert and oriented Pain management: pain level controlled Vital Signs Assessment: post-procedure vital signs reviewed and stable Respiratory status: spontaneous breathing, nonlabored ventilation and respiratory function stable Cardiovascular status: blood pressure returned to baseline Anesthetic complications: no    Last Vitals:  Vitals:   03/22/16 1245 03/22/16 1300  BP:  (!) 143/83  Pulse: 65 62  Resp: 15 14  Temp:      Last Pain: There were no vitals filed for this visit.               Rocsi Hazelbaker COKER

## 2016-03-23 ENCOUNTER — Inpatient Hospital Stay (HOSPITAL_COMMUNITY): Payer: PPO

## 2016-03-23 LAB — GLUCOSE, CAPILLARY
GLUCOSE-CAPILLARY: 106 mg/dL — AB (ref 65–99)
GLUCOSE-CAPILLARY: 104 mg/dL — AB (ref 65–99)

## 2016-03-23 MED ORDER — HYDROCODONE-ACETAMINOPHEN 5-325 MG PO TABS: 1.0000 | ORAL_TABLET | ORAL | 0 refills | Status: DC | PRN

## 2016-03-23 MED FILL — Thrombin For Soln 5000 Unit: CUTANEOUS | Qty: 1 | Status: AC

## 2016-03-23 NOTE — Evaluation (Signed)
Physical Therapy Evaluation Patient Details Name: John Reese MRN: [id number] DOB: 1951-01-06 Today's Date: 03/23/2016   History of Present Illness  Pt is a 65 y/o male who presents s/p subthalamic deep brain stimulator placement on 03/22/16 for essential tremor.   Clinical Impression  This patient presents with acute pain and decreased functional independence following the above mentioned procedure. At the time of PT eval, pt with several LOB during gait training requiring min-mod assist for recovery. Highly recommend use of RW until balance/knee buckling improves. Pt and wife educated on general safety awareness and precautions for fall prevention at home. This patient is appropriate for skilled PT interventions to address functional limitations, improve safety and independence with functional mobility, and return to PLOF.     Follow Up Recommendations Supervision for mobility/OOB;Outpatient PT    Equipment Recommendations  3in1 (PT)    Recommendations for Other Services       Precautions / Restrictions Precautions Precautions: Fall Precaution Comments: Very unstable with knee buckling and overall jerkiness (not the same as essential tremor). Restrictions Weight Bearing Restrictions: No      Mobility  Bed Mobility               General bed mobility comments: sitting on eob ona rrival  Transfers Overall transfer level: Needs assistance Equipment used: Rolling walker (2 wheeled) Transfers: Sit to/from Stand Sit to Stand: Min assist         General transfer comment: (A) for balance and incr time to allow BIL LE to complete static standing  Ambulation/Gait Ambulation/Gait assistance: Min assist;Mod assist Ambulation Distance (Feet): 200 Feet Assistive device: Rolling walker (2 wheeled) Gait Pattern/deviations: Step-through pattern;Decreased stride length;Scissoring;Narrow base of support Gait velocity: Decreased Gait velocity interpretation: Below normal speed  for age/gender General Gait Details: Pt demonstrating scissoring gait pattern initially, with narrow BOS and tandem walk noted at times after. Pt with several instances of knee buckling requiring min to mod assist to recover. Buckling occuring during ambulation as well as during static standing.   Stairs            Wheelchair Mobility    Modified Rankin (Stroke Patients Only)       Balance Overall balance assessment: Needs assistance Sitting-balance support: Bilateral upper extremity supported;Feet supported Sitting balance-Leahy Scale: Fair Sitting balance - Comments: Difficulty holding upright posture with MMT   Standing balance support: Single extremity supported Standing balance-Leahy Scale: Poor Standing balance comment: requires leaning on sink for support indicating fall risk                             Pertinent Vitals/Pain Pain Assessment: Faces Faces Pain Scale: Hurts a little bit Pain Location: head Pain Descriptors / Indicators: Discomfort;Operative site guarding Pain Intervention(s): Premedicated before session;Monitored during session;Repositioned    Home Living Family/patient expects to be discharged to:: Private residence Living Arrangements: Spouse/significant other Available Help at Discharge: Family;Available 24 hours/day Type of Home: House Home Access: Stairs to enter   [employer] of Steps: 3 Home Layout: One level Home Equipment: Environmental consultant - 2 wheels (borrow from pt's mother)      Prior Function Level of Independence: Independent               Hand Dominance   Dominant Hand: Right    Extremity/Trunk Assessment   Upper Extremity Assessment: RUE deficits/detail;LUE deficits/detail RUE Deficits / Details: tremors     LUE Deficits / Details: tremors  Lower Extremity Assessment: Defer to PT evaluation   LLE Deficits / Details: Bilaterally, pt with difficulty holding a contraction for MMT. L appears more  "jerky" than right with quads/hams testing, and pt had difficulty maintaining R knee elevation for hip flexor testing.   Cervical / Trunk Assessment: Other exceptions  Communication   Communication: HOH  Cognition Arousal/Alertness: Awake/alert Behavior During Therapy: WFL for tasks assessed/performed Overall Cognitive Status: Impaired/Different from baseline Area of Impairment: Memory;Problem solving;Safety/judgement     Memory: Decreased recall of precautions   Safety/Judgement: Decreased awareness of safety   Problem Solving: Difficulty sequencing General Comments: Pt demonstrates executive functioning deficits this session. Pt was unable to answer simple money questions and this could greatly affect his ability to complete medication management. pt and wife advised to complete all medications together. wife initially states "he does all his medication" Wife education on the reason (A) needs to be provided. pt with decr recall and needs cues. Daughter reports "this isnt his normal he normally is better than this" Pt demonstrates impulsive behaviors    General Comments      Exercises     Assessment/Plan    PT Assessment Patient needs continued PT services  PT Problem List Decreased strength;Decreased activity tolerance;Decreased range of motion;Decreased balance;Decreased mobility;Decreased knowledge of use of DME;Decreased knowledge of precautions;Decreased safety awareness;Pain;Decreased coordination          PT Treatment Interventions DME instruction;Gait training;Stair training;Functional mobility training;Therapeutic activities;Therapeutic exercise;Neuromuscular re-education;Patient/family education    PT Goals (Current goals can be found in the Care Plan section)  Acute Rehab PT Goals Patient Stated Goal: to return home to dog and cat PT Goal Formulation: With patient Time For Goal Achievement: 03/30/16 Potential to Achieve Goals: Good    Frequency Min 5X/week    Barriers to discharge        Co-evaluation               End of Session Equipment Utilized During Treatment: Gait belt Activity Tolerance: Patient limited by fatigue Patient left: in bed;with call bell/phone within reach;with family/visitor present Nurse Communication: Mobility status         Time: [id number] PT Time Calculation (min) (ACUTE ONLY): 28 min   Charges:   PT Evaluation $PT Eval Moderate Complexity: 1 Procedure PT Treatments $Gait Training: 8-22 mins   PT G Codes:        Thelma Comp Apr 20, 2016, 10:31 AM  Rolinda Roan, PT, DPT Acute Rehabilitation Services Pager: [phone]

## 2016-03-23 NOTE — Progress Notes (Signed)
Pt and family given D/C instructions with Rx, verbal understanding was provided. Pt's incision is clean and dry with no sign of infection. Pt's IV was removed prior to D/C. Pt D/C'd home via wheelchair @ 1340 per MD order. Pt is stable @ D/C and has no other needs at this time. Holli Humbles, RN

## 2016-03-23 NOTE — Progress Notes (Addendum)
Subjective: Patient reports "I feel pretty good. I'm ready to go home." "I got real weak"  Objective: Vital signs in last 24 hours: Temp:  [97.9 F (36.6 C)-99.5 F (37.5 C)] 99.5 F (37.5 C) (12/01 0515) Pulse Rate:  [56-88] 88 (12/01 0515) Resp:  [10-20] 18 (12/01 0515) BP: (128-158)/(61-83) 129/69 (12/01 0515) SpO2:  [96 %-100 %] 99 % (12/01 0515)  Intake/Output from previous day: 11/30 0701 - 12/01 0700 In: 740 [P.O.:240; I.V.:500] Out: 400 [Urine:300; Blood:100] Intake/Output this shift: No intake/output data recorded.  Alert, conversant, speaking fluently, without facial asymmetry. PEARLA. No drift.  Wife present reports pt fell against wall at 2am when OOB for bathroom. Staff assisted pt, preventing fall to floor and lifting pt back to bed. Reported progression of bilat leg weakness to include BUE weakness. Order re'cd from [facility] on call for CT. Incisions with drsgs intact, some bloody drainage as expected.   Lab Results: No results for input(s): WBC, HGB, HCT, PLT in the last 72 hours. BMET No results for input(s): NA, K, CL, CO2, GLUCOSE, BUN, CREATININE, CALCIUM in the last 72 hours.  Studies/Results: Ct Head Wo Contrast  Result Date: 03/23/2016 CLINICAL DATA:  Bilateral lower extremity weakness EXAM: CT HEAD WITHOUT CONTRAST TECHNIQUE: Contiguous axial images were obtained from the base of the skull through the vertex without intravenous contrast. COMPARISON:  Brain MRI 03/22/2016 FINDINGS: Brain: Bilateral deep brain stimulator leads are in the expected location. No visualized hemorrhage along the lead tracts. There is small volume bifrontal pneumocephalus. There is no intraparenchymal hemorrhage. No midline shift or mass effect. No CT evidence of acute cortical infarct. No visible extra-axial blood products. Vascular: No hyperdense vessel or unexpected calcification. Skull: Bilateral frontal burr holes. Sinuses/Orbits: Moderate ethmoid mucosal thickening. Fluid  levels within the sphenoid sinuses. Other: None IMPRESSION: 1. Bilateral deep brain stimulator leads in expected location. 2. Small volume postoperative pneumocephalus without extra-axial hematoma or collection. Electronically Signed   By: Ulyses Jarred M.D.   On: 03/23/2016 06:35   Mr Brain Wo Contrast  Result Date: 03/22/2016 CLINICAL DATA:  65 year old male status post deep brain stimulator lead placement. Initial encounter. EXAM: MRI HEAD WITHOUT CONTRAST TECHNIQUE: Multiplanar, multiecho pulse sequences of the brain and surrounding structures were obtained without intravenous contrast. COMPARISON:  Preoperative head CT 03/13/2016.  Brain MRI 12/30/2015. FINDINGS: Bilateral vertex approach deep brain stimulator leads are in place, coursing to the subthalamic nucleus region bilaterally. Susceptibility artifact but no other signal abnormality along the course of both leads. No intracranial mass effect, ventriculomegaly, or acute intracranial process is evident. Fiducials remain in place. IMPRESSION: Bilateral deep brain stimulator leads placed with no adverse features. Study discussed by telephone with Dr. Wells Guiles [name] on 03/22/2016 at 15:24 . Electronically Signed   By: Genevie Ann M.D.   On: 03/22/2016 15:24    Assessment/Plan: Stable at present  LOS: 1 day  Discussed events and exam with DrStern. Will observe this morning. Mobilze as tolerated. Ok to change scalp dressings, cleansing incisions gently.    Verdis Prime 03/23/2016, 7:26 AM   Patient had episode of weakness, now resolved.  Head CT shows pneumocephalus, electrodes well positioned.  Exam today shows full strength, fluent speech, no drift, no diplopia.  Mobilize with PT and if doing well, D/C home.

## 2016-03-23 NOTE — Progress Notes (Signed)
Pt received rolling walker & 3-n-1 from [place] prior to D/C. Holli Humbles, RN

## 2016-03-23 NOTE — Evaluation (Signed)
Occupational Therapy Evaluation Patient Details Name: John Reese MRN: [id number] DOB: Feb 04, 1951 Today's Date: 03/23/2016    History of Present Illness Pt is a 65 y/o male who presents s/p subthalamic deep brain stimulator placement on 03/22/16 for essential tremor.    Clinical Impression   Patient is s/p s/p subthalamic deep brain stimulator placement surgery resulting in functional limitations due to the deficits listed below (see OT problem list). PTA was requiring minimal help from wife. Pt would complete IADLS with wife even. Pt currently with high fall risk and cognitive deficits. Pt will require (A) for medication management at this time.  Patient will benefit from skilled OT acutely to increase independence and safety with ADLS to allow discharge follow up recommended due to balance deficits at outpatient however patient with pending surgery next week as follow up. Pt will unlikely be able to attend follow up in this amount of time and will be home with wife. Wife advised on fall prevention, making chairs/ seats available throughout the home for him to sit quickly and the use of the wall to aid in any potential falls. Daughter also present during education and helping reinforce to mother and patient.      Follow Up Recommendations  Other (comment) (recommend follow up outpatient )    Equipment Recommendations  3 in 1 bedside comode;Other (comment) (RW)    Recommendations for Other Services       Precautions / Restrictions Precautions Precautions: Fall Precaution Comments: Very unstable with knee buckling and overall jerkiness (not the same as essential tremor). Restrictions Weight Bearing Restrictions: No      Mobility Bed Mobility               General bed mobility comments: sitting on eob ona rrival  Transfers Overall transfer level: Needs assistance Equipment used: Rolling walker (2 wheeled) Transfers: Sit to/from Stand Sit to Stand: Min assist          General transfer comment: (A) for balance and incr time to allow BIL LE to complete static standing    Balance Overall balance assessment: Needs assistance Sitting-balance support: Bilateral upper extremity supported;Feet supported Sitting balance-Leahy Scale: Fair Sitting balance - Comments: Difficulty holding upright posture with MMT   Standing balance support: Single extremity supported Standing balance-Leahy Scale: Poor Standing balance comment: requires leaning on sink for support indicating fall risk                            ADL Overall ADL's : Needs assistance/impaired Eating/Feeding: Set up Eating/Feeding Details (indicate cue type and reason): sitting with (A) to open containers. Pt with tremors knocking food off fork at times Grooming: Wash/dry hands;Min guard;Standing Grooming Details (indicate cue type and reason): cues to use of soap and to scan environment to locate soap             Lower Body Dressing: Minimal assistance Lower Body Dressing Details (indicate cue type and reason): able to cross bil LE seating but will require (A) in static standing to prevent falls Toilet Transfer: Minimal assistance Toilet Transfer Details (indicate cue type and reason): wife requesting 3n1 and pt reports disliek due to height         Functional mobility during ADLs: Minimal assistance;Rolling walker General ADL Comments: pt currently lacks awareness to fall risk and to have (A) for all transfer. pt states "oh i can get up in there if i need to no problem." pt  with L Le buckle several times during session.      Vision     Perception     Praxis      Pertinent Vitals/Pain Pain Assessment: Faces Faces Pain Scale: Hurts a little bit Pain Location: head Pain Descriptors / Indicators: Discomfort;Operative site guarding Pain Intervention(s): Premedicated before session;Monitored during session;Repositioned     Hand Dominance Right    Extremity/Trunk Assessment Upper Extremity Assessment Upper Extremity Assessment: RUE deficits/detail;LUE deficits/detail RUE Deficits / Details: tremors LUE Deficits / Details: tremors   Lower Extremity Assessment Lower Extremity Assessment: Defer to PT evaluation LLE Deficits / Details: Bilaterally, pt with difficulty holding a contraction for MMT. L appears more "jerky" than right with quads/hams testing, and pt had difficulty maintaining R knee elevation for hip flexor testing.    Cervical / Trunk Assessment Cervical / Trunk Assessment: Other exceptions Cervical / Trunk Exceptions: foward head and rotated shoulders   Communication Communication Communication: HOH   Cognition Arousal/Alertness: Awake/alert Behavior During Therapy: WFL for tasks assessed/performed Overall Cognitive Status: Impaired/Different from baseline Area of Impairment: Memory;Problem solving;Safety/judgement     Memory: Decreased recall of precautions   Safety/Judgement: Decreased awareness of safety   Problem Solving: Difficulty sequencing General Comments: Pt demonstrates executive functioning deficits this session. Pt was unable to answer simple money questions and this could greatly affect his ability to complete medication management. pt and wife advised to complete all medications together. wife initially states "he does all his medication" Wife education on the reason (A) needs to be provided. pt with decr recall and needs cues. Daughter reports "this isnt his normal he normally is better than this" Pt demonstrates impulsive behaviors   General Comments       Exercises       Shoulder Instructions      Home Living Family/patient expects to be discharged to:: Private residence Living Arrangements: Spouse/significant other Available Help at Discharge: Family;Available 24 hours/day Type of Home: House Home Access: Stairs to enter [employer] of Steps: 3   Home Layout: One level      Bathroom Shower/Tub: Teacher, early years/pre: Standard Bathroom Accessibility: Yes   Home Equipment: Environmental consultant - 2 wheels (borrow from pt's mother)          Prior Functioning/Environment Level of Independence: Independent                 OT Problem List: Decreased strength;Decreased activity tolerance;Impaired balance (sitting and/or standing);Decreased safety awareness;Decreased knowledge of use of DME or AE;Decreased knowledge of precautions;Pain;Decreased coordination;Decreased cognition   OT Treatment/Interventions: Self-care/ADL training;Therapeutic exercise;Neuromuscular education;DME and/or AE instruction;Therapeutic activities;Cognitive remediation/compensation;Patient/family education;Balance training    OT Goals(Current goals can be found in the care plan section) Acute Rehab OT Goals Patient Stated Goal: to return home to dog and cat OT Goal Formulation: With patient/family Time For Goal Achievement: 04/06/16 Potential to Achieve Goals: Good  OT Frequency: Min 3X/week   Barriers to D/C:            Co-evaluation              End of Session Equipment Utilized During Treatment: Gait belt;Rolling walker Nurse Communication: Mobility status;Precautions  Activity Tolerance: Patient tolerated treatment well Patient left: in bed;with call bell/phone within reach;with family/visitor present   Time: 0900-0928 OT Time Calculation (min): 28 min Charges:  OT General Charges $OT Visit: 1 Procedure OT Evaluation $OT Eval Moderate Complexity: 1 Procedure OT Treatments $Self Care/Home Management : 8-22 mins G-Codes:    Ronnald Ramp,  Steffanie Dunn 03/23/2016, 9:47 AM   Jeri Modena   OTR/L Pager: [phone] Office: [phone] .

## 2016-03-23 NOTE — Progress Notes (Signed)
Updated on Call MD-Dr Kathyrn Sheriff about patient status after the incident, New order for CT head received. Will follow up result. Will cont to monitor pt closely.

## 2016-03-23 NOTE — Progress Notes (Signed)
Heard a loud bang in the patients room. Per CNA, he's assisting him to the BR but patient legs weaken and hit the wall and about to go down to  the floor but CNA was able to catch and assist him to bed without hitting the floor. Per witnesses, patient hit the back of his head on the wall . Old bloody drainage noted under the dressing, pt alert and oriented x2, denies pain, CBG 106.On call MD , Dr Kathyrn Sheriff notified and made aware about incident. Order to just monitor pt closely for now . RRRN notified and came to check on patient. Patients wife in the room and witnessed the incident.  Will cont to monitor patient closely.   03/23/16 0215  Vitals  Temp 98.8 F (37.1 C)  Temp Source Oral  BP 131/70  BP Location Right Arm  BP Method Automatic  Patient Position (if appropriate) Lying  Pulse Rate 83  Pulse Rate Source Monitor  Resp 18  Oxygen Therapy  SpO2 96 %  O2 Device Room Air

## 2016-03-23 NOTE — Significant Event (Signed)
Rapid Response Event Note Call received per floor RN regarding Pt who hit head on wall after loosing balance en route back to bed from bathroom. Witnessed and assisted by CNA. Pt lost balance shortly and leaned into wall hitting his head. Pt did not fall to floor, ambulated with assistance back to bed.  RN advised to get full set of VS and page on call MD while RRT en route.   Overview: Time Called: 0210 Arrival Time: 0218 Event Type: Neurologic  Initial Focused Assessment: Pt found resting in bed alert, oriented to self mildly confused about time and location. Complains of mild head ache per Pt unchanged from before hitting head. MAEW x4 with slight tremor in bilateral arms and legs. Skin warm and dry. Head incision dressing intact, blood seen under clear dressing unchanged in assessment per floor RN.    Interventions: Dr. Kathyrn Sheriff paged and updated on Pt assessment per floor RN. Per MD to monitor Pt closely tonight, no orders for CT scan at this time.   Plan of Care (if not transferred): RN to continue to monitor Pt closely and up date Provider and myself for worsening changes.   Event Summary: Name of Physician Notified: MD on call for Dr. Vertell Limber  at 0220  Dr. Kathyrn Sheriff     at    Outcome: Stayed in room and stabalized  Event End Time: 0242  Marikay Alar

## 2016-03-23 NOTE — Discharge Summary (Signed)
Physician Discharge Summary  Patient ID: John Reese MRN: [id number] DOB/AGE: 21-Sep-1950 65 y.o.  Admit date: 03/22/2016 Discharge date: 03/23/2016  Admission Diagnoses:Tremor  Discharge Diagnoses: Tremor Active Problems:   Essential tremor   Discharged Condition: good  Hospital Course: Patient underwent bilateral DBS for Essential tremor with VIM electrodes.  He did well with surgery.  He had an episode of weakness last night.  Head CT showed mild pneumocephalus, no complications.  Episode resolved and patient was discharged home after working with PT.  Consults: None  Significant Diagnostic Studies: radiology: MRI: Electrodes well positioned. CT mild pneumocephalus, no hematoma.  Treatments: surgery: bilateral DBS for Essential tremor with VIM electrodes  Discharge Exam: Blood pressure 129/69, pulse 88, temperature 99.5 F (37.5 C), temperature source Oral, resp. rate 18, height 6\' 1"  (1.854 m), weight 73.3 kg (161 lb 9.6 oz), SpO2 99 %. Neurologic: Alert and oriented X 3, normal strength and tone. Normal symmetric reflexes. Normal coordination and gait Wound:CDI  Disposition: Home     Medication List    STOP taking these medications   cephALEXin 250 MG capsule Commonly known as:  KEFLEX     TAKE these medications   carbamazepine 200 MG tablet Commonly known as:  TEGRETOL TAKE 1 AND 1/2 TABLETS IN THE MORNING, 1 TABLET AT MIDDAY AND 1 AND 1/2 TABLETS IN THE EVENING What changed:  See the new instructions.   CINNAMON PO Take 1,000 mg by mouth daily.   clonazePAM 0.25 MG disintegrating tablet Commonly known as:  KLONOPIN DISSOLVE 1 TABLET TWICE A DAY   fish oil-omega-3 fatty acids 1000 MG capsule Take 2 g by mouth daily.   gabapentin 400 MG capsule Commonly known as:  NEURONTIN Take 1 capsule (400 mg total) by mouth 3 (three) times daily.   HYDROcodone-acetaminophen 5-325 MG tablet Commonly known as:  NORCO/VICODIN Take 1 tablet by mouth every 6 (six)  hours as needed for moderate pain. What changed:  Another medication with the same name was added. Make sure you understand how and when to take each.   HYDROcodone-acetaminophen 5-325 MG tablet Commonly known as:  NORCO/VICODIN Take 1-2 tablets by mouth every 4 (four) hours as needed (mild pain). What changed:  You were already taking a medication with the same name, and this prescription was added. Make sure you understand how and when to take each.   metFORMIN 500 MG tablet Commonly known as:  GLUCOPHAGE Take 1 tablet (500 mg total) by mouth 2 (two) times daily with a meal.   multivitamin tablet Take 1 tablet by mouth daily.   pravastatin 40 MG tablet Commonly known as:  PRAVACHOL Take 40 mg by mouth daily.   propranolol 40 MG tablet Commonly known as:  INDERAL TAKE 2 TABLETS (80 MG TOTAL) BY MOUTH 2 (TWO) TIMES DAILY.   ramipril 2.5 MG capsule Commonly known as:  ALTACE Take 2.5 mg by mouth daily.   TOUJEO SOLOSTAR 300 UNIT/ML Sopn Generic drug:  Insulin Glargine Inject 15 Units as directed at bedtime.   ULTICARE MINI PEN NEEDLES 31G X 6 MM Misc Generic drug:  Insulin Pen Needle USE AS DIRECTED AT BEDTIME        Signed: Peggyann Shoals, MD 03/23/2016, 7:44 AM

## 2016-03-27 ENCOUNTER — Encounter (HOSPITAL_COMMUNITY): Payer: Self-pay | Admitting: Neurosurgery

## 2016-03-30 ENCOUNTER — Encounter (HOSPITAL_COMMUNITY): Payer: Self-pay | Admitting: Certified Registered"

## 2016-03-30 ENCOUNTER — Ambulatory Visit (HOSPITAL_COMMUNITY): Payer: PPO | Admitting: Certified Registered"

## 2016-03-30 ENCOUNTER — Observation Stay (HOSPITAL_COMMUNITY)
Admission: RE | Admit: 2016-03-30 | Discharge: 2016-03-30 | Disposition: A | Discharge status: Home / Self Care | Payer: PPO | Source: Ambulatory Visit | Attending: Neurosurgery | Admitting: Neurosurgery

## 2016-03-30 ENCOUNTER — Encounter (HOSPITAL_COMMUNITY)
Admission: RE | Disposition: A | Discharge status: Home / Self Care | Payer: Self-pay | Source: Ambulatory Visit | Attending: Neurosurgery

## 2016-03-30 DIAGNOSIS — E785 Hyperlipidemia, unspecified: Secondary | ICD-10-CM | POA: Insufficient documentation

## 2016-03-30 DIAGNOSIS — E119 Type 2 diabetes mellitus without complications: Secondary | ICD-10-CM | POA: Insufficient documentation

## 2016-03-30 DIAGNOSIS — Z89022 Acquired absence of left finger(s): Secondary | ICD-10-CM | POA: Diagnosis not present

## 2016-03-30 DIAGNOSIS — G500 Trigeminal neuralgia: Secondary | ICD-10-CM | POA: Insufficient documentation

## 2016-03-30 DIAGNOSIS — I10 Essential (primary) hypertension: Secondary | ICD-10-CM | POA: Diagnosis not present

## 2016-03-30 DIAGNOSIS — Z794 Long term (current) use of insulin: Secondary | ICD-10-CM | POA: Diagnosis not present

## 2016-03-30 DIAGNOSIS — G250 Essential tremor: Secondary | ICD-10-CM | POA: Diagnosis not present

## 2016-03-30 HISTORY — PX: PULSE GENERATOR IMPLANT: SHX5370

## 2016-03-30 LAB — GLUCOSE, CAPILLARY
GLUCOSE-CAPILLARY: 180 mg/dL — AB (ref 65–99)
Glucose-Capillary: 137 mg/dL — ABNORMAL HIGH (ref 65–99)
Glucose-Capillary: 116 mg/dL — ABNORMAL HIGH (ref 65–99)

## 2016-03-30 SURGERY — BILATERAL PULSE GENERATOR IMPLANT
Anesthesia: General | Site: Head | Laterality: Bilateral

## 2016-03-30 MED ORDER — PROPRANOLOL HCL 40 MG PO TABS: 40.0000 mg | ORAL_TABLET | Freq: Two times a day (BID) | ORAL | Status: DC

## 2016-03-30 MED ORDER — METFORMIN HCL 500 MG PO TABS: 500.0000 mg | ORAL_TABLET | Freq: Two times a day (BID) | ORAL | Status: DC

## 2016-03-30 MED ORDER — VANCOMYCIN HCL 1000 MG IV SOLR
INTRAVENOUS | Status: AC
  Filled 2016-03-30: qty 1000

## 2016-03-30 MED ORDER — ACETAMINOPHEN 325 MG PO TABS: 650.0000 mg | ORAL_TABLET | ORAL | Status: DC | PRN

## 2016-03-30 MED ORDER — BACITRACIN ZINC 500 UNIT/GM EX OINT
TOPICAL_OINTMENT | CUTANEOUS | Status: DC | PRN
  Administered 2016-03-30 (×2): 1 via TOPICAL

## 2016-03-30 MED ORDER — ALUM & MAG HYDROXIDE-SIMETH 200-200-20 MG/5ML PO SUSP: 30.0000 mL | Freq: Four times a day (QID) | ORAL | Status: DC | PRN

## 2016-03-30 MED ORDER — SODIUM CHLORIDE 0.9% FLUSH: 3.0000 mL | Freq: Two times a day (BID) | INTRAVENOUS | Status: DC

## 2016-03-30 MED ORDER — FENTANYL CITRATE (PF) 100 MCG/2ML IJ SOLN
INTRAMUSCULAR | Status: AC
  Filled 2016-03-30: qty 2

## 2016-03-30 MED ORDER — ROCURONIUM BROMIDE 10 MG/ML (PF) SYRINGE
PREFILLED_SYRINGE | INTRAVENOUS | Status: AC
  Filled 2016-03-30: qty 10

## 2016-03-30 MED ORDER — OMEGA-3-ACID ETHYL ESTERS 1 G PO CAPS: 2.0000 g | ORAL_CAPSULE | Freq: Every day | ORAL | Status: DC

## 2016-03-30 MED ORDER — HYDROMORPHONE HCL 1 MG/ML IJ SOLN: 0.5000 mg | INTRAMUSCULAR | Status: DC | PRN

## 2016-03-30 MED ORDER — METHOCARBAMOL 1000 MG/10ML IJ SOLN: 500.0000 mg | Freq: Four times a day (QID) | INTRAVENOUS | Status: DC | PRN

## 2016-03-30 MED ORDER — ACETAMINOPHEN 650 MG RE SUPP: 650.0000 mg | RECTAL | Status: DC | PRN

## 2016-03-30 MED ORDER — LIDOCAINE-EPINEPHRINE (PF) 2 %-1:200000 IJ SOLN
INTRAMUSCULAR | Status: AC
  Filled 2016-03-30: qty 20

## 2016-03-30 MED ORDER — METHOCARBAMOL 500 MG PO TABS: 500.0000 mg | ORAL_TABLET | Freq: Four times a day (QID) | ORAL | Status: DC | PRN

## 2016-03-30 MED ORDER — ADULT MULTIVITAMIN W/MINERALS CH: 1.0000 | ORAL_TABLET | Freq: Every day | ORAL | Status: DC

## 2016-03-30 MED ORDER — LIDOCAINE-EPINEPHRINE (PF) 2 %-1:200000 IJ SOLN
INTRAMUSCULAR | Status: DC | PRN
  Administered 2016-03-30: 8.5 mL
  Administered 2016-03-30: 5 mL

## 2016-03-30 MED ORDER — RAMIPRIL 2.5 MG PO CAPS
2.5000 mg | ORAL_CAPSULE | Freq: Every day | ORAL | Status: DC
  Filled 2016-03-30: qty 1

## 2016-03-30 MED ORDER — CEFAZOLIN SODIUM 1 G IJ SOLR
INTRAMUSCULAR | Status: AC
  Filled 2016-03-30: qty 20

## 2016-03-30 MED ORDER — PROPOFOL 10 MG/ML IV BOLUS
INTRAVENOUS | Status: DC | PRN
  Administered 2016-03-30: 200 mg via INTRAVENOUS

## 2016-03-30 MED ORDER — SUGAMMADEX SODIUM 200 MG/2ML IV SOLN
INTRAVENOUS | Status: DC | PRN
  Administered 2016-03-30: 150 mg via INTRAVENOUS

## 2016-03-30 MED ORDER — MIDAZOLAM HCL 2 MG/2ML IJ SOLN
INTRAMUSCULAR | Status: AC
  Filled 2016-03-30: qty 2

## 2016-03-30 MED ORDER — SODIUM CHLORIDE 0.9% FLUSH: 3.0000 mL | INTRAVENOUS | Status: DC | PRN

## 2016-03-30 MED ORDER — FLEET ENEMA 7-19 GM/118ML RE ENEM: 1.0000 | ENEMA | Freq: Once | RECTAL | Status: DC | PRN

## 2016-03-30 MED ORDER — DOCUSATE SODIUM 100 MG PO CAPS
100.0000 mg | ORAL_CAPSULE | Freq: Two times a day (BID) | ORAL | Status: DC
Start: 2016-03-30 — End: 2016-03-30

## 2016-03-30 MED ORDER — CEFAZOLIN SODIUM 1 G IJ SOLR
INTRAMUSCULAR | Status: DC | PRN
  Administered 2016-03-30: 2 g via INTRAMUSCULAR

## 2016-03-30 MED ORDER — CARBAMAZEPINE 200 MG PO TABS: 200.0000 mg | ORAL_TABLET | Freq: Two times a day (BID) | ORAL | Status: DC

## 2016-03-30 MED ORDER — EPHEDRINE SULFATE-NACL 50-0.9 MG/10ML-% IV SOSY
PREFILLED_SYRINGE | INTRAVENOUS | Status: DC | PRN
  Administered 2016-03-30 (×2): 10 mg via INTRAVENOUS

## 2016-03-30 MED ORDER — BUPIVACAINE HCL (PF) 0.5 % IJ SOLN
INTRAMUSCULAR | Status: AC
  Filled 2016-03-30: qty 30

## 2016-03-30 MED ORDER — LIDOCAINE 2% (20 MG/ML) 5 ML SYRINGE
INTRAMUSCULAR | Status: AC
  Filled 2016-03-30: qty 5

## 2016-03-30 MED ORDER — HYDROCODONE-ACETAMINOPHEN 5-325 MG PO TABS: 1.0000 | ORAL_TABLET | Freq: Four times a day (QID) | ORAL | Status: DC | PRN

## 2016-03-30 MED ORDER — SENNOSIDES-DOCUSATE SODIUM 8.6-50 MG PO TABS: 1.0000 | ORAL_TABLET | Freq: Every evening | ORAL | Status: DC | PRN

## 2016-03-30 MED ORDER — FENTANYL CITRATE (PF) 100 MCG/2ML IJ SOLN
INTRAMUSCULAR | Status: DC | PRN
  Administered 2016-03-30 (×3): 50 ug via INTRAVENOUS

## 2016-03-30 MED ORDER — ZOLPIDEM TARTRATE 5 MG PO TABS: 5.0000 mg | ORAL_TABLET | Freq: Every evening | ORAL | Status: DC | PRN

## 2016-03-30 MED ORDER — HYDROCODONE-ACETAMINOPHEN 5-325 MG PO TABS: 1.0000 | ORAL_TABLET | ORAL | Status: DC | PRN

## 2016-03-30 MED ORDER — BUPIVACAINE HCL (PF) 0.5 % IJ SOLN
INTRAMUSCULAR | Status: DC | PRN
  Administered 2016-03-30: 5 mL
  Administered 2016-03-30: 8.5 mL

## 2016-03-30 MED ORDER — KCL IN DEXTROSE-NACL 20-5-0.45 MEQ/L-%-% IV SOLN: INTRAVENOUS | Status: DC

## 2016-03-30 MED ORDER — 0.9 % SODIUM CHLORIDE (POUR BTL) OPTIME
TOPICAL | Status: DC | PRN
  Administered 2016-03-30: 1000 mL

## 2016-03-30 MED ORDER — PHENOL 1.4 % MT LIQD: 1.0000 | OROMUCOSAL | Status: DC | PRN

## 2016-03-30 MED ORDER — PRAVASTATIN SODIUM 40 MG PO TABS: 40.0000 mg | ORAL_TABLET | Freq: Every day | ORAL | Status: DC

## 2016-03-30 MED ORDER — LACTATED RINGERS IV SOLN
INTRAVENOUS | Status: DC | PRN
  Administered 2016-03-30 (×2): via INTRAVENOUS

## 2016-03-30 MED ORDER — PANTOPRAZOLE SODIUM 40 MG IV SOLR: 40.0000 mg | Freq: Every day | INTRAVENOUS | Status: DC

## 2016-03-30 MED ORDER — CLONAZEPAM 0.125 MG PO TBDP: 0.2500 mg | ORAL_TABLET | Freq: Two times a day (BID) | ORAL | Status: DC | PRN

## 2016-03-30 MED ORDER — HYDROMORPHONE HCL 1 MG/ML IJ SOLN: 0.2500 mg | INTRAMUSCULAR | Status: DC | PRN

## 2016-03-30 MED ORDER — BISACODYL 10 MG RE SUPP: 10.0000 mg | Freq: Every day | RECTAL | Status: DC | PRN

## 2016-03-30 MED ORDER — ONDANSETRON HCL 4 MG/2ML IJ SOLN
INTRAMUSCULAR | Status: AC
  Filled 2016-03-30: qty 2

## 2016-03-30 MED ORDER — CINNAMON 500 MG PO CAPS: 1000.0000 mg | ORAL_CAPSULE | Freq: Every day | ORAL | Status: DC

## 2016-03-30 MED ORDER — OXYCODONE-ACETAMINOPHEN 5-325 MG PO TABS: 1.0000 | ORAL_TABLET | ORAL | Status: DC | PRN

## 2016-03-30 MED ORDER — INSULIN GLARGINE 100 UNIT/ML ~~LOC~~ SOLN
15.0000 [IU] | Freq: Every day | SUBCUTANEOUS | Status: DC
  Filled 2016-03-30: qty 0.15

## 2016-03-30 MED ORDER — ROCURONIUM BROMIDE 10 MG/ML (PF) SYRINGE
PREFILLED_SYRINGE | INTRAVENOUS | Status: DC | PRN
  Administered 2016-03-30: 50 mg via INTRAVENOUS
  Administered 2016-03-30: 30 mg via INTRAVENOUS

## 2016-03-30 MED ORDER — BACITRACIN ZINC 500 UNIT/GM EX OINT
TOPICAL_OINTMENT | CUTANEOUS | Status: AC
  Filled 2016-03-30: qty 28.35

## 2016-03-30 MED ORDER — SUGAMMADEX SODIUM 500 MG/5ML IV SOLN
INTRAVENOUS | Status: AC
  Filled 2016-03-30: qty 5

## 2016-03-30 MED ORDER — MENTHOL 3 MG MT LOZG: 1.0000 | LOZENGE | OROMUCOSAL | Status: DC | PRN

## 2016-03-30 MED ORDER — ONDANSETRON HCL 4 MG/2ML IJ SOLN
INTRAMUSCULAR | Status: DC | PRN
  Administered 2016-03-30: 4 mg via INTRAVENOUS

## 2016-03-30 MED ORDER — LIDOCAINE 2% (20 MG/ML) 5 ML SYRINGE
INTRAMUSCULAR | Status: DC | PRN
  Administered 2016-03-30: 60 mg via INTRAVENOUS

## 2016-03-30 MED ORDER — GABAPENTIN 400 MG PO CAPS: 400.0000 mg | ORAL_CAPSULE | Freq: Three times a day (TID) | ORAL | Status: DC

## 2016-03-30 MED ORDER — SUCCINYLCHOLINE CHLORIDE 200 MG/10ML IV SOSY
PREFILLED_SYRINGE | INTRAVENOUS | Status: DC | PRN
  Administered 2016-03-30: 100 mg via INTRAVENOUS

## 2016-03-30 MED ORDER — VANCOMYCIN HCL 1000 MG IV SOLR
INTRAVENOUS | Status: DC | PRN
  Administered 2016-03-30 (×2): 1000 mg

## 2016-03-30 MED ORDER — ONDANSETRON HCL 4 MG/2ML IJ SOLN: 4.0000 mg | INTRAMUSCULAR | Status: DC | PRN

## 2016-03-30 MED ORDER — CEFAZOLIN IN D5W 1 GM/50ML IV SOLN: 1.0000 g | Freq: Three times a day (TID) | INTRAVENOUS | Status: DC

## 2016-03-30 MED ORDER — PROPOFOL 10 MG/ML IV BOLUS
INTRAVENOUS | Status: AC
Start: 2016-03-30 — End: 2016-03-30
  Filled 2016-03-30: qty 20

## 2016-03-30 MED ORDER — OMEGA-3 FATTY ACIDS 1000 MG PO CAPS: 2.0000 g | ORAL_CAPSULE | Freq: Every day | ORAL | Status: DC

## 2016-03-30 MED ORDER — PHENYLEPHRINE HCL 10 MG/ML IJ SOLN
INTRAVENOUS | Status: DC | PRN
  Administered 2016-03-30: 15 ug/min via INTRAVENOUS

## 2016-03-30 SURGICAL SUPPLY — 44 items
ADH SKN CLS APL DERMABOND .7 (GAUZE/BANDAGES/DRESSINGS) ×2
CANISTER SUCT 3000ML PPV (MISCELLANEOUS) ×3 IMPLANT
COIL EXT DBS STRETCH 40 (Lead) ×4 IMPLANT
DERMABOND ADVANCED (GAUZE/BANDAGES/DRESSINGS) ×4
DERMABOND ADVANCED .7 DNX12 (GAUZE/BANDAGES/DRESSINGS) IMPLANT
DRAPE CAMERA VIDEO/LASER (DRAPES) ×6 IMPLANT
DRAPE INCISE IOBAN 66X45 STRL (DRAPES) ×3 IMPLANT
DRAPE ORTHO SPLIT 77X108 STRL (DRAPES) ×6
DRAPE POUCH INSTRU U-SHP 10X18 (DRAPES) ×6 IMPLANT
DRAPE SURG ORHT 6 SPLT 77X108 (DRAPES) ×4 IMPLANT
DRSG OPSITE 4X5.5 SM (GAUZE/BANDAGES/DRESSINGS) ×4 IMPLANT
DRSG TEGADERM 4X4.75 (GAUZE/BANDAGES/DRESSINGS) ×4 IMPLANT
DURAPREP 26ML APPLICATOR (WOUND CARE) ×6 IMPLANT
ELECT REM PT RETURN 9FT ADLT (ELECTROSURGICAL) ×3
ELECTRODE REM PT RTRN 9FT ADLT (ELECTROSURGICAL) ×1 IMPLANT
GLOVE BIO SURGEON STRL SZ8 (GLOVE) ×6 IMPLANT
GLOVE BIOGEL PI IND STRL 8 (GLOVE) ×1 IMPLANT
GLOVE BIOGEL PI IND STRL 8.5 (GLOVE) ×2 IMPLANT
GLOVE BIOGEL PI INDICATOR 8 (GLOVE) ×4
GLOVE BIOGEL PI INDICATOR 8.5 (GLOVE) ×4
GLOVE ECLIPSE 8.0 STRL XLNG CF (GLOVE) ×6 IMPLANT
GOWN STRL REUS W/ TWL LRG LVL3 (GOWN DISPOSABLE) ×2 IMPLANT
GOWN STRL REUS W/ TWL XL LVL3 (GOWN DISPOSABLE) IMPLANT
GOWN STRL REUS W/TWL 2XL LVL3 (GOWN DISPOSABLE) ×4 IMPLANT
GOWN STRL REUS W/TWL LRG LVL3 (GOWN DISPOSABLE) ×3
GOWN STRL REUS W/TWL XL LVL3 (GOWN DISPOSABLE) ×3
KIT BASIN OR (CUSTOM PROCEDURE TRAY) ×3 IMPLANT
KIT ROOM TURNOVER OR (KITS) ×3 IMPLANT
NDL HYPO 25X1 1.5 SAFETY (NEEDLE) ×2 IMPLANT
NEEDLE HYPO 25X1 1.5 SAFETY (NEEDLE) ×3 IMPLANT
NEUROSTIM OCTOPOLAR ~~LOC~~ 60X55 (Neuro Prosthesis/Implant) ×4 IMPLANT
NS IRRIG 1000ML POUR BTL (IV SOLUTION) ×3 IMPLANT
PACK LAMINECTOMY NEURO (CUSTOM PROCEDURE TRAY) ×5 IMPLANT
PAD ARMBOARD 7.5X6 YLW CONV (MISCELLANEOUS) ×9 IMPLANT
PATIENT PROGRAMMER ×2 IMPLANT
STAPLER SKIN PROX WIDE 3.9 (STAPLE) ×4 IMPLANT
SUT ETHILON 3 0 PS 1 (SUTURE) ×4 IMPLANT
SUT SILK 2 0 TIES 10X30 (SUTURE) ×3 IMPLANT
SUT VIC AB 2-0 CP2 18 (SUTURE) ×6 IMPLANT
SUT VIC AB 3-0 SH 8-18 (SUTURE) ×6 IMPLANT
TOOL TUNNELING (INSTRUMENTS) ×2 IMPLANT
TOWEL OR 17X24 6PK STRL BLUE (TOWEL DISPOSABLE) ×4 IMPLANT
TOWEL OR 17X26 10 PK STRL BLUE (TOWEL DISPOSABLE) ×4 IMPLANT
WATER STERILE IRR 1000ML POUR (IV SOLUTION) ×3 IMPLANT

## 2016-03-30 NOTE — Anesthesia Postprocedure Evaluation (Signed)
Anesthesia Post Note  Patient: John Reese  Procedure(s) Performed: Procedure(s) (LRB): BILATERAL PLACEMENT OF IMPLANTABLE PULSE GENERATOR (Bilateral)  Patient location during evaluation: PACU Anesthesia Type: General Level of consciousness: awake and alert Pain management: pain level controlled Vital Signs Assessment: post-procedure vital signs reviewed and stable Respiratory status: spontaneous breathing, nonlabored ventilation, respiratory function stable and patient connected to nasal cannula oxygen Cardiovascular status: blood pressure returned to baseline and stable Postop Assessment: no signs of nausea or vomiting Anesthetic complications: no    Last Vitals:  Vitals:   03/30/16 1115 03/30/16 1129  BP:    Pulse:  77  Resp: 19 12  Temp:  36.7 C    Last Pain:  Vitals:   03/30/16 0611  TempSrc: Oral  PainSc: 0-No pain                 John Reese,W. EDMOND

## 2016-03-30 NOTE — Brief Op Note (Signed)
03/30/2016  10:15 AM  PATIENT:  John Reese  65 y.o. male  PRE-OPERATIVE DIAGNOSIS:  ESSENTIAL TREMOR, s/p Stage I bilateral VIM DBS placement  POST-OPERATIVE DIAGNOSIS:  ESSENTIAL TREMOR, s/p Stage I bilateral VIM DBS placement  PROCEDURE:  Procedure(s) with comments: BILATERAL PLACEMENT OF IMPLANTABLE PULSE GENERATOR (Bilateral) - BILATERAL PLACEMENT OF IMPLANTABLE PULSE GENERATORS with Extensions  SURGEON:  Surgeon(s) and Role:    * Erline Levine, MD - Primary  PHYSICIAN ASSISTANT:   ASSISTANTS: Poteat, RN   ANESTHESIA:   general  EBL:  Total I/O In: 1500 [I.V.:1500] Out: -   BLOOD ADMINISTERED:none  DRAINS: none   LOCAL MEDICATIONS USED:  MARCAINE    and LIDOCAINE   SPECIMEN:  No Specimen  DISPOSITION OF SPECIMEN:  N/A  COUNTS:  YES  TOURNIQUET:  * No tourniquets in log *  DICTATION: DICTATION: Patient has implanted bilateral VIM stimulator electrodes having recently completed DBS Stage I and now presents for placement of lead extensions and IPG implantation.  PROCEDURE: Patient was brought to the operating room and GETA anesthesia was induced.  Initial operation was performed on the left. Left upper chest, scalp, neck were prepped with betadine scrub and Duraprep.  Area of planned incision was infiltrated with lidocaine.  Scalp incision was made and the lead extensions were exposed. An incision was made in the left upper chest and a pocket was created.  Extension tunnel was made from scalp to pocket.   IPG was placed and attached to lead extension, which in turn was connected to cranial leads and torqued appropriately.  Covering boot was placed.   The IPG  was placed in the pocket. Impedences were checked.  Wounds were irrigated with vancomycin.  Incisions were closed with 2-0 Vicryl and 3-0 vicryl sutures at the pocket and 2-0 vicryl at the scalp with staples. and dressed with a sterile occlusive dressing.  The patient was then repositioned to expose the right side.   Right upper chest, scalp, neck were prepped with betadine scrub and Duraprep.  Area of planned incision was infiltrated with lidocaine.  Scalp incision was made and the lead extensions were exposed. An incision was made in the right upper chest and a pocket was created.  Extension tunnel was made from scalp to pocket.   IPG was placed and attached to lead extension, which in turn was connected to cranial leads and torqued appropriately.  Covering boot was placed.   The IPG  was placed in the pocket. Impedences were checked.  Wounds were irrigated with vancomycin.  Incisions were closed with 2-0 Vicryl and 3-0 vicryl sutures at the pocket and 2-0 vicryl at the scalp with staples. and dressed with a sterile occlusive dressing.Counts were correct at the end of the case.  PLAN OF CARE: Admit to inpatient   PATIENT DISPOSITION:  PACU - hemodynamically stable.   Delay start of Pharmacological VTE agent (>24hrs) due to surgical blood loss or risk of bleeding: yes

## 2016-03-30 NOTE — Progress Notes (Signed)
Patient alert and oriented, mae's well, voiding adequate amount of urine, swallowing without difficulty, no c/o pain at time of discharge. Patient discharged home with family. Script and discharged instructions given to patient. Patient and family stated understanding of instructions given. Patient has an appointment with Dr. Vertell Limber in 10-14 days.

## 2016-03-30 NOTE — Anesthesia Procedure Notes (Signed)
Procedure Name: Intubation Date/Time: 03/30/2016 7:38 AM Performed by: Myna Bright Pre-anesthesia Checklist: Patient identified, Emergency Drugs available, Suction available and Patient being monitored Patient Re-evaluated:Patient Re-evaluated prior to inductionOxygen Delivery Method: Circle system utilized Preoxygenation: Pre-oxygenation with 100% oxygen Intubation Type: IV induction Ventilation: Mask ventilation without difficulty Laryngoscope Size: Glidescope and 3 Grade View: Grade I Tube type: Oral Tube size: 7.5 mm Number of attempts: 1 Airway Equipment and Method: Video-laryngoscopy and Stylet Placement Confirmation: ETT inserted through vocal cords under direct vision,  positive ETCO2 and breath sounds checked- equal and bilateral Secured at: 22 cm Tube secured with: Tape Dental Injury: Teeth and Oropharynx as per pre-operative assessment

## 2016-03-30 NOTE — Op Note (Signed)
03/30/2016  10:15 AM  PATIENT:  John Reese  65 y.o. male  PRE-OPERATIVE DIAGNOSIS:  ESSENTIAL TREMOR, s/p Stage I bilateral VIM DBS placement  POST-OPERATIVE DIAGNOSIS:  ESSENTIAL TREMOR, s/p Stage I bilateral VIM DBS placement  PROCEDURE:  Procedure(s) with comments: BILATERAL PLACEMENT OF IMPLANTABLE PULSE GENERATOR (Bilateral) - BILATERAL PLACEMENT OF IMPLANTABLE PULSE GENERATORS with Extensions  SURGEON:  Surgeon(s) and Role:    * Erline Levine, MD - Primary  PHYSICIAN ASSISTANT:   ASSISTANTS: Poteat, RN   ANESTHESIA:   general  EBL:  Total I/O In: 1500 [I.V.:1500] Out: -   BLOOD ADMINISTERED:none  DRAINS: none   LOCAL MEDICATIONS USED:  MARCAINE    and LIDOCAINE   SPECIMEN:  No Specimen  DISPOSITION OF SPECIMEN:  N/A  COUNTS:  YES  TOURNIQUET:  * No tourniquets in log *  DICTATION: DICTATION: Patient has implanted bilateral VIM stimulator electrodes having recently completed DBS Stage I and now presents for placement of lead extensions and IPG implantation.  PROCEDURE: Patient was brought to the operating room and GETA anesthesia was induced.  Initial operation was performed on the left. Left upper chest, scalp, neck were prepped with betadine scrub and Duraprep.  Area of planned incision was infiltrated with lidocaine.  Scalp incision was made and the lead extensions were exposed. An incision was made in the left upper chest and a pocket was created.  Extension tunnel was made from scalp to pocket.   IPG was placed and attached to lead extension, which in turn was connected to cranial leads and torqued appropriately.  Covering boot was placed.   The IPG  was placed in the pocket. Impedences were checked.  Wounds were irrigated with vancomycin.  Incisions were closed with 2-0 Vicryl and 3-0 vicryl sutures at the pocket and 2-0 vicryl at the scalp with staples. and dressed with a sterile occlusive dressing.  The patient was then repositioned to expose the right side.   Right upper chest, scalp, neck were prepped with betadine scrub and Duraprep.  Area of planned incision was infiltrated with lidocaine.  Scalp incision was made and the lead extensions were exposed. An incision was made in the right upper chest and a pocket was created.  Extension tunnel was made from scalp to pocket.   IPG was placed and attached to lead extension, which in turn was connected to cranial leads and torqued appropriately.  Covering boot was placed.   The IPG  was placed in the pocket. Impedences were checked.  Wounds were irrigated with vancomycin.  Incisions were closed with 2-0 Vicryl and 3-0 vicryl sutures at the pocket and 2-0 vicryl at the scalp with staples. and dressed with a sterile occlusive dressing.Counts were correct at the end of the case.  PLAN OF CARE: Admit to inpatient   PATIENT DISPOSITION:  PACU - hemodynamically stable.   Delay start of Pharmacological VTE agent (>24hrs) due to surgical blood loss or risk of bleeding: yes

## 2016-03-30 NOTE — Interval H&P Note (Signed)
History and Physical Interval Note:  03/30/2016 7:26 AM  John Reese  has presented today for surgery, with the diagnosis of ESSENTIAL TREMOR  The various methods of treatment have been discussed with the patient and family. After consideration of risks, benefits and other options for treatment, the patient has consented to  Procedure(s) with comments: BILATERAL PLACEMENT OF IMPLANTABLE PULSE GENERATOR (Bilateral) - BILATERAL PLACEMENT OF IMPLANTABLE PULSE GENERATOR as a surgical intervention .  The patient's history has been reviewed, patient examined, no change in status, stable for surgery.  I have reviewed the patient's chart and labs.  Questions were answered to the patient's satisfaction.     Kiran Carline D

## 2016-03-30 NOTE — Transfer of Care (Signed)
Immediate Anesthesia Transfer of Care Note  Patient: John Reese  Procedure(s) Performed: Procedure(s) with comments: BILATERAL PLACEMENT OF IMPLANTABLE PULSE GENERATOR (Bilateral) - BILATERAL PLACEMENT OF IMPLANTABLE PULSE GENERATOR  Patient Location: PACU  Anesthesia Type:General  Level of Consciousness: sedated  Airway & Oxygen Therapy: Patient Spontanous Breathing and Patient connected to nasal cannula oxygen  Post-op Assessment: Report given to RN, Post -op Vital signs reviewed and stable and Patient moving all extremities  Post vital signs: Reviewed and stable  Last Vitals:  Vitals:   03/30/16 0611  BP: (!) 147/69  Pulse: 72  Resp: 16  Temp: 36.7 C    Last Pain:  Vitals:   03/30/16 0611  TempSrc: Oral  PainSc: 0-No pain      Patients Stated Pain Goal: 7 ([id number])  Complications: No apparent anesthesia complications

## 2016-03-30 NOTE — Anesthesia Preprocedure Evaluation (Signed)
Anesthesia Evaluation  Patient identified by MRN, date of birth, ID band Patient awake    Reviewed: Allergy & Precautions, NPO status , reviewed documented beta blocker date and time   Airway Mallampati: II  TM Distance: >3 FB     Dental   Pulmonary    breath sounds clear to auscultation       Cardiovascular hypertension,  Rhythm:Regular Rate:Normal     Neuro/Psych  Neuromuscular disease    GI/Hepatic negative GI ROS, Neg liver ROS,   Endo/Other  diabetes  Renal/GU negative Renal ROS     Musculoskeletal   Abdominal   Peds  Hematology   Anesthesia Other Findings   Reproductive/Obstetrics                             Anesthesia Physical Anesthesia Plan  ASA: III  Anesthesia Plan: General   Post-op Pain Management:    Induction: Intravenous  Airway Management Planned: Oral ETT  Additional Equipment:   Intra-op Plan:   Post-operative Plan: Extubation in OR  Informed Consent: I have reviewed the patients History and Physical, chart, labs and discussed the procedure including the risks, benefits and alternatives for the proposed anesthesia with the patient or authorized representative who has indicated his/her understanding and acceptance.   Dental advisory given  Plan Discussed with: CRNA and Anesthesiologist  Anesthesia Plan Comments:         Anesthesia Quick Evaluation

## 2016-03-30 NOTE — H&P (View-Only) (Signed)
Patient ID:   [id number] Patient: John Reese  Date of Birth: 22-Nov-1950 Visit Type: Office Visit   Date: 01/23/2016 11:30 AM Provider: Marchia Meiers. Vertell Limber MD   This 65 year old male presents for Tremor.  History of Present Illness: 1.  Tremor    John Reese, 65 year old retired male visits to discuss deep brain stimulator for essential tremor.  Patient is referred by Dr. Carles Reese.   History: IDDM, essential tremor, trigeminal neuralgia right treated with gabapentin 300 mg twice a day by Dr. Jannifer Reese, left index fingertip amputation Surgical history: Right eardrum surgery 1988, right wisdom tooth two weeks ago  MRI on [facility]        PAST MEDICAL/SURGICAL HISTORY   (Detailed)  Disease/disorder Onset Date Management Date Comments      [facility] 01/23/2016 -  Hearing loss    [facility] 01/23/2016 -  Hyperlipemia    [facility] 01/23/2016 -     PAST MEDICAL HISTORY, SURGICAL HISTORY, FAMILY HISTORY, SOCIAL HISTORY AND REVIEW OF SYSTEMS  01/23/2016, which I have signed.  Family History  (Detailed)   SOCIAL HISTORY  (Detailed) Tobacco use reviewed. Preferred language is Unknown.   Smoking status: Never smoker.  SMOKING STATUS Use Status Type Smoking Status Usage Per Day Years Used Total Pack Years  no/never  Never smoker             MEDICATIONS(added, continued or stopped this visit): Started Medication Directions Instruction Stopped   carbamazepine 200 mg tablet take 1 tablet by oral route  every 12 hours     clonazepam 0.5 mg tablet take 1 tablet by oral route  every day    01/23/2016 gabapentin 300 mg capsule take 1 capsule by oral route 3 times every day     metformin 500 mg tablet take 1 tablet by oral route 2 times every day with morning and evening meals     pravastatin 40 mg tablet take 1 tablet by oral route  every day     propanolol ER 20 ORAL      propanolol ER 20 ORAL      ramipril 2.5 mg capsule take 1 capsule by oral route  every day     Toujeo SoloStar 300 unit/mL (1.5  mL) subcutaneous insulin pen 15 untis daily       ALLERGIES: Ingredient Reaction Medication Name Comment  NO KNOWN ALLERGIES     No known allergies.    Vitals Date Temp F BP Pulse Ht In Wt Lb BMI BSA Pain Score  01/23/2016  140/80 76 73 157.4 20.77  0/10     PHYSICAL EXAM General Level of Distress: no acute distress Overall Appearance: normal  Head and Face  Right Left  Fundoscopic Exam:  normal normal    Cardiovascular Cardiac: regular rate and rhythm without murmur  Right Left  Carotid Pulses: normal normal  Respiratory Lungs: clear to auscultation  Neurological Orientation: normal Recent and Remote Memory: normal Attention Span and Concentration:   normal Language: normal Fund of Knowledge: normal  Right Left Sensation: normal normal Upper Extremity Coordination: normal normal  Lower Extremity Coordination: normal normal  Musculoskeletal Gait and Station: normal  Right Left Upper Extremity Muscle Strength: normal normal Lower Extremity Muscle Strength: normal normal Upper Extremity Muscle Tone:  tremor tremor Lower Extremity Muscle Tone: tremor tremor  Motor Strength Upper and lower extremity motor strength was tested in the clinically pertinent muscles.     Deep Tendon Reflexes  Right Left Biceps: normal normal Triceps: normal normal Brachiloradialis: normal  normal Patellar: normal normal Achilles: normal normal  Cranial Nerves II. Optic Nerve/Visual Fields: normal III. Oculomotor: normal IV. Trochlear: normal V. Trigeminal: normal VI. Abducens: normal VII. Facial: normal VIII. Acoustic/Vestibular: normal IX. Glossopharyngeal: normal X. Vagus: normal XI. Spinal Accessory: normal XII. Hypoglossal: normal  Motor and other Tests Lhermittes: negative Rhomberg: negative Pronator drift: absent     Right Left Hoffman's: normal normal Clonus: normal normal Babinski: normal normal   Additional Findings:  Significant essential  tremor in both arms (greater with postural activity and action), also pronounced at rest.    IMPRESSION The patient has significant essential tremor in both his arms greater than his legs. He has a lot of difficulty with daily activities, such as eating and dressing himself. He would like to proceed with surgery, so we will schedule a deep brain stimulator implantation for alleviation of his tremors.   Schedule DBS implantation. I discussed the details of surgery with the patient and his wife today.     MEDICATIONS PRESCRIBED TODAY    Rx Quantity Refills  GABAPENTIN 300 mg  0 0            Provider:  Marchia Meiers. Vertell Limber MD  01/23/2016 12:23 PM Dictation edited by: [name]    CC Providers: John Reese [address] [place], [place] [id number]              Electronically signed by John Meiers. Vertell Limber MD on 01/23/2016 02:13 PM

## 2016-03-31 NOTE — Discharge Summary (Signed)
Physician Discharge Summary  Patient ID: John Reese MRN: [id number] DOB/AGE: 07/21/1950 65 y.o.  Admit date: 03/30/2016 Discharge date: 03/31/2016  Admission Diagnoses:Tremor  Discharge Diagnoses: Tremor Active Problems:   Essential Tremor   Discharged Condition: good  Hospital Course: Patient underwent Stage II implantation of bilateral Implantable Pulse Generators and extensions after placement of bilateral VIM thalamic DBS leads for essential tremor.  Consults: None  Significant Diagnostic Studies: None  Treatments: surgery: Patient underwent Stage II implantation of bilateral Implantable Pulse Generators and extensions after placement of bilateral VIM thalamic DBS leads for essential tremor  Discharge Exam: Blood pressure (!) 162/80, pulse 78, temperature 97.7 F (36.5 C), resp. rate 16, weight 73.3 kg (161 lb 9.6 oz), SpO2 99 %. Neurologic: Alert and oriented X 3, normal strength and tone. Normal symmetric reflexes. Normal coordination and gait Wound:CDI  Disposition: home     Medication List    ASK your doctor about these medications   carbamazepine 200 MG tablet Commonly known as:  TEGRETOL TAKE 1 AND 1/2 TABLETS IN THE MORNING, 1 TABLET AT MIDDAY AND 1 AND 1/2 TABLETS IN THE EVENING   CINNAMON PO Take 1,000 mg by mouth daily.   clonazePAM 0.25 MG disintegrating tablet Commonly known as:  KLONOPIN DISSOLVE 1 TABLET TWICE A DAY   fish oil-omega-3 fatty acids 1000 MG capsule Take 2 g by mouth daily.   gabapentin 400 MG capsule Commonly known as:  NEURONTIN Take 1 capsule (400 mg total) by mouth 3 (three) times daily.   HYDROcodone-acetaminophen 5-325 MG tablet Commonly known as:  NORCO/VICODIN Take 1 tablet by mouth every 6 (six) hours as needed for moderate pain.   HYDROcodone-acetaminophen 5-325 MG tablet Commonly known as:  NORCO/VICODIN Take 1-2 tablets by mouth every 4 (four) hours as needed (mild pain).   metFORMIN 500 MG tablet Commonly  known as:  GLUCOPHAGE Take 1 tablet (500 mg total) by mouth 2 (two) times daily with a meal.   multivitamin tablet Take 1 tablet by mouth daily.   pravastatin 40 MG tablet Commonly known as:  PRAVACHOL Take 40 mg by mouth daily.   propranolol 40 MG tablet Commonly known as:  INDERAL TAKE 2 TABLETS (80 MG TOTAL) BY MOUTH 2 (TWO) TIMES DAILY.   ramipril 2.5 MG capsule Commonly known as:  ALTACE Take 2.5 mg by mouth daily.   TOUJEO SOLOSTAR 300 UNIT/ML Sopn Generic drug:  Insulin Glargine Inject 15 Units as directed at bedtime.   ULTICARE MINI PEN NEEDLES 31G X 6 MM Misc Generic drug:  Insulin Pen Needle USE AS DIRECTED AT BEDTIME        Signed: Peggyann Shoals, MD 03/31/2016, 5:48 PM

## 2016-04-03 ENCOUNTER — Encounter (HOSPITAL_COMMUNITY): Payer: Self-pay | Admitting: Neurosurgery

## 2016-04-25 NOTE — Progress Notes (Signed)
Subjective:   John Reese was seen in consultation in the movement disorder clinic at the request of [name], NP.  His PCP is [name], MD.  The evaluation is for tremor.  This patient is accompanied in the office by his spouse who supplements the history. The records that were made available to me were reviewed. Wife thinks that he has had tremor for as long as they have been married, for over 30 years.  His father and paternal GF had tremor.   He estimates that he started seeing [name] 10 years ago but I only have records from the recent years.  He has been on klonopin for many years, 0.25mg  bid (never been on higher dosage) and is now on propranolol 80 mg bid as well for tremor.  Pt also has long hx of right trigeminal neuralgia and tried lyrica, 100mg  tid but it caused asterixis (pt states that he changed because of cost more than SE).  He is on tegretol but had hyponatremia with this in the past.  Is on gabapentin 300 mg bid.    Affected by caffeine:  No. (2 cups of coffee/day) Affected by alcohol: (doesn't drink EtOH) Affected by stress:  Yes.   Affected by fatigue:  No. Spills soup if on spoon:  Yes.   Spills glass of liquid if full:  Yes.   Affects ADL's (tying shoes, brushing teeth, etc):  Yes.   (shaves with electric now because of tremor; brushes teeth with both hands; doesn't wear many shoes with ties; wife has to give insulin shots)  12/15/15 update:  Pt f/u today.   He is accompanied by his wife who supplements the hx.   The records that were made available to me were reviewed.  He had neuropsych testing with Dr. Si Raider on 8/2 and f/u with her on 8/17.  There was no evidence of dementia and felt he would be a good DBS candidate.  Pt expresses desire to proceed with next step.  Expresses desire to do bilateral surgery due to severity of tremor.  He is R hand dominant.  03/12/16 update:  Pt f/u accompanied by his wife who supplements the history.  He is  scheduled for fiducial placement tomorrow.  Comes in today for pre-op video and any last minute questions.  He is on gabapentin 400 mg tid, tegretol - 200mg , 1 po tid for right sided TN.  It still is providing him pain on the right.  Also on klonopin 0.25 mg bid for tremor.  On propranolol 40 mg - 2 po bid.    04/26/16 update:  The patient presents, accompanied by his wife and daughter who supplement the history.  The patient underwent bilateral VIM DBS on 03/22/2016.  He had his IPG placed on 03/30/2016.  The patient has now recovered well.  He is back on his clonazepam 0.25 mg twice a day and propranolol 40 mg, 2 tablets twice per day.  He forgot to hold those today.  He has had no falls.  He does have a history of trigeminal neuralgia for which he is on gabapentin, 400 mg 3 times per day and Tegretol 200 mg 3 times per day.  Noticing more "jerking" of the arms since gabapentin dose increased for TN but doesn't think that the increased help.  Current/Previously tried tremor medications: klonopin, propranolol, lyrica (for TN), gabapentin (for TN)  Current medications that may exacerbate tremor:  n/a  Outside reports reviewed: historical medical records, lab reports  and office notes.  Allergies  Allergen Reactions  . No Known Allergies     Outpatient Encounter Prescriptions as of 04/26/2016  Medication Sig  . carbamazepine (TEGRETOL) 200 MG tablet TAKE 1 AND 1/2 TABLETS IN THE MORNING, 1 TABLET AT MIDDAY AND 1 AND 1/2 TABLETS IN THE EVENING (Patient taking differently: 1 tablet twice daily)  . CINNAMON PO Take 1,000 mg by mouth daily.  . clonazePAM (KLONOPIN) 0.25 MG disintegrating tablet DISSOLVE 1 TABLET TWICE A DAY  . fish oil-omega-3 fatty acids 1000 MG capsule Take 2 g by mouth daily.  Marland Kitchen gabapentin (NEURONTIN) 400 MG capsule Take 1 capsule (400 mg total) by mouth 3 (three) times daily.  . metFORMIN (GLUCOPHAGE) 500 MG tablet Take 1 tablet (500 mg total) by mouth 2 (two) times daily with a  meal.  . Multiple Vitamin (MULTIVITAMIN) tablet Take 1 tablet by mouth daily.  . pravastatin (PRAVACHOL) 40 MG tablet Take 40 mg by mouth daily.  . propranolol (INDERAL) 40 MG tablet TAKE 2 TABLETS (80 MG TOTAL) BY MOUTH 2 (TWO) TIMES DAILY.  . ramipril (ALTACE) 2.5 MG capsule Take 2.5 mg by mouth daily.  [name] 300 UNIT/ML SOPN Inject 15 Units as directed at bedtime.  Marland Kitchen ULTICARE MINI PEN NEEDLES 31G X 6 MM MISC USE AS DIRECTED AT BEDTIME  . [DISCONTINUED] HYDROcodone-acetaminophen (NORCO/VICODIN) 5-325 MG tablet Take 1 tablet by mouth every 6 (six) hours as needed for moderate pain.  . [DISCONTINUED] HYDROcodone-acetaminophen (NORCO/VICODIN) 5-325 MG tablet Take 1-2 tablets by mouth every 4 (four) hours as needed (mild pain).   No facility-administered encounter medications on file as of 04/26/2016.     Past Medical History:  Diagnosis Date  . Auditory disturbance    Decreased auditory acuity  . 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 Diabetes ([place])   . Dyslipidemia   . Tremor   . Trigeminal neuralgia    Right, V2 distribution    Past Surgical History:  Procedure Laterality Date  . FINGER AMPUTATION    . INNER EAR SURGERY Right    Ear drum repair  . PULSE GENERATOR IMPLANT Bilateral 03/30/2016   Procedure: BILATERAL PLACEMENT OF IMPLANTABLE PULSE GENERATOR;  Surgeon: Erline Levine, MD;  Location: [facility];  Service: Neurosurgery;  Laterality: Bilateral;  BILATERAL PLACEMENT OF IMPLANTABLE PULSE GENERATOR  . SUBTHALAMIC STIMULATOR INSERTION Bilateral 03/22/2016   Procedure: BILATERAL DEEP BRAIN STIMULATOR PLACEMENT WITH STARFIX WITH DR. Laurelyn Terrero;  Surgeon: Erline Levine, MD;  Location: [facility];  Service: Neurosurgery;  Laterality: Bilateral;    Social History   Social History  . Marital status: Married    Spouse name: N/A  . Number of children: 2  . Years of education: HS   Occupational History  . Works in Theatre manager Other   Social  History Main Topics  . Smoking status: Never Smoker  . Smokeless tobacco: Never Used  . Alcohol use No  . Drug use: No  . Sexual activity: Not on file   Other Topics Concern  . Not on file   Social History Narrative   Patient is right handed.   Patient drinks 2 cups coffee daily.    Family Status  Relation Status  . Father Deceased at age 39  . Sister Alive  . Brother Deceased  . Mother Alive  . Brother Alive  . Brother Alive  . Brother Alive  . Brother Alive  . Sister Alive    Review of Systems  A complete 10 system ROS was obtained and was negative apart from what is mentioned.   Objective:   VITALS:   Vitals:   04/26/16 1302  BP: 140/88  Pulse: 65  Weight: 156 lb (70.8 kg)  Height: 6\' 1"  (1.854 m)   Gen:  Appears stated age and in NAD. HEENT:  Normocephalic.  Head wounds are healing well. The mucous membranes are moist. The superficial temporal arteries are without ropiness or tenderness. Cardiovascular: Regular rate and rhythm. Lungs: Clear to auscultation bilaterally. Neck: There are no carotid bruits noted bilaterally. Skin:  Chest battery sites are well healed, without erythema or drainage  NEUROLOGICAL:  Orientation:  The patient is alert and oriented x 3.   Cranial nerves: There is good facial symmetry.  Speech is fluent and clear. Soft palate rises symmetrically and there is no tongue deviation. Hearing is intact to conversational tone. Tone: Tone is good throughout. Sensation: Sensation is intact to light touch Coordination:  The patient has no dysdiadichokinesia or dysmetria. Motor: Strength is 5/5 in the bilateral upper and lower extremities.  Shoulder shrug is equal bilaterally.  There is no pronator drift.  There are no fasciculations noted. Gait and Station: The patient is able to ambulate without difficulty.   MOVEMENT EXAM: Tremor:  There is no resting tremor even before programming today.  There was tremor in the wing beating position,  worse when given a weight.    DBS was turned on today and described today in a separate programming procedural note.  In brief, there was compete resolution of tremor, although myoclonus was noted.  Able to pour water without spilling it after.  Good archimedes spirals.  Able to write name and never has been per wife.    LABS:    Chemistry      Component Value Date/Time   NA 134 (L) 03/19/2016 0959   NA 138 10/26/2015 1026   K 4.5 03/19/2016 0959   CL 101 03/19/2016 0959   CO2 24 03/19/2016 0959   BUN 19 03/19/2016 0959   BUN 18 10/26/2015 1026   CREATININE 0.99 03/19/2016 0959   CREATININE 1.09 02/17/2016 0802      Component Value Date/Time   CALCIUM 9.4 03/19/2016 0959   ALKPHOS 46 02/17/2016 0802   AST 16 02/17/2016 0802   ALT 19 02/17/2016 0802   BILITOT 0.4 02/17/2016 0802   BILITOT <0.2 10/26/2015 1026     No results found for: VITAMINB12  No results found for: TSH      Assessment/Plan:   1.  Essential Tremor.  -The patient underwent bilateral VIM DBS on 03/22/2016.  He had his IPG placed on 03/30/2016.  The patient has now recovered well. His device was activated today, which is described in more detail on a separate programming procedure note.  Shown how to use patient programmer as was family  - He is back on his clonazepam 0.25 mg twice a day and propranolol 40 mg, 2 tablets twice per day.  Hold those next visit   2.  R trigeminal neuragia  -on gabapentin 400 mg 3 times per day and tegretol 200 mg 3 times per day.  He will continue those medications.  -having some myoclonus and will likely need to drop the gabapentin dosage and may change tegretol to trileptal to see if better control   3.  Diabetic peripheral neuropathy  -talked about safety  4.  F/u [name] as previously scheduled.  Much greater than 50% of this visit  was spent in counseling and coordinating care.  Total face to face time:  25 min which didn't include DBS programming time of 40 min

## 2016-04-26 ENCOUNTER — Ambulatory Visit (INDEPENDENT_AMBULATORY_CARE_PROVIDER_SITE_OTHER): Payer: PPO | Admitting: Neurology

## 2016-04-26 ENCOUNTER — Encounter: Payer: Self-pay | Admitting: Neurology

## 2016-04-26 VITALS — BP 140/88 | HR 65 | Ht 73.0 in | Wt 156.0 lb

## 2016-04-26 DIAGNOSIS — G500 Trigeminal neuralgia: Secondary | ICD-10-CM | POA: Diagnosis not present

## 2016-04-26 DIAGNOSIS — G250 Essential tremor: Secondary | ICD-10-CM | POA: Diagnosis not present

## 2016-04-26 DIAGNOSIS — Z9689 Presence of other specified functional implants: Secondary | ICD-10-CM | POA: Diagnosis not present

## 2016-04-26 DIAGNOSIS — G253 Myoclonus: Secondary | ICD-10-CM

## 2016-04-26 NOTE — Patient Instructions (Signed)
Hold your Propranolol, Clonazepam, and Gabapentin the day of your next appt (05/08/16).

## 2016-04-26 NOTE — Procedures (Signed)
DBS Programming was performed.    Total time spent programming was 45 minutes.  Device was off as was new activation.  Device turned on.  Soft start was confirmed to be on.  Impedences were checked and were within normal limits.  Battery was checked and was determined to be functioning normally and not near the end of life.  Final settings were as follows:  Left brain electrode:     2-1+           ; Amplitude  2.5   V   ; Pulse width 90 microseconds;   Frequency   150   Hz.  Right brain electrode:     2-1+          ; Amplitude   2.5  V ;  Pulse width 90  microseconds;  Frequency   150    Hz.

## 2016-04-27 ENCOUNTER — Other Ambulatory Visit: Payer: Self-pay | Admitting: "Endocrinology

## 2016-05-01 ENCOUNTER — Telehealth: Payer: Self-pay | Admitting: Neurology

## 2016-05-01 NOTE — Telephone Encounter (Signed)
Hold gabapentin (I know his face hurts but it probably is important since we need to test this).  If no better, will change contacts he is on next programming session

## 2016-05-01 NOTE — Telephone Encounter (Signed)
Spoke with patient's wife and she states patient has had trouble with slurring words and stumbling.  She states this dates back to prior to his DBS surgery, but getting worse.  She thinks it is more related to the increase in Gabapentin dosage. He is having more nerve pain in jaw. He took a Gabapentin at dinner on Friday and another at bedtime and she states he was "not right". Last office note states alternatives to gabapentin due to possible causing of myoclonus and wife would like to try a different medication for patient.  Please advise.

## 2016-05-01 NOTE — Telephone Encounter (Signed)
Patient's wife was resistant to this idea but will tell patient to hold gabapentin.

## 2016-05-01 NOTE — Telephone Encounter (Signed)
Patient wife linda called and states patient is having some thing going on and wants to talk to someone please call [phone]

## 2016-05-03 ENCOUNTER — Telehealth: Payer: Self-pay | Admitting: Neurology

## 2016-05-03 MED ORDER — PREGABALIN 75 MG PO CAPS: 75.0000 mg | ORAL_CAPSULE | Freq: Two times a day (BID) | ORAL | 0 refills | Status: DC

## 2016-05-03 NOTE — Telephone Encounter (Signed)
Has he tried lyrica?  Its similar to gabapentin but maybe he won't have the SE.  Start at 75 mg bid.

## 2016-05-03 NOTE — Telephone Encounter (Signed)
Patient's wife called and left a message. Patient has been off Gabapentin for two days and his face pain is so bad he is unable to eat.  I called and spoke with him and he is doing better with no stumbling or slurring his words.  Please advise. [phone].

## 2016-05-03 NOTE — Telephone Encounter (Signed)
Spoke with patient and he has tried Lyrica in the past but it was too expensive. Samples at the front for patient pick up to help until his follow up appt next week.

## 2016-05-07 NOTE — Progress Notes (Signed)
Subjective:   John Reese was seen in consultation in the movement disorder clinic at the request of John Givens, NP.  His PCP is John Mustache, MD.  The evaluation is for tremor.  This patient is accompanied in the office by his spouse who supplements the history. The records that were made available to me were reviewed. Wife thinks that he has had tremor for as long as they have been married, for over 30 years.  His father and paternal GF had tremor.   He estimates that he started seeing Dr. Jannifer Reese 10 years ago but I only have records from the recent years.  He has been on klonopin for many years, 0.25mg  bid (never been on higher dosage) and is now on propranolol 80 mg bid as well for tremor.  Pt also has long hx of right trigeminal neuralgia and tried lyrica, 100mg  tid but it caused asterixis (pt states that he changed because of cost more than SE).  He is on tegretol but had hyponatremia with this in the past.  Is on gabapentin 300 mg bid.    Affected by caffeine:  No. (2 cups of coffee/day) Affected by alcohol: (doesn't drink EtOH) Affected by stress:  Yes.   Affected by fatigue:  No. Spills soup if on spoon:  Yes.   Spills glass of liquid if full:  Yes.   Affects ADL's (tying shoes, brushing teeth, etc):  Yes.   (shaves with electric now because of tremor; brushes teeth with both hands; doesn't wear many shoes with ties; wife has to give insulin shots)  12/15/15 update:  Pt f/u today.   He is accompanied by his wife who supplements the hx.   The records that were made available to me were reviewed.  He had neuropsych testing with Dr. Si Raider on 8/2 and f/u with her on 8/17.  There was no evidence of dementia and felt he would be a good DBS candidate.  Pt expresses desire to proceed with next step.  Expresses desire to do bilateral surgery due to severity of tremor.  He is R hand dominant.  03/12/16 update:  Pt f/u accompanied by his wife who supplements the history.  He is  scheduled for fiducial placement tomorrow.  Comes in today for pre-op video and any last minute questions.  He is on gabapentin 400 mg tid, tegretol - 200mg , 1 po tid for right sided TN.  It still is providing him pain on the right.  Also on klonopin 0.25 mg bid for tremor.  On propranolol 40 mg - 2 po bid.    04/26/16 update:  The patient presents, accompanied by his wife and daughter who supplement the history.  The patient underwent bilateral VIM DBS on 03/22/2016.  He had his IPG placed on 03/30/2016.  The patient has now recovered well.  He is back on his clonazepam 0.25 mg twice a day and propranolol 40 mg, 2 tablets twice per day.  He forgot to hold those today.  He has had no falls.  He does have a history of trigeminal neuralgia for which he is on gabapentin, 400 mg 3 times per day and Tegretol 200 mg 3 times per day.  Noticing more "jerking" of the arms since gabapentin dose increased for TN but doesn't think that the increased help.  05/08/16 update:  The patient presents today for follow-up, accompanied by his wife and granddaughter who supplements the history.  The patient underwent bilateral VIM DBS on 03/22/2016.  His  device was activated on 04/26/2016.  His wife had called me to state that he was having slurring of speech and falls, but she really thought that was more related to the gabapentin.  Pt states that it wasn't falls but near falls.  Hasn't fallen since hospital stay.  Although I was unsure if the changes were really from gabapentin as his wife thought and felt that it certainly could be related to turning on the DBS, I had him hold the gabapentin.  This made the face pain much worse and they called me back not long thereafter.  However, his speech, confusion/forgetfullness and balance change were much better.  I asked about Lyrica, and it turns out he had tried in the past, but not for long because of cost issues.  Records indicated that perhaps it caused asterixis, but the patient stated  that he changed it because of cost.  I gave him some samples of Lyrica just to get him through today's visit to see how he did and he states that he ended up taking some he had at home and he took 100 mg bid.  He also remains on Tegretol, 200 mg 3 times a day for trigeminal neuralgia.   He is happy with tremor control.  Can give himself his own insulin shot and drink his own coffee now.  Sx's of trigeminal neuralgia are the biggest complaint.  Having such severe face pain that he quit eating for 2 days (the 2 days where he stopped the gabapentin and had not started the Lyrica yet).  Current/Previously tried tremor medications: klonopin, propranolol, lyrica (for TN), gabapentin (for TN)  Current medications that may exacerbate tremor:  n/a  Outside reports reviewed: historical medical records, lab reports and office notes.  Allergies  Allergen Reactions  . No Known Allergies     Outpatient Encounter Prescriptions as of 05/08/2016  Medication Sig  . carbamazepine (TEGRETOL) 200 MG tablet TAKE 1 AND 1/2 TABLETS IN THE MORNING, 1 TABLET AT MIDDAY AND 1 AND 1/2 TABLETS IN THE EVENING (Patient taking differently: 1 tablet twice daily)  . CINNAMON PO Take 1,000 mg by mouth daily.  . clonazePAM (KLONOPIN) 0.25 MG disintegrating tablet DISSOLVE 1 TABLET TWICE A DAY  . fish oil-omega-3 fatty acids 1000 MG capsule Take 2 g by mouth daily.  . metFORMIN (GLUCOPHAGE) 500 MG tablet TAKE 1 TABLET (500 MG TOTAL) BY MOUTH 2 (TWO) TIMES DAILY WITH A MEAL.  . Multiple Vitamin (MULTIVITAMIN) tablet Take 1 tablet by mouth daily.  . pravastatin (PRAVACHOL) 40 MG tablet Take 40 mg by mouth daily.  . pregabalin (LYRICA) 75 MG capsule Take 1 capsule (75 mg total) by mouth 2 (two) times daily.  . propranolol (INDERAL) 40 MG tablet TAKE 2 TABLETS (80 MG TOTAL) BY MOUTH 2 (TWO) TIMES DAILY.  . ramipril (ALTACE) 2.5 MG capsule Take 2.5 mg by mouth daily.  [name] 300 UNIT/ML SOPN Inject 15 Units as directed at  bedtime.  Marland Kitchen ULTICARE MINI PEN NEEDLES 31G X 6 MM MISC USE AS DIRECTED AT BEDTIME  . [DISCONTINUED] gabapentin (NEURONTIN) 400 MG capsule Take 1 capsule (400 mg total) by mouth 3 (three) times daily.   No facility-administered encounter medications on file as of 05/08/2016.     Past Medical History:  Diagnosis Date  . Auditory disturbance    Decreased auditory acuity  . 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 Diabetes ([place])   . Dyslipidemia   .  Tremor   . Trigeminal neuralgia    Right, V2 distribution    Past Surgical History:  Procedure Laterality Date  . FINGER AMPUTATION    . INNER EAR SURGERY Right    Ear drum repair  . PULSE GENERATOR IMPLANT Bilateral 03/30/2016   Procedure: BILATERAL PLACEMENT OF IMPLANTABLE PULSE GENERATOR;  Surgeon: Erline Levine, MD;  Location: [facility];  Service: Neurosurgery;  Laterality: Bilateral;  BILATERAL PLACEMENT OF IMPLANTABLE PULSE GENERATOR  . SUBTHALAMIC STIMULATOR INSERTION Bilateral 03/22/2016   Procedure: BILATERAL DEEP BRAIN STIMULATOR PLACEMENT WITH STARFIX WITH DR. [name];  Surgeon: Erline Levine, MD;  Location: [facility];  Service: Neurosurgery;  Laterality: Bilateral;    Social History   Social History  . Marital status: Married    Spouse name: N/A  . Number of children: 2  . Years of education: HS   Occupational History  . Works in Theatre manager Other   Social History Main Topics  . Smoking status: Never Smoker  . Smokeless tobacco: Never Used  . Alcohol use No  . Drug use: No  . Sexual activity: Not on file   Other Topics Concern  . Not on file   Social History Narrative   Patient is right handed.   Patient drinks 2 cups coffee daily.    Family Status  Relation Status  . Father Deceased at age 16  . Sister Alive  . Brother Deceased  . Mother Alive  . Brother Alive  . Brother Alive  . Brother Alive  . Brother Alive  . Sister Alive    Review of Systems A complete 10 system  ROS was obtained and was negative apart from what is mentioned.   Objective:   VITALS:   Vitals:   05/08/16 1329  BP: 140/84  Pulse: 73  Weight: 158 lb (71.7 kg)  Height: 6\' 1"  (1.854 m)   Gen:  Appears stated age and in NAD. HEENT:  Normocephalic.  Head wounds are healing well. The mucous membranes are moist. The superficial temporal arteries are without ropiness or tenderness. Cardiovascular: Regular rate and rhythm. Lungs: Clear to auscultation bilaterally. Neck: There are no carotid bruits noted bilaterally. Skin:  Chest battery sites are well healed, without erythema or drainage  NEUROLOGICAL:  Orientation:  The patient is alert and oriented x 3.   Cranial nerves: There is good facial symmetry.  Speech is fluent and clear. Soft palate rises symmetrically and there is no tongue deviation. Hearing is intact to conversational tone. Tone: Tone is good throughout. Sensation: Sensation is intact to light touch Coordination:  The patient has no dysdiadichokinesia or dysmetria. Motor: Strength is 5/5 in the bilateral upper and lower extremities.  Shoulder shrug is equal bilaterally.  There is no pronator drift.  There are no fasciculations noted. Gait and Station: The patient is able to ambulate without difficulty.   MOVEMENT EXAM: Tremor:  There is no resting tremor even before programming today.  There was no tremor of outstretched hands or with intention.  DBS programming was performed today and described in more detail on a separate programming procedure note.  LABS:    Chemistry      Component Value Date/Time   NA 134 (L) 03/19/2016 0959   NA 138 10/26/2015 1026   K 4.5 03/19/2016 0959   CL 101 03/19/2016 0959   CO2 24 03/19/2016 0959   BUN 19 03/19/2016 0959   BUN 18 10/26/2015 1026   CREATININE 0.99 03/19/2016 0959   CREATININE 1.09 02/17/2016 0802  Component Value Date/Time   CALCIUM 9.4 03/19/2016 0959   ALKPHOS 46 02/17/2016 0802   AST 16 02/17/2016 0802    ALT 19 02/17/2016 0802   BILITOT 0.4 02/17/2016 0802   BILITOT <0.2 10/26/2015 1026     No results found for: VITAMINB12  No results found for: TSH      Assessment/Plan:   1.  Essential Tremor.  -The patient underwent bilateral VIM DBS on 03/22/2016.  He had his IPG placed on 03/30/2016.  The patient has now recovered well. His device was activated today, which is described in more detail on a separate programming procedure note.  Shown how to use patient programmer as was family  - He is back on his clonazepam 0.25 mg twice a day.  Did not change this today as it may be helping the trigeminal neuralgia.  -Decrease propranolol from 80 mg twice a day slowly to 40 mg twice a day.  Asked him to watch his blood pressure to make sure that does not creep up.  -Granddaughter, who works in the cardiac catheter lab, which shown how to use the patient programmer today at the patient's request.  2.  R trigeminal neuragia  -Off of gabapentin.  -take lyrica 75 mg - 2 po bid which may help with diabetic PN as well.  -Consider Botox for this and mild hemifacial spasm.  Talked about the risks, particularly facial asymmetry.  3.  Diabetic peripheral neuropathy  -talked about safety  4.   Much greater than 50% of this visit was spent in counseling and coordinating care.  Total face to face time:  40 min which didn't include DBS programming time of 20 min

## 2016-05-08 ENCOUNTER — Ambulatory Visit (INDEPENDENT_AMBULATORY_CARE_PROVIDER_SITE_OTHER): Payer: PPO | Admitting: Neurology

## 2016-05-08 ENCOUNTER — Encounter: Payer: Self-pay | Admitting: Neurology

## 2016-05-08 VITALS — BP 140/84 | HR 73 | Ht 73.0 in | Wt 158.0 lb

## 2016-05-08 DIAGNOSIS — G250 Essential tremor: Secondary | ICD-10-CM

## 2016-05-08 DIAGNOSIS — G500 Trigeminal neuralgia: Secondary | ICD-10-CM

## 2016-05-08 DIAGNOSIS — Z9689 Presence of other specified functional implants: Secondary | ICD-10-CM | POA: Diagnosis not present

## 2016-05-08 MED ORDER — PREGABALIN 75 MG PO CAPS: 75.0000 mg | ORAL_CAPSULE | Freq: Two times a day (BID) | ORAL | 0 refills | Status: DC

## 2016-05-08 NOTE — Procedures (Signed)
DBS Programming was performed.    Total time spent programming was 20 minutes.  Device was confirmed to be on.  Soft start was confirmed to be on.  Impedences were checked and were within normal limits.  Battery was checked and was determined to be functioning normally and not near the end of life.  Final settings were as follows:  Left brain electrode:     2-1+           ; Amplitude  2.6   V   ; Pulse width 90 microseconds;   Frequency   150   Hz.  Right brain electrode:     2-1+          ; Amplitude   2.1  V ;  Pulse width 90  microseconds;  Frequency   150    Hz.

## 2016-05-08 NOTE — Patient Instructions (Addendum)
1.  Decrease propranolol to 40 mg - 2 in the AM and 1 at night for a week and then decrease to 1 in the AM and 1 at night.  Keep track of your BP at home 2.  Do not change your clonazepam dose for now 3.  Take lyrica - 75 mg - 2 in the AM and 2 at night.  May need to consider botox as well.

## 2016-05-20 ENCOUNTER — Other Ambulatory Visit: Payer: Self-pay | Admitting: Neurology

## 2016-05-29 ENCOUNTER — Telehealth: Payer: Self-pay | Admitting: Neurology

## 2016-05-29 MED ORDER — PREGABALIN 75 MG PO CAPS: 150.0000 mg | ORAL_CAPSULE | Freq: Two times a day (BID) | ORAL | 0 refills | Status: DC

## 2016-05-29 NOTE — Telephone Encounter (Signed)
Patient wife Vaughan Basta needs to talk to someone about medication. [facility] [phone]

## 2016-05-29 NOTE — Telephone Encounter (Signed)
Patient's wife made aware. They will call back to report on speech.   His face pain is doing better. Some days he only has to take Lyrica 75 mg - one tablet at a time instead of two.

## 2016-05-29 NOTE — Telephone Encounter (Signed)
Patient needs refill of Lyrica sent to pharmacy. RF sent.  Wife is still concerned about slow speech and tremors seem a little worse. No other symptoms and he is feeling okay otherwise.  She will keep an eye on him and call if things get worse. He has an appt in 4 weeks to look at DBS.

## 2016-05-29 NOTE — Telephone Encounter (Signed)
I know he will HATE this recommendation but it really will help [place].  I would say to turn off the L side of the DBS for a day and see how speech is and then turn it back on and then do the same thing with the R side for a day.  Then call me and let me know if there is a change with either side off.  Also, how is the face now?

## 2016-06-04 NOTE — Telephone Encounter (Signed)
Called patient's wife. She was very noncommited to answering whether speech was better or if turning off DBS made it better.  She finally decided that speech was a little better when he turned off the right side. She states speech problems just come and go. Speech and walking is consistently much slower.

## 2016-06-04 NOTE — Telephone Encounter (Signed)
Would some physical therapy help?

## 2016-06-04 NOTE — Telephone Encounter (Signed)
Spoke with patient and he states he does not want to start therapy until after he sees Dr. Carles Collet next week.

## 2016-06-04 NOTE — Telephone Encounter (Signed)
He hasn't called to report how he did with trial of each side of DBS off in regards to speech.

## 2016-06-20 ENCOUNTER — Other Ambulatory Visit: Payer: Self-pay | Admitting: "Endocrinology

## 2016-06-20 DIAGNOSIS — Z794 Long term (current) use of insulin: Secondary | ICD-10-CM | POA: Diagnosis not present

## 2016-06-20 DIAGNOSIS — E1165 Type 2 diabetes mellitus with hyperglycemia: Secondary | ICD-10-CM | POA: Diagnosis not present

## 2016-06-20 LAB — HEMOGLOBIN A1C
HEMOGLOBIN A1C: 8.1 % — AB (ref <5.7)
Mean Plasma Glucose: 186 mg/dL

## 2016-06-21 LAB — COMPREHENSIVE METABOLIC PANEL
ALK PHOS: 53 U/L (ref 40–115)
ALT: 14 U/L (ref 9–46)
AST: 12 U/L (ref 10–35)
Albumin: 4 g/dL (ref 3.6–5.1)
BILIRUBIN TOTAL: 0.3 mg/dL (ref 0.2–1.2)
BUN: 17 mg/dL (ref 7–25)
CO2: 25 mmol/L (ref 20–31)
CREATININE: 1.01 mg/dL (ref 0.70–1.25)
Calcium: 9.3 mg/dL (ref 8.6–10.3)
Chloride: 100 mmol/L (ref 98–110)
GLUCOSE: 163 mg/dL — AB (ref 65–99)
Potassium: 5.1 mmol/L (ref 3.5–5.3)
Sodium: 137 mmol/L (ref 135–146)
TOTAL PROTEIN: 6.2 g/dL (ref 6.1–8.1)

## 2016-06-29 ENCOUNTER — Encounter: Payer: Self-pay | Admitting: "Endocrinology

## 2016-06-29 ENCOUNTER — Ambulatory Visit (INDEPENDENT_AMBULATORY_CARE_PROVIDER_SITE_OTHER): Payer: PPO | Admitting: "Endocrinology

## 2016-06-29 VITALS — BP 142/89 | HR 74 | Ht 73.0 in | Wt 162.0 lb

## 2016-06-29 DIAGNOSIS — E782 Mixed hyperlipidemia: Secondary | ICD-10-CM | POA: Diagnosis not present

## 2016-06-29 DIAGNOSIS — IMO0001 Reserved for inherently not codable concepts without codable children: Secondary | ICD-10-CM

## 2016-06-29 DIAGNOSIS — Z794 Long term (current) use of insulin: Secondary | ICD-10-CM | POA: Diagnosis not present

## 2016-06-29 DIAGNOSIS — E1165 Type 2 diabetes mellitus with hyperglycemia: Secondary | ICD-10-CM

## 2016-06-29 DIAGNOSIS — I10 Essential (primary) hypertension: Secondary | ICD-10-CM | POA: Diagnosis not present

## 2016-06-29 MED ORDER — TOUJEO SOLOSTAR 300 UNIT/ML ~~LOC~~ SOPN: 20.0000 [IU] | PEN_INJECTOR | Freq: Every day | SUBCUTANEOUS | 2 refills | Status: DC

## 2016-06-29 NOTE — Progress Notes (Signed)
Subjective:    Patient ID: John Reese, male    DOB: [birth year], PCP John Mustache, MD   Past Medical History:  Diagnosis Date  . Auditory disturbance    Decreased auditory acuity  . 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 Diabetes ([place])   . Dyslipidemia   . Tremor   . Trigeminal neuralgia    Right, V2 distribution   Past Surgical History:  Procedure Laterality Date  . FINGER AMPUTATION    . INNER EAR SURGERY Right    Ear drum repair  . PULSE GENERATOR IMPLANT Bilateral 03/30/2016   Procedure: BILATERAL PLACEMENT OF IMPLANTABLE PULSE GENERATOR;  Surgeon: Erline Levine, MD;  Location: [facility];  Service: Neurosurgery;  Laterality: Bilateral;  BILATERAL PLACEMENT OF IMPLANTABLE PULSE GENERATOR  . SUBTHALAMIC STIMULATOR INSERTION Bilateral 03/22/2016   Procedure: BILATERAL DEEP BRAIN STIMULATOR PLACEMENT WITH STARFIX WITH DR. [name];  Surgeon: Erline Levine, MD;  Location: [facility];  Service: Neurosurgery;  Laterality: Bilateral;   Social History   Social History  . Marital status: Married    Spouse name: N/A  . Number of children: 2  . Years of education: HS   Occupational History  . Works in Theatre manager Other   Social History Main Topics  . Smoking status: Never Smoker  . Smokeless tobacco: Never Used  . Alcohol use No  . Drug use: No  . Sexual activity: Not Asked   Other Topics Concern  . None   Social History Narrative   Patient is right handed.   Patient drinks 2 cups coffee daily.   Outpatient Encounter Prescriptions as of 06/29/2016  Medication Sig  . carbamazepine (TEGRETOL) 200 MG tablet TAKE 1 AND 1/2 TABLETS IN THE MORNING, 1 TABLET AT MIDDAY AND 1 AND 1/2 TABLETS IN THE EVENING (Patient taking differently: 1 tablet twice daily)  . CINNAMON PO Take 1,000 mg by mouth daily.  . clonazePAM (KLONOPIN) 0.25 MG disintegrating tablet DISSOLVE 1 TABLET TWICE DAILY  . fish oil-omega-3 fatty acids 1000 MG capsule Take  2 g by mouth daily.  . metFORMIN (GLUCOPHAGE) 500 MG tablet TAKE 1 TABLET (500 MG TOTAL) BY MOUTH 2 (TWO) TIMES DAILY WITH A MEAL.  . Multiple Vitamin (MULTIVITAMIN) tablet Take 1 tablet by mouth daily.  . pravastatin (PRAVACHOL) 40 MG tablet Take 40 mg by mouth daily.  . pregabalin (LYRICA) 75 MG capsule Take 2 capsules (150 mg total) by mouth 2 (two) times daily.  . propranolol (INDERAL) 40 MG tablet TAKE 2 TABLETS (80 MG TOTAL) BY MOUTH 2 (TWO) TIMES DAILY. (Patient taking differently: TAKE 1 TABLETS (80 MG TOTAL) BY MOUTH 2 (TWO) TIMES DAILY.)  . ramipril (ALTACE) 2.5 MG capsule Take 2.5 mg by mouth daily.  [name] 300 UNIT/ML SOPN Inject 20 Units into the skin at bedtime.  Marland Kitchen ULTICARE MINI PEN NEEDLES 31G X 6 MM MISC USE AS DIRECTED AT BEDTIME  . [DISCONTINUED] TOUJEO SOLOSTAR 300 UNIT/ML SOPN Inject 15 Units as directed at bedtime.   No facility-administered encounter medications on file as of 06/29/2016.    ALLERGIES: Allergies  Allergen Reactions  . No Known Allergies    VACCINATION STATUS:  There is no immunization history on file for this patient.  Diabetes  He presents for his follow-up diabetic visit. He has type 2 diabetes mellitus. Onset time: He was diagnosed at approximate age of 38 years. His disease course has been worsening (He is diagnosed with essential tremors  since last visit, following with neurology.). There are no hypoglycemic associated symptoms. Pertinent negatives for hypoglycemia include no confusion, headaches, pallor or seizures. There are no diabetic associated symptoms. Pertinent negatives for diabetes include no chest pain, no fatigue, no polydipsia, no polyphagia, no polyuria and no weakness. There are no hypoglycemic complications. Symptoms are worsening. There are no diabetic complications. Risk factors for coronary artery disease include diabetes mellitus, dyslipidemia, hypertension, male sex and sedentary lifestyle. Current diabetic treatment  includes insulin injections and oral agent (monotherapy). He is compliant with treatment most of the time. His weight is increasing steadily. He is following a generally healthy diet. When asked about meal planning, he reported none. He has had a previous visit with a dietitian. He participates in exercise intermittently. His home blood glucose trend is increasing steadily. (He did not bring his meter nor logs to review today. However he is A1c is higher at 8.1%) An ACE inhibitor/angiotensin II receptor blocker is being taken.  Hyperlipidemia  This is a chronic problem. The current episode started more than 1 year ago. Pertinent negatives include no chest pain, myalgias or shortness of breath. Risk factors for coronary artery disease include diabetes mellitus, dyslipidemia, hypertension, male sex and a sedentary lifestyle.  Hypertension  This is a chronic problem. The current episode started more than 1 year ago. Pertinent negatives include no chest pain, headaches, neck pain, palpitations or shortness of breath. Risk factors for coronary artery disease include dyslipidemia and diabetes mellitus. Past treatments include ACE inhibitors.     Review of Systems  Constitutional: Negative for fatigue and unexpected weight change.  HENT: Negative for dental problem, mouth sores and trouble swallowing.   Eyes: Negative for visual disturbance.  Respiratory: Negative for cough, choking, chest tightness, shortness of breath and wheezing.   Cardiovascular: Negative for chest pain, palpitations and leg swelling.  Gastrointestinal: Negative for abdominal distention, abdominal pain, constipation, diarrhea, nausea and vomiting.  Endocrine: Negative for polydipsia, polyphagia and polyuria.  Genitourinary: Negative for dysuria, flank pain, hematuria and urgency.  Musculoskeletal: Negative for back pain, gait problem, myalgias and neck pain.  Skin: Negative for pallor, rash and wound.  Neurological: Negative for  seizures, syncope, weakness, numbness and headaches.  Psychiatric/Behavioral: Negative.  Negative for confusion and dysphoric mood.    Objective:    BP (!) 142/89   Pulse 74   Ht 6\' 1"  (1.854 m)   Wt 162 lb (73.5 kg)   BMI 21.37 kg/m   Wt Readings from Last 3 Encounters:  06/29/16 162 lb (73.5 kg)  05/08/16 158 lb (71.7 kg)  04/26/16 156 lb (70.8 kg)    Physical Exam  Constitutional: He is oriented to person, place, and time. He appears well-developed and well-nourished. He is cooperative. No distress.  HENT:  Head: Normocephalic and atraumatic.  Eyes: EOM are normal.  Neck: Normal range of motion. Neck supple. No tracheal deviation present. No thyromegaly present.  Cardiovascular: Normal rate, S1 normal, S2 normal and normal heart sounds.  Exam reveals no gallop.   No murmur heard. Pulses:      Dorsalis pedis pulses are 2+ on the right side, and 2+ on the left side.       Posterior tibial pulses are 2+ on the right side, and 2+ on the left side.  Pulmonary/Chest: Breath sounds normal. No respiratory distress. He has no wheezes.  Abdominal: Soft. Bowel sounds are normal. He exhibits no distension. There is no tenderness. There is no guarding and no CVA tenderness.  Musculoskeletal: He exhibits no edema.       Right shoulder: He exhibits no swelling and no deformity.  Neurological: He is alert and oriented to person, place, and time. He has normal strength and normal reflexes. No cranial nerve deficit or sensory deficit. Coordination abnormal. Gait normal.  He has gross trembling of  his bilateral upper extremities, + tremor at rest.   Skin: Skin is warm and dry. No rash noted. No cyanosis. Nails show no clubbing.  Psychiatric: He has a normal mood and affect. His speech is normal and behavior is normal. Judgment and thought content normal. Cognition and memory are normal.    Results for orders placed or performed in visit on 06/20/16  Comprehensive metabolic panel  Result  Value Ref Range   Sodium 137 135 - 146 mmol/L   Potassium 5.1 3.5 - 5.3 mmol/L   Chloride 100 98 - 110 mmol/L   CO2 25 20 - 31 mmol/L   Glucose, Bld 163 (H) 65 - 99 mg/dL   BUN 17 7 - 25 mg/dL   Creat 1.01 0.70 - 1.25 mg/dL   Total Bilirubin 0.3 0.2 - 1.2 mg/dL   Alkaline Phosphatase 53 40 - 115 U/L   AST 12 10 - 35 U/L   ALT 14 9 - 46 U/L   Total Protein 6.2 6.1 - 8.1 g/dL   Albumin 4.0 3.6 - 5.1 g/dL   Calcium 9.3 8.6 - 10.3 mg/dL  Hemoglobin A1c  Result Value Ref Range   Hgb A1c MFr Bld 8.1 (H) <5.7 %   Mean Plasma Glucose 186 mg/dL   Diabetic Labs (most recent): Lab Results  Component Value Date   HGBA1C 8.1 (H) 06/20/2016   HGBA1C 6.5 (H) 02/17/2016   HGBA1C 7.1 09/30/2015     Assessment & Plan:   1. Uncontrolled type 2 diabetes mellitus without complication, with long-term current use of insulin (HCC) -No gross complications so far, however patient remains at a high risk for more acute and chronic complications of diabetes which include CAD, CVA, CKD, retinopathy, and neuropathy. These are all discussed in detail with the patient.  Patient came with Out his meter nor logs, however Increasing A1c of 8.1% from 6.5%. - His A1c has generally improving from 10.5 %.  Recent labs reviewed.   - I have re-counseled the patient on diet management   by adopting a carbohydrate restricted / protein rich  Diet.  - Suggestion is made for patient to avoid simple carbohydrates   from their diet including Cakes , Desserts, Ice Cream,  Soda (  diet and regular) , Sweet Tea , Candies,  Chips, Cookies, Artificial Sweeteners,   and "Sugar-free" Products .  This will help patient to have stable blood glucose profile and potentially avoid unintended  Weight gain.  - Patient is advised to stick to a routine mealtimes to eat 3 meals  a day and avoid unnecessary snacks ( to snack only to correct hypoglycemia).  - The patient  has been  scheduled with Jearld Fenton, RDN, CDE for individualized  DM education.  - I have approached patient with the following individualized plan to manage diabetes and patient agrees.  -  I will increase his  Toujeo to 20 units daily at bedtime , associated with monitoring of blood glucose 4 times a day-3 times a day before meals and at bedtime .  -I will continue his metformin  500 mg by mouth twice a day.  - his c-peptide is critically low  at 0.5, indicating pancreatic exhaustion.  - It is only a matter of time before he will require prandial insulin in addition to his basal insulin. - His GAD antibodies are negative, however does not rule out a possibility of LADA.  He is not a candidate for incretin therapy.  - Patient specific target  for A1c; LDL, HDL, Triglycerides, and  Waist Circumference were discussed in detail.  2) BP/HTN:  controlled. Continue current medications including ACEI/ARB. 3) Lipids/HPL:  continue statins. 4)  Weight/Diet: CDE consult in progress, exercise, and carbohydrates information provided.  5) Chronic Care/Health Maintenance:  -Patient is on ACEI/ARB and Statin medications and encouraged to continue to follow up with Ophthalmology, Podiatrist at least yearly or according to recommendations, and advised to  stay away from smoking. I have recommended yearly flu vaccine and pneumonia vaccination at least every 5 years; moderate intensity exercise for up to 150 minutes weekly; and  sleep for at least 7 hours a day. - Since his last visit, he was diagnosed with essential tremors. I advised him to continue follow-up with neurology. - 25 minutes of time was spent on the care of this patient , 50% of which was applied for counseling on diabetes complications and their preventions.  - I advised patient to maintain close follow up with John Mustache, MD for primary care needs.  Patient is asked to bring meter and  blood glucose logs during their next visit.   Follow up plan: -Return in about 1 week (around 07/06/2016) for  follow up with meter and logs- no labs.  Glade Fielding, MD Phone: [phone]  Fax: [phone]   06/29/2016, 12:01 PM

## 2016-07-01 ENCOUNTER — Other Ambulatory Visit: Payer: Self-pay | Admitting: "Endocrinology

## 2016-07-02 DIAGNOSIS — E1169 Type 2 diabetes mellitus with other specified complication: Secondary | ICD-10-CM | POA: Diagnosis not present

## 2016-07-02 DIAGNOSIS — E785 Hyperlipidemia, unspecified: Secondary | ICD-10-CM | POA: Diagnosis not present

## 2016-07-02 DIAGNOSIS — Z23 Encounter for immunization: Secondary | ICD-10-CM | POA: Diagnosis not present

## 2016-07-02 DIAGNOSIS — I10 Essential (primary) hypertension: Secondary | ICD-10-CM | POA: Diagnosis not present

## 2016-07-03 NOTE — Progress Notes (Signed)
Subjective:   John Reese was seen in consultation in the movement disorder clinic at the request of [name], NP.  His PCP is [name], MD.  The evaluation is for tremor.  This patient is accompanied in the office by his spouse who supplements the history. The records that were made available to me were reviewed. Wife thinks that he has had tremor for as long as they have been married, for over 30 years.  His father and paternal GF had tremor.   He estimates that he started seeing [name] 10 years ago but I only have records from the recent years.  He has been on klonopin for many years, 0.25mg  bid (never been on higher dosage) and is now on propranolol 80 mg bid as well for tremor.  Pt also has long hx of right trigeminal neuralgia and tried lyrica, 100mg  tid but it caused asterixis (pt states that he changed because of cost more than SE).  He is on tegretol but had hyponatremia with this in the past.  Is on gabapentin 300 mg bid.    Affected by caffeine:  No. (2 cups of coffee/day) Affected by alcohol: (doesn't drink EtOH) Affected by stress:  Yes.   Affected by fatigue:  No. Spills soup if on spoon:  Yes.   Spills glass of liquid if full:  Yes.   Affects ADL's (tying shoes, brushing teeth, etc):  Yes.   (shaves with electric now because of tremor; brushes teeth with both hands; doesn't wear many shoes with ties; wife has to give insulin shots)  12/15/15 update:  Pt f/u today.   He is accompanied by his wife who supplements the hx.   The records that were made available to me were reviewed.  He had neuropsych testing with Dr. Si Raider on 8/2 and f/u with her on 8/17.  There was no evidence of dementia and felt he would be a good DBS candidate.  Pt expresses desire to proceed with next step.  Expresses desire to do bilateral surgery due to severity of tremor.  He is R hand dominant.  03/12/16 update:  Pt f/u accompanied by his wife who supplements the history.  He is  scheduled for fiducial placement tomorrow.  Comes in today for pre-op video and any last minute questions.  He is on gabapentin 400 mg tid, tegretol - 200mg , 1 po tid for right sided TN.  It still is providing him pain on the right.  Also on klonopin 0.25 mg bid for tremor.  On propranolol 40 mg - 2 po bid.    04/26/16 update:  The patient presents, accompanied by his wife and daughter who supplement the history.  The patient underwent bilateral VIM DBS on 03/22/2016.  He had his IPG placed on 03/30/2016.  The patient has now recovered well.  He is back on his clonazepam 0.25 mg twice a day and propranolol 40 mg, 2 tablets twice per day.  He forgot to hold those today.  He has had no falls.  He does have a history of trigeminal neuralgia for which he is on gabapentin, 400 mg 3 times per day and Tegretol 200 mg 3 times per day.  Noticing more "jerking" of the arms since gabapentin dose increased for TN but doesn't think that the increased help.  05/08/16 update:  The patient presents today for follow-up, accompanied by his wife and granddaughter who supplements the history.  The patient underwent bilateral VIM DBS on 03/22/2016.  His  device was activated on 04/26/2016.  His wife had called me to state that he was having slurring of speech and falls, but she really thought that was more related to the gabapentin.  Pt states that it wasn't falls but near falls.  Hasn't fallen since hospital stay.  Although I was unsure if the changes were really from gabapentin as his wife thought and felt that it certainly could be related to turning on the DBS, I had him hold the gabapentin.  This made the face pain much worse and they called me back not long thereafter.  However, his speech, confusion/forgetfullness and balance change were much better.  I asked about Lyrica, and it turns out he had tried in the past, but not for long because of cost issues.  Records indicated that perhaps it caused asterixis, but the patient stated  that he changed it because of cost.  I gave him some samples of Lyrica just to get him through today's visit to see how he did and he states that he ended up taking some he had at home and he took 100 mg bid.  He also remains on Tegretol, 200 mg 3 times a day for trigeminal neuralgia.   He is happy with tremor control.  Can give himself his own insulin shot and drink his own coffee now.  Sx's of trigeminal neuralgia are the biggest complaint.  Having such severe face pain that he quit eating for 2 days (the 2 days where he stopped the gabapentin and had not started the Lyrica yet).  07/05/16 update:  Patient returns today, accompanied by his wife who supplements the history.  The patient underwent bilateral VIM DBS on 03/22/2016 with device activation on 04/26/2016.  Last visit, they told me that they thought his speech change was from gabapentin, so we have discontinued that for his trigeminal neuralgia and changed him to Lyrica.  He is currently on Lyrica, 75 mg, 2 po bid. That is working well for the face pain.  He describes occasional myoclonus.   They did call me since last visit and stated that his speech and walking really have been slower.  His wife states that it is intermittent and it is really good today.  I asked them to turn off each site independently for a day, and they did that but noted no change in speech with that.  Wife states that speech better over last few days.  I did ask the patient to drop his propranolol last visit from 80 mg twice per day to 40 mg twice per day.  He would like to stop that and his klonopin.    Current/Previously tried tremor medications: klonopin, propranolol, lyrica (for TN), gabapentin (for TN)  Current medications that may exacerbate tremor:  n/a  Outside reports reviewed: historical medical records, lab reports and office notes.  Allergies  Allergen Reactions  . No Known Allergies     Outpatient Encounter Prescriptions as of 07/05/2016  Medication Sig    . carbamazepine (TEGRETOL) 200 MG tablet TAKE 1 AND 1/2 TABLETS IN THE MORNING, 1 TABLET AT MIDDAY AND 1 AND 1/2 TABLETS IN THE EVENING (Patient taking differently: 1 tablet twice daily)  . CINNAMON PO Take 1,000 mg by mouth daily.  . clonazePAM (KLONOPIN) 0.25 MG disintegrating tablet DISSOLVE 1 TABLET TWICE DAILY  . fish oil-omega-3 fatty acids 1000 MG capsule Take 2 g by mouth daily.  . metFORMIN (GLUCOPHAGE) 500 MG tablet TAKE 1 TABLET (500 MG TOTAL) BY MOUTH  2 (TWO) TIMES DAILY WITH A MEAL.  . Multiple Vitamin (MULTIVITAMIN) tablet Take 1 tablet by mouth daily.  . pravastatin (PRAVACHOL) 40 MG tablet Take 40 mg by mouth daily.  . pregabalin (LYRICA) 75 MG capsule Take 2 capsules (150 mg total) by mouth 2 (two) times daily.  . propranolol (INDERAL) 40 MG tablet TAKE 2 TABLETS (80 MG TOTAL) BY MOUTH 2 (TWO) TIMES DAILY. (Patient taking differently: TAKE 1 TABLETS (80 MG TOTAL) BY MOUTH 2 (TWO) TIMES DAILY.)  . ramipril (ALTACE) 2.5 MG capsule Take 2.5 mg by mouth daily.  [name] 300 UNIT/ML SOPN Inject 20 Units into the skin at bedtime.  Marland Kitchen ULTICARE MINI PEN NEEDLES 31G X 6 MM MISC USE AS DIRECTED AT BEDTIME (Patient not taking: Reported on 07/05/2016)   No facility-administered encounter medications on file as of 07/05/2016.     Past Medical History:  Diagnosis Date  . Auditory disturbance    Decreased auditory acuity  . 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 Diabetes ([place])   . Dyslipidemia   . Tremor   . Trigeminal neuralgia    Right, V2 distribution    Past Surgical History:  Procedure Laterality Date  . FINGER AMPUTATION    . INNER EAR SURGERY Right    Ear drum repair  . PULSE GENERATOR IMPLANT Bilateral 03/30/2016   Procedure: BILATERAL PLACEMENT OF IMPLANTABLE PULSE GENERATOR;  Surgeon: Erline Levine, MD;  Location: [facility];  Service: Neurosurgery;  Laterality: Bilateral;  BILATERAL PLACEMENT OF IMPLANTABLE PULSE GENERATOR  .  SUBTHALAMIC STIMULATOR INSERTION Bilateral 03/22/2016   Procedure: BILATERAL DEEP BRAIN STIMULATOR PLACEMENT WITH STARFIX WITH DR. Mykiah Schmuck;  Surgeon: Erline Levine, MD;  Location: [facility];  Service: Neurosurgery;  Laterality: Bilateral;    Social History   Social History  . Marital status: Married    Spouse name: N/A  . Number of children: 2  . Years of education: HS   Occupational History  . Works in Theatre manager Other   Social History Main Topics  . Smoking status: Never Smoker  . Smokeless tobacco: Never Used  . Alcohol use No  . Drug use: No  . Sexual activity: Not on file   Other Topics Concern  . Not on file   Social History Narrative   Patient is right handed.   Patient drinks 2 cups coffee daily.    Family Status  Relation Status  . Father Deceased at age 69  . Sister Alive  . Brother Deceased  . Mother Alive  . Brother Alive  . Brother Alive  . Brother Alive  . Brother Alive  . Sister Alive    Review of Systems A complete 10 system ROS was obtained and was negative apart from what is mentioned.   Objective:   VITALS:   Vitals:   07/05/16 1315  BP: 126/76  Pulse: 64  Weight: 161 lb (73 kg)  Height: 6\' 1"  (1.854 m)   Gen:  Appears stated age and in NAD. HEENT:  Normocephalic.  Head wounds are healing well. The mucous membranes are moist. The superficial temporal arteries are without ropiness or tenderness. Cardiovascular: Regular rate and rhythm. Lungs: Clear to auscultation bilaterally. Neck: There are no carotid bruits noted bilaterally.  NEUROLOGICAL:  Orientation:  The patient is alert and oriented x 3.   Cranial nerves: There is good facial symmetry.  Speech is fluent and clear. Soft palate rises symmetrically and there is no tongue deviation. Hearing  is intact to conversational tone. Tone: Tone is good throughout. Sensation: Sensation is intact to light touch Coordination:  The patient has no dysdiadichokinesia or dysmetria. Motor: Strength is  5/5 in the bilateral upper and lower extremities.  Shoulder shrug is equal bilaterally.  There is no pronator drift.  There are no fasciculations noted. Gait and Station: The patient is able to ambulate without difficulty.   MOVEMENT EXAM: Tremor:  There is no resting tremor even before programming today.  There was no tremor of outstretched hands or with intention.  DBS programming was performed today and described in more detail on a separate programming procedure note.  I did change contacts due to c/o speech change.  LABS:    Chemistry      Component Value Date/Time   NA 137 06/20/2016 0803   NA 138 10/26/2015 1026   K 5.1 06/20/2016 0803   CL 100 06/20/2016 0803   CO2 25 06/20/2016 0803   BUN 17 06/20/2016 0803   BUN 18 10/26/2015 1026   CREATININE 1.01 06/20/2016 0803      Component Value Date/Time   CALCIUM 9.3 06/20/2016 0803   ALKPHOS 53 06/20/2016 0803   AST 12 06/20/2016 0803   ALT 14 06/20/2016 0803   BILITOT 0.3 06/20/2016 0803   BILITOT <0.2 10/26/2015 1026     No results found for: VITAMINB12  No results found for: TSH      Assessment/Plan:   1.  Essential Tremor.  -The patient underwent bilateral VIM DBS on 03/22/2016.  He had his IPG placed on 03/30/2016.  The patient has had some trouble with his speech, which certainly could be a side effect from DBS therapy.  I had wanted them to turn off each side independently for a day or so to see which side was affecting speech, but he noted no change with that.  Regardless, we changed contacts today and still had good tremor control and he will keep an eye on speech    -wean off klonopin  -Decrease propranolol from 40 mg bid to 40 mg q day for a month and then d/c.  Watch BP  -post op DBS video done today  2.  R trigeminal neuragia  -Off of gabapentin.  -take lyrica 75 mg - 2 po bid which may help with diabetic PN as well.  This is working well.  Describing intermittent myoclonus which is likely from this.  May  wean this dose if TN well controlled.  Samples provided.  -Consider Botox for this and mild hemifacial spasm.  Talked about the risks, particularly facial asymmetry.  3.  Diabetic peripheral neuropathy  -talked about safety  4.   Much greater than 50% of this visit was spent in counseling and coordinating care.  Total face to face time:  30 min which didn't include DBS programming time of 30 min

## 2016-07-04 DIAGNOSIS — E784 Other hyperlipidemia: Secondary | ICD-10-CM | POA: Diagnosis not present

## 2016-07-04 DIAGNOSIS — R799 Abnormal finding of blood chemistry, unspecified: Secondary | ICD-10-CM | POA: Diagnosis not present

## 2016-07-05 ENCOUNTER — Ambulatory Visit (INDEPENDENT_AMBULATORY_CARE_PROVIDER_SITE_OTHER): Payer: PPO | Admitting: "Endocrinology

## 2016-07-05 ENCOUNTER — Encounter: Payer: Self-pay | Admitting: Neurology

## 2016-07-05 ENCOUNTER — Encounter: Payer: Self-pay | Admitting: "Endocrinology

## 2016-07-05 ENCOUNTER — Ambulatory Visit (INDEPENDENT_AMBULATORY_CARE_PROVIDER_SITE_OTHER): Payer: PPO | Admitting: Neurology

## 2016-07-05 VITALS — BP 138/81 | HR 68 | Ht 73.0 in | Wt 162.0 lb

## 2016-07-05 VITALS — BP 126/76 | HR 64 | Ht 73.0 in | Wt 161.0 lb

## 2016-07-05 DIAGNOSIS — Z794 Long term (current) use of insulin: Secondary | ICD-10-CM | POA: Diagnosis not present

## 2016-07-05 DIAGNOSIS — G250 Essential tremor: Secondary | ICD-10-CM | POA: Diagnosis not present

## 2016-07-05 DIAGNOSIS — G500 Trigeminal neuralgia: Secondary | ICD-10-CM | POA: Diagnosis not present

## 2016-07-05 DIAGNOSIS — I10 Essential (primary) hypertension: Secondary | ICD-10-CM

## 2016-07-05 DIAGNOSIS — IMO0001 Reserved for inherently not codable concepts without codable children: Secondary | ICD-10-CM

## 2016-07-05 DIAGNOSIS — E1165 Type 2 diabetes mellitus with hyperglycemia: Secondary | ICD-10-CM | POA: Diagnosis not present

## 2016-07-05 DIAGNOSIS — G253 Myoclonus: Secondary | ICD-10-CM | POA: Diagnosis not present

## 2016-07-05 DIAGNOSIS — E782 Mixed hyperlipidemia: Secondary | ICD-10-CM

## 2016-07-05 DIAGNOSIS — Z9689 Presence of other specified functional implants: Secondary | ICD-10-CM | POA: Diagnosis not present

## 2016-07-05 MED ORDER — PREGABALIN 75 MG PO CAPS: 150.0000 mg | ORAL_CAPSULE | Freq: Two times a day (BID) | ORAL | 0 refills | Status: DC

## 2016-07-05 NOTE — Progress Notes (Signed)
Subjective:    Patient ID: John Reese, male    DOB: 01/19/1951, PCP [name], MD   Past Medical History:  Diagnosis Date  . Auditory disturbance    Decreased auditory acuity  . 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 Diabetes ([place])   . Dyslipidemia   . Tremor   . Trigeminal neuralgia    Right, V2 distribution   Past Surgical History:  Procedure Laterality Date  . FINGER AMPUTATION    . INNER EAR SURGERY Right    Ear drum repair  . PULSE GENERATOR IMPLANT Bilateral 03/30/2016   Procedure: BILATERAL PLACEMENT OF IMPLANTABLE PULSE GENERATOR;  Surgeon: Erline Levine, MD;  Location: [facility];  Service: Neurosurgery;  Laterality: Bilateral;  BILATERAL PLACEMENT OF IMPLANTABLE PULSE GENERATOR  . SUBTHALAMIC STIMULATOR INSERTION Bilateral 03/22/2016   Procedure: BILATERAL DEEP BRAIN STIMULATOR PLACEMENT WITH STARFIX WITH DR. [name];  Surgeon: Erline Levine, MD;  Location: [facility];  Service: Neurosurgery;  Laterality: Bilateral;   Social History   Social History  . Marital status: Married    Spouse name: N/A  . Number of children: 2  . Years of education: HS   Occupational History  . Works in Theatre manager Other   Social History Main Topics  . Smoking status: Never Smoker  . Smokeless tobacco: Never Used  . Alcohol use No  . Drug use: No  . Sexual activity: Not Asked   Other Topics Concern  . None   Social History Narrative   Patient is right handed.   Patient drinks 2 cups coffee daily.   Outpatient Encounter Prescriptions as of 07/05/2016  Medication Sig  . carbamazepine (TEGRETOL) 200 MG tablet TAKE 1 AND 1/2 TABLETS IN THE MORNING, 1 TABLET AT MIDDAY AND 1 AND 1/2 TABLETS IN THE EVENING (Patient taking differently: 1 tablet twice daily)  . CINNAMON PO Take 1,000 mg by mouth daily.  . clonazePAM (KLONOPIN) 0.25 MG disintegrating tablet DISSOLVE 1 TABLET TWICE DAILY  . fish oil-omega-3 fatty acids 1000 MG capsule Take  2 g by mouth daily.  . metFORMIN (GLUCOPHAGE) 500 MG tablet TAKE 1 TABLET (500 MG TOTAL) BY MOUTH 2 (TWO) TIMES DAILY WITH A MEAL.  . Multiple Vitamin (MULTIVITAMIN) tablet Take 1 tablet by mouth daily.  . pravastatin (PRAVACHOL) 40 MG tablet Take 40 mg by mouth daily.  . pregabalin (LYRICA) 75 MG capsule Take 2 capsules (150 mg total) by mouth 2 (two) times daily.  . propranolol (INDERAL) 40 MG tablet TAKE 2 TABLETS (80 MG TOTAL) BY MOUTH 2 (TWO) TIMES DAILY. (Patient taking differently: TAKE 1 TABLETS (80 MG TOTAL) BY MOUTH 2 (TWO) TIMES DAILY.)  . ramipril (ALTACE) 2.5 MG capsule Take 2.5 mg by mouth daily.  [name] 300 UNIT/ML SOPN Inject 20 Units into the skin at bedtime.  Marland Kitchen ULTICARE MINI PEN NEEDLES 31G X 6 MM MISC USE AS DIRECTED AT BEDTIME   No facility-administered encounter medications on file as of 07/05/2016.    ALLERGIES: Allergies  Allergen Reactions  . No Known Allergies    VACCINATION STATUS:  There is no immunization history on file for this patient.  Diabetes  He presents for his follow-up diabetic visit. He has type 2 diabetes mellitus. Onset time: He was diagnosed at approximate age of 20 years. His disease course has been worsening (He is diagnosed with essential tremors since last visit, following with neurology.). There are no hypoglycemic associated symptoms. Pertinent negatives for  hypoglycemia include no confusion, headaches, pallor or seizures. There are no diabetic associated symptoms. Pertinent negatives for diabetes include no chest pain, no fatigue, no polydipsia, no polyphagia, no polyuria and no weakness. There are no hypoglycemic complications. Symptoms are worsening. There are no diabetic complications. Risk factors for coronary artery disease include diabetes mellitus, dyslipidemia, hypertension, male sex and sedentary lifestyle. Current diabetic treatment includes insulin injections and oral agent (monotherapy). He is compliant with treatment most  of the time. His weight is increasing steadily. He is following a generally healthy diet. When asked about meal planning, he reported none. He has had a previous visit with a dietitian. He participates in exercise intermittently. His home blood glucose trend is increasing steadily. (He did not bring his meter nor logs to review today. However he is A1c is higher at 8.1%) An ACE inhibitor/angiotensin II receptor blocker is being taken.  Hyperlipidemia  This is a chronic problem. The current episode started more than 1 year ago. Pertinent negatives include no chest pain, myalgias or shortness of breath. Risk factors for coronary artery disease include diabetes mellitus, dyslipidemia, hypertension, male sex and a sedentary lifestyle.  Hypertension  This is a chronic problem. The current episode started more than 1 year ago. Pertinent negatives include no chest pain, headaches, neck pain, palpitations or shortness of breath. Risk factors for coronary artery disease include dyslipidemia and diabetes mellitus. Past treatments include ACE inhibitors.     Review of Systems  Constitutional: Negative for fatigue and unexpected weight change.  HENT: Negative for dental problem, mouth sores and trouble swallowing.   Eyes: Negative for visual disturbance.  Respiratory: Negative for cough, choking, chest tightness, shortness of breath and wheezing.   Cardiovascular: Negative for chest pain, palpitations and leg swelling.  Gastrointestinal: Negative for abdominal distention, abdominal pain, constipation, diarrhea, nausea and vomiting.  Endocrine: Negative for polydipsia, polyphagia and polyuria.  Genitourinary: Negative for dysuria, flank pain, hematuria and urgency.  Musculoskeletal: Negative for back pain, gait problem, myalgias and neck pain.  Skin: Negative for pallor, rash and wound.  Neurological: Negative for seizures, syncope, weakness, numbness and headaches.  Psychiatric/Behavioral: Negative.   Negative for confusion and dysphoric mood.    Objective:    BP 138/81   Pulse 68   Ht 6\' 1"  (1.854 m)   Wt 162 lb (73.5 kg)   BMI 21.37 kg/m   Wt Readings from Last 3 Encounters:  07/05/16 162 lb (73.5 kg)  06/29/16 162 lb (73.5 kg)  05/08/16 158 lb (71.7 kg)    Physical Exam  Constitutional: He is oriented to person, place, and time. He appears well-developed and well-nourished. He is cooperative. No distress.  HENT:  Head: Normocephalic and atraumatic.  Eyes: EOM are normal.  Neck: Normal range of motion. Neck supple. No tracheal deviation present. No thyromegaly present.  Cardiovascular: Normal rate, S1 normal, S2 normal and normal heart sounds.  Exam reveals no gallop.   No murmur heard. Pulses:      Dorsalis pedis pulses are 2+ on the right side, and 2+ on the left side.       Posterior tibial pulses are 2+ on the right side, and 2+ on the left side.  Pulmonary/Chest: Breath sounds normal. No respiratory distress. He has no wheezes.  Abdominal: Soft. Bowel sounds are normal. He exhibits no distension. There is no tenderness. There is no guarding and no CVA tenderness.  Musculoskeletal: He exhibits no edema.       Right shoulder: He exhibits no  swelling and no deformity.  Neurological: He is alert and oriented to person, place, and time. He has normal strength and normal reflexes. No cranial nerve deficit or sensory deficit. Coordination abnormal. Gait normal.  He has gross trembling of  his bilateral upper extremities, + tremor at rest.   Skin: Skin is warm and dry. No rash noted. No cyanosis. Nails show no clubbing.  Psychiatric: He has a normal mood and affect. His speech is normal and behavior is normal. Judgment and thought content normal. Cognition and memory are normal.    Results for orders placed or performed in visit on 06/20/16  Comprehensive metabolic panel  Result Value Ref Range   Sodium 137 135 - 146 mmol/L   Potassium 5.1 3.5 - 5.3 mmol/L   Chloride 100  98 - 110 mmol/L   CO2 25 20 - 31 mmol/L   Glucose, Bld 163 (H) 65 - 99 mg/dL   BUN 17 7 - 25 mg/dL   Creat 1.01 0.70 - 1.25 mg/dL   Total Bilirubin 0.3 0.2 - 1.2 mg/dL   Alkaline Phosphatase 53 40 - 115 U/L   AST 12 10 - 35 U/L   ALT 14 9 - 46 U/L   Total Protein 6.2 6.1 - 8.1 g/dL   Albumin 4.0 3.6 - 5.1 g/dL   Calcium 9.3 8.6 - 10.3 mg/dL  Hemoglobin A1c  Result Value Ref Range   Hgb A1c MFr Bld 8.1 (H) <5.7 %   Mean Plasma Glucose 186 mg/dL   Diabetic Labs (most recent): Lab Results  Component Value Date   HGBA1C 8.1 (H) 06/20/2016   HGBA1C 6.5 (H) 02/17/2016   HGBA1C 7.1 09/30/2015     Assessment & Plan:   1. Uncontrolled type 2 diabetes mellitus without complication, with long-term current use of insulin (HCC) -No gross complications so far, however patient remains at a high risk for more acute and chronic complications of diabetes which include CAD, CVA, CKD, retinopathy, and neuropathy. These are all discussed in detail with the patient.  Patient came with Out his meter nor logs, however Increasing A1c of 8.1% from 6.5%. - His A1c has generally improving from 10.5 %.  Recent labs reviewed.   - I have re-counseled the patient on diet management   by adopting a carbohydrate restricted / protein rich  Diet.  - Suggestion is made for patient to avoid simple carbohydrates   from their diet including Cakes , Desserts, Ice Cream,  Soda (  diet and regular) , Sweet Tea , Candies,  Chips, Cookies, Artificial Sweeteners,   and "Sugar-free" Products .  This will help patient to have stable blood glucose profile and potentially avoid unintended  Weight gain.  - Patient is advised to stick to a routine mealtimes to eat 3 meals  a day and avoid unnecessary snacks ( to snack only to correct hypoglycemia).  - The patient  has been  scheduled with Jearld Fenton, RDN, CDE for individualized DM education.  - I have approached patient with the following individualized plan to manage  diabetes and patient agrees.  - Based on his blood glucose profile he will not need prandial insulin at this time. - I will continue with  Toujeo  20 units daily at bedtime , associated with monitoring of blood glucose   daily before breakfast and as needed.   -I will continue his metformin  500 mg by mouth twice a day.  - his c-peptide is critically low at 0.5, indicating pancreatic exhaustion.  -  It is only a matter of time before he will require prandial insulin in addition to his basal insulin. - His GAD antibodies are negative, however does not rule out a possibility of LADA.  He is not a candidate for incretin therapy.  - Patient specific target  for A1c; LDL, HDL, Triglycerides, and  Waist Circumference were discussed in detail.  2) BP/HTN:  controlled. Continue current medications including ACEI/ARB. 3) Lipids/HPL:  continue statins. 4)  Weight/Diet: CDE consult in progress, exercise, and carbohydrates information provided.  5) Chronic Care/Health Maintenance:  -Patient is on ACEI/ARB and Statin medications and encouraged to continue to follow up with Ophthalmology, Podiatrist at least yearly or according to recommendations, and advised to  stay away from smoking. I have recommended yearly flu vaccine and pneumonia vaccination at least every 5 years; moderate intensity exercise for up to 150 minutes weekly; and  sleep for at least 7 hours a day. - Since his last visit, he was diagnosed with essential tremors. I advised him to continue follow-up with neurology. - 25 minutes of time was spent on the care of this patient , 50% of which was applied for counseling on diabetes complications and their preventions.  - I advised patient to maintain close follow up with [name], MD for primary care needs.  Patient is asked to bring meter and  blood glucose logs during their next visit.   Follow up plan: -Return in about 3 months (around 10/05/2016) for follow up with pre-visit  labs, meter, and logs.  Glade Lexie, MD Phone: [phone]  Fax: [phone]   07/05/2016, 11:26 AM

## 2016-07-05 NOTE — Patient Instructions (Signed)
1. Decrease Clonazepam to one tablet daily for one week then stop.   2. Decrease Propranolol to one tablet daily for one month then stop. Watch blood pressure to make sure it doesn't increase when stopping/lowering this medication.   3. Stay on Lyrica 75 mg - 2 tablets twice a day.

## 2016-07-05 NOTE — Procedures (Signed)
DBS Programming was performed.    Total time spent programming was 30 minutes.  Device was confirmed to be on.  Soft start was confirmed to be on.  Impedences were checked and were within normal limits.  Battery was checked and was determined to be functioning normally and not near the end of life.  Final settings were as follows:  Left brain electrode:     1-2+           ; Amplitude  2.5   V   ; Pulse width 90 microseconds;   Frequency   150   Hz.  Right brain electrode:     1-2+          ; Amplitude   2.0  V ;  Pulse width 90  microseconds;  Frequency   150    Hz.

## 2016-07-23 DIAGNOSIS — D649 Anemia, unspecified: Secondary | ICD-10-CM | POA: Diagnosis not present

## 2016-08-01 ENCOUNTER — Ambulatory Visit: Payer: PPO | Admitting: Adult Health

## 2016-08-03 DIAGNOSIS — H318 Other specified disorders of choroid: Secondary | ICD-10-CM | POA: Diagnosis not present

## 2016-08-03 DIAGNOSIS — E119 Type 2 diabetes mellitus without complications: Secondary | ICD-10-CM | POA: Diagnosis not present

## 2016-08-03 DIAGNOSIS — Z794 Long term (current) use of insulin: Secondary | ICD-10-CM | POA: Diagnosis not present

## 2016-08-03 DIAGNOSIS — Z7984 Long term (current) use of oral hypoglycemic drugs: Secondary | ICD-10-CM | POA: Diagnosis not present

## 2016-08-03 LAB — HM DIABETES EYE EXAM

## 2016-08-29 ENCOUNTER — Other Ambulatory Visit: Payer: Self-pay | Admitting: Neurology

## 2016-09-04 ENCOUNTER — Telehealth: Payer: Self-pay | Admitting: "Endocrinology

## 2016-09-04 NOTE — Telephone Encounter (Signed)
Pt bringing BG logs in to office to review

## 2016-09-04 NOTE — Telephone Encounter (Signed)
Lloyds blood sugar is running in the 60's and 70's he dropped his insulin dose to 15 units and its running about the same, please advise?

## 2016-10-12 ENCOUNTER — Ambulatory Visit: Payer: PPO | Admitting: Neurology

## 2016-10-12 ENCOUNTER — Other Ambulatory Visit: Payer: Self-pay | Admitting: "Endocrinology

## 2016-10-17 NOTE — Progress Notes (Signed)
Subjective:   John Reese was seen in consultation in the movement disorder clinic at the request of John Givens, NP.  His PCP is John Housekeeper, MD.  The evaluation is for tremor.  This patient is accompanied in the office by his spouse who supplements the history. The records that were made available to me were reviewed. Wife thinks that he has had tremor for as long as they have been married, for over 30 years.  His father and paternal GF had tremor.   He estimates that he started seeing Dr. Jannifer Reese 10 years ago but I only have records from the recent years.  He has been on klonopin for many years, 0.34m bid (never been on higher dosage) and is now on propranolol 80 mg bid as well for tremor.  Pt also has long hx of right trigeminal neuralgia and tried lyrica, 1053mtid but it caused asterixis (pt states that he changed because of cost more than SE).  He is on tegretol but had hyponatremia with this in the past.  Is on gabapentin 300 mg bid.    Affected by caffeine:  No. (2 cups of coffee/day) Affected by alcohol: (doesn't drink EtOH) Affected by stress:  Yes.   Affected by fatigue:  No. Spills soup if on spoon:  Yes.   Spills glass of liquid if full:  Yes.   Affects ADL's (tying shoes, brushing teeth, etc):  Yes.   (shaves with electric now because of tremor; brushes teeth with both hands; doesn't wear many shoes with ties; wife has to give insulin shots)  12/15/15 update:  Pt f/u today.   He is accompanied by his wife who supplements the hx.   The records that were made available to me were reviewed.  He had neuropsych testing with Dr. BaSi Reese 8/2 and f/u with her on 8/17.  There was no evidence of dementia and felt he would be a good DBS candidate.  Pt expresses desire to proceed with next step.  Expresses desire to do bilateral surgery due to severity of tremor.  He is R hand dominant.  03/12/16 update:  Pt f/u accompanied by his wife who supplements the history.  He is scheduled  for fiducial placement tomorrow.  Comes in today for pre-op video and any last minute questions.  He is on gabapentin 400 mg tid, tegretol - 20029m1 po tid for right sided TN.  It still is providing him pain on the right.  Also on klonopin 0.25 mg bid for tremor.  On propranolol 40 mg - 2 po bid.    04/26/16 update:  The patient presents, accompanied by his wife and daughter who supplement the history.  The patient underwent bilateral VIM DBS on 03/22/2016.  He had his IPG placed on 03/30/2016.  The patient has now recovered well.  He is back on his clonazepam 0.25 mg twice a day and propranolol 40 mg, 2 tablets twice per day.  He forgot to hold those today.  He has had no falls.  He does have a history of trigeminal neuralgia for which he is on gabapentin, 400 mg 3 times per day and Tegretol 200 mg 3 times per day.  Noticing more "jerking" of the arms since gabapentin dose increased for TN but doesn't think that the increased help.  05/08/16 update:  The patient presents today for follow-up, accompanied by his wife and granddaughter who supplements the history.  The patient underwent bilateral VIM DBS on 03/22/2016.  His  device was activated on 04/26/2016.  His wife had called me to state that he was having slurring of speech and falls, but she really thought that was more related to the gabapentin.  Pt states that it wasn't falls but near falls.  Hasn't fallen since hospital stay.  Although I was unsure if the changes were really from gabapentin as his wife thought and felt that it certainly could be related to turning on the DBS, I had him hold the gabapentin.  This made the face pain much worse and they called me back not long thereafter.  However, his speech, confusion/forgetfullness and balance change were much better.  I asked about Lyrica, and it turns out he had tried in the past, but not for long because of cost issues.  Records indicated that perhaps it caused asterixis, but the patient stated that he  changed it because of cost.  I gave him some samples of Lyrica just to get him through today's visit to see how he did and he states that he ended up taking some he had at home and he took 100 mg bid.  He also remains on Tegretol, 200 mg 3 times a day for trigeminal neuralgia.   He is happy with tremor control.  Can give himself his own insulin shot and drink his own coffee now.  Sx's of trigeminal neuralgia are the biggest complaint.  Having such severe face pain that he quit eating for 2 days (the 2 days where he stopped the gabapentin and had not started the Lyrica yet).  07/05/16 update:  Patient returns today, accompanied by his wife who supplements the history.  The patient underwent bilateral VIM DBS on 03/22/2016 with device activation on 04/26/2016.  Last visit, they told me that they thought his speech change was from gabapentin, so we have discontinued that for his trigeminal neuralgia and changed him to Lyrica.  He is currently on Lyrica, 75 mg, 2 po bid. That is working well for the face pain.  He describes occasional myoclonus.   They did call me since last visit and stated that his speech and walking really have been slower.  His wife states that it is intermittent and it is really good today.  I asked them to turn off each site independently for a day, and they did that but noted no change in speech with that.  Wife states that speech better over last few days.  I did ask the patient to drop his propranolol last visit from 80 mg twice per day to 40 mg twice per day.  He would like to stop that and his klonopin.    10/19/16 update:  Patient returns today, accompanied by his wife who supplements the history.  Have him slowly wean off propranolol since last visit.  I made a significant programming change last visit to see if that would help with speech.  We changed contacts that were being used.  The patient states that that was a really good change as his speech is better and he is no longer  stumbling.  He noted L arm and R leg tremor the last few weeks.  He is still on Lyrica, 150 mg twice a day for trigeminal neuralgia.  Tried to go off of tegretol but couldn't.  Well controlled right now.    Current/Previously tried tremor medications: klonopin, propranolol, lyrica (for TN), gabapentin (for TN)  Current medications that may exacerbate tremor:  n/a  Outside reports reviewed: historical medical records, lab  reports and office notes.  Allergies  Allergen Reactions  . No Known Allergies     Outpatient Encounter Prescriptions as of 10/19/2016  Medication Sig  . carbamazepine (TEGRETOL) 200 MG tablet TAKE 1 AND 1/2 TABLETS IN THE MORNING, 1 TABLET AT MIDDAY AND 1 AND 1/2 TABLETS IN THE EVENING  . CINNAMON PO Take 1,000 mg by mouth daily.  . fish oil-omega-3 fatty acids 1000 MG capsule Take 2 g by mouth daily.  . metFORMIN (GLUCOPHAGE) 500 MG tablet TAKE 1 TABLET (500 MG TOTAL) BY MOUTH 2 (TWO) TIMES DAILY WITH A MEAL.  . Multiple Vitamin (MULTIVITAMIN) tablet Take 1 tablet by mouth daily.  . pravastatin (PRAVACHOL) 40 MG tablet Take 40 mg by mouth daily.  . pregabalin (LYRICA) 75 MG capsule Take 2 capsules (150 mg total) by mouth 2 (two) times daily.  . ramipril (ALTACE) 2.5 MG capsule Take 2.5 mg by mouth daily.  [name] 300 UNIT/ML SOPN Inject 20 Units into the skin at bedtime.  Marland Kitchen ULTICARE MINI PEN NEEDLES 31G X 6 MM MISC USE AS DIRECTED AT BEDTIME  . [DISCONTINUED] clonazePAM (KLONOPIN) 0.25 MG disintegrating tablet DISSOLVE 1 TABLET TWICE DAILY  . [DISCONTINUED] propranolol (INDERAL) 40 MG tablet TAKE 2 TABLETS (80 MG TOTAL) BY MOUTH 2 (TWO) TIMES DAILY. (Patient taking differently: TAKE 1 TABLETS (80 MG TOTAL) BY MOUTH 2 (TWO) TIMES DAILY.)   No facility-administered encounter medications on file as of 10/19/2016.     Past Medical History:  Diagnosis Date  . Auditory disturbance    Decreased auditory acuity  . 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 Diabetes ([place])   . Dyslipidemia   . Tremor   . Trigeminal neuralgia    Right, V2 distribution    Past Surgical History:  Procedure Laterality Date  . FINGER AMPUTATION    . INNER EAR SURGERY Right    Ear drum repair  . PULSE GENERATOR IMPLANT Bilateral 03/30/2016   Procedure: BILATERAL PLACEMENT OF IMPLANTABLE PULSE GENERATOR;  Surgeon: Erline Levine, MD;  Location: [place];  Service: Neurosurgery;  Laterality: Bilateral;  BILATERAL PLACEMENT OF IMPLANTABLE PULSE GENERATOR  . SUBTHALAMIC STIMULATOR INSERTION Bilateral 03/22/2016   Procedure: BILATERAL DEEP BRAIN STIMULATOR PLACEMENT WITH STARFIX WITH DR. Daria Mcmeekin;  Surgeon: Erline Levine, MD;  Location: [place];  Service: Neurosurgery;  Laterality: Bilateral;    Social History   Social History  . Marital status: Married    Spouse name: N/A  . Number of children: 2  . Years of education: HS   Occupational History  . Works in Theatre manager Other   Social History Main Topics  . Smoking status: Never Smoker  . Smokeless tobacco: Never Used  . Alcohol use No  . Drug use: No  . Sexual activity: Not on file   Other Topics Concern  . Not on file   Social History Narrative   Patient is right handed.   Patient drinks 2 cups coffee daily.    Family Status  Relation Status  . Father Deceased at age 57  . Sister Alive  . Brother Deceased  . Mother Alive  . Brother Alive  . Brother Alive  . Brother Alive  . Brother Alive  . Sister Alive    Review of Systems A complete 10 system ROS was obtained and was negative apart from what is mentioned.   Objective:   VITALS:   Vitals:   10/19/16 0831  BP: 140/70  Pulse:  85  SpO2: 97%  Weight: 152 lb (68.9 kg)  Height: 6\' 1"  (1.854 m)   Gen:  Appears stated age and in NAD. HEENT:  Normocephalic.  Head wounds are healing well. The mucous membranes are moist. The superficial temporal arteries are without ropiness or tenderness. Cardiovascular: Regular  rate and rhythm. Lungs: Clear to auscultation bilaterally. Neck: There are no carotid bruits noted bilaterally.  NEUROLOGICAL:  Orientation:  The patient is alert and oriented x 3.   Cranial nerves: There is good facial symmetry.  Speech is fluent and clear. Soft palate rises symmetrically and there is no tongue deviation. Hearing is intact to conversational tone. Tone: Tone is good throughout. Sensation: Sensation is intact to light touch Coordination:  The patient has no dysdiadichokinesia or dysmetria. Motor: Strength is 5/5 in the bilateral upper and lower extremities.  Shoulder shrug is equal bilaterally.  There is no pronator drift.  There are no fasciculations noted. Gait and Station: The patient is able to ambulate without difficulty.   MOVEMENT EXAM: Abnormal movements:  There is a tremor on the L hand, both at rest and with intention.  There is some myoclonus bilaterally  DBS programming was performed today and described in more detail on a separate programming procedure note.    LABS:    Chemistry      Component Value Date/Time   NA 137 06/20/2016 0803   NA 138 10/26/2015 1026   K 5.1 06/20/2016 0803   CL 100 06/20/2016 0803   CO2 25 06/20/2016 0803   BUN 17 06/20/2016 0803   BUN 18 10/26/2015 1026   CREATININE 1.01 06/20/2016 0803      Component Value Date/Time   CALCIUM 9.3 06/20/2016 0803   ALKPHOS 53 06/20/2016 0803   AST 12 06/20/2016 0803   ALT 14 06/20/2016 0803   BILITOT 0.3 06/20/2016 0803   BILITOT <0.2 10/26/2015 1026     No results found for: VITAMINB12  No results found for: TSH      Assessment/Plan:   1.  Essential Tremor.  -The patient underwent bilateral VIM DBS on 03/22/2016.  Switched contacts last visit and speech much better.  Adjusted device today for tremor improvement and seemed to help.  Does have some myoclonus which may be from lyrica  -wean off klonopin  -now off of propranolol  2.  R trigeminal neuragia  -Off of  gabapentin.  -improved in summer.  Has decreased lyrica to 75 mg bid and in winter takes 150 mg bid.    -on tegretol.  Tried to wean off of it and sx's got worse and went back on it.  3.  Diabetic peripheral neuropathy  -talked about safety  4.   Much greater than 50% of this visit was spent in counseling and coordinating care.  Total face to face time:  25 min which didn't include DBS programming time of 20 min

## 2016-10-19 ENCOUNTER — Encounter: Payer: Self-pay | Admitting: Neurology

## 2016-10-19 ENCOUNTER — Ambulatory Visit (INDEPENDENT_AMBULATORY_CARE_PROVIDER_SITE_OTHER): Payer: PPO | Admitting: Neurology

## 2016-10-19 VITALS — BP 140/70 | HR 85 | Ht 73.0 in | Wt 152.0 lb

## 2016-10-19 DIAGNOSIS — G250 Essential tremor: Secondary | ICD-10-CM | POA: Diagnosis not present

## 2016-10-19 DIAGNOSIS — Z9689 Presence of other specified functional implants: Secondary | ICD-10-CM | POA: Diagnosis not present

## 2016-10-19 MED ORDER — PREGABALIN 75 MG PO CAPS: 150.0000 mg | ORAL_CAPSULE | Freq: Two times a day (BID) | ORAL | 0 refills | Status: DC

## 2016-10-19 NOTE — Procedures (Signed)
DBS Programming was performed.    Total time spent programming was 20 minutes.  Device was confirmed to be on.  Soft start was confirmed to be on.  Impedences were checked and were within normal limits.  Battery was checked and was determined to be functioning normally and not near the end of life.  Final settings were as follows:  Left brain electrode:     1-2+           ; Amplitude  2.7   V   ; Pulse width 90 microseconds;   Frequency   150   Hz.  Right brain electrode:     1-2+          ; Amplitude   2.4  V ;  Pulse width 90  microseconds;  Frequency   150    Hz.

## 2016-10-22 ENCOUNTER — Other Ambulatory Visit: Payer: Self-pay | Admitting: "Endocrinology

## 2016-10-22 DIAGNOSIS — E1165 Type 2 diabetes mellitus with hyperglycemia: Secondary | ICD-10-CM | POA: Diagnosis not present

## 2016-10-22 DIAGNOSIS — Z794 Long term (current) use of insulin: Secondary | ICD-10-CM | POA: Diagnosis not present

## 2016-10-22 LAB — COMPREHENSIVE METABOLIC PANEL
ALBUMIN: 3.9 g/dL (ref 3.6–5.1)
ALT: 14 U/L (ref 9–46)
AST: 13 U/L (ref 10–35)
Alkaline Phosphatase: 52 U/L (ref 40–115)
BUN: 16 mg/dL (ref 7–25)
CHLORIDE: 100 mmol/L (ref 98–110)
CO2: 27 mmol/L (ref 20–31)
CREATININE: 0.93 mg/dL (ref 0.70–1.25)
Calcium: 8.8 mg/dL (ref 8.6–10.3)
Glucose, Bld: 84 mg/dL (ref 65–99)
Potassium: 4.4 mmol/L (ref 3.5–5.3)
SODIUM: 134 mmol/L — AB (ref 135–146)
Total Bilirubin: 0.4 mg/dL (ref 0.2–1.2)
Total Protein: 6.1 g/dL (ref 6.1–8.1)

## 2016-10-23 LAB — HEMOGLOBIN A1C
Hgb A1c MFr Bld: 6.4 % — ABNORMAL HIGH (ref <5.7)
MEAN PLASMA GLUCOSE: 137 mg/dL

## 2016-11-02 ENCOUNTER — Encounter: Payer: Self-pay | Admitting: "Endocrinology

## 2016-11-02 ENCOUNTER — Ambulatory Visit (INDEPENDENT_AMBULATORY_CARE_PROVIDER_SITE_OTHER): Payer: PPO | Admitting: "Endocrinology

## 2016-11-02 VITALS — Ht 73.0 in | Wt 152.0 lb

## 2016-11-02 DIAGNOSIS — E1165 Type 2 diabetes mellitus with hyperglycemia: Secondary | ICD-10-CM | POA: Diagnosis not present

## 2016-11-02 DIAGNOSIS — I10 Essential (primary) hypertension: Secondary | ICD-10-CM

## 2016-11-02 DIAGNOSIS — IMO0001 Reserved for inherently not codable concepts without codable children: Secondary | ICD-10-CM

## 2016-11-02 DIAGNOSIS — Z794 Long term (current) use of insulin: Secondary | ICD-10-CM

## 2016-11-02 DIAGNOSIS — E782 Mixed hyperlipidemia: Secondary | ICD-10-CM | POA: Diagnosis not present

## 2016-11-02 NOTE — Progress Notes (Signed)
Subjective:    Patient ID: John Reese, male    DOB: [birth year], PCP John Housekeeper, MD   Past Medical History:  Diagnosis Date  . Auditory disturbance    Decreased auditory acuity  . 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 Diabetes ([place])   . Dyslipidemia   . Tremor   . Trigeminal neuralgia    Right, V2 distribution   Past Surgical History:  Procedure Laterality Date  . FINGER AMPUTATION    . INNER EAR SURGERY Right    Ear drum repair  . PULSE GENERATOR IMPLANT Bilateral 03/30/2016   Procedure: BILATERAL PLACEMENT OF IMPLANTABLE PULSE GENERATOR;  Surgeon: Erline Levine, MD;  Location: [facility];  Service: Neurosurgery;  Laterality: Bilateral;  BILATERAL PLACEMENT OF IMPLANTABLE PULSE GENERATOR  . SUBTHALAMIC STIMULATOR INSERTION Bilateral 03/22/2016   Procedure: BILATERAL DEEP BRAIN STIMULATOR PLACEMENT WITH STARFIX WITH DR. [name];  Surgeon: Erline Levine, MD;  Location: [facility];  Service: Neurosurgery;  Laterality: Bilateral;   Social History   Social History  . Marital status: Married    Spouse name: N/A  . Number of children: 2  . Years of education: HS   Occupational History  . Works in Theatre manager Other   Social History Main Topics  . Smoking status: Never Smoker  . Smokeless tobacco: Never Used  . Alcohol use No  . Drug use: No  . Sexual activity: Not Asked   Other Topics Concern  . None   Social History Narrative   Patient is right handed.   Patient drinks 2 cups coffee daily.   Outpatient Encounter Prescriptions as of 11/02/2016  Medication Sig  . carbamazepine (TEGRETOL) 200 MG tablet TAKE 1 AND 1/2 TABLETS IN THE MORNING, 1 TABLET AT MIDDAY AND 1 AND 1/2 TABLETS IN THE EVENING (Patient taking differently: 2 tablets in the morning, 2 tablets in the evening)  . CINNAMON PO Take 1,000 mg by mouth daily.  . fish oil-omega-3 fatty acids 1000 MG capsule Take 2 g by mouth daily.  . metFORMIN (GLUCOPHAGE) 500 MG tablet  TAKE 1 TABLET (500 MG TOTAL) BY MOUTH 2 (TWO) TIMES DAILY WITH A MEAL.  . Multiple Vitamin (MULTIVITAMIN) tablet Take 1 tablet by mouth daily.  . pravastatin (PRAVACHOL) 40 MG tablet Take 40 mg by mouth daily.  . pregabalin (LYRICA) 75 MG capsule Take 2 capsules (150 mg total) by mouth 2 (two) times daily.  . ramipril (ALTACE) 2.5 MG capsule Take 2.5 mg by mouth daily.  John Reese 300 UNIT/ML SOPN Inject 20 Units into the skin at bedtime. (Patient taking differently: Inject 15 Units into the skin at bedtime. )  . ULTICARE MINI PEN NEEDLES 31G X 6 MM MISC USE AS DIRECTED AT BEDTIME   No facility-administered encounter medications on file as of 11/02/2016.    ALLERGIES: Allergies  Allergen Reactions  . No Known Allergies    VACCINATION STATUS:  There is no immunization history on file for this patient.  Diabetes  He presents for his follow-up diabetic visit. He has type 2 diabetes mellitus. Onset time: He was diagnosed at approximate age of 15 years. His disease course has been improving (He is diagnosed with essential tremors since last visit, following with neurology.). There are no hypoglycemic associated symptoms. Pertinent negatives for hypoglycemia include no confusion, headaches, pallor or seizures. There are no diabetic associated symptoms. Pertinent negatives for diabetes include no chest pain, no fatigue, no polydipsia, no polyphagia, no  polyuria and no weakness. There are no hypoglycemic complications. Symptoms are improving. There are no diabetic complications. Risk factors for coronary artery disease include diabetes mellitus, dyslipidemia, hypertension, male sex and sedentary lifestyle. Current diabetic treatment includes insulin injections and oral agent (monotherapy). He is compliant with treatment most of the time. His weight is decreasing steadily. He is following a generally healthy diet. When asked about meal planning, he reported none. He has had a previous visit with a  dietitian. He participates in exercise intermittently. His home blood glucose trend is increasing steadily. His breakfast blood glucose range is generally 110-130 mg/dl. His overall blood glucose range is 110-130 mg/dl. (He did not bring his meter nor logs to review today. However he is A1c is higher at 8.1%) An ACE inhibitor/angiotensin II receptor blocker is being taken.  Hyperlipidemia  This is a chronic problem. The current episode started more than 1 year ago. Pertinent negatives include no chest pain, myalgias or shortness of breath. Risk factors for coronary artery disease include diabetes mellitus, dyslipidemia, hypertension, male sex and a sedentary lifestyle.  Hypertension  This is a chronic problem. The current episode started more than 1 year ago. Pertinent negatives include no chest pain, headaches, neck pain, palpitations or shortness of breath. Risk factors for coronary artery disease include dyslipidemia and diabetes mellitus. Past treatments include ACE inhibitors.     Review of Systems  Constitutional: Negative for fatigue and unexpected weight change.  HENT: Negative for dental problem, mouth sores and trouble swallowing.   Eyes: Negative for visual disturbance.  Respiratory: Negative for cough, choking, chest tightness, shortness of breath and wheezing.   Cardiovascular: Negative for chest pain, palpitations and leg swelling.  Gastrointestinal: Negative for abdominal distention, abdominal pain, constipation, diarrhea, nausea and vomiting.  Endocrine: Negative for polydipsia, polyphagia and polyuria.  Genitourinary: Negative for dysuria, flank pain, hematuria and urgency.  Musculoskeletal: Negative for back pain, gait problem, myalgias and neck pain.  Skin: Negative for pallor, rash and wound.  Neurological: Negative for seizures, syncope, weakness, numbness and headaches.  Psychiatric/Behavioral: Negative.  Negative for confusion and dysphoric mood.    Objective:    Ht 6'  1" (1.854 m)   Wt 152 lb (68.9 kg)   BMI 20.05 kg/m   Wt Readings from Last 3 Encounters:  11/02/16 152 lb (68.9 kg)  10/19/16 152 lb (68.9 kg)  07/05/16 161 lb (73 kg)    Physical Exam  Constitutional: He is oriented to person, place, and time. He appears well-developed and well-nourished. He is cooperative. No distress.  HENT:  Head: Normocephalic and atraumatic.  Eyes: EOM are normal.  Neck: Normal range of motion. Neck supple. No tracheal deviation present. No thyromegaly present.  Cardiovascular: Normal rate, S1 normal, S2 normal and normal heart sounds.  Exam reveals no gallop.   No murmur heard. Pulses:      Dorsalis pedis pulses are 2+ on the right side, and 2+ on the left side.       Posterior tibial pulses are 2+ on the right side, and 2+ on the left side.  Pulmonary/Chest: Breath sounds normal. No respiratory distress. He has no wheezes.  Abdominal: Soft. Bowel sounds are normal. He exhibits no distension. There is no tenderness. There is no guarding and no CVA tenderness.  Musculoskeletal: He exhibits no edema.       Right shoulder: He exhibits no swelling and no deformity.  Neurological: He is alert and oriented to person, place, and time. He has normal strength  and normal reflexes. No cranial nerve deficit or sensory deficit. Coordination abnormal. Gait normal.     Skin: Skin is warm and dry. No rash noted. No cyanosis. Nails show no clubbing.  Psychiatric: He has a normal mood and affect. His speech is normal and behavior is normal. Judgment and thought content normal. Cognition and memory are normal.    Results for orders placed or performed in visit on 10/22/16  Comprehensive metabolic panel  Result Value Ref Range   Sodium 134 (L) 135 - 146 mmol/L   Potassium 4.4 3.5 - 5.3 mmol/L   Chloride 100 98 - 110 mmol/L   CO2 27 20 - 31 mmol/L   Glucose, Bld 84 65 - 99 mg/dL   BUN 16 7 - 25 mg/dL   Creat 0.93 0.70 - 1.25 mg/dL   Total Bilirubin 0.4 0.2 - 1.2 mg/dL    Alkaline Phosphatase 52 40 - 115 U/L   AST 13 10 - 35 U/L   ALT 14 9 - 46 U/L   Total Protein 6.1 6.1 - 8.1 g/dL   Albumin 3.9 3.6 - 5.1 g/dL   Calcium 8.8 8.6 - 10.3 mg/dL  Hemoglobin A1c  Result Value Ref Range   Hgb A1c MFr Bld 6.4 (H) <5.7 %   Mean Plasma Glucose 137 mg/dL   Diabetic Labs (most recent): Lab Results  Component Value Date   HGBA1C 6.4 (H) 10/22/2016   HGBA1C 8.1 (H) 06/20/2016   HGBA1C 6.5 (H) 02/17/2016     Assessment & Plan:   1. Uncontrolled type 2 diabetes mellitus without complication, with long-term current use of insulin (HCC) -No gross complications so far, however patient remains at a high risk for more acute and chronic complications of diabetes which include CAD, CVA, CKD, retinopathy, and neuropathy. These are all discussed in detail with the patient.  Patient came with Improving blood glucose profile, his A1c improving to 6.4% from 8.1%.  - His A1c has generally improving from 10.5 %.  Recent labs reviewed.   - I have re-counseled the patient on diet management   by adopting a carbohydrate restricted / protein rich  Diet.  - Suggestion is made for patient to avoid simple carbohydrates   from his diet including Cakes , Desserts, Ice Cream,  Soda (  diet and regular) , Sweet Tea , Candies,  Chips, Cookies, Artificial Sweeteners,   and "Sugar-free" Products .  This will help patient to have stable blood glucose profile and potentially avoid unintended  Weight gain.  - Patient is advised to stick to a routine mealtimes to eat 3 meals  a day and avoid unnecessary snacks ( to snack only to correct hypoglycemia).  - The patient  has been  scheduled with Jearld Fenton, RDN, CDE for individualized DM education.  - I have approached patient with the following individualized plan to manage diabetes and patient agrees.   - I will lower his  Toujeo to 15 units daily at bedtime , associated with monitoring of blood glucose   daily before breakfast and as  needed.   -I will continue his metformin  500 mg by mouth twice a day.  - his c-peptide is critically low at 0.5, indicating pancreatic exhaustion.  - It is only a matter of time before he will require prandial insulin in addition to his basal insulin. - His GAD antibodies are negative, however does not rule out a possibility of LADA.  He is not a candidate for incretin therapy.  -  Patient specific target  for A1c; LDL, HDL, Triglycerides, and  Waist Circumference were discussed in detail.  2) BP/HTN:  controlled. Continue current medications including ACEI/ARB. 3) Lipids/HPL:  continue statins. 4)  Weight/Diet: CDE consult in progress, exercise, and carbohydrates information provided.  5) Chronic Care/Health Maintenance:  -Patient is on ACEI/ARB and Statin medications and encouraged to continue to follow up with Ophthalmology, Podiatrist at least yearly or according to recommendations, and advised to  stay away from smoking. I have recommended yearly flu vaccine and pneumonia vaccination at least every 5 years; moderate intensity exercise for up to 150 minutes weekly; and  sleep for at least 7 hours a day. - Since his last visit, he was diagnosed with essential tremors. I advised him to continue follow-up with neurology. - 25 minutes of time was spent on the care of this patient , 50% of which was applied for counseling on diabetes complications and their preventions.  - I advised patient to maintain close follow up with John Housekeeper, MD for primary care needs.  Patient is asked to bring meter and  blood glucose logs during his next visit.   Follow up plan: -Return in about 3 months (around 02/02/2017) for meter, and logs.  Glade Yerik, MD Phone: [phone]  Fax: [phone]   11/02/2016, 11:31 AM

## 2016-12-27 DIAGNOSIS — R799 Abnormal finding of blood chemistry, unspecified: Secondary | ICD-10-CM | POA: Diagnosis not present

## 2016-12-27 DIAGNOSIS — I10 Essential (primary) hypertension: Secondary | ICD-10-CM | POA: Diagnosis not present

## 2016-12-27 DIAGNOSIS — E119 Type 2 diabetes mellitus without complications: Secondary | ICD-10-CM | POA: Diagnosis not present

## 2016-12-27 DIAGNOSIS — E785 Hyperlipidemia, unspecified: Secondary | ICD-10-CM | POA: Diagnosis not present

## 2017-01-02 DIAGNOSIS — E1169 Type 2 diabetes mellitus with other specified complication: Secondary | ICD-10-CM | POA: Diagnosis not present

## 2017-01-02 DIAGNOSIS — I10 Essential (primary) hypertension: Secondary | ICD-10-CM | POA: Diagnosis not present

## 2017-01-02 DIAGNOSIS — E1149 Type 2 diabetes mellitus with other diabetic neurological complication: Secondary | ICD-10-CM | POA: Diagnosis not present

## 2017-01-02 DIAGNOSIS — E785 Hyperlipidemia, unspecified: Secondary | ICD-10-CM | POA: Diagnosis not present

## 2017-01-02 DIAGNOSIS — E1159 Type 2 diabetes mellitus with other circulatory complications: Secondary | ICD-10-CM | POA: Diagnosis not present

## 2017-01-02 DIAGNOSIS — Z23 Encounter for immunization: Secondary | ICD-10-CM | POA: Diagnosis not present

## 2017-01-02 DIAGNOSIS — E1140 Type 2 diabetes mellitus with diabetic neuropathy, unspecified: Secondary | ICD-10-CM | POA: Diagnosis not present

## 2017-01-08 ENCOUNTER — Other Ambulatory Visit: Payer: Self-pay | Admitting: "Endocrinology

## 2017-02-01 DIAGNOSIS — E1165 Type 2 diabetes mellitus with hyperglycemia: Secondary | ICD-10-CM | POA: Diagnosis not present

## 2017-02-01 DIAGNOSIS — Z794 Long term (current) use of insulin: Secondary | ICD-10-CM | POA: Diagnosis not present

## 2017-02-02 LAB — HEMOGLOBIN A1C
HEMOGLOBIN A1C: 6.2 %{Hb} — AB (ref <5.7)
Mean Plasma Glucose: 131 (calc)
eAG (mmol/L): 7.3 (calc)

## 2017-02-02 LAB — RENAL FUNCTION PANEL
ALBUMIN MSPROF: 4.1 g/dL (ref 3.6–5.1)
BUN: 14 mg/dL (ref 7–25)
CALCIUM: 9.3 mg/dL (ref 8.6–10.3)
CO2: 30 mmol/L (ref 20–32)
Chloride: 93 mmol/L — ABNORMAL LOW (ref 98–110)
Creat: 0.92 mg/dL (ref 0.70–1.25)
GLUCOSE: 88 mg/dL (ref 65–99)
PHOSPHORUS: 4 mg/dL (ref 2.1–4.3)
POTASSIUM: 5.3 mmol/L (ref 3.5–5.3)
Sodium: 129 mmol/L — ABNORMAL LOW (ref 135–146)

## 2017-02-08 ENCOUNTER — Ambulatory Visit (INDEPENDENT_AMBULATORY_CARE_PROVIDER_SITE_OTHER): Payer: PPO | Admitting: "Endocrinology

## 2017-02-08 ENCOUNTER — Encounter: Payer: Self-pay | Admitting: "Endocrinology

## 2017-02-08 VITALS — BP 135/73 | HR 82 | Ht 73.0 in | Wt 153.0 lb

## 2017-02-08 DIAGNOSIS — E1165 Type 2 diabetes mellitus with hyperglycemia: Secondary | ICD-10-CM

## 2017-02-08 DIAGNOSIS — I10 Essential (primary) hypertension: Secondary | ICD-10-CM | POA: Diagnosis not present

## 2017-02-08 DIAGNOSIS — E782 Mixed hyperlipidemia: Secondary | ICD-10-CM | POA: Diagnosis not present

## 2017-02-08 DIAGNOSIS — Z794 Long term (current) use of insulin: Secondary | ICD-10-CM

## 2017-02-08 DIAGNOSIS — IMO0001 Reserved for inherently not codable concepts without codable children: Secondary | ICD-10-CM

## 2017-02-08 MED ORDER — TOUJEO SOLOSTAR 300 UNIT/ML ~~LOC~~ SOPN: 10.0000 [IU] | PEN_INJECTOR | Freq: Every day | SUBCUTANEOUS | 2 refills | Status: DC

## 2017-02-08 NOTE — Progress Notes (Signed)
Subjective:    Patient ID: John Reese, male    DOB: 10/01/1950, PCP [name], MD   Past Medical History:  Diagnosis Date  . Auditory disturbance    Decreased auditory acuity  . 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 Diabetes ([place])   . Dyslipidemia   . Tremor   . Trigeminal neuralgia    Right, V2 distribution   Past Surgical History:  Procedure Laterality Date  . FINGER AMPUTATION    . INNER EAR SURGERY Right    Ear drum repair  . PULSE GENERATOR IMPLANT Bilateral 03/30/2016   Procedure: BILATERAL PLACEMENT OF IMPLANTABLE PULSE GENERATOR;  Surgeon: Erline Levine, MD;  Location: [facility];  Service: Neurosurgery;  Laterality: Bilateral;  BILATERAL PLACEMENT OF IMPLANTABLE PULSE GENERATOR  . SUBTHALAMIC STIMULATOR INSERTION Bilateral 03/22/2016   Procedure: BILATERAL DEEP BRAIN STIMULATOR PLACEMENT WITH STARFIX WITH DR. [name];  Surgeon: Erline Levine, MD;  Location: [facility];  Service: Neurosurgery;  Laterality: Bilateral;   Social History   Social History  . Marital status: Married    Spouse name: N/A  . Number of children: 2  . Years of education: HS   Occupational History  . Works in Theatre manager Other   Social History Main Topics  . Smoking status: Never Smoker  . Smokeless tobacco: Never Used  . Alcohol use No  . Drug use: No  . Sexual activity: Not Asked   Other Topics Concern  . None   Social History Narrative   Patient is right handed.   Patient drinks 2 cups coffee daily.   Outpatient Encounter Prescriptions as of 02/08/2017  Medication Sig  . carbamazepine (TEGRETOL) 200 MG tablet TAKE 1 AND 1/2 TABLETS IN THE MORNING, 1 TABLET AT MIDDAY AND 1 AND 1/2 TABLETS IN THE EVENING (Patient taking differently: 2 tablets in the morning, 2 tablets in the evening)  . CINNAMON PO Take 1,000 mg by mouth daily.  . fish oil-omega-3 fatty acids 1000 MG capsule Take 2 g by mouth daily.  . metFORMIN (GLUCOPHAGE) 500 MG tablet  TAKE 1 TABLET (500 MG TOTAL) BY MOUTH 2 (TWO) TIMES DAILY WITH A MEAL.  . Multiple Vitamin (MULTIVITAMIN) tablet Take 1 tablet by mouth daily.  . pravastatin (PRAVACHOL) 40 MG tablet Take 40 mg by mouth daily.  . pregabalin (LYRICA) 75 MG capsule Take 2 capsules (150 mg total) by mouth 2 (two) times daily.  . ramipril (ALTACE) 2.5 MG capsule Take 2.5 mg by mouth daily.  [name] 300 UNIT/ML SOPN Inject 10 Units into the skin at bedtime.  Marland Kitchen ULTICARE MINI PEN NEEDLES 31G X 6 MM MISC USE AS DIRECTED AT BEDTIME  . [DISCONTINUED] TOUJEO SOLOSTAR 300 UNIT/ML SOPN Inject 20 Units into the skin at bedtime. (Patient taking differently: Inject 15 Units into the skin at bedtime. )   No facility-administered encounter medications on file as of 02/08/2017.    ALLERGIES: Allergies  Allergen Reactions  . No Known Allergies    VACCINATION STATUS:  There is no immunization history on file for this patient.  Diabetes  He presents for his follow-up diabetic visit. He has type 2 diabetes mellitus. Onset time: He was diagnosed at approximate age of 65 years. His disease course has been stable (He is diagnosed with essential tremors since last visit, following with neurology.). There are no hypoglycemic associated symptoms. Pertinent negatives for hypoglycemia include no confusion, headaches, pallor or seizures. There are no diabetic associated  symptoms. Pertinent negatives for diabetes include no chest pain, no fatigue, no polydipsia, no polyphagia, no polyuria and no weakness. There are no hypoglycemic complications. Symptoms are stable. There are no diabetic complications. Risk factors for coronary artery disease include diabetes mellitus, dyslipidemia, hypertension, male sex and sedentary lifestyle. Current diabetic treatment includes insulin injections and oral agent (monotherapy). He is compliant with treatment most of the time. His weight is stable. He is following a generally healthy diet. When asked  about meal planning, he reported none. He has had a previous visit with a dietitian. He participates in exercise intermittently. His home blood glucose trend is increasing steadily. His breakfast blood glucose range is generally 110-130 mg/dl. His overall blood glucose range is 110-130 mg/dl. An ACE inhibitor/angiotensin II receptor blocker is being taken.  Hyperlipidemia  This is a chronic problem. The current episode started more than 1 year ago. Pertinent negatives include no chest pain, myalgias or shortness of breath. Risk factors for coronary artery disease include diabetes mellitus, dyslipidemia, hypertension, male sex and a sedentary lifestyle.  Hypertension  This is a chronic problem. The current episode started more than 1 year ago. Pertinent negatives include no chest pain, headaches, neck pain, palpitations or shortness of breath. Risk factors for coronary artery disease include dyslipidemia and diabetes mellitus. Past treatments include ACE inhibitors.     Review of Systems  Constitutional: Negative for fatigue and unexpected weight change.  HENT: Negative for dental problem, mouth sores and trouble swallowing.   Eyes: Negative for visual disturbance.  Respiratory: Negative for cough, choking, chest tightness, shortness of breath and wheezing.   Cardiovascular: Negative for chest pain, palpitations and leg swelling.  Gastrointestinal: Negative for abdominal distention, abdominal pain, constipation, diarrhea, nausea and vomiting.  Endocrine: Negative for polydipsia, polyphagia and polyuria.  Genitourinary: Negative for dysuria, flank pain, hematuria and urgency.  Musculoskeletal: Negative for back pain, gait problem, myalgias and neck pain.  Skin: Negative for pallor, rash and wound.  Neurological: Negative for seizures, syncope, weakness, numbness and headaches.  Psychiatric/Behavioral: Negative.  Negative for confusion and dysphoric mood.    Objective:    BP 135/73   Pulse 82    Ht 6\' 1"  (1.854 m)   Wt 153 lb (69.4 kg)   BMI 20.19 kg/m   Wt Readings from Last 3 Encounters:  02/08/17 153 lb (69.4 kg)  11/02/16 152 lb (68.9 kg)  10/19/16 152 lb (68.9 kg)    Physical Exam  Constitutional: He is oriented to person, place, and time. He appears well-developed and well-nourished. He is cooperative. No distress.  HENT:  Head: Normocephalic and atraumatic.  Eyes: EOM are normal.  Neck: Normal range of motion. Neck supple. No tracheal deviation present. No thyromegaly present.  Cardiovascular: Normal rate, S1 normal, S2 normal and normal heart sounds.  Exam reveals no gallop.   No murmur heard. Pulses:      Dorsalis pedis pulses are 2+ on the right side, and 2+ on the left side.       Posterior tibial pulses are 2+ on the right side, and 2+ on the left side.  Pulmonary/Chest: Breath sounds normal. No respiratory distress. He has no wheezes.  Abdominal: Soft. Bowel sounds are normal. He exhibits no distension. There is no tenderness. There is no guarding and no CVA tenderness.  Musculoskeletal: He exhibits no edema.       Right shoulder: He exhibits no swelling and no deformity.  Neurological: He is alert and oriented to person, place, and time.  He has normal strength and normal reflexes. No cranial nerve deficit or sensory deficit. Coordination abnormal. Gait normal.     Skin: Skin is warm and dry. No rash noted. No cyanosis. Nails show no clubbing.  Psychiatric: He has a normal mood and affect. His speech is normal and behavior is normal. Judgment and thought content normal. Cognition and memory are normal.    Results for orders placed or performed in visit on 11/02/16  Renal function panel  Result Value Ref Range   Glucose, Bld 88 65 - 99 mg/dL   BUN 14 7 - 25 mg/dL   Creat 0.92 0.70 - 1.25 mg/dL   BUN/Creatinine Ratio NOT APPLICABLE 6 - 22 (calc)   Sodium 129 (L) 135 - 146 mmol/L   Potassium 5.3 3.5 - 5.3 mmol/L   Chloride 93 (L) 98 - 110 mmol/L   CO2 30  20 - 32 mmol/L   Calcium 9.3 8.6 - 10.3 mg/dL   Phosphorus 4.0 2.1 - 4.3 mg/dL   Albumin 4.1 3.6 - 5.1 g/dL  Hemoglobin A1c  Result Value Ref Range   Hgb A1c MFr Bld 6.2 (H) <5.7 % of total Hgb   Mean Plasma Glucose 131 (calc)   eAG (mmol/L) 7.3 (calc)   Diabetic Labs (most recent): Lab Results  Component Value Date   HGBA1C 6.2 (H) 02/01/2017   HGBA1C 6.4 (H) 10/22/2016   HGBA1C 8.1 (H) 06/20/2016     Assessment & Plan:   1. Uncontrolled type 2 diabetes mellitus without complication, with long-term current use of insulin (HCC) -No gross complications so far, however patient remains at a high risk for more acute and chronic complications of diabetes which include CAD, CVA, CKD, retinopathy, and neuropathy. These are all discussed in detail with the patient.  Patient came with improving blood glucose profile, his A1c Is stable at 6.2%, generally improving from 10.5%.  Recent labs reviewed.  - I have re-counseled the patient on diet management   by adopting a carbohydrate restricted / protein rich  Diet.  -  Suggestion is made for him to avoid simple carbohydrates  from his diet including Cakes, Sweet Desserts / Pastries, Ice Cream, Soda (diet and regular), Sweet Tea, Candies, Chips, Cookies, Store Bought Juices, Alcohol in Excess of  1-2 drinks a day, Artificial Sweeteners, and "Sugar-free" Products. This will help patient to have stable blood glucose profile and potentially avoid unintended weight gain.   - Patient is advised to stick to a routine mealtimes to eat 3 meals  a day and avoid unnecessary snacks ( to snack only to correct hypoglycemia).  - I have approached patient with the following individualized plan to manage diabetes and patient agrees.   - I will lower his  Toujeo to 10 units daily at bedtime , associated with monitoring of blood glucose  daily before breakfast and as needed.   -I will continue his metformin  500 mg by mouth twice a day.  - His c-peptide is  critically low at 0.5, indicating pancreatic exhaustion.  - It is only a matter of time before he will require prandial insulin in addition to his basal insulin. - His GAD antibodies are negative, however does not rule out a possibility of LADA.  He is not a candidate for incretin therapy.  - Patient specific target  for A1c; LDL, HDL, Triglycerides, and  Waist Circumference were discussed in detail.  2) BP/HTN:  Controlled.continue current medications including ACEI/ARB. 3) Lipids/HPL:  continue statins. 4)  Weight/Diet:  CDE consult in progress, exercise, and carbohydrates information provided.  5) Chronic Care/Health Maintenance:  -Patient is on ACEI/ARB and Statin medications and encouraged to continue to follow up with Ophthalmology, Podiatrist at least yearly or according to recommendations, and advised to  stay away from smoking. I have recommended yearly flu vaccine and pneumonia vaccination at least every 5 years; moderate intensity exercise for up to 150 minutes weekly; and  sleep for at least 7 hours a day. - He was recently diagnosed with essential tremors. I advised him to continue follow-up with neurology.  - Time spent with the patient: 25 min, of which >50% was spent in reviewing his sugar logs , discussing his hypo- and hyper-glycemic episodes, reviewing his current and  previous labs and insulin doses and developing a plan to avoid hypo- and hyper-glycemia.    - I advised patient to maintain close follow up with [name], MD for primary care needs.  Follow up plan: -Return in about 4 months (around 06/11/2017) for meter, and logs.  Glade Bettie, MD Phone: [phone]  Fax: [phone]  -  This note was partially dictated with voice recognition software. Similar sounding words can be transcribed inadequately or may not  be corrected upon review.  02/08/2017, 11:11 AM

## 2017-02-21 ENCOUNTER — Other Ambulatory Visit: Payer: Self-pay | Admitting: "Endocrinology

## 2017-03-07 ENCOUNTER — Other Ambulatory Visit: Payer: Self-pay | Admitting: Neurology

## 2017-03-19 NOTE — Progress Notes (Signed)
Subjective:   John Reese was seen in consultation in the movement disorder clinic at the request of John Givens, NP.  His PCP is John Housekeeper, MD.  The evaluation is for tremor.  This patient is accompanied in the office by his spouse who supplements the history. The records that were made available to me were reviewed. Wife thinks that he has had tremor for as long as they have been married, for over 30 years.  His father and paternal GF had tremor.   He estimates that he started seeing Dr. Jannifer Reese 10 years ago but I only have records from the recent years.  He has been on klonopin for many years, 0.34m bid (never been on higher dosage) and is now on propranolol 80 mg bid as well for tremor.  Pt also has long hx of right trigeminal neuralgia and tried lyrica, 1053mtid but it caused asterixis (pt states that he changed because of cost more than SE).  He is on tegretol but had hyponatremia with this in the past.  Is on gabapentin 300 mg bid.    Affected by caffeine:  No. (2 cups of coffee/day) Affected by alcohol: (doesn't drink EtOH) Affected by stress:  Yes.   Affected by fatigue:  No. Spills soup if on spoon:  Yes.   Spills glass of liquid if full:  Yes.   Affects ADL's (tying shoes, brushing teeth, etc):  Yes.   (shaves with electric now because of tremor; brushes teeth with both hands; doesn't wear many shoes with ties; wife has to give insulin shots)  12/15/15 update:  Pt f/u today.   He is accompanied by his wife who supplements the hx.   The records that were made available to me were reviewed.  He had neuropsych testing with John Reese 8/2 and f/u with her on 8/17.  There was no evidence of dementia and felt he would be a good DBS candidate.  Pt expresses desire to proceed with next step.  Expresses desire to do bilateral surgery due to severity of tremor.  He is R hand dominant.  03/12/16 update:  Pt f/u accompanied by his wife who supplements the history.  He is scheduled  for fiducial placement tomorrow.  Comes in today for pre-op video and any last minute questions.  He is on gabapentin 400 mg tid, tegretol - 20029m1 po tid for right sided TN.  It still is providing him pain on the right.  Also on klonopin 0.25 mg bid for tremor.  On propranolol 40 mg - 2 po bid.    04/26/16 update:  The patient presents, accompanied by his wife and daughter who supplement the history.  The patient underwent bilateral VIM DBS on 03/22/2016.  He had his IPG placed on 03/30/2016.  The patient has now recovered well.  He is back on his clonazepam 0.25 mg twice a day and propranolol 40 mg, 2 tablets twice per day.  He forgot to hold those today.  He has had no falls.  He does have a history of trigeminal neuralgia for which he is on gabapentin, 400 mg 3 times per day and Tegretol 200 mg 3 times per day.  Noticing more "jerking" of the arms since gabapentin dose increased for TN but doesn't think that the increased help.  05/08/16 update:  The patient presents today for follow-up, accompanied by his wife and granddaughter who supplements the history.  The patient underwent bilateral VIM DBS on 03/22/2016.  His  device was activated on 04/26/2016.  His wife had called me to state that he was having slurring of speech and falls, but she really thought that was more related to the gabapentin.  Pt states that it wasn't falls but near falls.  Hasn't fallen since hospital stay.  Although I was unsure if the changes were really from gabapentin as his wife thought and felt that it certainly could be related to turning on the DBS, I had him hold the gabapentin.  This made the face pain much worse and they called me back not long thereafter.  However, his speech, confusion/forgetfullness and balance change were much better.  I asked about Lyrica, and it turns out he had tried in the past, but not for long because of cost issues.  Records indicated that perhaps it caused asterixis, but the patient stated that he  changed it because of cost.  I gave him some samples of Lyrica just to get him through today's visit to see how he did and he states that he ended up taking some he had at home and he took 100 mg bid.  He also remains on Tegretol, 200 mg 3 times a day for trigeminal neuralgia.   He is happy with tremor control.  Can give himself his own insulin shot and drink his own coffee now.  Sx's of trigeminal neuralgia are the biggest complaint.  Having such severe face pain that he quit eating for 2 days (the 2 days where he stopped the gabapentin and had not started the Lyrica yet).  07/05/16 update:  Patient returns today, accompanied by his wife who supplements the history.  The patient underwent bilateral VIM DBS on 03/22/2016 with device activation on 04/26/2016.  Last visit, they told me that they thought his speech change was from gabapentin, so we have discontinued that for his trigeminal neuralgia and changed him to Lyrica.  He is currently on Lyrica, 75 mg, 2 po bid. That is working well for the face pain.  He describes occasional myoclonus.   They did call me since last visit and stated that his speech and walking really have been slower.  His wife states that it is intermittent and it is really good today.  I asked them to turn off each site independently for a day, and they did that but noted no change in speech with that.  Wife states that speech better over last few days.  I did ask the patient to drop his propranolol last visit from 80 mg twice per day to 40 mg twice per day.  He would like to stop that and his klonopin.    10/19/16 update:  Patient returns today, accompanied by his wife who supplements the history.  Have him slowly wean off propranolol since last visit.  I made a significant programming change last visit to see if that would help with speech.  We changed contacts that were being used.  The patient states that that was a really good change as his speech is better and he is no longer  stumbling.  He noted L arm and R leg tremor the last few weeks.  He is still on Lyrica, 150 mg twice a day for trigeminal neuralgia.  Tried to go off of tegretol but couldn't.  Well controlled right now.    03/21/17 update: Patient seen today in follow-up for his tremor and his trigeminal neuralgia.  He is accompanied by his wife who supplements the history. The records that were  made available to me were reviewed.   Last visit, I weaned him off of clonazepam.  The patient states that he did well with this.  Tremor has been pretty good.  In regards to his trigeminal neuralgia, he states that he was taking Lyrica, 75 twice per day but he had to go off of it and he is on gabapentin 400 mg bid.  He thinks lyrica worked better and wants to go back but cannot afford it.   He has significant myoclonus.  He doesn't want to change lyrica/gabapentin because of facial pain.   He is on Tegretol 200 mg, 2 tablets twice per day.   Current/Previously tried tremor medications: klonopin, propranolol, lyrica (for TN), gabapentin (for TN)  Current medications that may exacerbate tremor:  n/a  Outside reports reviewed: historical medical records, lab reports and office notes.  Allergies  Allergen Reactions  . No Known Allergies     Outpatient Encounter Medications as of 03/21/2017  Medication Sig  . carbamazepine (TEGRETOL) 200 MG tablet TAKE 1 AND 1/2 TABLETS IN THE MORNING, 1 TABLET AT MIDDAY AND 1 AND 1/2 TABLETS IN THE EVENING (Patient taking differently: 2 tablets in the morning, 2 tablets in the evening)  . CINNAMON PO Take 1,000 mg by mouth daily.  . fish oil-omega-3 fatty acids 1000 MG capsule Take 2 g by mouth daily.  Marland Kitchen gabapentin (NEURONTIN) 400 MG capsule Take 400 mg by mouth 2 (two) times daily.  . metFORMIN (GLUCOPHAGE) 500 MG tablet TAKE 1 TABLET (500 MG TOTAL) BY MOUTH 2 (TWO) TIMES DAILY WITH A MEAL.  . Multiple Vitamin (MULTIVITAMIN) tablet Take 1 tablet by mouth daily.  . pravastatin  (PRAVACHOL) 40 MG tablet Take 40 mg by mouth daily.  . ramipril (ALTACE) 2.5 MG capsule Take 2.5 mg by mouth daily.  [name] 300 UNIT/ML SOPN Inject 10 Units into the skin at bedtime.  Marland Kitchen ULTICARE MINI PEN NEEDLES 31G X 6 MM MISC USE AS DIRECTED AT BEDTIME  . pregabalin (LYRICA) 75 MG capsule Take as directed  . [DISCONTINUED] LYRICA 75 MG capsule TAKE 2 CAPSULES BY MOUTH TWICE A DAY   No facility-administered encounter medications on file as of 03/21/2017.     Past Medical History:  Diagnosis Date  . Auditory disturbance    Decreased auditory acuity  . 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 Diabetes ([place])   . Dyslipidemia   . Tremor   . Trigeminal neuralgia    Right, V2 distribution    Past Surgical History:  Procedure Laterality Date  . FINGER AMPUTATION    . INNER EAR SURGERY Right    Ear drum repair  . PULSE GENERATOR IMPLANT Bilateral 03/30/2016   Procedure: BILATERAL PLACEMENT OF IMPLANTABLE PULSE GENERATOR;  Surgeon: Erline Levine, MD;  Location: [facility];  Service: Neurosurgery;  Laterality: Bilateral;  BILATERAL PLACEMENT OF IMPLANTABLE PULSE GENERATOR  . SUBTHALAMIC STIMULATOR INSERTION Bilateral 03/22/2016   Procedure: BILATERAL DEEP BRAIN STIMULATOR PLACEMENT WITH STARFIX WITH DR. Kaisyn Millea;  Surgeon: Erline Levine, MD;  Location: [facility];  Service: Neurosurgery;  Laterality: Bilateral;    Social History   Socioeconomic History  . Marital status: Married    Spouse name: Not on file  . Number of children: 2  . Years of education: HS  . Highest education level: Not on file  Social Needs  . Financial resource strain: Not on file  . Food insecurity - worry: Not on file  .  Food insecurity - inability: Not on file  . Transportation needs - medical: Not on file  . Transportation needs - non-medical: Not on file  Occupational History  . Occupation: Works in Optometrist: [employer]  Tobacco Use  . Smoking status: Never  Smoker  . Smokeless tobacco: Never Used  Substance and Sexual Activity  . Alcohol use: No  . Drug use: No  . Sexual activity: Not on file  Other Topics Concern  . Not on file  Social History Narrative   Patient is right handed.   Patient drinks 2 cups coffee daily.    Family Status  Relation Name Status  . Father  Deceased at age 60  . Sister  Alive  . Brother  Deceased  . Mother  Alive  . Brother  Alive  . Brother  Alive  . Brother  Alive  . Brother  Alive  . Sister  Alive    Review of Systems A complete 10 system ROS was obtained and was negative apart from what is mentioned.   Objective:   VITALS:   Vitals:   03/21/17 0819  BP: 132/72  Pulse: 86  SpO2: 95%  Weight: 153 lb (69.4 kg)  Height: 6' 1"  (1.854 m)   Gen:  Appears stated age and in NAD. HEENT:  Normocephalic.  Head wounds are healing well. The mucous membranes are moist. The superficial temporal arteries are without ropiness or tenderness. Cardiovascular: Regular rate and rhythm. Lungs: Clear to auscultation bilaterally. Neck: There are no carotid bruits noted bilaterally.  NEUROLOGICAL:  Orientation:  The patient is alert and oriented x 3.   Cranial nerves: There is good facial symmetry.  Speech is fluent and clear. Soft palate rises symmetrically and there is no tongue deviation. Hearing is intact to conversational tone. Tone: Tone is good throughout. Sensation: Sensation is intact to light touch Coordination:  The patient has no dysdiadichokinesia or dysmetria. Motor: Strength is 5/5 in the bilateral upper and lower extremities.  Shoulder shrug is equal bilaterally.  There is no pronator drift.  There are no fasciculations noted. Gait and Station: The patient is able to ambulate without difficulty.   MOVEMENT EXAM: Abnormal movements: There is very little postural tremor prior to programming today.  There is significant myoclonus in the upper extremities bilaterally.  DBS programming was  performed today and described in more detail on a separate programming procedure note.    LABS:    Chemistry      Component Value Date/Time   NA 129 (L) 02/01/2017 0819   NA 138 10/26/2015 1026   K 5.3 02/01/2017 0819   CL 93 (L) 02/01/2017 0819   CO2 30 02/01/2017 0819   BUN 14 02/01/2017 0819   BUN 18 10/26/2015 1026   CREATININE 0.92 02/01/2017 0819      Component Value Date/Time   CALCIUM 9.3 02/01/2017 0819   ALKPHOS 52 10/22/2016 0832   AST 13 10/22/2016 0832   ALT 14 10/22/2016 0832   BILITOT 0.4 10/22/2016 0832   BILITOT <0.2 10/26/2015 1026     No results found for: VITAMINB12  No results found for: TSH   No results found for: ESRSEDRATE, POCTSEDRATE    Assessment/Plan:   1.  Essential Tremor.  -The patient underwent bilateral VIM DBS on 03/22/2016.    -off klonopin.  May need to go back on that for myoclonus if he stays on Lyrica and opts against surgery for trigeminal neuralgia.  2.  R trigeminal  neuragia  -back on gabapentin but doesn't work as well.  Explained that I am concerned that the gabapentin/Lyrica is causing the very significant myoclonus that he has.  He states that he understands this, but just cannot get off of these medications.  The myoclonus is gotten so bad that, even though he no longer has tremor, he is having difficulty holding a cup of coffee because of the myoclonus.  I talked to him about surgery for trigeminal neuralgia.  He is going to think about that.  In the meantime, I gave him samples of Lyrica, 75 mg twice per day.  He usually is on 150 mg twice per day in the winter and backs off to 75 mg twice per day in the summer.  However, right now he is only on 75 mg twice per day and I really would like him to stay at that dosage given the myoclonus.  -will do esr but feel confident myoclonus is likely from gabapentin/Lyrica  -on tegretol.  Tried to wean off of it and sx's got worse and went back on it.  Will stay on 400 mg twice per  day.  3.  Diabetic peripheral neuropathy  -talked about safety  4.   Follow up is anticipated in the next few months, sooner should new neurologic issues arise.  Much greater than 50% of this visit was spent in counseling and coordinating care.  Total face to face time:  25 min, not including DBS programming time.

## 2017-03-21 ENCOUNTER — Encounter: Payer: Self-pay | Admitting: Neurology

## 2017-03-21 ENCOUNTER — Other Ambulatory Visit: Payer: PPO

## 2017-03-21 ENCOUNTER — Ambulatory Visit: Payer: PPO | Admitting: Neurology

## 2017-03-21 VITALS — BP 132/72 | HR 86 | Ht 73.0 in | Wt 153.0 lb

## 2017-03-21 DIAGNOSIS — T887XXA Unspecified adverse effect of drug or medicament, initial encounter: Secondary | ICD-10-CM

## 2017-03-21 DIAGNOSIS — Z5181 Encounter for therapeutic drug level monitoring: Secondary | ICD-10-CM | POA: Diagnosis not present

## 2017-03-21 DIAGNOSIS — G253 Myoclonus: Secondary | ICD-10-CM

## 2017-03-21 DIAGNOSIS — G250 Essential tremor: Secondary | ICD-10-CM | POA: Diagnosis not present

## 2017-03-21 DIAGNOSIS — Z9689 Presence of other specified functional implants: Secondary | ICD-10-CM

## 2017-03-21 LAB — TIQ-NTM

## 2017-03-21 LAB — SEDIMENTATION RATE: SED RATE: 2 mm/h (ref 0–20)

## 2017-03-21 MED ORDER — PREGABALIN 75 MG PO CAPS: ORAL_CAPSULE | ORAL | 0 refills | Status: DC

## 2017-03-21 NOTE — Patient Instructions (Signed)
1. Your provider has requested that you have labwork completed today. Please go to [facility] ([id number]) on the second floor of this building before leaving the office today. You do not need to check in. If you are not called within 15 minutes please check with the front desk.   

## 2017-03-21 NOTE — Procedures (Signed)
DBS Programming was performed.    Total time spent programming was 15 minutes.  Device was confirmed to be on.  Soft start was confirmed to be on.  Impedences were checked and were within normal limits.  Battery was checked and was determined to be functioning normally and not near the end of life.  Final settings were as follows:  Left brain electrode:     1-2+           ; Amplitude  2.8   V   ; Pulse width 90 microseconds;   Frequency   150   Hz.  Right brain electrode:     1-2+          ; Amplitude   2.5  V ;  Pulse width 90  microseconds;  Frequency   150    Hz.

## 2017-03-22 ENCOUNTER — Telehealth: Payer: Self-pay | Admitting: Neurology

## 2017-03-22 NOTE — Telephone Encounter (Signed)
-----   Message from Bland, DO sent at 03/22/2017  7:29 AM EST ----- I have reviewed all lab results which are normal or stable. Please inform the patient.

## 2017-03-22 NOTE — Telephone Encounter (Signed)
Patient wife made aware

## 2017-04-06 ENCOUNTER — Other Ambulatory Visit: Payer: Self-pay | Admitting: "Endocrinology

## 2017-04-11 ENCOUNTER — Other Ambulatory Visit: Payer: Self-pay | Admitting: "Endocrinology

## 2017-05-17 ENCOUNTER — Telehealth: Payer: Self-pay | Admitting: Neurology

## 2017-05-17 ENCOUNTER — Other Ambulatory Visit: Payer: Self-pay | Admitting: Neurology

## 2017-05-17 MED ORDER — PREGABALIN 75 MG PO CAPS: ORAL_CAPSULE | ORAL | 0 refills | Status: DC

## 2017-05-17 NOTE — Telephone Encounter (Signed)
Prescription has been sent to [facility].   Lyrica samples at the front for patient pick up. Patient's wife made aware.

## 2017-05-17 NOTE — Telephone Encounter (Signed)
Pt called and wanted to know if pt's prescription can be transferred and also wanted to pick up some [place]

## 2017-05-27 ENCOUNTER — Encounter (HOSPITAL_COMMUNITY): Payer: Self-pay | Admitting: Neurosurgery

## 2017-06-03 DIAGNOSIS — Z794 Long term (current) use of insulin: Secondary | ICD-10-CM | POA: Diagnosis not present

## 2017-06-03 DIAGNOSIS — E1165 Type 2 diabetes mellitus with hyperglycemia: Secondary | ICD-10-CM | POA: Diagnosis not present

## 2017-06-03 DIAGNOSIS — E782 Mixed hyperlipidemia: Secondary | ICD-10-CM | POA: Diagnosis not present

## 2017-06-04 LAB — COMPLETE METABOLIC PANEL WITH GFR
AG RATIO: 1.8 (calc) (ref 1.0–2.5)
ALBUMIN MSPROF: 4.2 g/dL (ref 3.6–5.1)
ALT: 13 U/L (ref 9–46)
AST: 13 U/L (ref 10–35)
Alkaline phosphatase (APISO): 61 U/L (ref 40–115)
BUN: 17 mg/dL (ref 7–25)
CALCIUM: 9.6 mg/dL (ref 8.6–10.3)
CO2: 32 mmol/L (ref 20–32)
Chloride: 100 mmol/L (ref 98–110)
Creat: 0.88 mg/dL (ref 0.70–1.25)
GFR, EST AFRICAN AMERICAN: 102 mL/min/{1.73_m2} (ref >=60)
GFR, Est Non African American: 88 mL/min/{1.73_m2} (ref >=60)
Globulin: 2.3 g/dL (calc) (ref 1.9–3.7)
Glucose, Bld: 101 mg/dL — ABNORMAL HIGH (ref 65–99)
POTASSIUM: 5.6 mmol/L — AB (ref 3.5–5.3)
Sodium: 137 mmol/L (ref 135–146)
TOTAL PROTEIN: 6.5 g/dL (ref 6.1–8.1)
Total Bilirubin: 0.4 mg/dL (ref 0.2–1.2)

## 2017-06-04 LAB — HEMOGLOBIN A1C
Hgb A1c MFr Bld: 6.4 % of total Hgb — ABNORMAL HIGH (ref <5.7)
MEAN PLASMA GLUCOSE: 137 (calc)
eAG (mmol/L): 7.6 (calc)

## 2017-06-04 LAB — MICROALBUMIN / CREATININE URINE RATIO
Creatinine, Urine: 92 mg/dL (ref 20–320)
MICROALB UR: 0.2 mg/dL
MICROALB/CREAT RATIO: 2 ug/mg{creat} (ref <30)

## 2017-06-04 LAB — VITAMIN D 25 HYDROXY (VIT D DEFICIENCY, FRACTURES): Vit D, 25-Hydroxy: 55 ng/mL (ref 30–100)

## 2017-06-12 ENCOUNTER — Ambulatory Visit: Payer: PPO | Admitting: "Endocrinology

## 2017-06-12 ENCOUNTER — Encounter: Payer: Self-pay | Admitting: "Endocrinology

## 2017-06-12 VITALS — BP 147/80 | HR 84 | Ht 73.0 in | Wt 154.0 lb

## 2017-06-12 DIAGNOSIS — E1165 Type 2 diabetes mellitus with hyperglycemia: Secondary | ICD-10-CM | POA: Diagnosis not present

## 2017-06-12 DIAGNOSIS — I10 Essential (primary) hypertension: Secondary | ICD-10-CM | POA: Diagnosis not present

## 2017-06-12 DIAGNOSIS — E782 Mixed hyperlipidemia: Secondary | ICD-10-CM | POA: Diagnosis not present

## 2017-06-12 DIAGNOSIS — Z794 Long term (current) use of insulin: Secondary | ICD-10-CM | POA: Diagnosis not present

## 2017-06-12 DIAGNOSIS — E875 Hyperkalemia: Secondary | ICD-10-CM | POA: Insufficient documentation

## 2017-06-12 DIAGNOSIS — IMO0001 Reserved for inherently not codable concepts without codable children: Secondary | ICD-10-CM

## 2017-06-12 NOTE — Patient Instructions (Signed)

## 2017-06-12 NOTE — Progress Notes (Signed)
Subjective:    Patient ID: John Reese, male    DOB: 10/26/1950, PCP [name], MD   Past Medical History:  Diagnosis Date  . Auditory disturbance    Decreased auditory acuity  . 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 Diabetes ([place])   . Dyslipidemia   . Tremor   . Trigeminal neuralgia    Right, V2 distribution   Past Surgical History:  Procedure Laterality Date  . FINGER AMPUTATION    . INNER EAR SURGERY Right    Ear drum repair  . PULSE GENERATOR IMPLANT Bilateral 03/30/2016   Procedure: BILATERAL PLACEMENT OF IMPLANTABLE PULSE GENERATOR;  Surgeon: Erline Levine, MD;  Location: [facility];  Service: Neurosurgery;  Laterality: Bilateral;  BILATERAL PLACEMENT OF IMPLANTABLE PULSE GENERATOR  . SUBTHALAMIC STIMULATOR INSERTION Bilateral 03/22/2016   Procedure: BILATERAL DEEP BRAIN STIMULATOR PLACEMENT WITH STARFIX WITH DR. Carles Collet;  Surgeon: Erline Levine, MD;  Location: [facility];  Service: Neurosurgery;  Laterality: Bilateral;   Social History   Socioeconomic History  . Marital status: Married    Spouse name: None  . Number of children: 2  . Years of education: HS  . Highest education level: None  Social Needs  . Financial resource strain: None  . Food insecurity - worry: None  . Food insecurity - inability: None  . Transportation needs - medical: None  . Transportation needs - non-medical: None  Occupational History  . Occupation: Works in Optometrist: [employer]  Tobacco Use  . Smoking status: Never Smoker  . Smokeless tobacco: Never Used  Substance and Sexual Activity  . Alcohol use: No  . Drug use: No  . Sexual activity: None  Other Topics Concern  . None  Social History Narrative   Patient is right handed.   Patient drinks 2 cups coffee daily.   Outpatient Encounter Medications as of 06/12/2017  Medication Sig  . Insulin Glargine (TOUJEO SOLOSTAR [place]) Inject 8 Units into the skin at bedtime.  .  carbamazepine (TEGRETOL) 200 MG tablet Take 2 tablets (400 mg total) by mouth 2 (two) times daily.  Marland Kitchen CINNAMON PO Take 1,000 mg by mouth daily.  . fish oil-omega-3 fatty acids 1000 MG capsule Take 2 g by mouth daily.  Marland Kitchen gabapentin (NEURONTIN) 400 MG capsule Take 400 mg by mouth 2 (two) times daily.  . metFORMIN (GLUCOPHAGE) 500 MG tablet TAKE 1 TABLET (500 MG TOTAL) BY MOUTH 2 (TWO) TIMES DAILY WITH A MEAL.  . Multiple Vitamin (MULTIVITAMIN) tablet Take 1 tablet by mouth daily.  . pravastatin (PRAVACHOL) 40 MG tablet Take 40 mg by mouth daily.  . pregabalin (LYRICA) 75 MG capsule Take as directed  . ramipril (ALTACE) 2.5 MG capsule Take 2.5 mg by mouth daily.  Marland Kitchen ULTICARE MINI PEN NEEDLES 31G X 6 MM MISC USE AS DIRECTED AT BEDTIME  . [DISCONTINUED] TOUJEO SOLOSTAR 300 UNIT/ML SOPN Inject 10 Units into the skin at bedtime.  . [DISCONTINUED] TOUJEO SOLOSTAR 300 UNIT/ML SOPN INJECT 15 UNITS AS DIRECTED AT BEDTIME.   No facility-administered encounter medications on file as of 06/12/2017.    ALLERGIES: Allergies  Allergen Reactions  . No Known Allergies    VACCINATION STATUS:  There is no immunization history on file for this patient.  Diabetes  He presents for his follow-up diabetic visit. He has type 2 diabetes mellitus. Onset time: He was diagnosed at approximate age of 29 years. His disease course has been  improving (He is diagnosed with essential tremors since last visit, following with neurology.). There are no hypoglycemic associated symptoms. Pertinent negatives for hypoglycemia include no confusion, headaches, pallor or seizures. There are no diabetic associated symptoms. Pertinent negatives for diabetes include no chest pain, no fatigue, no polydipsia, no polyphagia, no polyuria and no weakness. There are no hypoglycemic complications. Symptoms are improving. There are no diabetic complications. Risk factors for coronary artery disease include diabetes mellitus, dyslipidemia,  hypertension, male sex and sedentary lifestyle. Current diabetic treatment includes insulin injections and oral agent (monotherapy). He is compliant with treatment most of the time. His weight is stable. He is following a generally healthy diet. When asked about meal planning, he reported none. He has had a previous visit with a dietitian. He participates in exercise intermittently. His home blood glucose trend is decreasing steadily. His breakfast blood glucose range is generally 70-90 mg/dl. His overall blood glucose range is 70-90 mg/dl. An ACE inhibitor/angiotensin II receptor blocker is being taken.  Hyperlipidemia  This is a chronic problem. The current episode started more than 1 year ago. The problem is controlled. Exacerbating diseases include diabetes. Pertinent negatives include no chest pain, myalgias or shortness of breath. Current antihyperlipidemic treatment includes statins. Risk factors for coronary artery disease include diabetes mellitus, dyslipidemia, hypertension, male sex and a sedentary lifestyle.  Hypertension  This is a chronic problem. The current episode started more than 1 year ago. The problem is uncontrolled. Pertinent negatives include no chest pain, headaches, neck pain, palpitations or shortness of breath. Risk factors for coronary artery disease include dyslipidemia and diabetes mellitus. Past treatments include ACE inhibitors.    Review of Systems  Constitutional: Negative for fatigue and unexpected weight change.  HENT: Negative for dental problem, mouth sores and trouble swallowing.   Eyes: Negative for visual disturbance.  Respiratory: Negative for cough, choking, chest tightness, shortness of breath and wheezing.   Cardiovascular: Negative for chest pain, palpitations and leg swelling.  Gastrointestinal: Negative for abdominal distention, abdominal pain, constipation, diarrhea, nausea and vomiting.  Endocrine: Negative for polydipsia, polyphagia and polyuria.   Genitourinary: Negative for dysuria, flank pain, hematuria and urgency.  Musculoskeletal: Negative for back pain, gait problem, myalgias and neck pain.  Skin: Negative for pallor, rash and wound.  Neurological: Negative for seizures, syncope, weakness, numbness and headaches.  Psychiatric/Behavioral: Negative.  Negative for confusion and dysphoric mood.    Objective:    BP (!) 147/80   Pulse 84   Ht 6\' 1"  (1.854 m)   Wt 154 lb (69.9 kg)   BMI 20.32 kg/m   Wt Readings from Last 3 Encounters:  06/12/17 154 lb (69.9 kg)  03/21/17 153 lb (69.4 kg)  02/08/17 153 lb (69.4 kg)    Physical Exam  Constitutional: He is oriented to person, place, and time. He appears well-developed and well-nourished. He is cooperative. No distress.  HENT:  Head: Normocephalic and atraumatic.  Eyes: EOM are normal.  Neck: Normal range of motion. Neck supple. No tracheal deviation present. No thyromegaly present.  Cardiovascular: Normal rate, S1 normal, S2 normal and normal heart sounds. Exam reveals no gallop.  No murmur heard. Pulses:      Dorsalis pedis pulses are 2+ on the right side, and 2+ on the left side.       Posterior tibial pulses are 2+ on the right side, and 2+ on the left side.  Pulmonary/Chest: Breath sounds normal. No respiratory distress. He has no wheezes.  Abdominal: Soft. Bowel sounds are  normal. He exhibits no distension. There is no tenderness. There is no guarding and no CVA tenderness.  Musculoskeletal: He exhibits no edema.       Right shoulder: He exhibits no swelling and no deformity.  Neurological: He is alert and oriented to person, place, and time. He has normal strength and normal reflexes. No cranial nerve deficit or sensory deficit. Coordination abnormal. Gait normal.     Skin: Skin is warm and dry. No rash noted. No cyanosis. Nails show no clubbing.  Psychiatric: He has a normal mood and affect. His speech is normal and behavior is normal. Judgment and thought content  normal. Cognition and memory are normal.    Results for orders placed or performed in visit on 03/21/17  Sedimentation rate  Result Value Ref Range   Sed Rate 2 0 - 20 mm/h  TIQ-NTM  Result Value Ref Range   QUESTION/PROBLEM:     SPECIMEN(S) RECEIVED: SST    Diabetic Labs (most recent): Lab Results  Component Value Date   HGBA1C 6.4 (H) 06/03/2017   HGBA1C 6.2 (H) 02/01/2017   HGBA1C 6.4 (H) 10/22/2016   Lipid Panel     Component Value Date/Time   CHOL 175 06/27/2015 0816   TRIG 99 06/27/2015 0816   HDL 42 06/27/2015 0816   CHOLHDL 4.2 06/27/2015 0816   VLDL 20 06/27/2015 0816   LDLCALC 113 06/27/2015 0816    Assessment & Plan:   1. Uncontrolled type 2 diabetes mellitus without complication, with long-term current use of insulin (HCC) -No gross complications from his diabetes so far, however patient remains at a high risk for more acute and chronic complications of diabetes which include CAD, CVA, CKD, retinopathy, and neuropathy. These are all discussed in detail with the patient.  Patient came with his logs showing 10% of his fasting glycemic profile is too tight, below 70 mg/dL.  His most recent A1c remains stable at 6.3%, slowly improving from 10.5%.    Recent labs reviewed, showing hyperkalemia of 5.6.  - I have re-counseled the patient on diet management   by adopting a carbohydrate restricted / protein rich  Diet.  -  Suggestion is made for him to avoid simple carbohydrates  from his diet including Cakes, Sweet Desserts / Pastries, Ice Cream, Soda (diet and regular), Sweet Tea, Candies, Chips, Cookies, Store Bought Juices, Alcohol in Excess of  1-2 drinks a day, Artificial Sweeteners, and "Sugar-free" Products. This will help patient to have stable blood glucose profile and potentially avoid unintended weight gain.  - Patient is advised to stick to a routine mealtimes to eat 3 meals  a day and avoid unnecessary snacks ( to snack only to correct hypoglycemia).  - I  have approached patient with the following individualized plan to manage diabetes and patient agrees.   - I will lower his Toujeo to 8 units daily at bedtime , associated with monitoring of blood glucose  daily before breakfast and as needed.   -I will continue his metformin  500 mg by mouth twice a day.  - His c-peptide is critically low at 0.5, indicating pancreatic exhaustion.  - It is only a matter of time before he will require prandial insulin in addition to his basal insulin. - His GAD antibodies are negative, however does not rule out a possibility of LADA.  He is not a candidate for incretin therapy. -He is not on any potassium supplements.  He takes daily multivitamins, not sure how much potassium that has.   -  He will call back with information.  He is advised to cut back on his bananas and oranges consumptions and will have repeat CMP to follow-up on his hyperkalemia in 1 month.  - Patient specific target  for A1c; LDL, HDL, Triglycerides, and  Waist Circumference were discussed in detail.  2) BP/HTN: His blood pressure is not controlled to target this morning, he is advised to continue current medications including ACEI/ARB. 3) Lipids/HPL: Uncontrolled, LDL at 113, he is advised to continue pravastatin. 4)  Weight/Diet: CDE consult in progress, exercise, and carbohydrates information provided.  5) Chronic Care/Health Maintenance:  -Patient is on ACEI/ARB and Statin medications and encouraged to continue to follow up with Ophthalmology, Podiatrist at least yearly or according to recommendations, and advised to  stay away from smoking. I have recommended yearly flu vaccine and pneumonia vaccination at least every 5 years; moderate intensity exercise for up to 150 minutes weekly; and  sleep for at least 7 hours a day. - He was recently diagnosed with essential tremors. I advised him to continue follow-up with neurology.  - Time spent with the patient: 25 min, of which >50% was spent  in reviewing his blood glucose logs , discussing his hypo- and hyper-glycemic episodes, reviewing his current and  previous labs and insulin doses and developing a plan to avoid hypo- and hyper-glycemia. Please refer to Patient Instructions for Blood Glucose Monitoring and Insulin/Medications Dosing Guide"  in media tab for additional information.  - I advised patient to maintain close follow up with [name], MD for primary care needs.  Follow up plan: -Return in about 3 months (around 09/09/2017) for meter, and logs.  Glade Johm, MD Phone: [phone]  Fax: [phone]  -  This note was partially dictated with voice recognition software. Similar sounding words can be transcribed inadequately or may not  be corrected upon review.  06/12/2017, 12:10 PM

## 2017-07-02 DIAGNOSIS — Z0000 Encounter for general adult medical examination without abnormal findings: Secondary | ICD-10-CM | POA: Diagnosis not present

## 2017-07-04 ENCOUNTER — Other Ambulatory Visit: Payer: Self-pay | Admitting: "Endocrinology

## 2017-07-10 DIAGNOSIS — E1165 Type 2 diabetes mellitus with hyperglycemia: Secondary | ICD-10-CM | POA: Diagnosis not present

## 2017-07-10 DIAGNOSIS — Z794 Long term (current) use of insulin: Secondary | ICD-10-CM | POA: Diagnosis not present

## 2017-07-11 LAB — COMPLETE METABOLIC PANEL WITH GFR
AG RATIO: 1.9 (calc) (ref 1.0–2.5)
ALKALINE PHOSPHATASE (APISO): 60 U/L (ref 40–115)
ALT: 12 U/L (ref 9–46)
AST: 13 U/L (ref 10–35)
Albumin: 4 g/dL (ref 3.6–5.1)
BILIRUBIN TOTAL: 0.4 mg/dL (ref 0.2–1.2)
BUN: 21 mg/dL (ref 7–25)
CHLORIDE: 94 mmol/L — AB (ref 98–110)
CO2: 28 mmol/L (ref 20–32)
Calcium: 9 mg/dL (ref 8.6–10.3)
Creat: 1.01 mg/dL (ref 0.70–1.25)
GFR, Est African American: 88 mL/min/{1.73_m2} (ref >=60)
GFR, Est Non African American: 76 mL/min/{1.73_m2} (ref >=60)
GLUCOSE: 135 mg/dL (ref 65–139)
Globulin: 2.1 g/dL (calc) (ref 1.9–3.7)
POTASSIUM: 5.1 mmol/L (ref 3.5–5.3)
Sodium: 127 mmol/L — ABNORMAL LOW (ref 135–146)
Total Protein: 6.1 g/dL (ref 6.1–8.1)

## 2017-07-11 LAB — HEMOGLOBIN A1C
HEMOGLOBIN A1C: 6.3 %{Hb} — AB (ref <5.7)
MEAN PLASMA GLUCOSE: 134 (calc)
eAG (mmol/L): 7.4 (calc)

## 2017-07-15 ENCOUNTER — Telehealth: Payer: Self-pay | Admitting: Neurology

## 2017-07-15 MED ORDER — PREGABALIN 75 MG PO CAPS: ORAL_CAPSULE | ORAL | 0 refills | Status: DC

## 2017-07-15 NOTE — Telephone Encounter (Signed)
LMOM that samples are at the front for pick up.

## 2017-07-15 NOTE — Telephone Encounter (Signed)
Patient is needing to see if he can pick up [place] Samples? Please Call. Thanks

## 2017-08-15 ENCOUNTER — Telehealth: Payer: Self-pay | Admitting: Neurology

## 2017-08-15 NOTE — Telephone Encounter (Signed)
Patient made aware. Appt made for Monday.

## 2017-08-15 NOTE — Telephone Encounter (Signed)
Pt's spouse Vaughan Basta called and left a VM message saying pt is falling and just doesn't feel good and wanted to know if Dr [name] should see him sooner CB# [phone]

## 2017-08-15 NOTE — Telephone Encounter (Signed)
If it was from the DBS it wouldn't come and go.  I'm happy to look at device if they would like but doubtful this is issue.  Can put him in NP slot on Monday if its not full and he wants to come in

## 2017-08-15 NOTE — Telephone Encounter (Signed)
Spoke with patient's wife.   She states patient has had issues with feeling off balance since DBS adjustment in November. She states it isn't constant. Comes and goes. Worse in the last couple of weeks. He has had two falls. No LOC/no injury/didn't hit head. She states he just tells her he "feels funny" and his legs get weak and he falls. She states his blood pressure has been good. Blood sugars are good. No recent illnesses. No change in medicines.   They have follow up scheduled in a month. They want to know if he should come in sooner. Please advise.

## 2017-08-16 NOTE — Progress Notes (Signed)
Subjective:   John Reese was seen in consultation in the movement disorder clinic at the request of John Givens, NP.  His PCP is John Housekeeper, MD.  The evaluation is for tremor.  This patient is accompanied in the office by his spouse who supplements the history. The records that were made available to me were reviewed. Wife thinks that he has had tremor for as long as they have been married, for over 30 years.  His father and paternal GF had tremor.   He estimates that he started seeing Dr. Jannifer Reese 10 years ago but I only have records from the recent years.  He has been on klonopin for many years, 0.34m bid (never been on higher dosage) and is now on propranolol 80 mg bid as well for tremor.  Pt also has long hx of right trigeminal neuralgia and tried lyrica, 1053mtid but it caused asterixis (pt states that he changed because of cost more than SE).  He is on tegretol but had hyponatremia with this in the past.  Is on gabapentin 300 mg bid.    Affected by caffeine:  No. (2 cups of coffee/day) Affected by alcohol: (doesn't drink EtOH) Affected by stress:  Yes.   Affected by fatigue:  No. Spills soup if on spoon:  Yes.   Spills glass of liquid if full:  Yes.   Affects ADL's (tying shoes, brushing teeth, etc):  Yes.   (shaves with electric now because of tremor; brushes teeth with both hands; doesn't wear many shoes with ties; wife has to give insulin shots)  12/15/15 update:  Pt f/u today.   He is accompanied by his wife who supplements the hx.   The records that were made available to me were reviewed.  He had neuropsych testing with John Reese 8/2 and f/u with her on 8/17.  There was no evidence of dementia and felt he would be a good DBS candidate.  Pt expresses desire to proceed with next step.  Expresses desire to do bilateral surgery due to severity of tremor.  He is R hand dominant.  03/12/16 update:  Pt f/u accompanied by his wife who supplements the history.  He is scheduled  for fiducial placement tomorrow.  Comes in today for pre-op video and any last minute questions.  He is on gabapentin 400 mg tid, tegretol - 20029m1 po tid for right sided TN.  It still is providing him pain on the right.  Also on klonopin 0.25 mg bid for tremor.  On propranolol 40 mg - 2 po bid.    04/26/16 update:  The patient presents, accompanied by his wife and daughter who supplement the history.  The patient underwent bilateral VIM DBS on 03/22/2016.  He had his IPG placed on 03/30/2016.  The patient has now recovered well.  He is back on his clonazepam 0.25 mg twice a day and propranolol 40 mg, 2 tablets twice per day.  He forgot to hold those today.  He has had no falls.  He does have a history of trigeminal neuralgia for which he is on gabapentin, 400 mg 3 times per day and Tegretol 200 mg 3 times per day.  Noticing more "jerking" of the arms since gabapentin dose increased for TN but doesn't think that the increased help.  05/08/16 update:  The patient presents today for follow-up, accompanied by his wife and granddaughter who supplements the history.  The patient underwent bilateral VIM DBS on 03/22/2016.  His  device was activated on 04/26/2016.  His wife had called me to state that he was having slurring of speech and falls, but she really thought that was more related to the gabapentin.  Pt states that it wasn't falls but near falls.  Hasn't fallen since hospital stay.  Although I was unsure if the changes were really from gabapentin as his wife thought and felt that it certainly could be related to turning on the DBS, I had him hold the gabapentin.  This made the face pain much worse and they called me back not long thereafter.  However, his speech, confusion/forgetfullness and balance change were much better.  I asked about Lyrica, and it turns out he had tried in the past, but not for long because of cost issues.  Records indicated that perhaps it caused asterixis, but the patient stated that he  changed it because of cost.  I gave him some samples of Lyrica just to get him through today's visit to see how he did and he states that he ended up taking some he had at home and he took 100 mg bid.  He also remains on Tegretol, 200 mg 3 times a day for trigeminal neuralgia.   He is happy with tremor control.  Can give himself his own insulin shot and drink his own coffee now.  Sx's of trigeminal neuralgia are the biggest complaint.  Having such severe face pain that he quit eating for 2 days (the 2 days where he stopped the gabapentin and had not started the Lyrica yet).  07/05/16 update:  Patient returns today, accompanied by his wife who supplements the history.  The patient underwent bilateral VIM DBS on 03/22/2016 with device activation on 04/26/2016.  Last visit, they told me that they thought his speech change was from gabapentin, so we have discontinued that for his trigeminal neuralgia and changed him to Lyrica.  He is currently on Lyrica, 75 mg, 2 po bid. That is working well for the face pain.  He describes occasional myoclonus.   They did call me since last visit and stated that his speech and walking really have been slower.  His wife states that it is intermittent and it is really good today.  I asked them to turn off each site independently for a day, and they did that but noted no change in speech with that.  Wife states that speech better over last few days.  I did ask the patient to drop his propranolol last visit from 80 mg twice per day to 40 mg twice per day.  He would like to stop that and his klonopin.    10/19/16 update:  Patient returns today, accompanied by his wife who supplements the history.  Have him slowly wean off propranolol since last visit.  I made a significant programming change last visit to see if that would help with speech.  We changed contacts that were being used.  The patient states that that was a really good change as his speech is better and he is no longer  stumbling.  He noted L arm and R leg tremor the last few weeks.  He is still on Lyrica, 150 mg twice a day for trigeminal neuralgia.  Tried to go off of tegretol but couldn't.  Well controlled right now.    03/21/17 update: Patient seen today in follow-up for his tremor and his trigeminal neuralgia.  He is accompanied by his wife who supplements the history. The records that were  made available to me were reviewed.   Last visit, I weaned him off of clonazepam.  The patient states that he did well with this.  Tremor has been pretty good.  In regards to his trigeminal neuralgia, he states that he was taking Lyrica, 75 twice per day but he had to go off of it and he is on gabapentin 400 mg bid.  He thinks lyrica worked better and wants to go back but cannot afford it.   He has significant myoclonus.  He doesn't want to change lyrica/gabapentin because of facial pain.   He is on Tegretol 200 mg, 2 tablets twice per day.   08/19/17 update:  Pt seen today in f/u.  Last visit, discussed that gabapentin could be causing the myoclonus but pt did not feel he could get off of it.  We did change it back to lyrica last visit and he is currently taking 75 mg, 2 po bid.  Still having myoclonus. Still on tegretol 200mg , 2 po bid.  He was worked in today primarily because of 3 falls, the last on Friday.   Wife thought balance issues since last DBS programming and patient states same today.  He has had this issue in the past but turning off DBS did not change it signficantly.    The records that were made available to me were reviewed.  Labs done and repeated for hyperkalemia.  Now with hyponatremia but pt states he was never made aware of this.  Asks about what he can do to help him gain weight.  Current/Previously tried tremor medications: klonopin, propranolol, lyrica (for TN), gabapentin (for TN)  Current medications that may exacerbate tremor:  n/a  Outside reports reviewed: historical medical records, lab reports and  office notes.  Allergies  Allergen Reactions  . No Known Allergies     Outpatient Encounter Medications as of 08/19/2017  Medication Sig  . carbamazepine (TEGRETOL) 200 MG tablet Take 2 tablets (400 mg total) by mouth 2 (two) times daily.  Marland Kitchen CINNAMON PO Take 1,000 mg by mouth daily.  . fish oil-omega-3 fatty acids 1000 MG capsule Take 2 g by mouth daily.  . Insulin Glargine (TOUJEO SOLOSTAR [place]) Inject 8 Units into the skin at bedtime.  . metFORMIN (GLUCOPHAGE) 500 MG tablet TAKE 1 TABLET (500 MG TOTAL) BY MOUTH 2 (TWO) TIMES DAILY WITH A MEAL.  . Multiple Vitamin (MULTIVITAMIN) tablet Take 1 tablet by mouth daily.  . pravastatin (PRAVACHOL) 40 MG tablet Take 40 mg by mouth daily.  . ramipril (ALTACE) 2.5 MG capsule Take 2.5 mg by mouth daily.  Marland Kitchen ULTICARE MINI PEN NEEDLES 31G X 6 MM MISC USE AS DIRECTED AT BEDTIME  . [DISCONTINUED] pregabalin (LYRICA) 75 MG capsule Take as directed (Patient taking differently: Take 150 mg by mouth 2 (two) times daily. Take as directed)  . pregabalin (LYRICA) 50 MG capsule Take 3 capsules (150 mg total) by mouth 2 (two) times daily.  . [DISCONTINUED] gabapentin (NEURONTIN) 400 MG capsule Take 400 mg by mouth 2 (two) times daily.   No facility-administered encounter medications on file as of 08/19/2017.     Past Medical History:  Diagnosis Date  . Auditory disturbance    Decreased auditory acuity  . 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 Diabetes ([place])   . Dyslipidemia   . Tremor   . Trigeminal neuralgia    Right, V2 distribution    Past Surgical History:  Procedure Laterality  Date  . FINGER AMPUTATION    . INNER EAR SURGERY Right    Ear drum repair  . PULSE GENERATOR IMPLANT Bilateral 03/30/2016   Procedure: BILATERAL PLACEMENT OF IMPLANTABLE PULSE GENERATOR;  Surgeon: Erline Levine, MD;  Location: [place];  Service: Neurosurgery;  Laterality: Bilateral;  BILATERAL PLACEMENT OF IMPLANTABLE PULSE GENERATOR    . SUBTHALAMIC STIMULATOR INSERTION Bilateral 03/22/2016   Procedure: BILATERAL DEEP BRAIN STIMULATOR PLACEMENT WITH STARFIX WITH DR. Carles Collet;  Surgeon: Erline Levine, MD;  Location: [place];  Service: Neurosurgery;  Laterality: Bilateral;    Social History   Socioeconomic History  . Marital status: Married    Spouse name: Not on file  . Number of children: 2  . Years of education: HS  . Highest education level: Not on file  Occupational History  . Occupation: Works in Optometrist: [employer]  . Financial resource strain: Not on file  . Food insecurity:    Worry: Not on file    Inability: Not on file  . Transportation needs:    Medical: Not on file    Non-medical: Not on file  Tobacco Use  . Smoking status: Never Smoker  . Smokeless tobacco: Never Used  Substance and Sexual Activity  . Alcohol use: No  . Drug use: No  . Sexual activity: Not on file  Lifestyle  . Physical activity:    Days per week: Not on file    Minutes per session: Not on file  . Stress: Not on file  Relationships  . Social connections:    Talks on phone: Not on file    Gets together: Not on file    Attends religious service: Not on file    Active member of club or organization: Not on file    Attends meetings of clubs or organizations: Not on file    Relationship status: Not on file  . Intimate partner violence:    Fear of current or ex partner: Not on file    Emotionally abused: Not on file    Physically abused: Not on file    Forced sexual activity: Not on file  Other Topics Concern  . Not on file  Social History Narrative   Patient is right handed.   Patient drinks 2 cups coffee daily.    Family Status  Relation Name Status  . Father  Deceased at age 75  . Sister  Alive  . Brother  Deceased  . Mother  Alive  . Brother  Alive  . Brother  Alive  . Brother  Alive  . Brother  Alive  . Sister  Alive    Review of Systems A complete 10 system ROS was obtained and was  negative apart from what is mentioned.   Objective:   VITALS:   Vitals:   08/19/17 1002  BP: (!) 144/82  Pulse: 80  SpO2: 98%  Weight: 146 lb (66.2 kg)  Height: 6\' 1"  (1.854 m)   Wt Readings from Last 3 Encounters:  08/19/17 146 lb (66.2 kg)  06/12/17 154 lb (69.9 kg)  03/21/17 153 lb (69.4 kg)     Gen:  Appears stated age and in NAD. HEENT:  Normocephalic.  Atraumatic.  The mucous membranes are moist. The superficial temporal arteries are without ropiness or tenderness. Cardiovascular: Regular rate and rhythm. Lungs: Clear to auscultation bilaterally. Neck: There are no carotid bruits noted bilaterally.  NEUROLOGICAL:  Orientation:  The patient is alert and oriented x 3.  Cranial nerves: There is good facial symmetry.  Speech is fluent and clear. Soft palate rises symmetrically and there is no tongue deviation. Hearing is intact to conversational tone. Tone: Tone is good throughout. Sensation: Sensation is intact to light touch Coordination:  The patient has no dysdiadichokinesia or dysmetria. Motor: Strength is 5/5 in the bilateral upper and lower extremities.  Shoulder shrug is equal bilaterally.  There is no pronator drift.  There are no fasciculations noted. Gait and Station: The patient is wide-based and mildly unsteady.  MOVEMENT EXAM: Abnormal movements: There is no rest or intention tremor.  There is occasional myoclonus, but it is much more rare than it was last visit.  DBS programming was performed today and described in more detail on a separate programming procedure note.    LABS:    Chemistry      Component Value Date/Time   NA 127 (L) 07/10/2017 0832   NA 138 10/26/2015 1026   K 5.1 07/10/2017 0832   CL 94 (L) 07/10/2017 0832   CO2 28 07/10/2017 0832   BUN 21 07/10/2017 0832   BUN 18 10/26/2015 1026   CREATININE 1.01 07/10/2017 0832      Component Value Date/Time   CALCIUM 9.0 07/10/2017 0832   ALKPHOS 52 10/22/2016 0832   AST 13 07/10/2017  0832   ALT 12 07/10/2017 0832   BILITOT 0.4 07/10/2017 0832   BILITOT <0.2 10/26/2015 1026     No results found for: VITAMINB12  No results found for: TSH    Lab Results  Component Value Date   ESRSEDRATE 2 03/21/2017      Assessment/Plan:   1.  Essential Tremor.  -The patient underwent bilateral VIM DBS on 03/22/2016.    -I reprogrammed his DBS today as the patient felt that the minor changes made last visit may balance worse.  I sent him back to prior settings.  2.  R trigeminal neuragia  -He is on Tegretol, 400 mg twice per day.  He is hyponatremic now, likely from this.  I am going to decrease the Tegretol to 200 mg per day.  He is very nervous about changes because of past experience of facial pain, but admits that facial pain is much better in the summer and has been good lately.   -Recheck carbamazepine level in 1 month.  -Currently on Lyrica, 75 mg twice per day.  Myoclonus is much better than when he was on dependent, but he continues to have some.  I do not want to change this because I am changing his Tegretol, but if he does well with change in Tegretol and sodium rebounds, I may try and drop the dose of this to see if we can help myoclonus.    3.  Diabetic peripheral neuropathy  -talked about safety  4.   falls  -suspect multifactorial and doubt the device since it comes and goes.  However, he states that balance worse after programming device last visit.  I didn't change it much last visit but did return programming settings to the settings he had previously  -he does have PN  -wonder if meds contributing esp since he is now hyponatremic, likely from carbamazepine.    5.  Weight loss  -He is to follow-up with his primary care physician.  He did ask me what he can take for protein supplements.  Glucerna was recommended given his diabetic nature, but also told him to follow-up with endocrinology.  He has an appointment with his endocrinologist  within a month.  6.   Follow up is anticipated in the next few months, sooner should new neurologic issues arise.  Much greater than 50% of this visit was spent in counseling and coordinating care.  Total face to face time:  25 min not including DBS time

## 2017-08-19 ENCOUNTER — Ambulatory Visit: Payer: PPO | Admitting: Neurology

## 2017-08-19 ENCOUNTER — Encounter: Payer: Self-pay | Admitting: Neurology

## 2017-08-19 VITALS — BP 144/82 | HR 80 | Ht 73.0 in | Wt 146.0 lb

## 2017-08-19 DIAGNOSIS — Z9689 Presence of other specified functional implants: Secondary | ICD-10-CM

## 2017-08-19 DIAGNOSIS — E871 Hypo-osmolality and hyponatremia: Secondary | ICD-10-CM | POA: Diagnosis not present

## 2017-08-19 DIAGNOSIS — W19XXXA Unspecified fall, initial encounter: Secondary | ICD-10-CM | POA: Diagnosis not present

## 2017-08-19 DIAGNOSIS — G250 Essential tremor: Secondary | ICD-10-CM

## 2017-08-19 DIAGNOSIS — R634 Abnormal weight loss: Secondary | ICD-10-CM | POA: Diagnosis not present

## 2017-08-19 MED ORDER — PREGABALIN 50 MG PO CAPS: 150.0000 mg | ORAL_CAPSULE | Freq: Two times a day (BID) | ORAL | 0 refills | Status: DC

## 2017-08-19 NOTE — Procedures (Signed)
DBS Programming was performed.    Total time spent programming was 13 minutes.  Device was confirmed to be on.  Soft start was confirmed to be on.  Impedences were checked and were within normal limits.  Battery was checked and was determined to be functioning normally and not near the end of life (2.97 on both sides).  Final settings were as follows:  Left brain electrode:     1-2+           ; Amplitude  2.7   V   ; Pulse width 90 microseconds (tried to lower to 60 but had more tremor);   Frequency   150   Hz.   Right brain electrode:     1-2+          ; Amplitude   2.4  V ;  Pulse width 90  microseconds;  Frequency   150    Hz.

## 2017-08-19 NOTE — Patient Instructions (Signed)
1.  Try glucerna for protein  2.  Decrease tegretol (carbamazepine) to 200 mg - 1 tablet twice per day  3.  [facility] (Dr. Birdie Riddle and Dr. Jonni Sanger) [address] [place], [place], [place] [address] Phone: [phone]  4.  Have your blood work done in a month and call me when you have this completed.  I may change your Lyrica at that time

## 2017-08-20 ENCOUNTER — Telehealth: Payer: Self-pay | Admitting: Neurology

## 2017-08-20 NOTE — Telephone Encounter (Signed)
LMOM making patient/wife aware of new appt date/time. Asked them to call and confirm prior to me cancelling May appt. Awaiting call back.

## 2017-08-20 NOTE — Telephone Encounter (Signed)
Patient's wife was asking when to have his Sodium checked? Thanks

## 2017-08-20 NOTE — Telephone Encounter (Signed)
-----   Message from [place], DO sent at 08/19/2017 12:06 PM EDT ----- I don't know when f/u made but need to work him back in within about 10 weeks.  Okay for NP, 60 min slot.  Just make it a movement slot

## 2017-08-20 NOTE — Telephone Encounter (Signed)
Discussed with patient's wife. He will have drawn in a couple weeks at Endocrinology.

## 2017-08-20 NOTE — Telephone Encounter (Signed)
Patient confirmed.  May appt cancelled.

## 2017-09-03 ENCOUNTER — Other Ambulatory Visit: Payer: Self-pay | Admitting: "Endocrinology

## 2017-09-06 ENCOUNTER — Other Ambulatory Visit: Payer: Self-pay

## 2017-09-06 ENCOUNTER — Encounter (HOSPITAL_COMMUNITY): Payer: Self-pay | Admitting: Emergency Medicine

## 2017-09-06 ENCOUNTER — Observation Stay (HOSPITAL_COMMUNITY): Payer: No Typology Code available for payment source

## 2017-09-06 ENCOUNTER — Observation Stay (HOSPITAL_COMMUNITY)
Admission: EM | Admit: 2017-09-06 | Discharge: 2017-09-07 | Disposition: A | Discharge status: Home / Self Care | Payer: No Typology Code available for payment source | Attending: Surgery | Admitting: Surgery

## 2017-09-06 ENCOUNTER — Emergency Department (HOSPITAL_COMMUNITY): Payer: No Typology Code available for payment source

## 2017-09-06 DIAGNOSIS — S2222XA Fracture of body of sternum, initial encounter for closed fracture: Secondary | ICD-10-CM | POA: Diagnosis not present

## 2017-09-06 DIAGNOSIS — S80212A Abrasion, left knee, initial encounter: Secondary | ICD-10-CM | POA: Diagnosis not present

## 2017-09-06 DIAGNOSIS — E782 Mixed hyperlipidemia: Secondary | ICD-10-CM | POA: Diagnosis not present

## 2017-09-06 DIAGNOSIS — S0990XA Unspecified injury of head, initial encounter: Secondary | ICD-10-CM | POA: Diagnosis not present

## 2017-09-06 DIAGNOSIS — I10 Essential (primary) hypertension: Secondary | ICD-10-CM | POA: Insufficient documentation

## 2017-09-06 DIAGNOSIS — E875 Hyperkalemia: Secondary | ICD-10-CM | POA: Diagnosis not present

## 2017-09-06 DIAGNOSIS — S00212A Abrasion of left eyelid and periocular area, initial encounter: Secondary | ICD-10-CM | POA: Diagnosis not present

## 2017-09-06 DIAGNOSIS — S2241XA Multiple fractures of ribs, right side, initial encounter for closed fracture: Secondary | ICD-10-CM | POA: Diagnosis not present

## 2017-09-06 DIAGNOSIS — Z9689 Presence of other specified functional implants: Secondary | ICD-10-CM | POA: Diagnosis not present

## 2017-09-06 DIAGNOSIS — S2243XA Multiple fractures of ribs, bilateral, initial encounter for closed fracture: Secondary | ICD-10-CM | POA: Diagnosis not present

## 2017-09-06 DIAGNOSIS — Z89022 Acquired absence of left finger(s): Secondary | ICD-10-CM | POA: Insufficient documentation

## 2017-09-06 DIAGNOSIS — E119 Type 2 diabetes mellitus without complications: Secondary | ICD-10-CM | POA: Insufficient documentation

## 2017-09-06 DIAGNOSIS — S12691A Other nondisplaced fracture of seventh cervical vertebra, initial encounter for closed fracture: Secondary | ICD-10-CM

## 2017-09-06 DIAGNOSIS — G44309 Post-traumatic headache, unspecified, not intractable: Secondary | ICD-10-CM | POA: Diagnosis not present

## 2017-09-06 DIAGNOSIS — Z79899 Other long term (current) drug therapy: Secondary | ICD-10-CM | POA: Diagnosis not present

## 2017-09-06 DIAGNOSIS — Y92410 Unspecified street and highway as the place of occurrence of the external cause: Secondary | ICD-10-CM | POA: Insufficient documentation

## 2017-09-06 DIAGNOSIS — S12600A Unspecified displaced fracture of seventh cervical vertebra, initial encounter for closed fracture: Secondary | ICD-10-CM | POA: Insufficient documentation

## 2017-09-06 DIAGNOSIS — Z7982 Long term (current) use of aspirin: Secondary | ICD-10-CM | POA: Diagnosis not present

## 2017-09-06 DIAGNOSIS — S12601A Unspecified nondisplaced fracture of seventh cervical vertebra, initial encounter for closed fracture: Secondary | ICD-10-CM | POA: Diagnosis not present

## 2017-09-06 DIAGNOSIS — S3991XA Unspecified injury of abdomen, initial encounter: Secondary | ICD-10-CM | POA: Diagnosis not present

## 2017-09-06 DIAGNOSIS — S199XXA Unspecified injury of neck, initial encounter: Secondary | ICD-10-CM | POA: Diagnosis not present

## 2017-09-06 DIAGNOSIS — S2242XA Multiple fractures of ribs, left side, initial encounter for closed fracture: Secondary | ICD-10-CM | POA: Diagnosis not present

## 2017-09-06 DIAGNOSIS — R079 Chest pain, unspecified: Secondary | ICD-10-CM | POA: Diagnosis not present

## 2017-09-06 DIAGNOSIS — S2220XA Unspecified fracture of sternum, initial encounter for closed fracture: Secondary | ICD-10-CM | POA: Diagnosis not present

## 2017-09-06 DIAGNOSIS — Z794 Long term (current) use of insulin: Secondary | ICD-10-CM | POA: Insufficient documentation

## 2017-09-06 LAB — COMPREHENSIVE METABOLIC PANEL
ALT: 24 U/L (ref 17–63)
AST: 26 U/L (ref 15–41)
Albumin: 3.7 g/dL (ref 3.5–5.0)
Alkaline Phosphatase: 65 U/L (ref 38–126)
Anion gap: 8 (ref 5–15)
BUN: 17 mg/dL (ref 6–20)
CHLORIDE: 98 mmol/L — AB (ref 101–111)
CO2: 27 mmol/L (ref 22–32)
Calcium: 9.3 mg/dL (ref 8.9–10.3)
Creatinine, Ser: 1.25 mg/dL — ABNORMAL HIGH (ref 0.61–1.24)
GFR, EST NON AFRICAN AMERICAN: 57 mL/min — AB (ref >60)
Glucose, Bld: 268 mg/dL — ABNORMAL HIGH (ref 65–99)
POTASSIUM: 5.1 mmol/L (ref 3.5–5.1)
Sodium: 133 mmol/L — ABNORMAL LOW (ref 135–145)
Total Bilirubin: 0.5 mg/dL (ref 0.3–1.2)
Total Protein: 6.2 g/dL — ABNORMAL LOW (ref 6.5–8.1)

## 2017-09-06 LAB — URINALYSIS, ROUTINE W REFLEX MICROSCOPIC
BACTERIA UA: NONE SEEN
Bilirubin Urine: NEGATIVE
GLUCOSE, UA: 150 mg/dL — AB
Ketones, ur: NEGATIVE mg/dL
Leukocytes, UA: NEGATIVE
NITRITE: NEGATIVE
PH: 5 (ref 5.0–8.0)
PROTEIN: NEGATIVE mg/dL
SPECIFIC GRAVITY, URINE: 1.033 — AB (ref 1.005–1.030)

## 2017-09-06 LAB — CDS SEROLOGY

## 2017-09-06 LAB — CBC
HEMATOCRIT: 37.1 % — AB (ref 39.0–52.0)
Hemoglobin: 12.3 g/dL — ABNORMAL LOW (ref 13.0–17.0)
MCH: 28.9 pg (ref 26.0–34.0)
MCHC: 33.2 g/dL (ref 30.0–36.0)
MCV: 87.1 fL (ref 78.0–100.0)
PLATELETS: 164 10*3/uL (ref 150–400)
RBC: 4.26 MIL/uL (ref 4.22–5.81)
RDW: 12.9 % (ref 11.5–15.5)
WBC: 8.1 10*3/uL (ref 4.0–10.5)

## 2017-09-06 LAB — ETHANOL: Alcohol, Ethyl (B): <10 mg/dL (ref <10)

## 2017-09-06 LAB — I-STAT CHEM 8, ED
BUN: 20 mg/dL (ref 6–20)
CREATININE: 1 mg/dL (ref 0.61–1.24)
Calcium, Ion: 1.21 mmol/L (ref 1.15–1.40)
Chloride: 95 mmol/L — ABNORMAL LOW (ref 101–111)
GLUCOSE: 250 mg/dL — AB (ref 65–99)
HEMATOCRIT: 39 % (ref 39.0–52.0)
HEMOGLOBIN: 13.3 g/dL (ref 13.0–17.0)
Potassium: 5 mmol/L (ref 3.5–5.1)
Sodium: 133 mmol/L — ABNORMAL LOW (ref 135–145)
TCO2: 25 mmol/L (ref 22–32)

## 2017-09-06 LAB — SAMPLE TO BLOOD BANK

## 2017-09-06 LAB — GLUCOSE, CAPILLARY: Glucose-Capillary: 190 mg/dL — ABNORMAL HIGH (ref 65–99)

## 2017-09-06 LAB — PROTIME-INR
INR: 1.01
Prothrombin Time: 13.2 seconds (ref 11.4–15.2)

## 2017-09-06 LAB — I-STAT CG4 LACTIC ACID, ED: Lactic Acid, Venous: 2.27 mmol/L (ref 0.5–1.9)

## 2017-09-06 LAB — HEMOGLOBIN A1C
Hgb A1c MFr Bld: 6.6 % — ABNORMAL HIGH (ref 4.8–5.6)
Mean Plasma Glucose: 142.72 mg/dL

## 2017-09-06 LAB — CBG MONITORING, ED: Glucose-Capillary: 216 mg/dL — ABNORMAL HIGH (ref 65–99)

## 2017-09-06 MED ORDER — INSULIN ASPART 100 UNIT/ML ~~LOC~~ SOLN
0.0000 [IU] | Freq: Three times a day (TID) | SUBCUTANEOUS | Status: DC
  Administered 2017-09-06: 5 [IU] via SUBCUTANEOUS
  Administered 2017-09-07: 2 [IU] via SUBCUTANEOUS
  Filled 2017-09-06: qty 1

## 2017-09-06 MED ORDER — ACETAMINOPHEN 325 MG PO TABS
650.0000 mg | ORAL_TABLET | ORAL | Status: DC | PRN
  Administered 2017-09-07 (×2): 650 mg via ORAL
  Filled 2017-09-06: qty 2

## 2017-09-06 MED ORDER — SODIUM CHLORIDE 0.9 % IV BOLUS
1000.0000 mL | Freq: Once | INTRAVENOUS | Status: AC
  Administered 2017-09-06: 1000 mL via INTRAVENOUS

## 2017-09-06 MED ORDER — RAMIPRIL 2.5 MG PO CAPS
2.5000 mg | ORAL_CAPSULE | Freq: Every day | ORAL | Status: DC
  Administered 2017-09-07: 2.5 mg via ORAL
  Filled 2017-09-06 (×2): qty 1

## 2017-09-06 MED ORDER — FENTANYL CITRATE (PF) 100 MCG/2ML IJ SOLN
50.0000 ug | Freq: Once | INTRAMUSCULAR | Status: AC
  Administered 2017-09-06: 50 ug via INTRAVENOUS
  Filled 2017-09-06: qty 2

## 2017-09-06 MED ORDER — FENTANYL CITRATE (PF) 100 MCG/2ML IJ SOLN: 12.5000 ug | INTRAMUSCULAR | Status: DC | PRN

## 2017-09-06 MED ORDER — OXYCODONE HCL 5 MG PO TABS
5.0000 mg | ORAL_TABLET | ORAL | Status: DC | PRN
  Administered 2017-09-06 – 2017-09-07 (×3): 10 mg via ORAL
  Filled 2017-09-06 (×3): qty 2

## 2017-09-06 MED ORDER — PRAVASTATIN SODIUM 40 MG PO TABS
40.0000 mg | ORAL_TABLET | Freq: Every day | ORAL | Status: DC
  Administered 2017-09-07: 40 mg via ORAL
  Filled 2017-09-06: qty 1

## 2017-09-06 MED ORDER — ONDANSETRON HCL 4 MG/2ML IJ SOLN: 4.0000 mg | Freq: Four times a day (QID) | INTRAMUSCULAR | Status: DC | PRN

## 2017-09-06 MED ORDER — ONDANSETRON 4 MG PO TBDP: 4.0000 mg | ORAL_TABLET | Freq: Four times a day (QID) | ORAL | Status: DC | PRN

## 2017-09-06 MED ORDER — SODIUM CHLORIDE 0.45 % IV SOLN
INTRAVENOUS | Status: DC
  Administered 2017-09-06 – 2017-09-07 (×2): via INTRAVENOUS

## 2017-09-06 MED ORDER — IOHEXOL 300 MG/ML  SOLN
100.0000 mL | Freq: Once | INTRAMUSCULAR | Status: AC
Start: 2017-09-06 — End: 2017-09-06
  Administered 2017-09-06: 100 mL via INTRAVENOUS

## 2017-09-06 MED ORDER — PREGABALIN 75 MG PO CAPS
150.0000 mg | ORAL_CAPSULE | Freq: Two times a day (BID) | ORAL | Status: DC
  Administered 2017-09-06 – 2017-09-07 (×2): 150 mg via ORAL
  Filled 2017-09-06 (×2): qty 2

## 2017-09-06 MED ORDER — METHOCARBAMOL 500 MG PO TABS
500.0000 mg | ORAL_TABLET | Freq: Three times a day (TID) | ORAL | Status: DC | PRN
  Administered 2017-09-07: 500 mg via ORAL
  Filled 2017-09-06: qty 1

## 2017-09-06 MED ORDER — ENOXAPARIN SODIUM 40 MG/0.4ML ~~LOC~~ SOLN
40.0000 mg | SUBCUTANEOUS | Status: DC
  Administered 2017-09-07: 40 mg via SUBCUTANEOUS
  Filled 2017-09-06: qty 0.4

## 2017-09-06 MED ORDER — PREGABALIN 25 MG PO CAPS: 150.0000 mg | ORAL_CAPSULE | Freq: Two times a day (BID) | ORAL | Status: DC

## 2017-09-06 MED ORDER — TETANUS-DIPHTH-ACELL PERTUSSIS 5-2.5-18.5 LF-MCG/0.5 IM SUSP: 0.5000 mL | Freq: Once | INTRAMUSCULAR | Status: DC

## 2017-09-06 MED ORDER — CARBAMAZEPINE 200 MG PO TABS
200.0000 mg | ORAL_TABLET | Freq: Two times a day (BID) | ORAL | Status: DC
  Administered 2017-09-06 – 2017-09-07 (×2): 200 mg via ORAL
  Filled 2017-09-06 (×4): qty 1

## 2017-09-06 NOTE — ED Notes (Signed)
C-collar removed by Dr. Grandville Silos, family at bedside.
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
## 2017-09-06 NOTE — ED Notes (Signed)
Pt returned from [place], moved to [address]
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
## 2017-09-06 NOTE — Progress Notes (Signed)
Patient ID: John Reese, male   DOB: 27-Oct-1950, 67 y.o.   MRN: [id number] Reviewed CT scan of cervical spine when asked by emergency department. Patient has a left transverse process of C7 this is not an unstable fracture I would recommend soft cervical collar until patient has decreased pain to do flexion-extension but he will not need an orthosis with normal flexion extension films.
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
## 2017-09-07 ENCOUNTER — Observation Stay (HOSPITAL_COMMUNITY): Payer: No Typology Code available for payment source

## 2017-09-07 DIAGNOSIS — S2222XA Fracture of body of sternum, initial encounter for closed fracture: Secondary | ICD-10-CM | POA: Diagnosis not present

## 2017-09-07 DIAGNOSIS — S2220XA Unspecified fracture of sternum, initial encounter for closed fracture: Secondary | ICD-10-CM | POA: Diagnosis not present

## 2017-09-07 DIAGNOSIS — S2242XA Multiple fractures of ribs, left side, initial encounter for closed fracture: Secondary | ICD-10-CM | POA: Diagnosis not present

## 2017-09-07 DIAGNOSIS — S2243XA Multiple fractures of ribs, bilateral, initial encounter for closed fracture: Secondary | ICD-10-CM | POA: Diagnosis not present

## 2017-09-07 DIAGNOSIS — S12601A Unspecified nondisplaced fracture of seventh cervical vertebra, initial encounter for closed fracture: Secondary | ICD-10-CM | POA: Diagnosis not present

## 2017-09-07 DIAGNOSIS — E119 Type 2 diabetes mellitus without complications: Secondary | ICD-10-CM | POA: Diagnosis not present

## 2017-09-07 LAB — CBC
HCT: 32 % — ABNORMAL LOW (ref 39.0–52.0)
HEMOGLOBIN: 10.7 g/dL — AB (ref 13.0–17.0)
MCH: 29 pg (ref 26.0–34.0)
MCHC: 33.4 g/dL (ref 30.0–36.0)
MCV: 86.7 fL (ref 78.0–100.0)
Platelets: 135 10*3/uL — ABNORMAL LOW (ref 150–400)
RBC: 3.69 MIL/uL — AB (ref 4.22–5.81)
RDW: 13 % (ref 11.5–15.5)
WBC: 5.9 10*3/uL (ref 4.0–10.5)

## 2017-09-07 LAB — COMPLETE METABOLIC PANEL WITH GFR
AG RATIO: 1.7 (calc) (ref 1.0–2.5)
ALKALINE PHOSPHATASE (APISO): 71 U/L (ref 40–115)
ALT: 16 U/L (ref 9–46)
AST: 14 U/L (ref 10–35)
Albumin: 4.2 g/dL (ref 3.6–5.1)
BILIRUBIN TOTAL: 0.4 mg/dL (ref 0.2–1.2)
BUN: 19 mg/dL (ref 7–25)
CO2: 28 mmol/L (ref 20–32)
Calcium: 9.7 mg/dL (ref 8.6–10.3)
Chloride: 97 mmol/L — ABNORMAL LOW (ref 98–110)
Creat: 0.98 mg/dL (ref 0.70–1.25)
GFR, EST AFRICAN AMERICAN: 91 mL/min/{1.73_m2} (ref >=60)
GFR, Est Non African American: 79 mL/min/{1.73_m2} (ref >=60)
GLUCOSE: 122 mg/dL — AB (ref 65–99)
Globulin: 2.5 g/dL (calc) (ref 1.9–3.7)
POTASSIUM: 4.6 mmol/L (ref 3.5–5.3)
Sodium: 134 mmol/L — ABNORMAL LOW (ref 135–146)
Total Protein: 6.7 g/dL (ref 6.1–8.1)

## 2017-09-07 LAB — BASIC METABOLIC PANEL
ANION GAP: 8 (ref 5–15)
BUN: 12 mg/dL (ref 6–20)
CO2: 27 mmol/L (ref 22–32)
Calcium: 8.4 mg/dL — ABNORMAL LOW (ref 8.9–10.3)
Chloride: 99 mmol/L — ABNORMAL LOW (ref 101–111)
Creatinine, Ser: 0.94 mg/dL (ref 0.61–1.24)
GFR calc Af Amer: >60 mL/min (ref >60)
GFR calc non Af Amer: >60 mL/min (ref >60)
GLUCOSE: 133 mg/dL — AB (ref 65–99)
POTASSIUM: 4.4 mmol/L (ref 3.5–5.1)
Sodium: 134 mmol/L — ABNORMAL LOW (ref 135–145)

## 2017-09-07 LAB — HEMOGLOBIN A1C
Hgb A1c MFr Bld: 6.7 % of total Hgb — ABNORMAL HIGH (ref <5.7)
MEAN PLASMA GLUCOSE: 146 (calc)
eAG (mmol/L): 8.1 (calc)

## 2017-09-07 LAB — GLUCOSE, CAPILLARY: Glucose-Capillary: 140 mg/dL — ABNORMAL HIGH (ref 65–99)

## 2017-09-07 MED ORDER — OXYCODONE HCL 5 MG PO TABS: 5.0000 mg | ORAL_TABLET | Freq: Four times a day (QID) | ORAL | 0 refills | Status: DC | PRN

## 2017-09-07 NOTE — Discharge Summary (Signed)
Physician Discharge Summary  Patient ID: John Reese MRN: [id number] DOB/AGE: Feb 11, 1951 67 y.o.  Admit date: 09/06/2017 Discharge date: 09/07/2017  Admission Diagnoses:Active Problems:   Multiple fractures of ribs, bilateral, initial encounter for closed fracture  Discharge Diagnoses:  Active Problems:   Multiple fractures of ribs, bilateral, initial encounter for closed fracture   Discharged Condition: good  Hospital Course: Pt had good pain control and good effort on IS.  His CXR was stable and no EKG changes. His WOB was normal and he ambulated without difficulty.   Consults: None  Significant Diagnostic Studies: labs:  CBC    Component Value Date/Time   WBC 5.9 09/07/2017 0619   RBC 3.69 (L) 09/07/2017 0619   HGB 10.7 (L) 09/07/2017 0619   HGB 11.9 (L) 10/26/2015 1026   HCT 32.0 (L) 09/07/2017 0619   HCT 37.2 (L) 10/26/2015 1026   PLT 135 (L) 09/07/2017 0619   PLT 202 10/26/2015 1026   MCV 86.7 09/07/2017 0619   MCV 91 10/26/2015 1026   MCH 29.0 09/07/2017 0619   MCHC 33.4 09/07/2017 0619   RDW 13.0 09/07/2017 0619   RDW 13.8 10/26/2015 1026   LYMPHSABS 1.6 10/26/2015 1026   MONOABS 0.3 02/23/2014 1155   EOSABS 0.2 10/26/2015 1026   BASOSABS 0.0 10/26/2015 1026   and radiology: CXR: no PTX no hemothorax or effusion  Treatments: IV hydration  Discharge Exam: Blood pressure (!) 142/82, pulse 91, temperature 98.5 F (36.9 C), temperature source Oral, resp. rate 17, height 6\' 1"  (1.854 m), weight 66.2 kg (146 lb), SpO2 100 %. General appearance: alert and cooperative Resp: clear to auscultation bilaterally Cardio: regular rate and rhythm, S1, S2 normal, no murmur, click, rub or gallop Neurologic: Grossly normal  Disposition: Discharge disposition: 01-Home or Self Care       Discharge Instructions    Diet - low sodium heart healthy   Complete by:  As directed    Increase activity slowly   Complete by:  As directed      Allergies as of 09/07/2017       Reactions   No Known Allergies       Medication List    TAKE these medications   aspirin EC 81 MG tablet Take 81 mg by mouth daily.   carbamazepine 200 MG tablet Commonly known as:  TEGRETOL Take 200 mg by mouth 2 (two) times daily.   carbamazepine 200 MG tablet Commonly known as:  TEGRETOL Take 2 tablets (400 mg total) by mouth 2 (two) times daily.   CINNAMON PO Take 1,000 mg by mouth daily.   fish oil-omega-3 fatty acids 1000 MG capsule Take 2 g by mouth daily.   ibuprofen 200 MG tablet Commonly known as:  ADVIL,MOTRIN Take 800 mg by mouth as needed for moderate pain.   metFORMIN 500 MG tablet Commonly known as:  GLUCOPHAGE TAKE 1 TABLET (500 MG TOTAL) BY MOUTH 2 (TWO) TIMES DAILY WITH A MEAL.   multivitamin tablet Take 1 tablet by mouth daily.   oxyCODONE 5 MG immediate release tablet Commonly known as:  Oxy IR/ROXICODONE Take 1 tablet (5 mg total) by mouth every 6 (six) hours as needed for severe pain.   pravastatin 40 MG tablet Commonly known as:  PRAVACHOL Take 40 mg by mouth daily.   pregabalin 75 MG capsule Commonly known as:  LYRICA Take 150 mg by mouth 2 (two) times daily.   pregabalin 50 MG capsule Commonly known as:  LYRICA Take 3 capsules (150 mg  total) by mouth 2 (two) times daily.   ramipril 2.5 MG capsule Commonly known as:  ALTACE Take 2.5 mg by mouth daily.   TOUJEO SOLOSTAR [place] Inject 8 Units into the skin at bedtime. What changed:  Another medication with the same name was changed. Make sure you understand how and when to take each.   TOUJEO SOLOSTAR 300 UNIT/ML Sopn Generic drug:  Insulin Glargine INJECT 15 UNITS AS DIRECTED AT BEDTIME. What changed:  how much to take   ULTICARE MINI PEN NEEDLES 31G X 6 MM Misc Generic drug:  Insulin Pen Needle USE AS DIRECTED AT BEDTIME        Signed: Joyice Faster John Reese 09/07/2017, 10:23 AM

## 2017-09-07 NOTE — Discharge Instructions (Signed)
Blunt Chest Trauma °Blunt chest trauma is an injury that is caused by a hard, direct hit (blow) to the chest. The blow can be strong enough to injure multiple body parts. Blunt chest trauma often results in bruised or broken (fractured) ribs. In many cases, the soft tissue in the chest wall is also injured, and this causes pain and bruising. Internal organs, such as the heart and lungs, can become injured as well. °Blunt chest trauma can lead to serious medical problems. This injury requires immediate medical care. °What are the causes? °Causes of blunt chest trauma include: °· Motor vehicle collisions. °· Falls. °· Physical violence. °· Sports injuries. °What are the signs or symptoms? °Symptoms of this condition include: °· Chest pain. The pain may be worse when you move or breathe deeply. °· Shortness of breath. °· Light-headedness. °· Bruising. °· Tenderness. °· Swelling. °How is this diagnosed? °This condition is diagnosed with a medical history and physical exam. You may also have imaging tests, including: °· X-rays. °· Ultrasounds. °· CT scans. °· MRI. °How is this treated? °Treatment for this condition varies depending on the type of injury you have and its severity. You may need to stay in the hospital until you recover. Treatment may include: °· Pain medicine. You may be given pain medicine through an IV tube at first. You may also be given over-the-counter and °· prescription pain relievers. °· Tubes or other devices to help you breathe. °· Surgery to repair broken ribs and fix the surrounding damaged tissue and organs. °Follow these instructions at home: °· If directed, apply ice to the injured area: °¨ Put ice in a plastic bag. °¨ Place a towel between your skin and the bag. °¨ Leave the ice on for 20 minutes, 2-3 times per day. °· Take over-the-counter and prescription medicines only as told by your health care provider. °· Keep all follow-up visits as told by your health care provider. This is  important. °Contact a health care provider if: °· Your pain gets worse after treatment. °· You have nausea or you vomit. °· You have pain in your abdomen. °· You have a fever. °· You feel dizzy or weak. °Get help right away if: °· You have shortness of breath. °· You cough up blood. °· You faint. °· You have severe chest pain. This may come with other symptoms, such as: °¨ Dizziness. °¨ Shortness of breath. °¨ Pain in your neck, jaw, or back, or in one arm or both arms. °This information is not intended to replace advice given to you by your health care provider. Make sure you discuss any questions you have with your health care provider. °Document Released: 05/17/2004 Document Revised: 09/21/2015 Document Reviewed: 10/06/2014 °Elsevier Interactive Patient Education © 2017 [employer] ° °
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
## 2017-09-07 NOTE — Plan of Care (Signed)
  Problem: Clinical Measurements: Goal: Respiratory complications will improve Outcome: Progressing   Problem: Activity: Goal: Risk for activity intolerance will decrease Outcome: Progressing   

## 2017-09-13 ENCOUNTER — Ambulatory Visit: Payer: PPO | Admitting: "Endocrinology

## 2017-09-18 ENCOUNTER — Ambulatory Visit: Payer: PPO | Admitting: Neurology

## 2017-09-20 ENCOUNTER — Telehealth: Payer: Self-pay | Admitting: Neurology
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
## 2017-09-20 NOTE — Telephone Encounter (Signed)
yes

## 2017-09-20 NOTE — Telephone Encounter (Signed)
Dr. Carles Collet - see last BMP result.  You had stated you may want to adjust Lyrica dosage after results.  Please advise.

## 2017-09-20 NOTE — Telephone Encounter (Signed)
Since he is on Lyrica 150 mg twice daily what would you like him to do with the medicine?

## 2017-09-20 NOTE — Telephone Encounter (Signed)
Patient wife wants to come by and pick up some lyrica samples
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
## 2017-09-23 MED ORDER — PREGABALIN 50 MG PO CAPS: ORAL_CAPSULE | ORAL | 0 refills | Status: DC

## 2017-09-23 NOTE — Telephone Encounter (Signed)
Drop to 150mg  in AM/75 mg at night.  If does okay for 2 weeks, then 75 mg bid

## 2017-09-23 NOTE — Telephone Encounter (Signed)
I spoke with patient. He states he ran out of 75 mg over the weekend, but had 50 mg tablets we gave him. Instead of taking 150 mg of them BID he has been taking 100 mg BID over the weekend and pain is well controlled. He will stay on this for 2 weeks and if doing well will try to reduce to 50 mg in the evening and stay at 100 mg in the morning.  Dr. Carles Collet - please let me know if this is not okay.  He will pick up samples of 50 mg to make it til appointment (75 mg samples unavailable).

## 2017-09-24 DIAGNOSIS — S2239XB Fracture of one rib, unspecified side, initial encounter for open fracture: Secondary | ICD-10-CM | POA: Diagnosis not present

## 2017-09-26 ENCOUNTER — Other Ambulatory Visit: Payer: Self-pay | Admitting: "Endocrinology

## 2017-09-29 ENCOUNTER — Other Ambulatory Visit: Payer: Self-pay | Admitting: "Endocrinology

## 2017-10-14 ENCOUNTER — Encounter: Payer: Self-pay | Admitting: "Endocrinology

## 2017-10-14 ENCOUNTER — Ambulatory Visit: Payer: PPO | Admitting: "Endocrinology

## 2017-10-14 VITALS — BP 133/82 | HR 81 | Ht 73.0 in | Wt 141.0 lb

## 2017-10-14 DIAGNOSIS — E782 Mixed hyperlipidemia: Secondary | ICD-10-CM

## 2017-10-14 DIAGNOSIS — E1165 Type 2 diabetes mellitus with hyperglycemia: Secondary | ICD-10-CM

## 2017-10-14 DIAGNOSIS — I10 Essential (primary) hypertension: Secondary | ICD-10-CM

## 2017-10-14 DIAGNOSIS — Z794 Long term (current) use of insulin: Secondary | ICD-10-CM | POA: Diagnosis not present

## 2017-10-14 DIAGNOSIS — IMO0001 Reserved for inherently not codable concepts without codable children: Secondary | ICD-10-CM

## 2017-10-14 NOTE — Progress Notes (Signed)
Subjective:    Patient ID: John Reese, male    DOB: [birth year], PCP [name], MD   Past Medical History:  Diagnosis Date  . Auditory disturbance    Decreased auditory acuity  . 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 Diabetes ([place])   . Dyslipidemia   . Tremor   . Trigeminal neuralgia    Right, V2 distribution   Past Surgical History:  Procedure Laterality Date  . FINGER AMPUTATION    . INNER EAR SURGERY Right    Ear drum repair  . PULSE GENERATOR IMPLANT Bilateral 03/30/2016   Procedure: BILATERAL PLACEMENT OF IMPLANTABLE PULSE GENERATOR;  Surgeon: Erline Levine, MD;  Location: [facility];  Service: Neurosurgery;  Laterality: Bilateral;  BILATERAL PLACEMENT OF IMPLANTABLE PULSE GENERATOR  . SUBTHALAMIC STIMULATOR INSERTION Bilateral 03/22/2016   Procedure: BILATERAL DEEP BRAIN STIMULATOR PLACEMENT WITH STARFIX WITH DR. Carles Collet;  Surgeon: Erline Levine, MD;  Location: [facility];  Service: Neurosurgery;  Laterality: Bilateral;   Social History   Socioeconomic History  . Marital status: Married    Spouse name: Not on file  . Number of children: 2  . Years of education: HS  . Highest education level: Not on file  Occupational History  . Occupation: Works in Optometrist: [employer]  . Financial resource strain: Not on file  . Food insecurity:    Worry: Not on file    Inability: Not on file  . Transportation needs:    Medical: Not on file    Non-medical: Not on file  Tobacco Use  . Smoking status: Never Smoker  . Smokeless tobacco: Never Used  Substance and Sexual Activity  . Alcohol use: No  . Drug use: No  . Sexual activity: Not on file  Lifestyle  . Physical activity:    Days per week: Not on file    Minutes per session: Not on file  . Stress: Not on file  Relationships  . Social connections:    Talks on phone: Not on file    Gets together: Not on file    Attends religious service: Not on file     Active member of club or organization: Not on file    Attends meetings of clubs or organizations: Not on file    Relationship status: Not on file  Other Topics Concern  . Not on file  Social History Narrative   Patient is right handed.   Patient drinks 2 cups coffee daily.   Outpatient Encounter Medications as of 10/14/2017  Medication Sig  . aspirin EC 81 MG tablet Take 81 mg by mouth daily.  . carbamazepine (TEGRETOL) 200 MG tablet Take 200 mg by mouth 2 (two) times daily.  Marland Kitchen CINNAMON PO Take 1,000 mg by mouth daily.  . fish oil-omega-3 fatty acids 1000 MG capsule Take 2 g by mouth daily.  Marland Kitchen ibuprofen (ADVIL,MOTRIN) 200 MG tablet Take 800 mg by mouth as needed for moderate pain.  . Insulin Glargine (TOUJEO SOLOSTAR Br[place]) Inject 8 Units into the skin at bedtime.  . metFORMIN (GLUCOPHAGE) 500 MG tablet TAKE 1 TABLET (500 MG TOTAL) BY MOUTH 2 (TWO) TIMES DAILY WITH A MEAL.  . Multiple Vitamin (MULTIVITAMIN) tablet Take 1 tablet by mouth daily.  . pravastatin (PRAVACHOL) 40 MG tablet Take 40 mg by mouth daily.  . pregabalin (LYRICA) 50 MG capsule 100 mg in am, 50 mg at night  . ramipril (ALTACE)  2.5 MG capsule Take 2.5 mg by mouth daily.  [name] 300 UNIT/ML SOPN INJECT 15 UNITS AS DIRECTED AT BEDTIME. (Patient taking differently: Inject 8 Units as directed at bedtime. )  . ULTICARE MINI PEN NEEDLES 31G X 6 MM MISC USE AS DIRECTED AT BEDTIME  . [DISCONTINUED] carbamazepine (TEGRETOL) 200 MG tablet Take 2 tablets (400 mg total) by mouth 2 (two) times daily. (Patient not taking: Reported on 09/06/2017)  . [DISCONTINUED] oxyCODONE (OXY IR/ROXICODONE) 5 MG immediate release tablet Take 1 tablet (5 mg total) by mouth every 6 (six) hours as needed for severe pain.  . [DISCONTINUED] pregabalin (LYRICA) 75 MG capsule Take 150 mg by mouth 2 (two) times daily.  . [DISCONTINUED] TOUJEO SOLOSTAR 300 UNIT/ML SOPN INJECT 15 UNITS AS DIRECTED AT BEDTIME.   No facility-administered encounter  medications on file as of 10/14/2017.    ALLERGIES: Allergies  Allergen Reactions  . No Known Allergies    VACCINATION STATUS: There is no immunization history for the selected administration types on file for this patient.  Diabetes  He presents for his follow-up diabetic visit. He has type 2 diabetes mellitus. Onset time: He was diagnosed at approximate age of 27 years. His disease course has been stable (He is diagnosed with essential tremors since last visit, following with neurology.). There are no hypoglycemic associated symptoms. Pertinent negatives for hypoglycemia include no confusion, headaches, pallor or seizures. There are no diabetic associated symptoms. Pertinent negatives for diabetes include no chest pain, no fatigue, no polydipsia, no polyphagia, no polyuria and no weakness. There are no hypoglycemic complications. Symptoms are stable. There are no diabetic complications. Risk factors for coronary artery disease include diabetes mellitus, dyslipidemia, hypertension, male sex and sedentary lifestyle. Current diabetic treatment includes insulin injections and oral agent (monotherapy). He is compliant with treatment most of the time. His weight is decreasing steadily. He is following a generally healthy diet. When asked about meal planning, he reported none. He has had a previous visit with a dietitian. He participates in exercise intermittently. His home blood glucose trend is decreasing steadily. His breakfast blood glucose range is generally 70-90 mg/dl. His overall blood glucose range is 70-90 mg/dl. An ACE inhibitor/angiotensin II receptor blocker is being taken.  Hyperlipidemia  This is a chronic problem. The current episode started more than 1 year ago. The problem is controlled. Exacerbating diseases include diabetes. Pertinent negatives include no chest pain, myalgias or shortness of breath. Current antihyperlipidemic treatment includes statins. Risk factors for coronary artery  disease include diabetes mellitus, dyslipidemia, hypertension, male sex and a sedentary lifestyle.  Hypertension  This is a chronic problem. The current episode started more than 1 year ago. The problem is uncontrolled. Pertinent negatives include no chest pain, headaches, neck pain, palpitations or shortness of breath. Risk factors for coronary artery disease include dyslipidemia and diabetes mellitus. Past treatments include ACE inhibitors.    Review of Systems  Constitutional: Negative for fatigue and unexpected weight change.  HENT: Negative for dental problem, mouth sores and trouble swallowing.   Eyes: Negative for visual disturbance.  Respiratory: Negative for cough, choking, chest tightness, shortness of breath and wheezing.   Cardiovascular: Negative for chest pain, palpitations and leg swelling.  Gastrointestinal: Negative for abdominal distention, abdominal pain, constipation, diarrhea, nausea and vomiting.  Endocrine: Negative for polydipsia, polyphagia and polyuria.  Genitourinary: Negative for dysuria, flank pain, hematuria and urgency.  Musculoskeletal: Negative for back pain, gait problem, myalgias and neck pain.  Skin: Negative for pallor, rash  and wound.  Neurological: Negative for seizures, syncope, weakness, numbness and headaches.  Psychiatric/Behavioral: Negative.  Negative for confusion and dysphoric mood.    Objective:    BP 133/82   Pulse 81   Ht 6\' 1"  (1.854 m)   Wt 141 lb (64 kg)   BMI 18.60 kg/m   Wt Readings from Last 3 Encounters:  10/14/17 141 lb (64 kg)  09/06/17 146 lb (66.2 kg)  08/19/17 146 lb (66.2 kg)    Physical Exam  Constitutional: He is oriented to person, place, and time. He appears well-developed. He is cooperative. No distress.  HENT:  Head: Normocephalic and atraumatic.  Eyes: EOM are normal.  Neck: Normal range of motion. Neck supple. No tracheal deviation present. No thyromegaly present.  Cardiovascular: Normal rate, S1 normal and  S2 normal. Exam reveals no gallop.  No murmur heard. Pulses:      Dorsalis pedis pulses are 2+ on the right side, and 2+ on the left side.       Posterior tibial pulses are 2+ on the right side, and 2+ on the left side.  Pulmonary/Chest: Effort normal. No respiratory distress. He has no wheezes.  Abdominal: Soft. Bowel sounds are normal. He exhibits no distension. There is no tenderness. There is no guarding and no CVA tenderness.  Musculoskeletal: He exhibits no edema.       Right shoulder: He exhibits no swelling and no deformity.  Neurological: He is alert and oriented to person, place, and time. He has normal strength. No cranial nerve deficit or sensory deficit. Coordination and gait normal.     Skin: Skin is warm and dry. No rash noted. No cyanosis. Nails show no clubbing.  Psychiatric: He has a normal mood and affect. His speech is normal and behavior is normal. Judgment and thought content normal. Cognition and memory are normal.    Results for orders placed or performed during the hospital encounter of 09/06/17  CDS serology  Result Value Ref Range   CDS serology specimen      SPECIMEN WILL BE HELD FOR 14 DAYS IF TESTING IS REQUIRED  Comprehensive metabolic panel  Result Value Ref Range   Sodium 133 (L) 135 - 145 mmol/L   Potassium 5.1 3.5 - 5.1 mmol/L   Chloride 98 (L) 101 - 111 mmol/L   CO2 27 22 - 32 mmol/L   Glucose, Bld 268 (H) 65 - 99 mg/dL   BUN 17 6 - 20 mg/dL   Creatinine, Ser 1.25 (H) 0.61 - 1.24 mg/dL   Calcium 9.3 8.9 - 10.3 mg/dL   Total Protein 6.2 (L) 6.5 - 8.1 g/dL   Albumin 3.7 3.5 - 5.0 g/dL   AST 26 15 - 41 U/L   ALT 24 17 - 63 U/L   Alkaline Phosphatase 65 38 - 126 U/L   Total Bilirubin 0.5 0.3 - 1.2 mg/dL   GFR calc non Af Amer 57 (L) >60 mL/min   GFR calc Af Amer >60 >60 mL/min   Anion gap 8 5 - 15  CBC  Result Value Ref Range   WBC 8.1 4.0 - 10.5 K/uL   RBC 4.26 4.22 - 5.81 MIL/uL   Hemoglobin 12.3 (L) 13.0 - 17.0 g/dL   HCT 37.1 (L) 39.0 -  52.0 %   MCV 87.1 78.0 - 100.0 fL   MCH 28.9 26.0 - 34.0 pg   MCHC 33.2 30.0 - 36.0 g/dL   RDW 12.9 11.5 - 15.5 %   Platelets 164 150 -  400 K/uL  Ethanol  Result Value Ref Range   Alcohol, Ethyl (B) <10 <10 mg/dL  Urinalysis, Routine w reflex microscopic  Result Value Ref Range   Color, Urine YELLOW YELLOW   APPearance CLEAR CLEAR   Specific Gravity, Urine 1.033 (H) 1.005 - 1.030   pH 5.0 5.0 - 8.0   Glucose, UA 150 (A) NEGATIVE mg/dL   Hgb urine dipstick SMALL (A) NEGATIVE   Bilirubin Urine NEGATIVE NEGATIVE   Ketones, ur NEGATIVE NEGATIVE mg/dL   Protein, ur NEGATIVE NEGATIVE mg/dL   Nitrite NEGATIVE NEGATIVE   Leukocytes, UA NEGATIVE NEGATIVE   RBC / HPF 0-5 0 - 5 RBC/hpf   WBC, UA 0-5 0 - 5 WBC/hpf   Bacteria, UA NONE SEEN NONE SEEN   Mucus PRESENT    Hyaline Casts, UA PRESENT   Protime-INR  Result Value Ref Range   Prothrombin Time 13.2 11.4 - 15.2 seconds   INR 1.01   Hemoglobin A1c  Result Value Ref Range   Hgb A1c MFr Bld 6.6 (H) 4.8 - 5.6 %   Mean Plasma Glucose 142.72 mg/dL  CBC  Result Value Ref Range   WBC 5.9 4.0 - 10.5 K/uL   RBC 3.69 (L) 4.22 - 5.81 MIL/uL   Hemoglobin 10.7 (L) 13.0 - 17.0 g/dL   HCT 32.0 (L) 39.0 - 52.0 %   MCV 86.7 78.0 - 100.0 fL   MCH 29.0 26.0 - 34.0 pg   MCHC 33.4 30.0 - 36.0 g/dL   RDW 13.0 11.5 - 15.5 %   Platelets 135 (L) 150 - 400 K/uL  Basic metabolic panel  Result Value Ref Range   Sodium 134 (L) 135 - 145 mmol/L   Potassium 4.4 3.5 - 5.1 mmol/L   Chloride 99 (L) 101 - 111 mmol/L   CO2 27 22 - 32 mmol/L   Glucose, Bld 133 (H) 65 - 99 mg/dL   BUN 12 6 - 20 mg/dL   Creatinine, Ser 0.94 0.61 - 1.24 mg/dL   Calcium 8.4 (L) 8.9 - 10.3 mg/dL   GFR calc non Af Amer >60 >60 mL/min   GFR calc Af Amer >60 >60 mL/min   Anion gap 8 5 - 15  Glucose, capillary  Result Value Ref Range   Glucose-Capillary 190 (H) 65 - 99 mg/dL   Comment 1 Notify RN    Comment 2 Document in Chart   Glucose, capillary  Result Value Ref Range    Glucose-Capillary 140 (H) 65 - 99 mg/dL  I-Stat Chem 8, ED  Result Value Ref Range   Sodium 133 (L) 135 - 145 mmol/L   Potassium 5.0 3.5 - 5.1 mmol/L   Chloride 95 (L) 101 - 111 mmol/L   BUN 20 6 - 20 mg/dL   Creatinine, Ser 1.00 0.61 - 1.24 mg/dL   Glucose, Bld 250 (H) 65 - 99 mg/dL   Calcium, Ion 1.21 1.15 - 1.40 mmol/L   TCO2 25 22 - 32 mmol/L   Hemoglobin 13.3 13.0 - 17.0 g/dL   HCT 39.0 39.0 - 52.0 %  I-Stat CG4 Lactic Acid, ED  Result Value Ref Range   Lactic Acid, Venous 2.27 (HH) 0.5 - 1.9 mmol/L   Comment NOTIFIED PHYSICIAN   CBG monitoring, ED  Result Value Ref Range   Glucose-Capillary 216 (H) 65 - 99 mg/dL  Sample to Blood Bank  Result Value Ref Range   Blood Bank Specimen SAMPLE AVAILABLE FOR TESTING    Sample Expiration  09/07/2017 Performed at [facility] [address], [place], [place] [address]    Diabetic Labs (most recent): Lab Results  Component Value Date   HGBA1C 6.6 (H) 09/06/2017   HGBA1C 6.7 (H) 09/06/2017   HGBA1C 6.3 (H) 07/10/2017   Lipid Panel     Component Value Date/Time   CHOL 175 06/27/2015 0816   TRIG 99 06/27/2015 0816   HDL 42 06/27/2015 0816   CHOLHDL 4.2 06/27/2015 0816   VLDL 20 06/27/2015 0816   LDLCALC 113 06/27/2015 0816    Assessment & Plan:   1. Uncontrolled type 2 diabetes mellitus without complication, with long-term current use of insulin ([facility]) -No gross complications from his diabetes so far, however patient remains at a high risk for more acute and chronic complications of diabetes which include CAD, CVA, CKD, retinopathy, and neuropathy. These are all discussed in detail with the patient.  Patient came with stable A1c of 6.6%.  -His logs still show his fasting glycemic profile is on target.  - I have re-counseled the patient on diet management  by adopting a carbohydrate restricted / protein rich  Diet.  -  Suggestion is made for him to avoid simple carbohydrates  from his diet including Cakes,  Sweet Desserts / Pastries, Ice Cream, Soda (diet and regular), Sweet Tea, Candies, Chips, Cookies, Store Bought Juices, Alcohol in Excess of  1-2 drinks a day, Artificial Sweeteners, and "Sugar-free" Products. This will help patient to have stable blood glucose profile and potentially avoid unintended weight gain.   - Patient is advised to stick to a routine mealtimes to eat 3 meals  a day and avoid unnecessary snacks ( to snack only to correct hypoglycemia).  - I have approached patient with the following individualized plan to manage diabetes and patient agrees.   -Due to his unintentional weight loss, will be advised to stop metformin.   -I advised him to continue Toujeo 8 units nightly , associated with monitoring of blood glucose  daily before breakfast and as needed.  - His c-peptide is critically low at 0.5, indicating pancreatic exhaustion.  - It is only a matter of time before he will require prandial insulin in addition to his basal insulin. - His GAD antibodies are negative, however does not rule out a possibility of LADA.  He is not a candidate for incretin therapy.  - Patient specific target  for A1c; LDL, HDL, Triglycerides, and  Waist Circumference were discussed in detail.  2) BP/HTN: His blood pressure is not controlled to target this morning, he is advised to continue current medications including ACEI/ARB. 3) Lipids/HPL: Uncontrolled, LDL at 113, he is advised to continue pravastatin. 4)  Weight/Diet: If he continues to lose weight unintentionally, he will be considered for Creon therapy.   exercise, and carbohydrates information provided.  5) Chronic Care/Health Maintenance:  -Patient is on ACEI/ARB and Statin medications and encouraged to continue to follow up with Ophthalmology, Podiatrist at least yearly or according to recommendations, and advised to  stay away from smoking. I have recommended yearly flu vaccine and pneumonia vaccination at least every 5 years;  moderate intensity exercise for up to 150 minutes weekly; and  sleep for at least 7 hours a day. - He was recently diagnosed with essential tremors. I advised him to continue follow-up with neurology.   - I advised patient to maintain close follow up with [name], MD for primary care needs.  - Time spent with the patient: 25 min, of  which >50% was spent in reviewing his blood glucose logs , discussing his hypo- and hyper-glycemic episodes, reviewing his current and  previous labs and insulin doses and developing a plan to avoid hypo- and hyper-glycemia. Please refer to Patient Instructions for Blood Glucose Monitoring and Insulin/Medications Dosing Guide"  in media tab for additional information. [employer] participated in the discussions, expressed understanding, and voiced agreement with the above plans.  All questions were answered to his satisfaction. he is encouraged to contact clinic should he have any questions or concerns prior to his return visit.  Follow up plan: -Return in about 4 months (around 02/13/2018) for follow up with pre-visit labs, meter, and logs.  Glade Maruice, MD Phone: [phone]  Fax: [phone]  -  This note was partially dictated with voice recognition software. Similar sounding words can be transcribed inadequately or may not  be corrected upon review.  10/14/2017, 12:20 PM
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
## 2017-10-28 ENCOUNTER — Ambulatory Visit: Payer: PPO | Admitting: Neurology

## 2017-10-28 ENCOUNTER — Other Ambulatory Visit: Payer: Self-pay

## 2017-10-28 ENCOUNTER — Encounter: Payer: Self-pay | Admitting: Neurology

## 2017-10-28 VITALS — BP 118/60 | HR 86 | Wt 141.0 lb

## 2017-10-28 DIAGNOSIS — G500 Trigeminal neuralgia: Secondary | ICD-10-CM | POA: Diagnosis not present

## 2017-10-28 DIAGNOSIS — Z9689 Presence of other specified functional implants: Secondary | ICD-10-CM | POA: Diagnosis not present

## 2017-10-28 DIAGNOSIS — G250 Essential tremor: Secondary | ICD-10-CM | POA: Diagnosis not present

## 2017-10-28 MED ORDER — PREGABALIN 75 MG PO CAPS: ORAL_CAPSULE | ORAL | 0 refills | Status: DC

## 2017-10-28 NOTE — Patient Instructions (Signed)
Take Lyrica 75mg  once in the morning and once in the afternoon.

## 2017-10-28 NOTE — Procedures (Signed)
DBS Programming was performed.     Device was confirmed to be on.  Soft start was confirmed to be on.  Impedences were checked and were within normal limits.  Battery was checked and was determined to be functioning normally and not near the end of life (2.97 on both sides - same as last visit).  Final settings were as follows:  Left brain electrode:     1-2+           ; Amplitude  2.7   V   ; Pulse width 90 microseconds (tried to lower to 60 but had more tremor);   Frequency   150   Hz.   Right brain electrode:     1-2+          ; Amplitude   2.4  V ;  Pulse width 90  microseconds;  Frequency   150    Hz.

## 2017-11-01 DIAGNOSIS — G500 Trigeminal neuralgia: Secondary | ICD-10-CM | POA: Diagnosis not present

## 2017-11-01 DIAGNOSIS — E1169 Type 2 diabetes mellitus with other specified complication: Secondary | ICD-10-CM | POA: Diagnosis not present

## 2017-11-01 DIAGNOSIS — E785 Hyperlipidemia, unspecified: Secondary | ICD-10-CM | POA: Diagnosis not present

## 2017-11-01 DIAGNOSIS — E1159 Type 2 diabetes mellitus with other circulatory complications: Secondary | ICD-10-CM | POA: Diagnosis not present

## 2017-11-01 DIAGNOSIS — I10 Essential (primary) hypertension: Secondary | ICD-10-CM | POA: Diagnosis not present

## 2017-11-11 ENCOUNTER — Other Ambulatory Visit: Payer: Self-pay | Admitting: Neurology

## 2017-12-12 LAB — HM DIABETES EYE EXAM

## 2017-12-17 ENCOUNTER — Other Ambulatory Visit: Payer: Self-pay | Admitting: Neurology

## 2017-12-17 MED ORDER — PREGABALIN 75 MG PO CAPS: ORAL_CAPSULE | ORAL | 0 refills | Status: DC

## 2017-12-17 MED ORDER — PREGABALIN 75 MG PO CAPS: 75.0000 mg | ORAL_CAPSULE | Freq: Two times a day (BID) | ORAL | 5 refills | Status: DC

## 2017-12-17 NOTE — Telephone Encounter (Signed)
Patient wife called and wants to know if we can leave lyrica upfront for her  CB# [phone]

## 2017-12-17 NOTE — Telephone Encounter (Signed)
Spoke with patient's wife and let them know that I only have one box of Lyrica. They will pick up, but also want RX sent to pharmacy.   Dr. Carles Collet - please sign.

## 2018-02-03 ENCOUNTER — Other Ambulatory Visit: Payer: Self-pay | Admitting: Neurology

## 2018-02-03 MED ORDER — PREGABALIN 75 MG PO CAPS: 75.0000 mg | ORAL_CAPSULE | Freq: Two times a day (BID) | ORAL | 1 refills | Status: DC

## 2018-02-03 NOTE — Telephone Encounter (Signed)
Spoke with patient's wife. Let her know we could send generic lyrica in to the pharmacy. He is having more facial pain. Currently taking Tegretol 200 mg BID and Lyrica 75 mg BID. Last note mentioned possibly changing to CR version. Dr. Carles Collet please advise on dosage of Lyrica. They want 90 days supply sent to [facility]. Order pended for now.

## 2018-02-03 NOTE — Telephone Encounter (Signed)
Do you want to keep him only on Lyrica 75 mg BID?

## 2018-02-03 NOTE — Telephone Encounter (Signed)
Patient's wife made aware and made aware RX sent to pharmacy.

## 2018-02-03 NOTE — Telephone Encounter (Signed)
We do have samples of CR dose. It did go generic about a month ago.

## 2018-02-03 NOTE — Telephone Encounter (Signed)
Patient's wife called regarding his Lyrica medication and wanting to see if he could get the Generic? [facility] uses [facility] but [facility] said [facility] be [facility]. She would like to speak with you regarding this. Thanks

## 2018-02-03 NOTE — Telephone Encounter (Signed)
Do we have samples of the 165 CR?  I don't think that lyrica has gone generic yet but could be wrong.Marland KitchenMarland Kitchen

## 2018-02-05 DIAGNOSIS — Z794 Long term (current) use of insulin: Secondary | ICD-10-CM | POA: Diagnosis not present

## 2018-02-05 DIAGNOSIS — E1165 Type 2 diabetes mellitus with hyperglycemia: Secondary | ICD-10-CM | POA: Diagnosis not present

## 2018-02-06 LAB — COMPLETE METABOLIC PANEL WITH GFR
AG RATIO: 1.9 (calc) (ref 1.0–2.5)
ALBUMIN MSPROF: 4.2 g/dL (ref 3.6–5.1)
ALKALINE PHOSPHATASE (APISO): 65 U/L (ref 40–115)
ALT: 12 U/L (ref 9–46)
AST: 11 U/L (ref 10–35)
BILIRUBIN TOTAL: 0.4 mg/dL (ref 0.2–1.2)
BUN: 17 mg/dL (ref 7–25)
CHLORIDE: 101 mmol/L (ref 98–110)
CO2: 30 mmol/L (ref 20–32)
Calcium: 9.5 mg/dL (ref 8.6–10.3)
Creat: 1.11 mg/dL (ref 0.70–1.25)
GFR, Est African American: 78 mL/min/{1.73_m2} (ref >=60)
GFR, Est Non African American: 67 mL/min/{1.73_m2} (ref >=60)
GLUCOSE: 117 mg/dL — AB (ref 65–99)
Globulin: 2.2 g/dL (calc) (ref 1.9–3.7)
POTASSIUM: 5.6 mmol/L — AB (ref 3.5–5.3)
SODIUM: 139 mmol/L (ref 135–146)
TOTAL PROTEIN: 6.4 g/dL (ref 6.1–8.1)

## 2018-02-06 LAB — T4, FREE: Free T4: 1 ng/dL (ref 0.8–1.8)

## 2018-02-06 LAB — HEMOGLOBIN A1C
HEMOGLOBIN A1C: 8.4 %{Hb} — AB (ref <5.7)
Mean Plasma Glucose: 194 (calc)
eAG (mmol/L): 10.8 (calc)

## 2018-02-06 LAB — TSH: TSH: 2.09 mIU/L (ref 0.40–4.50)

## 2018-02-14 ENCOUNTER — Ambulatory Visit: Payer: PPO | Admitting: "Endocrinology

## 2018-02-14 ENCOUNTER — Encounter: Payer: Self-pay | Admitting: "Endocrinology

## 2018-02-14 VITALS — BP 127/77 | HR 89 | Ht 73.0 in | Wt 152.0 lb

## 2018-02-14 DIAGNOSIS — Z794 Long term (current) use of insulin: Secondary | ICD-10-CM | POA: Diagnosis not present

## 2018-02-14 DIAGNOSIS — E1165 Type 2 diabetes mellitus with hyperglycemia: Secondary | ICD-10-CM

## 2018-02-14 DIAGNOSIS — IMO0001 Reserved for inherently not codable concepts without codable children: Secondary | ICD-10-CM

## 2018-02-14 DIAGNOSIS — I10 Essential (primary) hypertension: Secondary | ICD-10-CM

## 2018-02-14 DIAGNOSIS — E782 Mixed hyperlipidemia: Secondary | ICD-10-CM

## 2018-02-14 MED ORDER — INSULIN GLARGINE 300 UNIT/ML ~~LOC~~ SOPN: 10.0000 [IU] | PEN_INJECTOR | Freq: Every day | SUBCUTANEOUS | 0 refills | Status: DC

## 2018-02-14 NOTE — Patient Instructions (Signed)

## 2018-02-14 NOTE — Progress Notes (Signed)
Subjective:    Patient ID: John Reese, male    DOB: 16-Jul-1950, PCP [name], MD   Past Medical History:  Diagnosis Date  . Auditory disturbance    Decreased auditory acuity  . 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 Diabetes ([place])   . Dyslipidemia   . Tremor   . Trigeminal neuralgia    Right, V2 distribution   Past Surgical History:  Procedure Laterality Date  . FINGER AMPUTATION    . INNER EAR SURGERY Right    Ear drum repair  . PULSE GENERATOR IMPLANT Bilateral 03/30/2016   Procedure: BILATERAL PLACEMENT OF IMPLANTABLE PULSE GENERATOR;  Surgeon: Erline Levine, MD;  Location: [facility];  Service: Neurosurgery;  Laterality: Bilateral;  BILATERAL PLACEMENT OF IMPLANTABLE PULSE GENERATOR  . SUBTHALAMIC STIMULATOR INSERTION Bilateral 03/22/2016   Procedure: BILATERAL DEEP BRAIN STIMULATOR PLACEMENT WITH STARFIX WITH DR. Carles Collet;  Surgeon: Erline Levine, MD;  Location: [facility];  Service: Neurosurgery;  Laterality: Bilateral;   Social History   Socioeconomic History  . Marital status: Married    Spouse name: Not on file  . Number of children: 2  . Years of education: HS  . Highest education level: Not on file  Occupational History  . Occupation: Works in Optometrist: [employer]  . Financial resource strain: Not on file  . Food insecurity:    Worry: Not on file    Inability: Not on file  . Transportation needs:    Medical: Not on file    Non-medical: Not on file  Tobacco Use  . Smoking status: Never Smoker  . Smokeless tobacco: Never Used  Substance and Sexual Activity  . Alcohol use: No  . Drug use: No  . Sexual activity: Not on file  Lifestyle  . Physical activity:    Days per week: Not on file    Minutes per session: Not on file  . Stress: Not on file  Relationships  . Social connections:    Talks on phone: Not on file    Gets together: Not on file    Attends religious service: Not on file     Active member of club or organization: Not on file    Attends meetings of clubs or organizations: Not on file    Relationship status: Not on file  Other Topics Concern  . Not on file  Social History Narrative   Patient is right handed.   Patient drinks 2 cups coffee daily.   Outpatient Encounter Medications as of 02/14/2018  Medication Sig  . aspirin EC 81 MG tablet Take 81 mg by mouth daily.  . carbamazepine (TEGRETOL) 200 MG tablet Take 200 mg by mouth as directed. Takes 1 tablet in the morning and 1 tablet in the evening  . carbamazepine (TEGRETOL) 200 MG tablet TAKE 2 TABLETS (400 MG TOTAL) BY MOUTH 2 (TWO) TIMES DAILY.  Marland Kitchen CINNAMON PO Take 1,000 mg by mouth daily.  . fish oil-omega-3 fatty acids 1000 MG capsule Take 2 g by mouth daily.  Marland Kitchen ibuprofen (ADVIL,MOTRIN) 200 MG tablet Take 800 mg by mouth as needed for moderate pain.  . Insulin Glargine (TOUJEO SOLOSTAR) 300 UNIT/ML SOPN Inject 10 Units as directed at bedtime.  . Multiple Vitamin (MULTIVITAMIN) tablet Take 1 tablet by mouth daily.  . pravastatin (PRAVACHOL) 40 MG tablet Take 40 mg by mouth daily.  . pregabalin (LYRICA) 75 MG capsule Take 1 capsule (75  mg total) by mouth 2 (two) times daily. Generic  . ramipril (ALTACE) 2.5 MG capsule Take 2.5 mg by mouth daily.  Marland Kitchen ULTICARE MINI PEN NEEDLES 31G X 6 MM MISC USE AS DIRECTED AT BEDTIME  . [DISCONTINUED] Insulin Glargine (TOUJEO SOLOSTAR [place]) Inject 10 Units into the skin at bedtime.  . [DISCONTINUED] TOUJEO SOLOSTAR 300 UNIT/ML SOPN INJECT 15 UNITS AS DIRECTED AT BEDTIME. (Patient taking differently: Inject 8 Units as directed at bedtime. )   No facility-administered encounter medications on file as of 02/14/2018.    ALLERGIES: Allergies  Allergen Reactions  . No Known Allergies    VACCINATION STATUS: There is no immunization history for the selected administration types on file for this patient.  Diabetes  He presents for his follow-up diabetic visit. He has type 2  diabetes mellitus. Onset time: He was diagnosed at approximate age of 34 years. His disease course has been worsening (He is diagnosed with essential tremors since last visit, following with neurology.). There are no hypoglycemic associated symptoms. Pertinent negatives for hypoglycemia include no confusion, headaches, pallor or seizures. There are no diabetic associated symptoms. Pertinent negatives for diabetes include no chest pain, no fatigue, no polydipsia, no polyphagia, no polyuria and no weakness. There are no hypoglycemic complications. Symptoms are worsening. There are no diabetic complications. Risk factors for coronary artery disease include diabetes mellitus, dyslipidemia, hypertension, male sex and sedentary lifestyle. Current diabetic treatment includes insulin injections and oral agent (monotherapy). He is compliant with treatment most of the time. His weight is increasing steadily. He is following a generally healthy diet. When asked about meal planning, he reported none. He has had a previous visit with a dietitian. He participates in exercise intermittently. His home blood glucose trend is decreasing steadily. His breakfast blood glucose range is generally 90-110 mg/dl. His overall blood glucose range is 90-110 mg/dl. An ACE inhibitor/angiotensin II receptor blocker is being taken.  Hyperlipidemia  This is a chronic problem. The current episode started more than 1 year ago. The problem is controlled. Exacerbating diseases include diabetes. Pertinent negatives include no chest pain, myalgias or shortness of breath. Current antihyperlipidemic treatment includes statins. Risk factors for coronary artery disease include diabetes mellitus, dyslipidemia, hypertension, male sex and a sedentary lifestyle.  Hypertension  This is a chronic problem. The current episode started more than 1 year ago. The problem is uncontrolled. Pertinent negatives include no chest pain, headaches, neck pain, palpitations  or shortness of breath. Risk factors for coronary artery disease include dyslipidemia and diabetes mellitus. Past treatments include ACE inhibitors.    Review of Systems  Constitutional: Negative for fatigue and unexpected weight change.  HENT: Negative for dental problem, mouth sores and trouble swallowing.   Eyes: Negative for visual disturbance.  Respiratory: Negative for cough, choking, chest tightness, shortness of breath and wheezing.   Cardiovascular: Negative for chest pain, palpitations and leg swelling.  Gastrointestinal: Negative for abdominal distention, abdominal pain, constipation, diarrhea, nausea and vomiting.  Endocrine: Negative for polydipsia, polyphagia and polyuria.  Genitourinary: Negative for dysuria, flank pain, hematuria and urgency.  Musculoskeletal: Negative for back pain, gait problem, myalgias and neck pain.  Skin: Negative for pallor, rash and wound.  Neurological: Negative for seizures, syncope, weakness, numbness and headaches.  Psychiatric/Behavioral: Negative.  Negative for confusion and dysphoric mood.    Objective:    BP 127/77   Pulse 89   Ht 6\' 1"  (1.854 m)   Wt 152 lb (68.9 kg)   BMI 20.05 kg/m  Wt Readings from Last 3 Encounters:  02/14/18 152 lb (68.9 kg)  10/28/17 141 lb (64 kg)  10/14/17 141 lb (64 kg)    Physical Exam  Constitutional: He is oriented to person, place, and time. He appears well-developed. He is cooperative. No distress.  HENT:  Head: Normocephalic and atraumatic.  Eyes: EOM are normal.  Neck: Normal range of motion. Neck supple. No tracheal deviation present. No thyromegaly present.  Cardiovascular: Normal rate, S1 normal and S2 normal. Exam reveals no gallop.  No murmur heard. Pulses:      Dorsalis pedis pulses are 2+ on the right side, and 2+ on the left side.       Posterior tibial pulses are 2+ on the right side, and 2+ on the left side.  Pulmonary/Chest: Effort normal. No respiratory distress. He has no  wheezes.  Abdominal: Soft. Bowel sounds are normal. He exhibits no distension. There is no tenderness. There is no guarding and no CVA tenderness.  Musculoskeletal: He exhibits no edema.       Right shoulder: He exhibits no swelling and no deformity.  Neurological: He is alert and oriented to person, place, and time. He has normal strength. No cranial nerve deficit or sensory deficit. Coordination and gait normal.     Skin: Skin is warm and dry. No rash noted. No cyanosis. Nails show no clubbing.  Psychiatric: He has a normal mood and affect. His speech is normal and behavior is normal. Judgment and thought content normal. Cognition and memory are normal.    Results for orders placed or performed in visit on 11/25/17  [facility] DIABETES EYE EXAM  Result Value Ref Range   [facility] Diabetic Eye Exam     Diabetic Labs (most recent): Lab Results  Component Value Date   HGBA1C 8.4 (H) 02/05/2018   HGBA1C 6.6 (H) 09/06/2017   HGBA1C 6.7 (H) 09/06/2017   Lipid Panel     Component Value Date/Time   CHOL 175 06/27/2015 0816   TRIG 99 06/27/2015 0816   HDL 42 06/27/2015 0816   CHOLHDL 4.2 06/27/2015 0816   VLDL 20 06/27/2015 0816   LDLCALC 113 06/27/2015 0816    Assessment & Plan:   1. Uncontrolled type 2 diabetes mellitus without complication, with long-term current use of insulin (HCC) -No gross complications from his diabetes so far, however patient remains at a high risk for more acute and chronic complications of diabetes which include CAD, CVA, CKD, retinopathy, and neuropathy. These are all discussed in detail with the patient.  Patient came with screening A1c of 8.4% from 6.6%.   -His logs still show his fasting glycemic profile is on target.  - I have re-counseled the patient on diet management  by adopting a carbohydrate restricted / protein rich  Diet.  -  Suggestion is made for him to avoid simple carbohydrates  from his diet including Cakes, Sweet Desserts / Pastries, Ice Cream,  Soda (diet and regular), Sweet Tea, Candies, Chips, Cookies, Store Bought Juices, Alcohol in Excess of  1-2 drinks a day, Artificial Sweeteners, and "Sugar-free" Products. This will help patient to have stable blood glucose profile and potentially avoid unintended weight gain.  - Patient is advised to stick to a routine mealtimes to eat 3 meals  a day and avoid unnecessary snacks ( to snack only to correct hypoglycemia).  - I have approached patient with the following individualized plan to manage diabetes and patient agrees.  -It was previously determined that his c-peptide is critically  low at 0.5, indicating pancreatic exhaustion.  He may very soon require prandial insulin in order for him to achieve control of diabetes to target. -In preparation, I advised him to increase Toujeo to 10 units nightly, start monitoring blood glucose daily before breakfast, and alternating days before lunch and before supper and return in 3 months with repeat labs. - His GAD antibodies are negative, however does not rule out a possibility of LADA.  -He is encouraged to call clinic if he is blood glucose readings are greater than 200 mg/dL x 3. He is not a candidate for incretin therapy.  - Patient specific target  for A1c; LDL, HDL, Triglycerides, and  Waist Circumference were discussed in detail.  2) BP/HTN: His blood pressure is controlled to target.  he is advised to continue current medications including ACEI/ARB. 3) Lipids/HPL: His recent lipid panel showed uncontrolled LDL at 113.  He is advised to continue pravastatin.   4)  Weight/Diet: He has gained 8 pounds since last visit, a good development for him.      exercise, and carbohydrates information provided.  5) Chronic Care/Health Maintenance:  -Patient is on ACEI/ARB and Statin medications and encouraged to continue to follow up with Ophthalmology, Podiatrist at least yearly or according to recommendations, and advised to  stay away from smoking. I  have recommended yearly flu vaccine and pneumonia vaccination at least every 5 years; moderate intensity exercise for up to 150 minutes weekly; and  sleep for at least 7 hours a day. - He was recently diagnosed with essential tremors. I advised him to continue follow-up with neurology.   - I advised patient to maintain close follow up with [name], MD for primary care needs.  - Time spent with the patient: 25 min, of which >50% was spent in reviewing his blood glucose logs , discussing his hypo- and hyper-glycemic episodes, reviewing his current and  previous labs and insulin doses and developing a plan to avoid hypo- and hyper-glycemia. Please refer to Patient Instructions for Blood Glucose Monitoring and Insulin/Medications Dosing Guide"  in media tab for additional information. [employer] participated in the discussions, expressed understanding, and voiced agreement with the above plans.  All questions were answered to his satisfaction. he is encouraged to contact clinic should he have any questions or concerns prior to his return visit.  Follow up plan: -Return in about 3 months (around 05/17/2018) for Meter, and Logs.  Glade Davi, MD Phone: [phone]  Fax: [phone]  -  This note was partially dictated with voice recognition software. Similar sounding words can be transcribed inadequately or may not  be corrected upon review.  02/14/2018, 3:19 PM

## 2018-03-04 DIAGNOSIS — E1159 Type 2 diabetes mellitus with other circulatory complications: Secondary | ICD-10-CM | POA: Diagnosis not present

## 2018-03-04 DIAGNOSIS — E1169 Type 2 diabetes mellitus with other specified complication: Secondary | ICD-10-CM | POA: Diagnosis not present

## 2018-03-04 DIAGNOSIS — Z0000 Encounter for general adult medical examination without abnormal findings: Secondary | ICD-10-CM | POA: Diagnosis not present

## 2018-03-04 DIAGNOSIS — E785 Hyperlipidemia, unspecified: Secondary | ICD-10-CM | POA: Diagnosis not present

## 2018-03-04 DIAGNOSIS — I10 Essential (primary) hypertension: Secondary | ICD-10-CM | POA: Diagnosis not present
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
## 2018-05-01 ENCOUNTER — Encounter: Payer: Self-pay | Admitting: Neurology

## 2018-05-01 ENCOUNTER — Ambulatory Visit: Payer: PPO | Admitting: Neurology

## 2018-05-01 VITALS — BP 112/60 | HR 78 | Ht 73.0 in | Wt 150.0 lb

## 2018-05-01 DIAGNOSIS — G250 Essential tremor: Secondary | ICD-10-CM

## 2018-05-01 DIAGNOSIS — Z9689 Presence of other specified functional implants: Secondary | ICD-10-CM

## 2018-05-01 DIAGNOSIS — G500 Trigeminal neuralgia: Secondary | ICD-10-CM | POA: Diagnosis not present

## 2018-05-01 NOTE — Procedures (Signed)
DBS Programming was performed.     Device was confirmed to be on.  Soft start was confirmed to be on.  Impedences were checked and were within normal limits.  Battery was checked and was determined to be functioning normally and not near the end of life (2.96 bilaterally).  Final settings were as follows:  Left brain electrode:     1-2+           ; Amplitude  2.7   V   ; Pulse width 90 microseconds (tried to lower to 60 but had more tremor);   Frequency   150   Hz.   Right brain electrode:     1-2+          ; Amplitude   2.4  V ;  Pulse width 90  microseconds;  Frequency   150    Hz.

## 2018-05-06 ENCOUNTER — Telehealth: Payer: Self-pay

## 2018-05-06 NOTE — Telephone Encounter (Signed)
Pt states he has had high BG readings.   Date Before breakfast Before lunch Before supper Bedtime  1/12 115 137    1/13 178  336   1/14 159 228 @ 10:30 a.m.            Pt taking: Toujeo 10 units qhs.    Pts wife calling stating that his BG is making him fall. I asked if he has had an hypoglycemia and she states that he hasnt. I advised her that he will need to see his PCP for the falls. She insist that its because his glucose is over 200. She believes he needs to have labs done and come in right away.

## 2018-05-06 NOTE — Telephone Encounter (Signed)
FYI  Pts wife notified and agrees with increasing insulin but is convinced that BG over 200 is causing several falls and states he does not need to see PCP.

## 2018-05-06 NOTE — Telephone Encounter (Signed)
You are right.  He has to be evaluated by his PMD for his falls as soon as possible.  His blood glucose readings of 200 are unlikely to cause his falls.  He can increase his Toujeo to 12 units nightly.

## 2018-05-08 DIAGNOSIS — J209 Acute bronchitis, unspecified: Secondary | ICD-10-CM | POA: Diagnosis not present

## 2018-05-08 DIAGNOSIS — R55 Syncope and collapse: Secondary | ICD-10-CM | POA: Diagnosis not present

## 2018-05-09 ENCOUNTER — Other Ambulatory Visit: Payer: Self-pay | Admitting: Neurology

## 2018-05-09 ENCOUNTER — Telehealth: Payer: Self-pay | Admitting: Neurology

## 2018-05-09 NOTE — Telephone Encounter (Signed)
John Reese, patient's wife called and has a question for you regarding his Stimulator and him falling a few times. She would like to talk with you before he sees his Heart Doctor. Please Call. Thanks

## 2018-05-09 NOTE — Telephone Encounter (Signed)
Spoke with patient's wife. She states this past Monday 05/06/2018 patient had two falls. No LOC. Did not hit his head. With last fall they did call EMS. Elevated blood sugar but no other abnormality. They saw PCP who advised follow up with Cardiology. They have evaluated with EKG. His Cardiologist advised to call to make sure it is not a setting on stimulator that is causing balance issues prior to putting Holter monitor on patient.  Please advise. They are aware Dr. Carles Collet out of office until Tuesday.

## 2018-05-12 ENCOUNTER — Telehealth: Payer: Self-pay

## 2018-05-12 DIAGNOSIS — E1165 Type 2 diabetes mellitus with hyperglycemia: Secondary | ICD-10-CM | POA: Diagnosis not present

## 2018-05-12 DIAGNOSIS — Z794 Long term (current) use of insulin: Secondary | ICD-10-CM | POA: Diagnosis not present

## 2018-05-12 NOTE — Telephone Encounter (Signed)
SENT REFERRAL TO SCHEDULING AND FILE NOTES 

## 2018-05-13 LAB — COMPLETE METABOLIC PANEL WITH GFR
AG Ratio: 1.6 (calc) (ref 1.0–2.5)
ALBUMIN MSPROF: 4.1 g/dL (ref 3.6–5.1)
ALT: 16 U/L (ref 9–46)
AST: 12 U/L (ref 10–35)
Alkaline phosphatase (APISO): 71 U/L (ref 40–115)
BUN: 18 mg/dL (ref 7–25)
CALCIUM: 9.6 mg/dL (ref 8.6–10.3)
CO2: 28 mmol/L (ref 20–32)
Chloride: 105 mmol/L (ref 98–110)
Creat: 0.96 mg/dL (ref 0.70–1.25)
GFR, EST AFRICAN AMERICAN: 93 mL/min/{1.73_m2} (ref >=60)
GFR, Est Non African American: 80 mL/min/{1.73_m2} (ref >=60)
GLUCOSE: 142 mg/dL — AB (ref 65–99)
Globulin: 2.5 g/dL (calc) (ref 1.9–3.7)
Potassium: 5.1 mmol/L (ref 3.5–5.3)
Sodium: 142 mmol/L (ref 135–146)
TOTAL PROTEIN: 6.6 g/dL (ref 6.1–8.1)
Total Bilirubin: 0.3 mg/dL (ref 0.2–1.2)

## 2018-05-13 LAB — HEMOGLOBIN A1C
Hgb A1c MFr Bld: 8.1 % of total Hgb — ABNORMAL HIGH (ref <5.7)
MEAN PLASMA GLUCOSE: 186 (calc)
eAG (mmol/L): 10.3 (calc)

## 2018-05-13 NOTE — Telephone Encounter (Signed)
I haven't changed his DBS settings in a long time so I don't think that it is any setting causing new falls.

## 2018-05-13 NOTE — Telephone Encounter (Signed)
Patient's wife made aware.

## 2018-05-14 ENCOUNTER — Ambulatory Visit: Payer: PPO | Admitting: Cardiology

## 2018-05-14 ENCOUNTER — Encounter: Payer: Self-pay | Admitting: Cardiology

## 2018-05-14 VITALS — BP 128/72 | HR 74 | Ht 73.0 in | Wt 147.8 lb

## 2018-05-14 DIAGNOSIS — R55 Syncope and collapse: Secondary | ICD-10-CM

## 2018-05-14 NOTE — Patient Instructions (Signed)
Medication Instructions:  The current medical regimen is effective;  continue present plan and medications.  If you need a refill on your cardiac medications before your next appointment, please call your pharmacy.   Testing/Procedures: Your physician has requested that you have an echocardiogram. Echocardiography is a painless test that uses sound waves to create images of your heart. It provides your doctor with information about the size and shape of your heart and how well your heart's chambers and valves are working. This procedure takes approximately one hour. There are no restrictions for this procedure.  Your physician has recommended that you wear an event monitor for 30 days. Event monitors are medical devices that record the heart's electrical activity. Doctors most often [place] these monitors to diagnose arrhythmias. Arrhythmias are problems with the speed or rhythm of the heartbeat. The monitor is a small, portable device. You can wear one while you do your normal daily activities. This is usually used to diagnose what is causing palpitations/syncope (passing out).  Follow-Up: Follow up will be based on the results of the above testing.  Thank you for choosing [facility]!!

## 2018-05-14 NOTE — Progress Notes (Signed)
Cardiology Office Note:    Date:  05/14/2018   ID:  John Reese, DOB Nov 26, 1950, MRN [id number]  PCP:  John Housekeeper, MD  Cardiologist:  John Furbish, MD  Electrophysiologist:  None   Referring MD: John Housekeeper, MD     History of Present Illness:    John Reese is a 68 y.o. male here for the evaluation of syncope at the request of Dr. Edrick Reese.  In review of office note from 05/08/2018 he saw Dr. Edrick Reese for falls.  Saw Dr. Carles Reese with neurology on 1/9.  After that visit, he got up brushed teeth, put on t shirt, he fell forward hit the chair, did not hurt head, tried to catch with left arm when on the floor he broke out in a cold sweat. 7am. Wife was in bed, asleep. Wife talked to him, I was sweating. 2nd time, noon, no lunch, told wife stomach was rolling,  fell again on edge of tub, no warning, walked to bathroom, number 1 and 2, wife helped him down, gurgling. [employer] daughter came over. Pale. Clammy soaking wet. No fever. CBG 437. Diastolic BP was 60.   EMT came out blood sugar was 380.  Blood pressure was normal.  Had no warning.  He fell forward.  He was trying to put on his close when he fell.  No bending over.  A second occurrence occurred around lunchtime.  This is when his wife called 911.  He states that neither time did he lose consciousness or hit his head.  His wife was talking to him during these instances.  He has not felt well.  Wanted to see me.   I previously saw him in 2017 secondary to shortness of breath.  Has a family history of CAD with his brother dying at age 77 from heart attack.  He had a nuclear stress test that was reassuring.  Coronary calcium was noted in the RCA and LAD.  He also has diabetes hyperlipidemia hypertension.  He takes carbamazepine, Lyrica, propranolol, pravastatin, gabapentin  CT scan was negative for PE.  He takes statin and aspirin.  He is also had Dr. Carles Reese place a brain stimulator.    Past Medical History:  Diagnosis Date  . Auditory  disturbance    Decreased auditory acuity  . 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 Diabetes ([place])   . Dyslipidemia   . Tremor   . Trigeminal neuralgia    Right, V2 distribution    Past Surgical History:  Procedure Laterality Date  . FINGER AMPUTATION    . INNER EAR SURGERY Right    Ear drum repair  . PULSE GENERATOR IMPLANT Bilateral 03/30/2016   Procedure: BILATERAL PLACEMENT OF IMPLANTABLE PULSE GENERATOR;  Surgeon: Erline Levine, MD;  Location: [place];  Service: Neurosurgery;  Laterality: Bilateral;  BILATERAL PLACEMENT OF IMPLANTABLE PULSE GENERATOR  . SUBTHALAMIC STIMULATOR INSERTION Bilateral 03/22/2016   Procedure: BILATERAL DEEP BRAIN STIMULATOR PLACEMENT WITH STARFIX WITH DR. Carles Reese;  Surgeon: Erline Levine, MD;  Location: [place];  Service: Neurosurgery;  Laterality: Bilateral;    Current Medications: Current Meds  Medication Sig  . aspirin EC 81 MG tablet Take 81 mg by mouth daily.  . carbamazepine (TEGRETOL) 200 MG tablet Take 1 tablet (200 mg total) by mouth 2 (two) times daily.  Marland Kitchen CINNAMON PO Take 1,000 mg by mouth daily.  . fish oil-omega-3 fatty acids 1000 MG capsule Take 2 g by mouth daily.  Marland Kitchen  ibuprofen (ADVIL,MOTRIN) 200 MG tablet Take 800 mg by mouth as needed for moderate pain.  . Insulin Glargine (TOUJEO SOLOSTAR) 300 UNIT/ML SOPN Inject 10 Units as directed at bedtime.  . Multiple Vitamin (MULTIVITAMIN) tablet Take 1 tablet by mouth daily.  . pravastatin (PRAVACHOL) 40 MG tablet Take 40 mg by mouth daily.  . pregabalin (LYRICA) 75 MG capsule Take 1 capsule (75 mg total) by mouth 2 (two) times daily. Generic  . ULTICARE MINI PEN NEEDLES 31G X 6 MM MISC USE AS DIRECTED AT BEDTIME     Allergies:   No known allergies   Social History   Socioeconomic History  . Marital status: Married    Spouse name: Not on file  . Number of children: 2  . Years of education: HS  . Highest education level: Not on file  Occupational  History  . Occupation: Works in Optometrist: [employer]  . Financial resource strain: Not on file  . Food insecurity:    Worry: Not on file    Inability: Not on file  . Transportation needs:    Medical: Not on file    Non-medical: Not on file  Tobacco Use  . Smoking status: Never Smoker  . Smokeless tobacco: Never Used  Substance and Sexual Activity  . Alcohol use: No  . Drug use: No  . Sexual activity: Not on file  Lifestyle  . Physical activity:    Days per week: Not on file    Minutes per session: Not on file  . Stress: Not on file  Relationships  . Social connections:    Talks on phone: Not on file    Gets together: Not on file    Attends religious service: Not on file    Active member of club or organization: Not on file    Attends meetings of clubs or organizations: Not on file    Relationship status: Not on file  Other Topics Concern  . Not on file  Social History Narrative   Patient is right handed.   Patient drinks 2 cups coffee daily.     Family History: The patient's family history includes Heart attack in his brother; Peripheral vascular disease in his sister; Stroke in his father and mother.  ROS:   Please see the history of present illness.     All other systems reviewed and are negative.  EKGs/Labs/Other Studies Reviewed:    The following studies were reviewed today:  Nuclear stress test 03/2014- reassuring low risk  EKG:  EKG is  ordered today.  The ekg ordered today demonstrates 05/14/2018 normal sinus rhythm 74 with no other abnormalities.  Recent Labs: 09/07/2017: Hemoglobin 10.7; Platelets 135 02/05/2018: TSH 2.09 05/12/2018: ALT 16; BUN 18; Creat 0.96; Potassium 5.1; Sodium 142  Recent Lipid Panel    Component Value Date/Time   CHOL 175 06/27/2015 0816   TRIG 99 06/27/2015 0816   HDL 42 06/27/2015 0816   CHOLHDL 4.2 06/27/2015 0816   VLDL 20 06/27/2015 0816   LDLCALC 113 06/27/2015 0816    Physical Exam:     VS:  BP 128/72   Pulse 74   Ht 6\' 1"  (1.854 m)   Wt 147 lb 12.8 oz (67 kg)   SpO2 96%   BMI 19.50 kg/m     Wt Readings from Last 3 Encounters:  05/14/18 147 lb 12.8 oz (67 kg)  05/01/18 150 lb (68 kg)  02/14/18 152 lb (68.9 kg)  GEN:  Well nourished, well developed in no acute distress HEENT: Normal NECK: No JVD; No carotid bruits LYMPHATICS: No lymphadenopathy CARDIAC: RRR, no murmurs, rubs, gallops RESPIRATORY:  Clear to auscultation without rales, wheezing or rhonchi  ABDOMEN: Soft, non-tender, non-distended MUSCULOSKELETAL:  No edema; No deformity  SKIN: Warm and dry NEUROLOGIC:  Alert and oriented x 3 PSYCHIATRIC:  Normal affect   ASSESSMENT:    1. Vasovagal syncope    PLAN:    In order of problems listed above:  Syncope vasovagal -Based upon his story, hypotension, sweats, pale, stomach upset, hyperglycemia, bronchitis is most likely that his episodes are related to vasovagal syncope.  I carefully explained the pathophysiology to he and his wife and daughter. -Since then, his ramipril has been stopped. -I encouraged hydration.  They do admit that they do not drink enough. -Just to exclude the possibility of significant arrhythmia, I will check a 30-day event monitor. -I will also check an echocardiogram to ensure proper structure and function of his heart. -  He recently had his brain stimulator interrogated by Dr. Carles Reese.  Has not had settings changed in quite some time.  She did not think that this was necessarily causing new falls.  Right trigeminal neuralgia - Tegretol, Lyrica  Essential tremor - Bilateral deep brain stimulator 03/22/2016.  He is very pleased with the results of this.  40 minutes of time spent with patient, review of medical records, discussion with family  Medication Adjustments/Labs and Tests Ordered: Current medicines are reviewed at length with the patient today.  Concerns regarding medicines are outlined above.  Orders Placed  This Encounter  Procedures  . CARDIAC EVENT MONITOR  . EKG 12-Lead  . ECHOCARDIOGRAM COMPLETE   No orders of the defined types were placed in this encounter.   Patient Instructions  Medication Instructions:  The current medical regimen is effective;  continue present plan and medications.  If you need a refill on your cardiac medications before your next appointment, please call your pharmacy.   Testing/Procedures: Your physician has requested that you have an echocardiogram. Echocardiography is a painless test that uses sound waves to create images of your heart. It provides your doctor with information about the size and shape of your heart and how well your heart's chambers and valves are working. This procedure takes approximately one hour. There are no restrictions for this procedure.  Your physician has recommended that you wear an event monitor for 30 days. Event monitors are medical devices that record the heart's electrical activity. Doctors most often [place] these monitors to diagnose arrhythmias. Arrhythmias are problems with the speed or rhythm of the heartbeat. The monitor is a small, portable device. You can wear one while you do your normal daily activities. This is usually used to diagnose what is causing palpitations/syncope (passing out).  Follow-Up: Follow up will be based on the results of the above testing.  Thank you for choosing [facility]!!        Signed, John Furbish, MD  05/14/2018 5:31 PM    [place]

## 2018-05-16 ENCOUNTER — Ambulatory Visit: Payer: PPO | Admitting: Cardiology

## 2018-05-19 ENCOUNTER — Encounter: Payer: Self-pay | Admitting: "Endocrinology

## 2018-05-19 ENCOUNTER — Ambulatory Visit: Payer: PPO | Admitting: "Endocrinology

## 2018-05-19 VITALS — BP 139/81 | HR 80 | Ht 73.0 in | Wt 152.0 lb

## 2018-05-19 DIAGNOSIS — I10 Essential (primary) hypertension: Secondary | ICD-10-CM | POA: Diagnosis not present

## 2018-05-19 DIAGNOSIS — Z794 Long term (current) use of insulin: Secondary | ICD-10-CM | POA: Diagnosis not present

## 2018-05-19 DIAGNOSIS — E1165 Type 2 diabetes mellitus with hyperglycemia: Secondary | ICD-10-CM

## 2018-05-19 DIAGNOSIS — E782 Mixed hyperlipidemia: Secondary | ICD-10-CM | POA: Diagnosis not present

## 2018-05-19 DIAGNOSIS — IMO0001 Reserved for inherently not codable concepts without codable children: Secondary | ICD-10-CM

## 2018-05-19 NOTE — Progress Notes (Signed)
Endocrinology follow-up note  Subjective:    Patient ID: John Reese, male    DOB: [birth year], PCP [name], MD   Past Medical History:  Diagnosis Date  . Auditory disturbance    Decreased auditory acuity  . 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 Diabetes ([place])   . Dyslipidemia   . Tremor   . Trigeminal neuralgia    Right, V2 distribution   Past Surgical History:  Procedure Laterality Date  . FINGER AMPUTATION    . INNER EAR SURGERY Right    Ear drum repair  . PULSE GENERATOR IMPLANT Bilateral 03/30/2016   Procedure: BILATERAL PLACEMENT OF IMPLANTABLE PULSE GENERATOR;  Surgeon: Erline Levine, MD;  Location: [place];  Service: Neurosurgery;  Laterality: Bilateral;  BILATERAL PLACEMENT OF IMPLANTABLE PULSE GENERATOR  . SUBTHALAMIC STIMULATOR INSERTION Bilateral 03/22/2016   Procedure: BILATERAL DEEP BRAIN STIMULATOR PLACEMENT WITH STARFIX WITH DR. Carles Collet;  Surgeon: Erline Levine, MD;  Location: [place];  Service: Neurosurgery;  Laterality: Bilateral;   Social History   Socioeconomic History  . Marital status: Married    Spouse name: Not on file  . Number of children: 2  . Years of education: HS  . Highest education level: Not on file  Occupational History  . 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 Financial resource strain: Not on file  . Food insecurity:    Worry: Not on file    Inability: Not on file  . Transportation needs:    Medical: Not on file    Non-medical: Not on file  Tobacco Use  . Smoking status: Never Smoker  . Smokeless tobacco: Never Used  Substance and Sexual Activity  . Alcohol use: No  . Drug use: No  . Sexual activity: Not on file  Lifestyle  . Physical activity:    Days per week: Not on file    Minutes per session: Not on file  . Stress: Not on file  Relationships  . Social connections:    Talks on phone: Not on file    Gets together: Not on file    Attends  religious service: Not on file    Active member of club or organization: Not on file    Attends meetings of clubs or organizations: Not on file    Relationship status: Not on file  Other Topics Concern  . Not on file  Social History Narrative   Patient is right handed.   Patient drinks 2 cups coffee daily.   Outpatient Encounter Medications as of 05/19/2018  Medication Sig  . Insulin Glargine, 1 Unit Dial, 300 UNIT/ML SOPN Inject 12 Units into the skin at bedtime.  Marland Kitchen aspirin EC 81 MG tablet Take 81 mg by mouth daily.  . carbamazepine (TEGRETOL) 200 MG tablet Take 1 tablet (200 mg total) by mouth 2 (two) times daily.  Marland Kitchen CINNAMON PO Take 1,000 mg by mouth daily.  . fish oil-omega-3 fatty acids 1000 MG capsule Take 2 g by mouth daily.  Marland Kitchen ibuprofen (ADVIL,MOTRIN) 200 MG tablet Take 800 mg by mouth as needed for moderate pain.  . Multiple Vitamin (MULTIVITAMIN) tablet Take 1 tablet by mouth daily.  . pravastatin (PRAVACHOL) 40 MG tablet Take 40 mg by mouth daily.  . pregabalin (LYRICA) 75 MG capsule Take 1 capsule (75 mg total) by mouth 2 (two) times daily. Generic  . ULTICARE MINI PEN NEEDLES 31G X 6 MM MISC USE  AS DIRECTED AT BEDTIME  . [DISCONTINUED] Insulin Glargine (TOUJEO SOLOSTAR) 300 UNIT/ML SOPN Inject 10 Units as directed at bedtime.   No facility-administered encounter medications on file as of 05/19/2018.    ALLERGIES: Allergies  Allergen Reactions  . No Known Allergies    VACCINATION STATUS: There is no immunization history for the selected administration types on file for this patient.  Diabetes  He presents for his follow-up diabetic visit. He has type 2 diabetes mellitus. Onset time: He was diagnosed at approximate age of 80 years. His disease course has been improving (He is diagnosed with essential tremors since last visit, following with neurology.). There are no hypoglycemic associated symptoms. Pertinent negatives for hypoglycemia include no confusion, headaches,  pallor or seizures. There are no diabetic associated symptoms. Pertinent negatives for diabetes include no chest pain, no fatigue, no polydipsia, no polyphagia, no polyuria and no weakness. There are no hypoglycemic complications. Symptoms are improving. There are no diabetic complications. Risk factors for coronary artery disease include diabetes mellitus, dyslipidemia, hypertension, male sex and sedentary lifestyle. Current diabetic treatment includes insulin injections and oral agent (monotherapy). He is compliant with treatment most of the time. His weight is stable. He is following a generally healthy diet. When asked about meal planning, he reported none. He has had a previous visit with a dietitian. He participates in exercise intermittently. His home blood glucose trend is decreasing steadily. His breakfast blood glucose range is generally 110-130 mg/dl. His lunch blood glucose range is generally 140-180 mg/dl. His dinner blood glucose range is generally 140-180 mg/dl. His bedtime blood glucose range is generally 140-180 mg/dl. His overall blood glucose range is 140-180 mg/dl. An ACE inhibitor/angiotensin II receptor blocker is being taken.  Hyperlipidemia  This is a chronic problem. The current episode started more than 1 year ago. The problem is controlled. Exacerbating diseases include diabetes. Pertinent negatives include no chest pain, myalgias or shortness of breath. Current antihyperlipidemic treatment includes statins. Risk factors for coronary artery disease include diabetes mellitus, dyslipidemia, hypertension, male sex and a sedentary lifestyle.  Hypertension  This is a chronic problem. The current episode started more than 1 year ago. The problem is uncontrolled. Pertinent negatives include no chest pain, headaches, neck pain, palpitations or shortness of breath. Risk factors for coronary artery disease include dyslipidemia and diabetes mellitus. Past treatments include ACE inhibitors.     Review of Systems  Constitutional: Negative for fatigue and unexpected weight change.  HENT: Negative for dental problem, mouth sores and trouble swallowing.   Eyes: Negative for visual disturbance.  Respiratory: Negative for cough, choking, chest tightness, shortness of breath and wheezing.   Cardiovascular: Negative for chest pain, palpitations and leg swelling.  Gastrointestinal: Negative for abdominal distention, abdominal pain, constipation, diarrhea, nausea and vomiting.  Endocrine: Negative for polydipsia, polyphagia and polyuria.  Genitourinary: Negative for dysuria, flank pain, hematuria and urgency.  Musculoskeletal: Negative for back pain, gait problem, myalgias and neck pain.  Skin: Negative for pallor, rash and wound.  Neurological: Negative for seizures, syncope, weakness, numbness and headaches.  Psychiatric/Behavioral: Negative.  Negative for confusion and dysphoric mood.    Objective:    BP 139/81   Pulse 80   Ht 6\' 1"  (1.854 m)   Wt 152 lb (68.9 kg)   BMI 20.05 kg/m   Wt Readings from Last 3 Encounters:  05/19/18 152 lb (68.9 kg)  05/14/18 147 lb 12.8 oz (67 kg)  05/01/18 150 lb (68 kg)    Physical Exam  Constitutional:  He is oriented to person, place, and time. He appears well-developed. He is cooperative. No distress.  HENT:  Head: Normocephalic and atraumatic.  Eyes: EOM are normal.  Neck: Normal range of motion. Neck supple. No tracheal deviation present. No thyromegaly present.  Cardiovascular: Normal rate, S1 normal and S2 normal. Exam reveals no gallop.  No murmur heard. Pulses:      Dorsalis pedis pulses are 2+ on the right side and 2+ on the left side.       Posterior tibial pulses are 2+ on the right side and 2+ on the left side.  Pulmonary/Chest: Effort normal. No respiratory distress. He has no wheezes.  Abdominal: Soft. Bowel sounds are normal. He exhibits no distension. There is no abdominal tenderness. There is no guarding and no CVA  tenderness.  Musculoskeletal:        General: No edema.     Right shoulder: He exhibits no swelling and no deformity.  Neurological: He is alert and oriented to person, place, and time. He has normal strength. No cranial nerve deficit or sensory deficit. Coordination and gait normal.     Skin: Skin is warm and dry. No rash noted. No cyanosis. Nails show no clubbing.  Psychiatric: He has a normal mood and affect. His speech is normal and behavior is normal. Judgment and thought content normal. Cognition and memory are normal.    Results for orders placed or performed in visit on 02/14/18  Hemoglobin A1c  Result Value Ref Range   Hgb A1c MFr Bld 8.1 (H) <5.7 % of total Hgb   Mean Plasma Glucose 186 (calc)   eAG (mmol/L) 10.3 (calc)  COMPLETE METABOLIC PANEL WITH GFR  Result Value Ref Range   Glucose, Bld 142 (H) 65 - 99 mg/dL   BUN 18 7 - 25 mg/dL   Creat 0.96 0.70 - 1.25 mg/dL   GFR, Est Non African American 80 > OR = 60 mL/min/1.87m2   GFR, Est African American 93 > OR = 60 mL/min/1.48m2   BUN/Creatinine Ratio NOT APPLICABLE 6 - 22 (calc)   Sodium 142 135 - 146 mmol/L   Potassium 5.1 3.5 - 5.3 mmol/L   Chloride 105 98 - 110 mmol/L   CO2 28 20 - 32 mmol/L   Calcium 9.6 8.6 - 10.3 mg/dL   Total Protein 6.6 6.1 - 8.1 g/dL   Albumin 4.1 3.6 - 5.1 g/dL   Globulin 2.5 1.9 - 3.7 g/dL (calc)   AG Ratio 1.6 1.0 - 2.5 (calc)   Total Bilirubin 0.3 0.2 - 1.2 mg/dL   Alkaline phosphatase (APISO) 71 40 - 115 U/L   AST 12 10 - 35 U/L   ALT 16 9 - 46 U/L   Diabetic Labs (most recent): Lab Results  Component Value Date   HGBA1C 8.1 (H) 05/12/2018   HGBA1C 8.4 (H) 02/05/2018   HGBA1C 6.6 (H) 09/06/2017   Lipid Panel     Component Value Date/Time   CHOL 175 06/27/2015 0816   TRIG 99 06/27/2015 0816   HDL 42 06/27/2015 0816   CHOLHDL 4.2 06/27/2015 0816   VLDL 20 06/27/2015 0816   LDLCALC 113 06/27/2015 0816    Assessment & Plan:   1. Uncontrolled type 2 diabetes mellitus  without complication, with long-term current use of insulin (HCC) -No gross complications from his diabetes so far, however patient remains at a high risk for more acute and chronic complications of diabetes which include CAD, CVA, CKD, retinopathy, and neuropathy. These are all  discussed in detail with the patient.  Patient came with slightly better A1c of 8.1% from 8.4%.    -His logs still show his fasting glycemic profile is on target.  - I have re-counseled the patient on diet management  by adopting a carbohydrate restricted / protein rich  Diet.  -  Suggestion is made for him to avoid simple carbohydrates  from his diet including Cakes, Sweet Desserts / Pastries, Ice Cream, Soda (diet and regular), Sweet Tea, Candies, Chips, Cookies, Store Bought Juices, Alcohol in Excess of  1-2 drinks a day, Artificial Sweeteners, and "Sugar-free" Products. This will help patient to have stable blood glucose profile and potentially avoid unintended weight gain.   - Patient is advised to stick to a routine mealtimes to eat 3 meals  a day and avoid unnecessary snacks ( to snack only to correct hypoglycemia).  - I have approached patient with the following individualized plan to manage diabetes and patient agrees.  -It was previously determined that his c-peptide is critically low at 0.5, indicating pancreatic exhaustion.  He may very soon require prandial insulin in order for him to achieve control of diabetes to target. -This patient has a propensity to fall, recently suspected of having vasovagal syncope.  Prior to #1 would be to avoid hypoglycemia. -He is advised to continue Toujeo 12 units nightly, continue monitoring blood glucose 4 times daily-before meals and at bedtime.   -He is encouraged to call clinic if he is blood glucose readings are greater than 200 mg/dL x 3. - His GAD antibodies are negative, however does not rule out a possibility of LADA.   He is not a candidate for incretin  therapy.  - Patient specific target  for A1c; LDL, HDL, Triglycerides, and  Waist Circumference were discussed in detail.  2) BP/HTN: His blood pressure is controlled to target.   he is advised to continue current medications including ACEI/ARB. 3) Lipids/HPL: His recent lipid panel showed uncontrolled LDL at 113.  He is advised to continue pravastatin.   4)  Weight/Diet: He has gained 8 pounds since last visit, a good development for him.      exercise, and carbohydrates information provided.  5) Chronic Care/Health Maintenance:  -Patient is on ACEI/ARB and Statin medications and encouraged to continue to follow up with Ophthalmology, Podiatrist at least yearly or according to recommendations, and advised to  stay away from smoking. I have recommended yearly flu vaccine and pneumonia vaccination at least every 5 years; moderate intensity exercise for up to 150 minutes weekly; and  sleep for at least 7 hours a day. - He was recently diagnosed with essential tremors. I advised him to continue follow-up with neurology.   - I advised patient to maintain close follow up with [name], MD for primary care needs.  - Time spent with the patient: 25 min, of which >50% was spent in reviewing his blood glucose logs , discussing his hypoglycemia and hyperglycemia episodes, reviewing his current and  previous labs / studies and medications  doses and developing a plan to avoid hypoglycemia and hyperglycemia. Please refer to Patient Instructions for Blood Glucose Monitoring and Insulin/Medications Dosing Guide"  in media tab for additional information. [employer] participated in the discussions, expressed understanding, and voiced agreement with the above plans.  All questions were answered to his satisfaction. he is encouraged to contact clinic should he have any questions or concerns prior to his return visit.   Follow up plan: -Return in about  3 months (around 08/18/2018) for Meter, and  Logs.  Glade Raymund, MD Phone: [phone]  Fax: [phone]  -  This note was partially dictated with voice recognition software. Similar sounding words can be transcribed inadequately or may not  be corrected upon review.  05/19/2018, 1:24 PM

## 2018-05-29 ENCOUNTER — Ambulatory Visit (HOSPITAL_COMMUNITY): Payer: PPO | Attending: Cardiovascular Disease

## 2018-05-29 ENCOUNTER — Ambulatory Visit (INDEPENDENT_AMBULATORY_CARE_PROVIDER_SITE_OTHER): Payer: PPO

## 2018-05-29 DIAGNOSIS — R55 Syncope and collapse: Secondary | ICD-10-CM

## 2018-06-02 ENCOUNTER — Telehealth: Payer: Self-pay

## 2018-06-02 NOTE — Telephone Encounter (Signed)
-----   Message from Jerline Pain, MD sent at 06/02/2018  1:25 PM EST ----- Thankfully, normal left ventricular ejection fraction.  Excellent echocardiogram. Candee Furbish, MD

## 2018-06-02 NOTE — Telephone Encounter (Signed)
Notes recorded by Frederik Schmidt, RN on 06/02/2018 at 1:40 PM EST The patient has been notified of the Echo result and verbalized understanding. All questions (if any) were answered. Frederik Schmidt, RN 06/02/2018 1:39 PM

## 2018-07-07 ENCOUNTER — Telehealth: Payer: Self-pay

## 2018-07-07 NOTE — Telephone Encounter (Signed)
Notes recorded by Frederik Schmidt, RN on 07/07/2018 at 9:09 AM EDT Left patient's wife message with results.3/16 ------

## 2018-07-07 NOTE — Telephone Encounter (Signed)
-----   Message from Jerline Pain, MD sent at 07/07/2018  8:51 AM EDT ----- Reassuring monitor. Normal rhythm. No AFIB, no pauses Candee Furbish, MD

## 2018-09-04 DIAGNOSIS — E1159 Type 2 diabetes mellitus with other circulatory complications: Secondary | ICD-10-CM | POA: Diagnosis not present

## 2018-09-04 DIAGNOSIS — E1169 Type 2 diabetes mellitus with other specified complication: Secondary | ICD-10-CM | POA: Diagnosis not present

## 2018-09-04 DIAGNOSIS — E785 Hyperlipidemia, unspecified: Secondary | ICD-10-CM | POA: Diagnosis not present

## 2018-09-04 DIAGNOSIS — I10 Essential (primary) hypertension: Secondary | ICD-10-CM | POA: Diagnosis not present

## 2018-09-12 ENCOUNTER — Other Ambulatory Visit: Payer: Self-pay | Admitting: Neurology

## 2018-09-12 MED ORDER — PREGABALIN 75 MG PO CAPS: 75.0000 mg | ORAL_CAPSULE | Freq: Two times a day (BID) | ORAL | 0 refills | Status: DC

## 2018-09-12 NOTE — Telephone Encounter (Signed)
Provider approved 

## 2018-09-12 NOTE — Telephone Encounter (Signed)
Requested Prescriptions   Pending Prescriptions Disp Refills  . pregabalin (LYRICA) 75 MG capsule [Pharmacy Med Name: Pregabalin 75 MG Oral Capsule] 180 capsule 0    Sig: Take 1 capsule by mouth twice daily   Rx last filled:02/03/18 #180 1 REFILL  Pt last seen: 05/01/18  Follow up appt scheduled: 10/31/18  OFFICE NOTE 05/01/18 Assessment/Plan:   1.  Essential Tremor.             -The patient underwent bilateral VIM DBS on 03/22/2016.               -doing well with DBS therapy.  Reviewed DBS today.  Battery looks great.  2.  R trigeminal neuragia             -He is on tegretol 200 mg bid             -Continue Lyrica, 75 mg twice per day.  He is really doing well considering that it is the winter and he usually does worse in the winter.  3.  Diabetic peripheral neuropathy             -talked about safety  4.  Weight loss             -His metformin was just recently stopped by endocrinology because of this.  5.  Follow-up in 6 months, sooner should new neurologic issues arise.

## 2018-09-23 DIAGNOSIS — E1165 Type 2 diabetes mellitus with hyperglycemia: Secondary | ICD-10-CM | POA: Diagnosis not present

## 2018-09-23 DIAGNOSIS — Z794 Long term (current) use of insulin: Secondary | ICD-10-CM | POA: Diagnosis not present

## 2018-09-23 DIAGNOSIS — E782 Mixed hyperlipidemia: Secondary | ICD-10-CM | POA: Diagnosis not present

## 2018-09-24 LAB — COMPLETE METABOLIC PANEL WITH GFR
AG Ratio: 1.8 (calc) (ref 1.0–2.5)
ALBUMIN MSPROF: 4.1 g/dL (ref 3.6–5.1)
ALT: 18 U/L (ref 9–46)
AST: 15 U/L (ref 10–35)
Alkaline phosphatase (APISO): 65 U/L (ref 35–144)
BILIRUBIN TOTAL: 0.5 mg/dL (ref 0.2–1.2)
BUN: 22 mg/dL (ref 7–25)
CHLORIDE: 104 mmol/L (ref 98–110)
CO2: 30 mmol/L (ref 20–32)
Calcium: 9.6 mg/dL (ref 8.6–10.3)
Creat: 1.02 mg/dL (ref 0.70–1.25)
GFR, EST AFRICAN AMERICAN: 87 mL/min/{1.73_m2} (ref >=60)
GFR, Est Non African American: 75 mL/min/{1.73_m2} (ref >=60)
GLOBULIN: 2.3 g/dL (ref 1.9–3.7)
GLUCOSE: 137 mg/dL — AB (ref 65–99)
Potassium: 4.8 mmol/L (ref 3.5–5.3)
Sodium: 139 mmol/L (ref 135–146)
TOTAL PROTEIN: 6.4 g/dL (ref 6.1–8.1)

## 2018-09-24 LAB — LIPID PANEL
CHOL/HDL RATIO: 3.4 (calc) (ref <5.0)
Cholesterol: 162 mg/dL (ref <200)
HDL: 47 mg/dL (ref >=40)
LDL CHOLESTEROL (CALC): 100 mg/dL — AB
Non-HDL Cholesterol (Calc): 115 mg/dL (calc) (ref <130)
Triglycerides: 66 mg/dL (ref <150)

## 2018-09-24 LAB — HEMOGLOBIN A1C
Hgb A1c MFr Bld: 7.5 % of total Hgb — ABNORMAL HIGH (ref <5.7)
Mean Plasma Glucose: 169 (calc)
eAG (mmol/L): 9.3 (calc)

## 2018-10-01 ENCOUNTER — Ambulatory Visit: Payer: PPO | Admitting: "Endocrinology

## 2018-10-02 ENCOUNTER — Encounter: Payer: Self-pay | Admitting: "Endocrinology

## 2018-10-02 ENCOUNTER — Other Ambulatory Visit: Payer: Self-pay

## 2018-10-02 ENCOUNTER — Ambulatory Visit (INDEPENDENT_AMBULATORY_CARE_PROVIDER_SITE_OTHER): Payer: PPO | Admitting: "Endocrinology

## 2018-10-02 VITALS — BP 132/70 | HR 68 | Ht 73.0 in | Wt 148.0 lb

## 2018-10-02 DIAGNOSIS — Z794 Long term (current) use of insulin: Secondary | ICD-10-CM | POA: Diagnosis not present

## 2018-10-02 DIAGNOSIS — E782 Mixed hyperlipidemia: Secondary | ICD-10-CM

## 2018-10-02 DIAGNOSIS — E1165 Type 2 diabetes mellitus with hyperglycemia: Secondary | ICD-10-CM

## 2018-10-02 DIAGNOSIS — IMO0001 Reserved for inherently not codable concepts without codable children: Secondary | ICD-10-CM

## 2018-10-02 MED ORDER — INSULIN GLARGINE (1 UNIT DIAL) 300 UNIT/ML ~~LOC~~ SOPN: 12.0000 [IU] | PEN_INJECTOR | Freq: Every day | SUBCUTANEOUS | 3 refills | Status: DC

## 2018-10-02 NOTE — Progress Notes (Signed)
Endocrinology follow-up note  Subjective:    Patient ID: John Reese, male    DOB: 1951-02-01, PCP [name], MD   Past Medical History:  Diagnosis Date  . Auditory disturbance    Decreased auditory acuity  . 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 Diabetes ([place])   . Dyslipidemia   . Tremor   . Trigeminal neuralgia    Right, V2 distribution   Past Surgical History:  Procedure Laterality Date  . FINGER AMPUTATION    . INNER EAR SURGERY Right    Ear drum repair  . PULSE GENERATOR IMPLANT Bilateral 03/30/2016   Procedure: BILATERAL PLACEMENT OF IMPLANTABLE PULSE GENERATOR;  Surgeon: Erline Levine, MD;  Location: [facility];  Service: Neurosurgery;  Laterality: Bilateral;  BILATERAL PLACEMENT OF IMPLANTABLE PULSE GENERATOR  . SUBTHALAMIC STIMULATOR INSERTION Bilateral 03/22/2016   Procedure: BILATERAL DEEP BRAIN STIMULATOR PLACEMENT WITH STARFIX WITH DR. Carles Collet;  Surgeon: Erline Levine, MD;  Location: [facility];  Service: Neurosurgery;  Laterality: Bilateral;   Social History   Socioeconomic History  . Marital status: Married    Spouse name: Not on file  . Number of children: 2  . Years of education: HS  . Highest education level: Not on file  Occupational History  . Occupation: Works in Optometrist: [employer]  . Financial resource strain: Not on file  . Food insecurity    Worry: Not on file    Inability: Not on file  . Transportation needs    Medical: Not on file    Non-medical: Not on file  Tobacco Use  . Smoking status: Never Smoker  . Smokeless tobacco: Never Used  Substance and Sexual Activity  . Alcohol use: No  . Drug use: No  . Sexual activity: Not on file  Lifestyle  . Physical activity    Days per week: Not on file    Minutes per session: Not on file  . Stress: Not on file  Relationships  . Social Herbalist on phone: Not on file    Gets together: Not on file    Attends  religious service: Not on file    Active member of club or organization: Not on file    Attends meetings of clubs or organizations: Not on file    Relationship status: Not on file  Other Topics Concern  . Not on file  Social History Narrative   Patient is right handed.   Patient drinks 2 cups coffee daily.   Outpatient Encounter Medications as of 10/02/2018  Medication Sig  . aspirin EC 81 MG tablet Take 81 mg by mouth daily.  . carbamazepine (TEGRETOL) 200 MG tablet Take 1 tablet (200 mg total) by mouth 2 (two) times daily.  Marland Kitchen CINNAMON PO Take 1,000 mg by mouth daily.  . fish oil-omega-3 fatty acids 1000 MG capsule Take 2 g by mouth daily.  Marland Kitchen ibuprofen (ADVIL,MOTRIN) 200 MG tablet Take 800 mg by mouth as needed for moderate pain.  . Insulin Glargine, 1 Unit Dial, 300 UNIT/ML SOPN Inject 12 Units into the skin at bedtime.  . Multiple Vitamin (MULTIVITAMIN) tablet Take 1 tablet by mouth daily.  . pravastatin (PRAVACHOL) 40 MG tablet Take 40 mg by mouth daily.  . pregabalin (LYRICA) 75 MG capsule Take 1 capsule (75 mg total) by mouth 2 (two) times daily.  Marland Kitchen ULTICARE MINI PEN NEEDLES 31G X 6 MM MISC USE AS  DIRECTED AT BEDTIME  . [DISCONTINUED] Insulin Glargine, 1 Unit Dial, 300 UNIT/ML SOPN Inject 12 Units into the skin at bedtime.   No facility-administered encounter medications on file as of 10/02/2018.    ALLERGIES: Allergies  Allergen Reactions  . No Known Allergies    VACCINATION STATUS: There is no immunization history for the selected administration types on file for this patient.  Diabetes He presents for his follow-up diabetic visit. He has type 2 diabetes mellitus. Onset time: He was diagnosed at approximate age of 61 years. His disease course has been improving (He is diagnosed with essential tremors since last visit, following with neurology.). There are no hypoglycemic associated symptoms. Pertinent negatives for hypoglycemia include no confusion, headaches, pallor or  seizures. There are no diabetic associated symptoms. Pertinent negatives for diabetes include no chest pain, no fatigue, no polydipsia, no polyphagia, no polyuria and no weakness. There are no hypoglycemic complications. Symptoms are improving. There are no diabetic complications. Risk factors for coronary artery disease include diabetes mellitus, dyslipidemia, hypertension, male sex and sedentary lifestyle. Current diabetic treatment includes insulin injections and oral agent (monotherapy). He is compliant with treatment most of the time. His weight is stable. He is following a generally healthy diet. When asked about meal planning, he reported none. He has had a previous visit with a dietitian. He participates in exercise intermittently. His home blood glucose trend is decreasing steadily. His breakfast blood glucose range is generally 140-180 mg/dl. His bedtime blood glucose range is generally 140-180 mg/dl. His overall blood glucose range is 140-180 mg/dl. An ACE inhibitor/angiotensin II receptor blocker is being taken.  Hyperlipidemia This is a chronic problem. The current episode started more than 1 year ago. The problem is controlled. Exacerbating diseases include diabetes. Pertinent negatives include no chest pain, myalgias or shortness of breath. Current antihyperlipidemic treatment includes statins. Risk factors for coronary artery disease include diabetes mellitus, dyslipidemia, hypertension, male sex and a sedentary lifestyle.  Hypertension This is a chronic problem. The current episode started more than 1 year ago. The problem is uncontrolled. Pertinent negatives include no chest pain, headaches, neck pain, palpitations or shortness of breath. Risk factors for coronary artery disease include dyslipidemia and diabetes mellitus. Past treatments include ACE inhibitors.    Review of Systems  Constitutional: Negative for fatigue and unexpected weight change.  HENT: Negative for dental problem, mouth  sores and trouble swallowing.   Eyes: Negative for visual disturbance.  Respiratory: Negative for cough, choking, chest tightness, shortness of breath and wheezing.   Cardiovascular: Negative for chest pain, palpitations and leg swelling.  Gastrointestinal: Negative for abdominal distention, abdominal pain, constipation, diarrhea, nausea and vomiting.  Endocrine: Negative for polydipsia, polyphagia and polyuria.  Genitourinary: Negative for dysuria, flank pain, hematuria and urgency.  Musculoskeletal: Negative for back pain, gait problem, myalgias and neck pain.  Skin: Negative for pallor, rash and wound.  Neurological: Negative for seizures, syncope, weakness, numbness and headaches.  Psychiatric/Behavioral: Negative.  Negative for confusion and dysphoric mood.    Objective:    BP 132/70   Pulse 68   Ht 6\' 1"  (1.854 m)   Wt 148 lb (67.1 kg)   BMI 19.53 kg/m   Wt Readings from Last 3 Encounters:  10/02/18 148 lb (67.1 kg)  05/19/18 152 lb (68.9 kg)  05/14/18 147 lb 12.8 oz (67 kg)    Physical Exam  Constitutional: He is oriented to person, place, and time. He appears well-developed. He is cooperative. No distress.  HENT:  Head:  Normocephalic and atraumatic.  Eyes: EOM are normal.  Neck: Normal range of motion. Neck supple. No tracheal deviation present. No thyromegaly present.  Cardiovascular: Normal rate, S1 normal and S2 normal. Exam reveals no gallop.  No murmur heard. Pulses:      Dorsalis pedis pulses are 2+ on the right side and 2+ on the left side.       Posterior tibial pulses are 2+ on the right side and 2+ on the left side.  Pulmonary/Chest: Effort normal. No respiratory distress. He has no wheezes.  Abdominal: Soft. Bowel sounds are normal. He exhibits no distension. There is no abdominal tenderness. There is no guarding and no CVA tenderness.  Musculoskeletal:        General: No edema.     Right shoulder: He exhibits no swelling and no deformity.  Neurological:  He is alert and oriented to person, place, and time. He has normal strength. No cranial nerve deficit or sensory deficit. Coordination and gait normal.     Skin: Skin is warm and dry. No rash noted. No cyanosis. Nails show no clubbing.  Psychiatric: He has a normal mood and affect. His speech is normal and behavior is normal. Judgment and thought content normal. Cognition and memory are normal.    Results for orders placed or performed in visit on 05/19/18  Hemoglobin A1c  Result Value Ref Range   Hgb A1c MFr Bld 7.5 (H) <5.7 % of total Hgb   Mean Plasma Glucose 169 (calc)   eAG (mmol/L) 9.3 (calc)  COMPLETE METABOLIC PANEL WITH GFR  Result Value Ref Range   Glucose, Bld 137 (H) 65 - 99 mg/dL   BUN 22 7 - 25 mg/dL   Creat 1.02 0.70 - 1.25 mg/dL   GFR, Est Non African American 75 > OR = 60 mL/min/1.6m2   GFR, Est African American 87 > OR = 60 mL/min/1.66m2   BUN/Creatinine Ratio NOT APPLICABLE 6 - 22 (calc)   Sodium 139 135 - 146 mmol/L   Potassium 4.8 3.5 - 5.3 mmol/L   Chloride 104 98 - 110 mmol/L   CO2 30 20 - 32 mmol/L   Calcium 9.6 8.6 - 10.3 mg/dL   Total Protein 6.4 6.1 - 8.1 g/dL   Albumin 4.1 3.6 - 5.1 g/dL   Globulin 2.3 1.9 - 3.7 g/dL (calc)   AG Ratio 1.8 1.0 - 2.5 (calc)   Total Bilirubin 0.5 0.2 - 1.2 mg/dL   Alkaline phosphatase (APISO) 65 35 - 144 U/L   AST 15 10 - 35 U/L   ALT 18 9 - 46 U/L  Lipid panel  Result Value Ref Range   Cholesterol 162 <200 mg/dL   HDL 47 > OR = 40 mg/dL   Triglycerides 66 <150 mg/dL   LDL Cholesterol (Calc) 100 (H) mg/dL (calc)   Total CHOL/HDL Ratio 3.4 <5.0 (calc)   Non-HDL Cholesterol (Calc) 115 <130 mg/dL (calc)   Diabetic Labs (most recent): Lab Results  Component Value Date   HGBA1C 7.5 (H) 09/23/2018   HGBA1C 8.1 (H) 05/12/2018   HGBA1C 8.4 (H) 02/05/2018   Lipid Panel     Component Value Date/Time   CHOL 162 09/23/2018 0749   TRIG 66 09/23/2018 0749   HDL 47 09/23/2018 0749   CHOLHDL 3.4 09/23/2018 0749    VLDL 20 06/27/2015 0816   LDLCALC 100 (H) 09/23/2018 0749    Assessment & Plan:   1. Uncontrolled type 2 diabetes mellitus without complication, with long-term current use of  insulin (HCC) -No gross complications from his diabetes so far, however patient remains at a high risk for more acute and chronic complications of diabetes which include CAD, CVA, CKD, retinopathy, and neuropathy. These are all discussed in detail with the patient.  -He returns with near target glycemic profile both fasting and postprandial, and A1c of 7.5% overall improving from 8.4%.    - I have re-counseled the patient on diet management  by adopting a carbohydrate restricted / protein rich  Diet.  - he  admits there is a room for improvement in his diet and drink choices. -  Suggestion is made for him to avoid simple carbohydrates  from his diet including Cakes, Sweet Desserts / Pastries, Ice Cream, Soda (diet and regular), Sweet Tea, Candies, Chips, Cookies, Sweet Pastries,  Store Bought Juices, Alcohol in Excess of  1-2 drinks a day, Artificial Sweeteners, Coffee Creamer, and "Sugar-free" Products. This will help patient to have stable blood glucose profile and potentially avoid unintended weight gain.   - Patient is advised to stick to a routine mealtimes to eat 3 meals  a day and avoid unnecessary snacks ( to snack only to correct hypoglycemia).  - I have approached patient with the following individualized plan to manage diabetes and patient agrees.  -It was previously determined that his c-peptide is critically low at 0.5, indicating pancreatic exhaustion.  He may very soon require prandial insulin in order for him to achieve control of diabetes to target. -This patient has a propensity to fall, recently suspected of having vasovagal syncope.  Prior to #1 would be to avoid hypoglycemia. -He is advised to continue Toujeo 12 units nightly, continue to monitor blood glucose twice a day-daily before breakfast and at  bedtime.    -He is encouraged to call clinic if he is blood glucose readings are greater than 200 mg/dL x 3. - His GAD antibodies are negative, however does not rule out a possibility of LADA.   He is not a candidate for incretin therapy.  - Patient specific target  for A1c; LDL, HDL, Triglycerides, and  Waist Circumference were discussed in detail.  2) BP/HTN: His blood pressure is controlled to target.   He is advised to continue current medications including ACEI/ARB. 3) Lipids/HPL: His recent lipid panel showed uncontrolled LDL at 113.  He is advised to continue pravastatin.   4)  Weight/Diet: He has gained 8 pounds since last visit, a good development for him.      exercise, and carbohydrates information provided.  5) Chronic Care/Health Maintenance:  -Patient is on ACEI/ARB and Statin medications and encouraged to continue to follow up with Ophthalmology, Podiatrist at least yearly or according to recommendations, and advised to  stay away from smoking. I have recommended yearly flu vaccine and pneumonia vaccination at least every 5 years; moderate intensity exercise for up to 150 minutes weekly; and  sleep for at least 7 hours a day. - He was recently diagnosed with essential tremors. I advised him to continue follow-up with neurology.   - I advised patient to maintain close follow up with [name], MD for primary care needs.  - Time spent with the patient: 25 min, of which >50% was spent in reviewing his blood glucose logs , discussing his hypoglycemia and hyperglycemia episodes, reviewing his current and  previous labs / studies and medications  doses and developing a plan to avoid hypoglycemia and hyperglycemia. Please refer to Patient Instructions for Blood Glucose Monitoring and Insulin/Medications Dosing Guide"  in media tab for additional information. Please  also refer to " Patient Self Inventory" in the Media  tab for reviewed elements of pertinent patient  history.  [employer] participated in the discussions, expressed understanding, and voiced agreement with the above plans.  All questions were answered to his satisfaction. he is encouraged to contact clinic should he have any questions or concerns prior to his return visit.   Follow up plan: -Return in about 4 months (around 02/01/2019) for Follow up with Pre-visit Labs, Meter, and Logs.  Glade Winfield, MD Phone: [phone]  Fax: [phone]  -  This note was partially dictated with voice recognition software. Similar sounding words can be transcribed inadequately or may not  be corrected upon review.  10/02/2018, 12:53 PM

## 2018-10-31 ENCOUNTER — Ambulatory Visit: Payer: PPO | Admitting: Neurology
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
## 2018-11-04 ENCOUNTER — Other Ambulatory Visit: Payer: Self-pay

## 2018-11-04 ENCOUNTER — Ambulatory Visit (INDEPENDENT_AMBULATORY_CARE_PROVIDER_SITE_OTHER): Payer: PPO | Admitting: Neurology

## 2018-11-04 ENCOUNTER — Encounter: Payer: Self-pay | Admitting: Neurology

## 2018-11-04 VITALS — BP 155/78 | HR 85 | Temp 98.1°F | Ht 73.0 in | Wt 149.8 lb

## 2018-11-04 DIAGNOSIS — G250 Essential tremor: Secondary | ICD-10-CM

## 2018-11-04 DIAGNOSIS — G500 Trigeminal neuralgia: Secondary | ICD-10-CM

## 2018-11-04 DIAGNOSIS — R55 Syncope and collapse: Secondary | ICD-10-CM | POA: Diagnosis not present

## 2018-11-04 MED ORDER — CARBAMAZEPINE 200 MG PO TABS: 200.0000 mg | ORAL_TABLET | Freq: Two times a day (BID) | ORAL | 1 refills | Status: DC

## 2018-11-04 NOTE — Procedures (Signed)
DBS Programming was performed.     Device was confirmed to be on.  Soft start was confirmed to be on.  Impedences were checked and were within normal limits.  Battery was checked and was determined to be functioning normally and not near the end of life (2.95 on the left and 2.94 on the right).  Final settings were as follows:  Left brain electrode:     1-2+           ; Amplitude  2.7   V   ; Pulse width 90 microseconds (tried to lower to 60 but had more tremor);   Frequency   150   Hz.   Right brain electrode:     1-2+          ; Amplitude   2.4  V ;  Pulse width 90  microseconds;  Frequency   150    Hz.

## 2018-12-18 ENCOUNTER — Other Ambulatory Visit: Payer: Self-pay | Admitting: Neurology

## 2019-01-01 ENCOUNTER — Ambulatory Visit: Payer: PPO | Admitting: Family Medicine

## 2019-01-27 DIAGNOSIS — E1165 Type 2 diabetes mellitus with hyperglycemia: Secondary | ICD-10-CM | POA: Diagnosis not present

## 2019-01-27 DIAGNOSIS — Z794 Long term (current) use of insulin: Secondary | ICD-10-CM | POA: Diagnosis not present

## 2019-01-28 LAB — COMPLETE METABOLIC PANEL WITH GFR
AG Ratio: 1.7 (calc) (ref 1.0–2.5)
ALT: 16 U/L (ref 9–46)
AST: 14 U/L (ref 10–35)
Albumin: 4 g/dL (ref 3.6–5.1)
Alkaline phosphatase (APISO): 52 U/L (ref 35–144)
BUN: 19 mg/dL (ref 7–25)
CO2: 31 mmol/L (ref 20–32)
Calcium: 9.4 mg/dL (ref 8.6–10.3)
Chloride: 103 mmol/L (ref 98–110)
Creat: 1.04 mg/dL (ref 0.70–1.18)
GFR, Est African American: 84 mL/min/{1.73_m2} (ref >=60)
GFR, Est Non African American: 72 mL/min/{1.73_m2} (ref >=60)
Globulin: 2.3 g/dL (calc) (ref 1.9–3.7)
Glucose, Bld: 106 mg/dL — ABNORMAL HIGH (ref 65–99)
Potassium: 4.4 mmol/L (ref 3.5–5.3)
Sodium: 139 mmol/L (ref 135–146)
Total Bilirubin: 0.4 mg/dL (ref 0.2–1.2)
Total Protein: 6.3 g/dL (ref 6.1–8.1)

## 2019-01-28 LAB — HEMOGLOBIN A1C
Hgb A1c MFr Bld: 7.8 % of total Hgb — ABNORMAL HIGH (ref <5.7)
Mean Plasma Glucose: 177 (calc)
eAG (mmol/L): 9.8 (calc)

## 2019-02-02 ENCOUNTER — Other Ambulatory Visit: Payer: Self-pay

## 2019-02-02 ENCOUNTER — Ambulatory Visit (INDEPENDENT_AMBULATORY_CARE_PROVIDER_SITE_OTHER): Payer: PPO | Admitting: "Endocrinology

## 2019-02-02 ENCOUNTER — Encounter: Payer: Self-pay | Admitting: "Endocrinology

## 2019-02-02 DIAGNOSIS — E1165 Type 2 diabetes mellitus with hyperglycemia: Secondary | ICD-10-CM | POA: Diagnosis not present

## 2019-02-02 DIAGNOSIS — I10 Essential (primary) hypertension: Secondary | ICD-10-CM

## 2019-02-02 DIAGNOSIS — E782 Mixed hyperlipidemia: Secondary | ICD-10-CM | POA: Diagnosis not present

## 2019-02-02 NOTE — Progress Notes (Signed)
02/02/2019                                                    Endocrinology Telehealth Visit Follow up Note -During COVID -19 Pandemic  This visit type was conducted due to national recommendations for restrictions regarding the COVID-19 Pandemic  in an effort to limit this patient's exposure and mitigate transmission of the corona virus.  Due to his co-morbid illnesses, John Reese is at  moderate to high risk for complications without adequate follow up.  This format is felt to be most appropriate for him at this time.  I connected with this patient on 02/02/2019   by telephone and verified that I am speaking with the correct person using two identifiers. John Reese, 06-22-1950. he has verbally consented to this visit. All issues noted in this document were discussed and addressed. The format was not optimal for physical exam.    Subjective:    Patient ID: John Reese, male    DOB: 09-Aug-1950, PCP [name], Fransisca Kaufmann, MD   Past Medical History:  Diagnosis Date  . Auditory disturbance    Decreased auditory acuity  . 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 Diabetes ([place])   . Dyslipidemia   . Tremor   . Trigeminal neuralgia    Right, V2 distribution   Past Surgical History:  Procedure Laterality Date  . FINGER AMPUTATION    . INNER EAR SURGERY Right    Ear drum repair  . PULSE GENERATOR IMPLANT Bilateral 03/30/2016   Procedure: BILATERAL PLACEMENT OF IMPLANTABLE PULSE GENERATOR;  Surgeon: Erline Levine, MD;  Location: [facility];  Service: Neurosurgery;  Laterality: Bilateral;  BILATERAL PLACEMENT OF IMPLANTABLE PULSE GENERATOR  . SUBTHALAMIC STIMULATOR INSERTION Bilateral 03/22/2016   Procedure: BILATERAL DEEP BRAIN STIMULATOR PLACEMENT WITH STARFIX WITH DR. Carles Collet;  Surgeon: Erline Levine, MD;  Location: [facility];  Service: Neurosurgery;  Laterality: Bilateral;   Social History   Socioeconomic History  . Marital status: Married     Spouse name: Not on file  . Number of children: 2  . Years of education: HS  . Highest education level: 12th grade  Occupational History  . 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 Financial resource strain: Not on file  . Food insecurity    Worry: Not on file    Inability: Not on file  . Transportation needs    Medical: Not on file    Non-medical: Not on file  Tobacco Use  . Smoking status: Never Smoker  . Smokeless tobacco: Never Used  Substance and Sexual Activity  . Alcohol use: No  . Drug use: No  . Sexual activity: Not on file  Lifestyle  . Physical activity    Days per week: Not on file    Minutes per session: Not on file  . Stress: Not on file  Relationships  . Social Herbalist on phone: Not on file    Gets together: Not on file    Attends religious service: Not on file    Active member of club or organization: Not on file    Attends meetings of clubs or organizations: Not on file    Relationship status: Not on  file  Other Topics Concern  . Not on file  Social History Narrative   Patient is right handed.   Patient drinks 2 cups coffee daily.   Outpatient Encounter Medications as of 02/02/2019  Medication Sig  . aspirin EC 81 MG tablet Take 81 mg by mouth daily.  . carbamazepine (TEGRETOL) 200 MG tablet Take 1 tablet (200 mg total) by mouth 2 (two) times daily.  Marland Kitchen CINNAMON PO Take 1,000 mg by mouth daily.  . fish oil-omega-3 fatty acids 1000 MG capsule Take 2 g by mouth daily.  Marland Kitchen ibuprofen (ADVIL,MOTRIN) 200 MG tablet Take 800 mg by mouth as needed for moderate pain.  . Insulin Glargine, 1 Unit Dial, 300 UNIT/ML SOPN Inject 12 Units into the skin at bedtime.  . Multiple Vitamin (MULTIVITAMIN) tablet Take 1 tablet by mouth daily.  . pravastatin (PRAVACHOL) 40 MG tablet Take 40 mg by mouth daily.  . pregabalin (LYRICA) 75 MG capsule Take 1 capsule by mouth twice daily  . ULTICARE MINI PEN NEEDLES 31G  X 6 MM MISC USE AS DIRECTED AT BEDTIME   No facility-administered encounter medications on file as of 02/02/2019.    ALLERGIES: Allergies  Allergen Reactions  . No Known Allergies    VACCINATION STATUS: There is no immunization history for the selected administration types on file for this patient.  Diabetes He presents for his follow-up diabetic visit. He has type 2 diabetes mellitus. Onset time: He was diagnosed at approximate age of 64 years. His disease course has been stable (He is diagnosed with essential tremors since last visit, following with neurology.). There are no hypoglycemic associated symptoms. Pertinent negatives for hypoglycemia include no confusion, headaches, pallor or seizures. There are no diabetic associated symptoms. Pertinent negatives for diabetes include no chest pain, no fatigue, no polydipsia, no polyphagia, no polyuria and no weakness. There are no hypoglycemic complications. Symptoms are stable. There are no diabetic complications. Risk factors for coronary artery disease include diabetes mellitus, dyslipidemia, hypertension, male sex and sedentary lifestyle. Current diabetic treatment includes insulin injections and oral agent (monotherapy). He is compliant with treatment most of the time. His weight is stable. He is following a generally healthy diet. When asked about meal planning, he reported none. He has had a previous visit with a dietitian. He participates in exercise intermittently. His home blood glucose trend is decreasing steadily. His breakfast blood glucose range is generally 110-130 mg/dl. His bedtime blood glucose range is generally 180-200 mg/dl. His overall blood glucose range is 140-180 mg/dl. An ACE inhibitor/angiotensin II receptor blocker is being taken.  Hyperlipidemia This is a chronic problem. The current episode started more than 1 year ago. The problem is controlled. Exacerbating diseases include diabetes. Pertinent negatives include no chest  pain, myalgias or shortness of breath. Current antihyperlipidemic treatment includes statins. Risk factors for coronary artery disease include diabetes mellitus, dyslipidemia, hypertension, male sex and a sedentary lifestyle.  Hypertension This is a chronic problem. The current episode started more than 1 year ago. The problem is uncontrolled. Pertinent negatives include no chest pain, headaches, neck pain, palpitations or shortness of breath. Risk factors for coronary artery disease include dyslipidemia and diabetes mellitus. Past treatments include ACE inhibitors.   Review of systems: Limited as above.  Objective:    There were no vitals taken for this visit.  Wt Readings from Last 3 Encounters:  11/04/18 149 lb 12.8 oz (67.9 kg)  10/02/18 148 lb (67.1 kg)  05/19/18 152 lb (68.9 kg)  Results for orders placed or performed in visit on 10/02/18  Hemoglobin A1c  Result Value Ref Range   Hgb A1c MFr Bld 7.8 (H) <5.7 % of total Hgb   Mean Plasma Glucose 177 (calc)   eAG (mmol/L) 9.8 (calc)  COMPLETE METABOLIC PANEL WITH GFR  Result Value Ref Range   Glucose, Bld 106 (H) 65 - 99 mg/dL   BUN 19 7 - 25 mg/dL   Creat 1.04 0.70 - 1.18 mg/dL   GFR, Est Non African American 72 > OR = 60 mL/min/1.82m2   GFR, Est African American 84 > OR = 60 mL/min/1.83m2   BUN/Creatinine Ratio NOT APPLICABLE 6 - 22 (calc)   Sodium 139 135 - 146 mmol/L   Potassium 4.4 3.5 - 5.3 mmol/L   Chloride 103 98 - 110 mmol/L   CO2 31 20 - 32 mmol/L   Calcium 9.4 8.6 - 10.3 mg/dL   Total Protein 6.3 6.1 - 8.1 g/dL   Albumin 4.0 3.6 - 5.1 g/dL   Globulin 2.3 1.9 - 3.7 g/dL (calc)   AG Ratio 1.7 1.0 - 2.5 (calc)   Total Bilirubin 0.4 0.2 - 1.2 mg/dL   Alkaline phosphatase (APISO) 52 35 - 144 U/L   AST 14 10 - 35 U/L   ALT 16 9 - 46 U/L   Diabetic Labs (most recent): Lab Results  Component Value Date   HGBA1C 7.8 (H) 01/27/2019   HGBA1C 7.5 (H) 09/23/2018   HGBA1C 8.1 (H) 05/12/2018   Lipid Panel      Component Value Date/Time   CHOL 162 09/23/2018 0749   TRIG 66 09/23/2018 0749   HDL 47 09/23/2018 0749   CHOLHDL 3.4 09/23/2018 0749   VLDL 20 06/27/2015 0816   LDLCALC 100 (H) 09/23/2018 0749    Assessment & Plan:   1. Uncontrolled type 2 diabetes mellitus without complication, with long-term current use of insulin (HCC) -No gross complications from his diabetes so far, however patient remains at a high risk for more acute and chronic complications of diabetes which include CAD, CVA, CKD, retinopathy, and neuropathy. These are all discussed in detail with the patient.  -He reports tightly controlled fasting glycemic profile, safe bedtime blood glucose readings.  His A1c is 7.8% overall improving.    - I have re-counseled the patient on diet management  by adopting a carbohydrate restricted / protein rich  Diet.  - he  admits there is a room for improvement in his diet and drink choices. -  Suggestion is made for him to avoid simple carbohydrates  from his diet including Cakes, Sweet Desserts / Pastries, Ice Cream, Soda (diet and regular), Sweet Tea, Candies, Chips, Cookies, Sweet Pastries,  Store Bought Juices, Alcohol in Excess of  1-2 drinks a day, Artificial Sweeteners, Coffee Creamer, and "Sugar-free" Products. This will help patient to have stable blood glucose profile and potentially avoid unintended weight gain.  - Patient is advised to stick to a routine mealtimes to eat 3 meals  a day and avoid unnecessary snacks ( to snack only to correct hypoglycemia).  - I have approached patient with the following individualized plan to manage diabetes and patient agrees.  -It was previously determined that his c-peptide is critically low at 0.5, indicating pancreatic exhaustion.  He may very soon require intensive treatment with basal/bolus insulin in order for him to maintain control of diabetes to target.     -This patient has a propensity to fall, recently suspected of having  vasovagal syncope.  Priority #1 would be to avoid hypoglycemia. -He is advised to continue Toujeo 12 units nightly,  continue to monitor blood glucose twice a day-daily before breakfast and at bedtime.    -He is encouraged to call clinic if he is blood glucose readings are greater than 200 mg/dL x 3. - His GAD antibodies are negative, however does not rule out a possibility of LADA.   He is not a candidate for incretin therapy.  - Patient specific target  for A1c; LDL, HDL, Triglycerides, and  Waist Circumference were discussed in detail.  2) BP/HTN: he is advised to home monitor blood pressure and report if > 140/90 on 2 separate readings.  He is not currently on antihypertensive medication.  3) Lipids/HPL: His recent lipid panel showed uncontrolled LDL at 113.  He is advised to continue pravastatin 40 mg p.o. nightly.     4)  Weight/Diet: He has gained 8 pounds since last visit, a good development for him.      exercise, and carbohydrates information provided.  5) Chronic Care/Health Maintenance:  -Patient is on ACEI/ARB and Statin medications and encouraged to continue to follow up with Ophthalmology, Podiatrist at least yearly or according to recommendations, and advised to  stay away from smoking. I have recommended yearly flu vaccine and pneumonia vaccination at least every 5 years; moderate intensity exercise for up to 150 minutes weekly; and  sleep for at least 7 hours a day. - He was recently diagnosed with essential tremors. I advised him to continue follow-up with neurology.   - I advised patient to maintain close follow up with [name], Fransisca Kaufmann, MD for primary care needs.  - Patient Care Time Today:  25 min, of which >50% was spent in  counseling and the rest reviewing his  current and  previous labs/studies, previous treatments, his blood glucose readings, and medications' doses and developing a plan for long-term care based on the latest recommendations for standards of  care.   John Reese participated in the discussions, expressed understanding, and voiced agreement with the above plans.  All questions were answered to his satisfaction. he is encouraged to contact clinic should he have any questions or concerns prior to his return visit.    Follow up plan: -Return in about 4 months (around 06/05/2019) for Bring Meter and Logs- A1c in Office.  Glade Arville, MD Phone: [phone]  Fax: [phone]  -  This note was partially dictated with voice recognition software. Similar sounding words can be transcribed inadequately or may not  be corrected upon review.  02/02/2019, 12:37 PM

## 2019-02-05 ENCOUNTER — Other Ambulatory Visit: Payer: Self-pay

## 2019-02-05 ENCOUNTER — Encounter: Payer: Self-pay | Admitting: Family Medicine

## 2019-02-05 ENCOUNTER — Ambulatory Visit (INDEPENDENT_AMBULATORY_CARE_PROVIDER_SITE_OTHER): Payer: PPO | Admitting: Family Medicine

## 2019-02-05 VITALS — BP 141/81 | HR 78 | Temp 98.0°F | Ht 73.0 in | Wt 154.6 lb

## 2019-02-05 DIAGNOSIS — E1169 Type 2 diabetes mellitus with other specified complication: Secondary | ICD-10-CM | POA: Diagnosis not present

## 2019-02-05 DIAGNOSIS — Z1159 Encounter for screening for other viral diseases: Secondary | ICD-10-CM | POA: Diagnosis not present

## 2019-02-05 DIAGNOSIS — E782 Mixed hyperlipidemia: Secondary | ICD-10-CM

## 2019-02-05 DIAGNOSIS — I10 Essential (primary) hypertension: Secondary | ICD-10-CM | POA: Diagnosis not present

## 2019-02-05 DIAGNOSIS — Z794 Long term (current) use of insulin: Secondary | ICD-10-CM

## 2019-02-05 MED ORDER — PRAVASTATIN SODIUM 40 MG PO TABS: 40.0000 mg | ORAL_TABLET | Freq: Every day | ORAL | 3 refills | Status: DC

## 2019-02-05 NOTE — Progress Notes (Signed)
BP (!) 141/81   Pulse 78   Temp 98 F (36.7 C) (Temporal)   Ht 6\' 1"  (1.854 m)   Wt 154 lb 9.6 oz (70.1 kg)   SpO2 100%   BMI 20.40 kg/m    Subjective:    Patient ID: John Reese, male    DOB: [birth year], 68 y.o.   MRN: [id number]  HPI: John Reese is a 68 y.o. male presenting on 02/05/2019 for New Patient (Initial Visit) ([place]) and Establish Care   HPI Hyperlipidemia Patient is coming in for recheck of his hyperlipidemia. The patient is currently taking pravastatin. They deny any issues with myalgias or history of liver damage from it. They deny any focal numbness or weakness or chest pain.   Type 2 diabetes mellitus Patient sees Dr. Edrick Oh for this patient comes in today for recheck of his diabetes. Patient has been currently taking glargine 12 units daily. Patient is not currently on an ACE inhibitor/ARB. Patient has not seen an ophthalmologist this year. Patient denies any issues with their feet.   Hypertension Patient had issues with ACE inhibitor before where his blood pressure got down too much and he was falling and was lightheaded so he is not on one currently patient is currently on medication, and their blood pressure today is 141/81. Patient denies any lightheadedness or dizziness. Patient denies headaches, blurred vision, chest pains, shortness of breath, or weakness. Denies any side effects from medication and is content with current medication.   Relevant past medical, surgical, family and social history reviewed and updated as indicated. Interim medical history since our last visit reviewed. Allergies and medications reviewed and updated.  Review of Systems  Constitutional: Negative for chills and fever.  HENT: Negative for ear pain and tinnitus.   Eyes: Negative for pain and discharge.  Respiratory: Negative for cough, shortness of breath and wheezing.   Cardiovascular: Negative for chest pain, palpitations and leg swelling.  Gastrointestinal:  Negative for abdominal pain, blood in stool, constipation and diarrhea.  Genitourinary: Negative for dysuria and hematuria.  Musculoskeletal: Negative for back pain, gait problem and myalgias.  Skin: Negative for rash.  Neurological: Negative for dizziness, weakness and headaches.  Psychiatric/Behavioral: Negative for suicidal ideas.  All other systems reviewed and are negative.   Per HPI unless specifically indicated above  Social History   Socioeconomic History  . Marital status: Married    Spouse name: Not on file  . Number of children: 2  . Years of education: HS  . Highest education level: 12th grade  Occupational History  . 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 Financial resource strain: Not on file  . Food insecurity    Worry: Not on file    Inability: Not on file  . Transportation needs    Medical: Not on file    Non-medical: Not on file  Tobacco Use  . Smoking status: Never Smoker  . Smokeless tobacco: Never Used  Substance and Sexual Activity  . Alcohol use: No  . Drug use: No  . Sexual activity: Not on file  Lifestyle  . Physical activity    Days per week: Not on file    Minutes per session: Not on file  . Stress: Not on file  Relationships  . Social Herbalist on phone: Not on file    Gets together: Not on file    Attends religious service: Not  on file    Active member of club or organization: Not on file    Attends meetings of clubs or organizations: Not on file    Relationship status: Not on file  . Intimate partner violence    Fear of current or ex partner: Not on file    Emotionally abused: Not on file    Physically abused: Not on file    Forced sexual activity: Not on file  Other Topics Concern  . Not on file  Social History Narrative   Patient is right handed.   Patient drinks 2 cups coffee daily.    Past Surgical History:  Procedure Laterality Date  . FINGER AMPUTATION     . INNER EAR SURGERY Right    Ear drum repair  . PULSE GENERATOR IMPLANT Bilateral 03/30/2016   Procedure: BILATERAL PLACEMENT OF IMPLANTABLE PULSE GENERATOR;  Surgeon: Erline Levine, MD;  Location: [facility];  Service: Neurosurgery;  Laterality: Bilateral;  BILATERAL PLACEMENT OF IMPLANTABLE PULSE GENERATOR  . SUBTHALAMIC STIMULATOR INSERTION Bilateral 03/22/2016   Procedure: BILATERAL DEEP BRAIN STIMULATOR PLACEMENT WITH STARFIX WITH DR. Carles Collet;  Surgeon: Erline Levine, MD;  Location: [facility];  Service: Neurosurgery;  Laterality: Bilateral;    Family History  Problem Relation Age of Onset  . Stroke Father   . Peripheral vascular disease Sister   . Heart attack Brother   . Stroke Mother   . Healthy Brother   . Healthy Brother   . Healthy Brother   . Healthy Brother   . Healthy Sister   . Healthy Daughter   . Healthy Son     Allergies as of 02/05/2019      Reactions   No Known Allergies       Medication List       Accurate as of February 05, 2019 10:17 AM. If you have any questions, ask your nurse or doctor.        aspirin EC 81 MG tablet Take 81 mg by mouth daily.   carbamazepine 200 MG tablet Commonly known as: TEGRETOL Take 1 tablet (200 mg total) by mouth 2 (two) times daily.   CINNAMON PO Take 1,000 mg by mouth daily.   fish oil-omega-3 fatty acids 1000 MG capsule Take 2 g by mouth daily.   ibuprofen 200 MG tablet Commonly known as: ADVIL Take 800 mg by mouth as needed for moderate pain.   Insulin Glargine (1 Unit Dial) 300 UNIT/ML Sopn Inject 12 Units into the skin at bedtime.   multivitamin tablet Take 1 tablet by mouth daily.   pravastatin 40 MG tablet Commonly known as: PRAVACHOL Take 1 tablet (40 mg total) by mouth daily.   pregabalin 75 MG capsule Commonly known as: LYRICA Take 1 capsule by mouth twice daily   UltiCare Mini Pen Needles 31G X 6 MM Misc Generic drug: Insulin Pen Needle USE AS DIRECTED AT BEDTIME          Objective:    BP (!)  141/81   Pulse 78   Temp 98 F (36.7 C) (Temporal)   Ht 6\' 1"  (1.854 m)   Wt 154 lb 9.6 oz (70.1 kg)   SpO2 100%   BMI 20.40 kg/m   Wt Readings from Last 3 Encounters:  02/05/19 154 lb 9.6 oz (70.1 kg)  11/04/18 149 lb 12.8 oz (67.9 kg)  10/02/18 148 lb (67.1 kg)    Physical Exam Vitals signs and nursing note reviewed.  Constitutional:      General: He is not  in acute distress.    Appearance: He is well-developed. He is not diaphoretic.  Eyes:     General: No scleral icterus.    Conjunctiva/sclera: Conjunctivae normal.  Neck:     Musculoskeletal: Neck supple.     Thyroid: No thyromegaly.  Cardiovascular:     Rate and Rhythm: Normal rate and regular rhythm.     Heart sounds: Normal heart sounds. No murmur.  Pulmonary:     Effort: Pulmonary effort is normal. No respiratory distress.     Breath sounds: Normal breath sounds. No wheezing.  Musculoskeletal: Normal range of motion.  Lymphadenopathy:     Cervical: No cervical adenopathy.  Skin:    General: Skin is warm and dry.     Findings: No rash.  Neurological:     Mental Status: He is alert and oriented to person, place, and time.     Coordination: Coordination normal.  Psychiatric:        Behavior: Behavior normal.         Assessment & Plan:   Problem List Items Addressed This Visit      Cardiovascular and Mediastinum   Essential hypertension   Relevant Medications   pravastatin (PRAVACHOL) 40 MG tablet     Endocrine   Type 2 diabetes mellitus with other specified complication (HCC) - Primary   Relevant Medications   pravastatin (PRAVACHOL) 40 MG tablet     Other   Mixed hyperlipidemia   Relevant Medications   pravastatin (PRAVACHOL) 40 MG tablet    Other Visit Diagnoses    Need for hepatitis C screening test       Relevant Orders   Hepatitis C antibody      Continue pravastatin and insulin, suggested may be discussing with Dr. Oda Kilts to help control his mealtime sugars that will not cause  him to lose weight. Follow up plan: Return in about 6 months (around 08/06/2019), or if symptoms worsen or fail to improve, for Cholesterol hypertension and diabetes recheck.  Caryl Pina, MD [facility] 02/05/2019, 10:17 AM

## 2019-02-27 ENCOUNTER — Telehealth: Payer: Self-pay | Admitting: Neurology

## 2019-02-27 NOTE — Telephone Encounter (Signed)
What is his current dosage 75 mg q day or bid?  He is always a little worse in the winter.  Also, what is his currently dose of carbamazepine?

## 2019-02-27 NOTE — Telephone Encounter (Signed)
Increase lyrica to 75 mg tid ONLY FOR ONE MONTH.  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  The generic is fine

## 2019-02-27 NOTE — Telephone Encounter (Signed)
Patient states he takes the generic Lyrica bid. He said he has tried taking a extra pill with the second dose and still no change. Carbamazepine he is taking bid as well. He is very worried about the fact this is generic and he does not know what else to do

## 2019-02-27 NOTE — Telephone Encounter (Signed)
Advised patient that generic is fine. He agrees to take tid for one month and will call back if no relief

## 2019-02-27 NOTE — Telephone Encounter (Signed)
Patient's wife called with concerns about the patient's face hurting badly. She said he takes generic Lyrica, pregabalin 75 MG, and it was helping but now his pain is getting worse in his face again. She said sometimes it hurts him so badly it almost brings him to tears. She'd like to know if maybe he needs in increase in the medication  [facility] in [place] for this medication

## 2019-02-27 NOTE — Telephone Encounter (Signed)
Please advise on message below.

## 2019-03-06 ENCOUNTER — Other Ambulatory Visit: Payer: Self-pay

## 2019-03-06 DIAGNOSIS — Z20822 Contact with and (suspected) exposure to covid-19: Secondary | ICD-10-CM

## 2019-03-09 LAB — NOVEL CORONAVIRUS, NAA: SARS-CoV-2, NAA: NOT DETECTED

## 2019-03-09 NOTE — Telephone Encounter (Signed)
Sending to clinical staff for review: Okay to sign/close encounter or is further follow up needed? ° °

## 2019-04-02 ENCOUNTER — Telehealth: Payer: Self-pay | Admitting: Neurology

## 2019-04-02 NOTE — Telephone Encounter (Signed)
Move forward with verification for Botox.

## 2019-04-02 NOTE — Telephone Encounter (Signed)
Just let me know if/when this is approved.   It may not be

## 2019-04-02 NOTE — Telephone Encounter (Signed)
Can't increase further due to his hx of myoclonus with lyrica.  Can we see if insurance will approve botox for dx of "cranial nerve disorder" if patient willing?

## 2019-04-02 NOTE — Telephone Encounter (Signed)
Patient's wife called and requested a call back from a nurse. She said the increase in Lyrica to 75 MG did not help the patient with his facial pain. See telephone note from 02/27/19 for reference.  Pharmacy [facility]

## 2019-04-02 NOTE — Telephone Encounter (Signed)
See below, please advise.

## 2019-04-02 NOTE — Telephone Encounter (Signed)
No.  I don't think that chiropractics will help

## 2019-04-02 NOTE — Telephone Encounter (Signed)
Patient would like to know if you think the chiropractor using a "heat laser" would be effective? He is not wanting to do Botox but states he will think about it he is afraid of a "lip drop"

## 2019-04-24 HISTORY — PX: COLONOSCOPY: SHX174

## 2019-04-24 HISTORY — PX: POLYPECTOMY: SHX149

## 2019-04-27 ENCOUNTER — Encounter: Payer: Self-pay | Admitting: *Deleted

## 2019-04-27 NOTE — Progress Notes (Signed)
Submitted via Botox one waiting for response

## 2019-05-10 ENCOUNTER — Ambulatory Visit: Payer: Medicare Other | Attending: Internal Medicine

## 2019-05-10 DIAGNOSIS — Z23 Encounter for immunization: Secondary | ICD-10-CM | POA: Insufficient documentation

## 2019-05-10 NOTE — Progress Notes (Signed)
   Covid-19 Vaccination Clinic  Name:  John Reese    MRN: [id number] DOB: 1951-04-17  05/10/2019  [name] was observed post Covid-19 immunization for 15 mins without incidence. He was provided with Vaccine Information Sheet and instruction to access the V-Safe system.   [name] was instructed to call 911 with any severe reactions post vaccine: Marland Kitchen Difficulty breathing  . Swelling of your face and throat  . A fast heartbeat  . A bad rash all over your body  . Dizziness and weakness

## 2019-05-14 ENCOUNTER — Telehealth: Payer: Self-pay | Admitting: Neurology

## 2019-05-14 NOTE — Telephone Encounter (Signed)
Wife is calling in about nerve pain in his face-the medication has just stopped working. She was wanting to know about BOTOX for patient. She said he is still having the pain. She is wondering if anything has been done as far as botox. Thanks!

## 2019-05-15 NOTE — Telephone Encounter (Signed)
I don't think that this diagnosis will be approved (last one I tried got denied).  Looks like susan was trying to work on it and you can ask her but pretty sure that it will be denied.  I talked with Dr. Letta Pate and he doesn't do blocks for trigeminal neuralgia but he thought that Dr. Maryjean Ka at [facility] neurosurgery might.  If patient interested, can you find out if they do that and refer for that for dx of trigeminal neuralgia?

## 2019-05-15 NOTE — Telephone Encounter (Signed)
Would patient benefit from Botox? Need Office vist or VV to discuss, please advise, lots of nerve pains in face

## 2019-05-15 NOTE — Telephone Encounter (Signed)
Can you check on botox before I go any further with this, thanks

## 2019-05-18 NOTE — Telephone Encounter (Signed)
Can you check on this, I am unsure

## 2019-05-18 NOTE — Telephone Encounter (Signed)
Patient's wife called. Please Call. Thank you

## 2019-05-18 NOTE — Telephone Encounter (Signed)
I do not know anything about the botox or the process of their prior authorizations. Sending back for further evaluation to be done the the sender.

## 2019-05-18 NOTE — Telephone Encounter (Signed)
Hey come see me on this, thanks

## 2019-05-28 ENCOUNTER — Ambulatory Visit: Payer: PPO | Attending: Internal Medicine

## 2019-05-28 DIAGNOSIS — Z23 Encounter for immunization: Secondary | ICD-10-CM | POA: Insufficient documentation

## 2019-05-28 NOTE — Progress Notes (Signed)
   Covid-19 Vaccination Clinic  Name:  John Reese    MRN: [id number] DOB: April 30, 1950  05/28/2019  Mr. Cortese was observed post Covid-19 immunization for 15 minutes without incidence. He was provided with Vaccine Information Sheet and instruction to access the V-Safe system.   Mr. Barrentine was instructed to call 911 with any severe reactions post vaccine: Marland Kitchen Difficulty breathing  . Swelling of your face and throat  . A fast heartbeat  . A bad rash all over your body  . Dizziness and weakness    Immunizations Administered    Name Date Dose VIS Date Route   Pfizer COVID-19 Vaccine 05/28/2019  8:51 AM 0.3 mL 04/03/2019 Intramuscular   Manufacturer: [place]   Lot: [id number]   [place]: [id number]

## 2019-06-01 ENCOUNTER — Ambulatory Visit: Payer: PPO

## 2019-06-04 ENCOUNTER — Ambulatory Visit (INDEPENDENT_AMBULATORY_CARE_PROVIDER_SITE_OTHER): Payer: PPO | Admitting: "Endocrinology

## 2019-06-04 ENCOUNTER — Encounter: Payer: Self-pay | Admitting: "Endocrinology

## 2019-06-04 ENCOUNTER — Other Ambulatory Visit: Payer: Self-pay

## 2019-06-04 VITALS — BP 136/83 | HR 82 | Ht 73.0 in | Wt 159.9 lb

## 2019-06-04 DIAGNOSIS — I10 Essential (primary) hypertension: Secondary | ICD-10-CM

## 2019-06-04 DIAGNOSIS — E1165 Type 2 diabetes mellitus with hyperglycemia: Secondary | ICD-10-CM

## 2019-06-04 DIAGNOSIS — E782 Mixed hyperlipidemia: Secondary | ICD-10-CM | POA: Diagnosis not present

## 2019-06-04 LAB — POCT GLYCOSYLATED HEMOGLOBIN (HGB A1C): Hemoglobin A1C: 8.5 % — AB (ref 4.0–5.6)

## 2019-06-04 MED ORDER — ULTICARE MINI PEN NEEDLES 31G X 6 MM MISC: 1 refills | Status: DC

## 2019-06-04 MED ORDER — GLIPIZIDE ER 2.5 MG PO TB24: 2.5000 mg | ORAL_TABLET | Freq: Every day | ORAL | 1 refills | Status: DC

## 2019-06-04 NOTE — Progress Notes (Signed)
06/04/2019              Endocrinology follow-up note    Subjective:    Patient ID: John Reese, male    DOB: 10-20-1950, PCP [name], Fransisca Kaufmann, MD   Past Medical History:  Diagnosis Date  . Auditory disturbance    Decreased auditory acuity  . 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 Diabetes ([place])   . Dyslipidemia   . Tremor   . Trigeminal neuralgia    Right, V2 distribution   Past Surgical History:  Procedure Laterality Date  . FINGER AMPUTATION    . INNER EAR SURGERY Right    Ear drum repair  . PULSE GENERATOR IMPLANT Bilateral 03/30/2016   Procedure: BILATERAL PLACEMENT OF IMPLANTABLE PULSE GENERATOR;  Surgeon: Erline Levine, MD;  Location: [place];  Service: Neurosurgery;  Laterality: Bilateral;  BILATERAL PLACEMENT OF IMPLANTABLE PULSE GENERATOR  . SUBTHALAMIC STIMULATOR INSERTION Bilateral 03/22/2016   Procedure: BILATERAL DEEP BRAIN STIMULATOR PLACEMENT WITH STARFIX WITH DR. Carles Collet;  Surgeon: Erline Levine, MD;  Location: [place];  Service: Neurosurgery;  Laterality: Bilateral;   Social History   Socioeconomic History  . Marital status: Married    Spouse name: Not on file  . Number of children: 2  . Years of education: HS  . Highest education level: 12th grade  Occupational History  . Occupation: Works in Optometrist: [employer]    Comment: RETIRED  Tobacco Use  . Smoking status: Never Smoker  . Smokeless tobacco: Never Used  Substance and Sexual Activity  . Alcohol use: No  . Drug use: No  . Sexual activity: Not on file  Other Topics Concern  . Not on file  Social History Narrative   Patient is right handed.   Patient drinks 2 cups coffee daily.   Social Determinants of Health   Financial Resource Strain:   . Difficulty of Paying Living Expenses: Not on file  Food Insecurity:   . Worried About Charity fundraiser in the Last Year: Not on file  . Ran Out of Food in the Last Year: Not on file   Transportation Needs:   . Lack of Transportation (Medical): Not on file  . Lack of Transportation (Non-Medical): Not on file  Physical Activity:   . Days of Exercise per Week: Not on file  . Minutes of Exercise per Session: Not on file  Stress:   . Feeling of Stress : Not on file  Social Connections:   . Frequency of Communication with Friends and Family: Not on file  . Frequency of Social Gatherings with Friends and Family: Not on file  . Attends Religious Services: Not on file  . Active Member of Clubs or Organizations: Not on file  . Attends Archivist Meetings: Not on file  . Marital Status: Not on file   Outpatient Encounter Medications as of 06/04/2019  Medication Sig  . aspirin EC 81 MG tablet Take 81 mg by mouth daily.  . carbamazepine (TEGRETOL) 200 MG tablet Take 1 tablet (200 mg total) by mouth 2 (two) times daily.  Marland Kitchen CINNAMON PO Take 1,000 mg by mouth daily.  . fish oil-omega-3 fatty acids 1000 MG capsule Take 2 g by mouth daily.  Marland Kitchen glipiZIDE (GLUCOTROL XL) 2.5 MG 24 hr tablet Take 1 tablet (2.5 mg total) by mouth daily with breakfast.  . ibuprofen (ADVIL,MOTRIN) 200 MG tablet Take 800 mg by mouth as needed  for moderate pain.  . Insulin Glargine, 1 Unit Dial, 300 UNIT/ML SOPN Inject 12 Units into the skin at bedtime.  . Insulin Pen Needle (ULTICARE MINI PEN NEEDLES) 31G X 6 MM MISC USE AS DIRECTED AT BEDTIME  . Multiple Vitamin (MULTIVITAMIN) tablet Take 1 tablet by mouth daily.  . pravastatin (PRAVACHOL) 40 MG tablet Take 1 tablet (40 mg total) by mouth daily.  . pregabalin (LYRICA) 75 MG capsule Take 1 capsule by mouth twice daily  . [DISCONTINUED] ULTICARE MINI PEN NEEDLES 31G X 6 MM MISC USE AS DIRECTED AT BEDTIME   No facility-administered encounter medications on file as of 06/04/2019.   ALLERGIES: Allergies  Allergen Reactions  . No Known Allergies    VACCINATION STATUS: Immunization History  Administered Date(s) Administered  . Fluad Quad(high  Dose 65+) 12/30/2018  . Influenza, High Dose Seasonal PF 02/07/2015, 02/01/2016, 01/02/2017, 01/24/2018, 01/24/2018  . Influenza-Unspecified 02/02/2014  . PFIZER SARS-COV-2 Vaccination 05/10/2019, 05/28/2019  . Pneumococcal Conjugate-13 04/07/2015  . Pneumococcal Polysaccharide-23 07/02/2016, 12/30/2018  . Zoster 12/02/2012    Diabetes He presents for his follow-up diabetic visit. He has type 2 diabetes mellitus. Onset time: He was diagnosed at approximate age of 22 years. His disease course has been worsening (He is diagnosed with essential tremors since last visit, following with neurology.). There are no hypoglycemic associated symptoms. Pertinent negatives for hypoglycemia include no confusion, headaches, pallor or seizures. There are no diabetic associated symptoms. Pertinent negatives for diabetes include no chest pain, no fatigue, no polydipsia, no polyphagia, no polyuria and no weakness. There are no hypoglycemic complications. Symptoms are worsening. There are no diabetic complications. Risk factors for coronary artery disease include diabetes mellitus, dyslipidemia, hypertension, male sex and sedentary lifestyle. Current diabetic treatment includes insulin injections and oral agent (monotherapy). He is compliant with treatment most of the time. His weight is increasing steadily. He is following a generally healthy diet. When asked about meal planning, he reported none. He has had a previous visit with a dietitian. He participates in exercise intermittently. His home blood glucose trend is increasing steadily. His breakfast blood glucose range is generally 110-130 mg/dl. His bedtime blood glucose range is generally 180-200 mg/dl. His overall blood glucose range is 140-180 mg/dl. An ACE inhibitor/angiotensin II receptor blocker is being taken.  Hyperlipidemia This is a chronic problem. The current episode started more than 1 year ago. The problem is controlled. Exacerbating diseases include  diabetes. Pertinent negatives include no chest pain, myalgias or shortness of breath. Current antihyperlipidemic treatment includes statins. Risk factors for coronary artery disease include diabetes mellitus, dyslipidemia, hypertension, male sex and a sedentary lifestyle.  Hypertension This is a chronic problem. The current episode started more than 1 year ago. The problem is uncontrolled. Pertinent negatives include no chest pain, headaches, neck pain, palpitations or shortness of breath. Risk factors for coronary artery disease include dyslipidemia and diabetes mellitus. Past treatments include ACE inhibitors.    Review of systems  Constitutional: + Minimally fluctuating body weight,  current  Body mass index is 21.1 kg/m. , no fatigue, no subjective hyperthermia, no subjective hypothermia Eyes: no blurry vision, no xerophthalmia ENT: no sore throat, no nodules palpated in throat, no dysphagia/odynophagia, no hoarseness Cardiovascular: no Chest Pain, no Shortness of Breath, no palpitations, no leg swelling Respiratory: no cough, no shortness of breath Gastrointestinal: no Nausea/Vomiting/Diarhhea Musculoskeletal: no muscle/joint aches Skin: no rashes, no hyperemia Neurological: no tremors, no numbness, no tingling, no dizziness Psychiatric: no depression, no anxiety   Objective:  BP 136/83   Pulse 82   Ht 6\' 1"  (1.854 m)   Wt 159 lb 14.4 oz (72.5 kg)   BMI 21.10 kg/m   Wt Readings from Last 3 Encounters:  06/04/19 159 lb 14.4 oz (72.5 kg)  02/05/19 154 lb 9.6 oz (70.1 kg)  11/04/18 149 lb 12.8 oz (67.9 kg)      Physical Exam- Limited  Constitutional:  Body mass index is 21.1 kg/m. , not in acute distress, normal state of mind Eyes:  EOMI, no exophthalmos Neck: Supple Thyroid: No gross goiter Respiratory: Adequate breathing efforts Musculoskeletal: no gross deformities, strength intact in all four extremities, no gross restriction of joint movements Skin:  no rashes, no  hyperemia Neurological: no tremor with outstretched hands,   Results for orders placed or performed in visit on 06/04/19  HgB A1c  Result Value Ref Range   Hemoglobin A1C 8.5 (A) 4.0 - 5.6 %   HbA1c POC (<> result, manual entry)     HbA1c, POC (prediabetic range)     HbA1c, POC (controlled diabetic range)     Diabetic Labs (most recent): Lab Results  Component Value Date   HGBA1C 8.5 (A) 06/04/2019   HGBA1C 7.8 (H) 01/27/2019   HGBA1C 7.5 (H) 09/23/2018   Lipid Panel     Component Value Date/Time   CHOL 162 09/23/2018 0749   TRIG 66 09/23/2018 0749   HDL 47 09/23/2018 0749   CHOLHDL 3.4 09/23/2018 0749   VLDL 20 06/27/2015 0816   LDLCALC 100 (H) 09/23/2018 0749    Assessment & Plan:   1. Uncontrolled type 2 diabetes mellitus without complication, with long-term current use of insulin (HCC) -No gross complications from his diabetes so far, however patient remains at a high risk for more acute and chronic complications of diabetes which include CAD, CVA, CKD, retinopathy, and neuropathy. These are all discussed in detail with the patient.  -He presents with tightly controlled fasting glycemic profile, however significantly above target bedtime readings.  He is a point-of-care A1c is 8.5%, increasing from 7.8%.    - I have re-counseled the patient on diet management  by adopting a carbohydrate restricted / protein rich  Diet.  - he  admits there is a room for improvement in his diet and drink choices. -  Suggestion is made for him to avoid simple carbohydrates  from his diet including Cakes, Sweet Desserts / Pastries, Ice Cream, Soda (diet and regular), Sweet Tea, Candies, Chips, Cookies, Sweet Pastries,  Store Bought Juices, Alcohol in Excess of  1-2 drinks a day, Artificial Sweeteners, Coffee Creamer, and "Sugar-free" Products. This will help patient to have stable blood glucose profile and potentially avoid unintended weight gain.   - Patient is advised to stick to a routine  mealtimes to eat 3 meals  a day and avoid unnecessary snacks ( to snack only to correct hypoglycemia).  - I have approached patient with the following individualized plan to manage diabetes and patient agrees.  -It was previously determined that his c-peptide is critically low at 0.5, indicating pancreatic exhaustion.  He may very soon require intensive treatment with basal/bolus insulin in order for him to maintain control of diabetes to target.     -This patient has a propensity to fall, recently suspected of having vasovagal syncope.  Priority #1 would be to avoid hypoglycemia. -He will not tolerate any higher dose of basal insulin.  He is advised to continue Toujeo 12 units nightly,  continue to monitor blood glucose  twice a day-daily before breakfast and at bedtime.   -He will benefit from low-dose glipizide to control postprandial glycemic profile.  I discussed and added glipizide 2.5 mg XL p.o. daily at breakfast point.  -He is encouraged to call clinic if he is blood glucose readings are greater than 200 mg/dL x 3. - His GAD antibodies are negative, however does not rule out a possibility of LADA.   He is not a candidate for incretin therapy, SGLT2 inhibitor therapy. - Patient specific target  for A1c; LDL, HDL, Triglycerides,  were discussed in detail.  2) BP/HTN:  His blood pressure is controlled to target.  He is not currently on antihypertensive medication.  3) Lipids/HPL: His recent lipid panel showed uncontrolled LDL at 113.  He is advised to continue pravastatin 40 mg p.o. nightly.  Side effects and precautions discussed with him.     4)  Weight/Diet: He has gained 8 pounds since last visit, a good development for him.  He is not a candidate for weight loss.   exercise, and carbohydrates information provided.  5) Chronic Care/Health Maintenance:  -Patient is on ACEI/ARB and Statin medications and encouraged to continue to follow up with Ophthalmology, Podiatrist at least  yearly or according to recommendations, and advised to  stay away from smoking. I have recommended yearly flu vaccine and pneumonia vaccination at least every 5 years; moderate intensity exercise for up to 150 minutes weekly; and  sleep for at least 7 hours a day. - He was recently diagnosed with essential tremors. I advised him to continue follow-up with neurology.   - I advised patient to maintain close follow up with [name], Fransisca Kaufmann, MD for primary care needs.  - Time spent on this patient care encounter:  35 min, of which > 50% was spent in  counseling and the rest reviewing his blood glucose logs , discussing his hypoglycemia and hyperglycemia episodes, reviewing his current and  previous labs / studies  ( including abstraction from other facilities) and medications  doses and developing a  long term treatment plan and documenting his care.   Please refer to Patient Instructions for Blood Glucose Monitoring and Insulin/Medications Dosing Guide"  in media tab for additional information. Please  also refer to " Patient Self Inventory" in the Media  tab for reviewed elements of pertinent patient history.  [employer] participated in the discussions, expressed understanding, and voiced agreement with the above plans.  All questions were answered to his satisfaction. he is encouraged to contact clinic should he have any questions or concerns prior to his return visit.    Follow up plan: -Return in about 10 days (around 06/14/2019), or phone, for Follow up with Meter and Logs Only - no Labs.  Glade Jmarion, MD Phone: [phone]  Fax: [phone]  -  This note was partially dictated with voice recognition software. Similar sounding words can be transcribed inadequately or may not  be corrected upon review.  06/04/2019, 5:03 PM

## 2019-06-05 ENCOUNTER — Ambulatory Visit: Payer: PPO | Admitting: "Endocrinology

## 2019-06-11 ENCOUNTER — Other Ambulatory Visit: Payer: Self-pay

## 2019-06-11 ENCOUNTER — Encounter: Payer: Self-pay | Admitting: "Endocrinology

## 2019-06-11 ENCOUNTER — Ambulatory Visit (INDEPENDENT_AMBULATORY_CARE_PROVIDER_SITE_OTHER): Payer: PPO | Admitting: "Endocrinology

## 2019-06-11 DIAGNOSIS — E782 Mixed hyperlipidemia: Secondary | ICD-10-CM | POA: Diagnosis not present

## 2019-06-11 DIAGNOSIS — E1165 Type 2 diabetes mellitus with hyperglycemia: Secondary | ICD-10-CM

## 2019-06-11 DIAGNOSIS — I10 Essential (primary) hypertension: Secondary | ICD-10-CM

## 2019-06-11 NOTE — Progress Notes (Signed)
06/11/2019                                                                 Endocrinology Telehealth Visit Follow up Note -During COVID -19 Pandemic  This visit type was conducted due to national recommendations for restrictions regarding the COVID-19 Pandemic  in an effort to limit this patient's exposure and mitigate transmission of the corona virus.  Due to his co-morbid illnesses, John Reese is at  moderate to high risk for complications without adequate follow up.  This format is felt to be most appropriate for him at this time.  I connected with this patient on 06/11/2019   by telephone and verified that I am speaking with the correct person using two identifiers. John Reese, 08/25/1950. he has verbally consented to this visit. All issues noted in this document were discussed and addressed. The format was not optimal for physical exam.     Subjective:    Patient ID: John Reese, male    DOB: 14-Oct-1950, PCP [name], John Kaufmann, MD   Past Medical History:  Diagnosis Date  . Auditory disturbance    Decreased auditory acuity  . 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 Diabetes ([place])   . Dyslipidemia   . Tremor   . Trigeminal neuralgia    Right, V2 distribution   Past Surgical History:  Procedure Laterality Date  . FINGER AMPUTATION    . INNER EAR SURGERY Right    Ear drum repair  . PULSE GENERATOR IMPLANT Bilateral 03/30/2016   Procedure: BILATERAL PLACEMENT OF IMPLANTABLE PULSE GENERATOR;  Surgeon: Erline Levine, MD;  Location: [facility];  Service: Neurosurgery;  Laterality: Bilateral;  BILATERAL PLACEMENT OF IMPLANTABLE PULSE GENERATOR  . SUBTHALAMIC STIMULATOR INSERTION Bilateral 03/22/2016   Procedure: BILATERAL DEEP BRAIN STIMULATOR PLACEMENT WITH STARFIX WITH DR. Carles Collet;  Surgeon: Erline Levine, MD;  Location: [facility];  Service: Neurosurgery;  Laterality: Bilateral;   Social History   Socioeconomic History  . Marital status:  Married    Spouse name: Not on file  . Number of children: 2  . Years of education: HS  . Highest education level: 12th grade  Occupational History  . Occupation: Works in Optometrist: [employer]    Comment: RETIRED  Tobacco Use  . Smoking status: Never Smoker  . Smokeless tobacco: Never Used  Substance and Sexual Activity  . Alcohol use: No  . Drug use: No  . Sexual activity: Not on file  Other Topics Concern  . Not on file  Social History Narrative   Patient is right handed.   Patient drinks 2 cups coffee daily.   Social Determinants of Health   Financial Resource Strain:   . Difficulty of Paying Living Expenses: Not on file  Food Insecurity:   . Worried About Charity fundraiser in the Last Year: Not on file  . Ran Out of Food in the Last Year: Not on file  Transportation Needs:   . Lack of Transportation (Medical): Not on file  . Lack of Transportation (Non-Medical): Not on file  Physical Activity:   . Days of Exercise per Week: Not on file  . Minutes of Exercise per Session: Not on  file  Stress:   . Feeling of Stress : Not on file  Social Connections:   . Frequency of Communication with Friends and Family: Not on file  . Frequency of Social Gatherings with Friends and Family: Not on file  . Attends Religious Services: Not on file  . Active Member of Clubs or Organizations: Not on file  . Attends Archivist Meetings: Not on file  . Marital Status: Not on file   Outpatient Encounter Medications as of 06/11/2019  Medication Sig  . aspirin EC 81 MG tablet Take 81 mg by mouth daily.  . carbamazepine (TEGRETOL) 200 MG tablet Take 1 tablet (200 mg total) by mouth 2 (two) times daily.  Marland Kitchen CINNAMON PO Take 1,000 mg by mouth daily.  . fish oil-omega-3 fatty acids 1000 MG capsule Take 2 g by mouth daily.  Marland Kitchen glipiZIDE (GLUCOTROL XL) 2.5 MG 24 hr tablet Take 1 tablet (2.5 mg total) by mouth daily with breakfast.  . ibuprofen (ADVIL,MOTRIN) 200 MG tablet Take  800 mg by mouth as needed for moderate pain.  . Insulin Glargine, 1 Unit Dial, 300 UNIT/ML SOPN Inject 12 Units into the skin at bedtime.  . Insulin Pen Needle (ULTICARE MINI PEN NEEDLES) 31G X 6 MM MISC USE AS DIRECTED AT BEDTIME  . Multiple Vitamin (MULTIVITAMIN) tablet Take 1 tablet by mouth daily.  . pravastatin (PRAVACHOL) 40 MG tablet Take 1 tablet (40 mg total) by mouth daily.  . pregabalin (LYRICA) 75 MG capsule Take 1 capsule by mouth twice daily   No facility-administered encounter medications on file as of 06/11/2019.   ALLERGIES: Allergies  Allergen Reactions  . No Known Allergies    VACCINATION STATUS: Immunization History  Administered Date(s) Administered  . Fluad Quad(high Dose 65+) 12/30/2018  . Influenza, High Dose Seasonal PF 02/07/2015, 02/01/2016, 01/02/2017, 01/24/2018, 01/24/2018  . Influenza-Unspecified 02/02/2014  . PFIZER SARS-COV-2 Vaccination 05/10/2019, 05/28/2019  . Pneumococcal Conjugate-13 04/07/2015  . Pneumococcal Polysaccharide-23 07/02/2016, 12/30/2018  . Zoster 12/02/2012    Diabetes He presents for his follow-up diabetic visit. He has type 2 diabetes mellitus. Onset time: He was diagnosed at approximate age of 50 years. His disease course has been improving (He is diagnosed with essential tremors since last visit, following with neurology.). There are no hypoglycemic associated symptoms. Pertinent negatives for hypoglycemia include no confusion, headaches, pallor or seizures. There are no diabetic associated symptoms. Pertinent negatives for diabetes include no chest pain, no fatigue, no polydipsia, no polyphagia, no polyuria and no weakness. There are no hypoglycemic complications. Symptoms are improving. There are no diabetic complications. Risk factors for coronary artery disease include diabetes mellitus, dyslipidemia, hypertension, male sex and sedentary lifestyle. Current diabetic treatment includes insulin injections and oral agent (monotherapy).  He is compliant with treatment most of the time. His weight is increasing steadily. He is following a generally healthy diet. When asked about meal planning, he reported none. He has had a previous visit with a dietitian. He participates in exercise intermittently. His home blood glucose trend is increasing steadily. (He has responded significantly to the addition of low-dose glipizide.  He is fasting glycemic profile most recently are 111, 85, 112, 102, 97, 82 Bedtime readings most recently are 254, 193, 185, 186, 185.) An ACE inhibitor/angiotensin II receptor blocker is being taken.  Hyperlipidemia This is a chronic problem. The current episode started more than 1 year ago. The problem is controlled. Exacerbating diseases include diabetes. Pertinent negatives include no chest pain, myalgias or shortness of  breath. Current antihyperlipidemic treatment includes statins. Risk factors for coronary artery disease include diabetes mellitus, dyslipidemia, hypertension, male sex and a sedentary lifestyle.  Hypertension This is a chronic problem. The current episode started more than 1 year ago. The problem is uncontrolled. Pertinent negatives include no chest pain, headaches, neck pain, palpitations or shortness of breath. Risk factors for coronary artery disease include dyslipidemia and diabetes mellitus. Past treatments include ACE inhibitors.    Review of systems Limited as above.    Objective:    There were no vitals taken for this visit.  Wt Readings from Last 3 Encounters:  06/04/19 159 lb 14.4 oz (72.5 kg)  02/05/19 154 lb 9.6 oz (70.1 kg)  11/04/18 149 lb 12.8 oz (67.9 kg)      P  Results for orders placed or performed in visit on 06/04/19  HgB A1c  Result Value Ref Range   Hemoglobin A1C 8.5 (A) 4.0 - 5.6 %   HbA1c POC (<> result, manual entry)     HbA1c, POC (prediabetic range)     HbA1c, POC (controlled diabetic range)     Diabetic Labs (most recent): Lab Results  Component  Value Date   HGBA1C 8.5 (A) 06/04/2019   HGBA1C 7.8 (H) 01/27/2019   HGBA1C 7.5 (H) 09/23/2018   Lipid Panel     Component Value Date/Time   CHOL 162 09/23/2018 0749   TRIG 66 09/23/2018 0749   HDL 47 09/23/2018 0749   CHOLHDL 3.4 09/23/2018 0749   VLDL 20 06/27/2015 0816   LDLCALC 100 (H) 09/23/2018 0749    Assessment & Plan:   1. Uncontrolled type 2 diabetes mellitus without complication, with long-term current use of insulin (HCC) -No gross complications from his diabetes so far, however patient remains at a high risk for more acute and chronic complications of diabetes which include CAD, CVA, CKD, retinopathy, and neuropathy. These are all discussed in detail with the patient. He has responded significantly to the addition of low-dose glipizide.  He is fasting glycemic profile most recently are 111, 85, 112, 102, 97, 82 Bedtime readings most recently are 254, 193, 185, 186, 185.  His recent point-of-care A1c is 8.5%, increasing from 7.8%.    - I have re-counseled the patient on diet management  by adopting a carbohydrate restricted / protein rich  Diet.  - he  admits there is a room for improvement in his diet and drink choices. -  Suggestion is made for him to avoid simple carbohydrates  from his diet including Cakes, Sweet Desserts / Pastries, Ice Cream, Soda (diet and regular), Sweet Tea, Candies, Chips, Cookies, Sweet Pastries,  Store Bought Juices, Alcohol in Excess of  1-2 drinks a day, Artificial Sweeteners, Coffee Creamer, and "Sugar-free" Products. This will help patient to have stable blood glucose profile and potentially avoid unintended weight gain.   - Patient is advised to stick to a routine mealtimes to eat 3 meals  a day and avoid unnecessary snacks ( to snack only to correct hypoglycemia).  - I have approached patient with the following individualized plan to manage diabetes and patient agrees.  -It was previously determined that his c-peptide is critically low  at 0.5, indicating pancreatic exhaustion.  He may very soon require intensive treatment with basal/bolus insulin in order for him to maintain control of diabetes to target.     -This patient has a propensity to fall, recently suspected of having vasovagal syncope.  Priority #1 would be to avoid hypoglycemia. -He will  not tolerate any higher dose of basal insulin.  He is advised to continue Toujeo 12 units nightly, continue glipizide 2.5 mg XL p.o. daily at breakfast.  He is advised to continue monitoring blood glucose twice a day-daily before breakfast and at bedtime.    -He is encouraged to call clinic if he is blood glucose readings are greater than 200 mg/dL x 3. - His GAD antibodies are negative, however does not rule out a possibility of LADA.   He is not a candidate for incretin therapy, SGLT2 inhibitor therapy. - Patient specific target  for A1c; LDL, HDL, Triglycerides,  were discussed in detail.  2) BP/HTN:  he is advised to home monitor blood pressure and report if > 140/90 on 2 separate readings.   He is not currently on antihypertensive medication.  3) Lipids/HPL: His recent lipid panel showed uncontrolled LDL at 113.  He is advised to continue pravastatin 40 mg p.o. nightly.   Side effects and precautions discussed with him.     4)  Weight/Diet: He has gained 8 pounds since last visit, a good development for him.  He is not a candidate for weight loss.   exercise, and carbohydrates information provided.  5) Chronic Care/Health Maintenance:  -Patient is on ACEI/ARB and Statin medications and encouraged to continue to follow up with Ophthalmology, Podiatrist at least yearly or according to recommendations, and advised to  stay away from smoking. I have recommended yearly flu vaccine and pneumonia vaccination at least every 5 years; moderate intensity exercise for up to 150 minutes weekly; and  sleep for at least 7 hours a day. - He was recently diagnosed with essential tremors. I  advised him to continue follow-up with neurology.   - I advised patient to maintain close follow up with [name], John Kaufmann, MD for primary care needs.  - Time spent on this patient care encounter:  35 min, of which >50% was spent in  counseling and the rest reviewing his  current and  previous labs/studies ( including abstraction from other facilities),  previous treatments, his blood glucose readings, and medications' doses and developing a plan for long-term care based on the latest recommendations for standards of care; and documenting his care.  John Reese participated in the discussions, expressed understanding, and voiced agreement with the above plans.  All questions were answered to his satisfaction. he is encouraged to contact clinic should he have any questions or concerns prior to his return visit. Follow up plan: -Return in about 4 months (around 10/09/2019) for Bring Meter and Logs- A1c in Office, Follow up with Pre-visit Labs.  Glade Gilverto, MD Phone: [phone]  Fax: [phone]  -  This note was partially dictated with voice recognition software. Similar sounding words can be transcribed inadequately or may not  be corrected upon review.  06/11/2019, 11:49 AM

## 2019-06-15 ENCOUNTER — Ambulatory Visit: Payer: PPO | Admitting: "Endocrinology

## 2019-06-29 ENCOUNTER — Telehealth: Payer: Self-pay

## 2019-06-29 ENCOUNTER — Other Ambulatory Visit: Payer: Self-pay | Admitting: Neurology

## 2019-06-29 ENCOUNTER — Other Ambulatory Visit: Payer: Self-pay | Admitting: "Endocrinology

## 2019-06-29 ENCOUNTER — Other Ambulatory Visit: Payer: Self-pay

## 2019-06-29 MED ORDER — CARBAMAZEPINE 200 MG PO TABS: 200.0000 mg | ORAL_TABLET | Freq: Two times a day (BID) | ORAL | 0 refills | Status: DC

## 2019-06-29 MED ORDER — PREGABALIN 75 MG PO CAPS: 75.0000 mg | ORAL_CAPSULE | Freq: Two times a day (BID) | ORAL | 0 refills | Status: DC

## 2019-06-29 NOTE — Telephone Encounter (Signed)
Medication refilled.  Advised wife appointment would be needed to discuss medication increase.

## 2019-06-29 NOTE — Telephone Encounter (Signed)
Spoke with Vaughan Basta informed her insurance does not approve Botox for treatment of trigeminal neuralgia. I talked to a Botox insurance specialist Lolita Cram and she said the same. I relayed the same message Dr. Carles Collet had offered to go to [facility] and talk to Dr. Maryjean Ka. Vaughan Basta said [facility] absolutely does not want to change doctors.  She asked if she could come in with him at his next visit because he does not always relay the information back to her. I asked if he had a diagnosis of dementia and she said no. I told her as of today we are still under a no visitor rule unless the person is not capable of understanding or has dementia. I told her when it is closer to the date it is possible the visitor rules have changed by then, that those rules are reviewed and updated regularly to keep up with current status of COVID.

## 2019-06-29 NOTE — Telephone Encounter (Signed)
Patient's wife called in needing to get a refill on his Pregabalin 75 MG. He would like to increase it from 75 to 100 mg? He uses [facility] in [place]. He is having bad nerve pain in his face and can get really bad sometimes. Please Call. Thank you

## 2019-06-29 NOTE — Telephone Encounter (Signed)
Patients wife called to get status of Botox authorization.

## 2019-06-30 NOTE — Telephone Encounter (Signed)
Rx(s) sent to pharmacy electronically.  

## 2019-07-14 ENCOUNTER — Telehealth: Payer: Self-pay | Admitting: Family Medicine

## 2019-07-14 NOTE — Chronic Care Management (AMB) (Signed)
  Chronic Care Management   Note  07/14/2019 Name: John Reese MRN: [id number] DOB: 04/24/1950  John Reese is a 69 y.o. year old male who is a primary care patient of [name], Fransisca Kaufmann, MD. I reached out to [employer] by phone today in response to a referral sent by [name]'s health plan.     [name]'s wife John Reese was given information about Chronic Care Management services today including:  1. CCM service includes personalized support from designated clinical staff supervised by his physician, including individualized plan of care and coordination with other care providers 2. 24/7 contact phone numbers for assistance for urgent and routine care needs. 3. Service will only be billed when office clinical staff spend 20 minutes or more in a month to coordinate care. 4. Only one practitioner may furnish and bill the service in a calendar month. 5. The patient may stop CCM services at any time (effective at the end of the month) by phone call to the office staff. 6. The patient will be responsible for cost sharing (co-pay) of up to 20% of the service fee (after annual deductible is met).  Patient's wife John Reese did not agree to enrollment in care management services and does not wish to consider at this time.  Follow up plan: The patient has been provided with contact information for the care management team and has been advised to call with any health related questions or concerns.   Noreene Larsson, [address], [place], [place] [address] Direct Dial: [phone] [email or web address] Website: [email or web address]

## 2019-07-30 NOTE — Progress Notes (Signed)
Assessment/Plan:    1.  Essential Tremor  -IPG changed on 03/22/16.  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  I did increase the stimulator just a tad today to see if that would help.  2.  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  Patient is status post bilateral VIM DBS.  Tremor is doing fairly well but he has noted some tremor and asks if we can turn it up some.  He does tell me that they tried to check his battery and they accidentally turned off the battery and the tremor was terrible.  My previous records were reviewed prior to todays visit.  He has called me since our last visit with difficulty with his trigeminal neuralgia.  He definitely does worse in the wintertime.  He wanted to pursue Botox, but that was not an option with his insurance.  Offered a referral to Dr. Maryjean Ka, who does do trigeminal nerve blocks.  This was declined, mostly because he thought that he misunderstood and thought that I would not see him any longer.  Regardless, today the patient states that the pain has been increased and he would like the referral now. pt denies falls but seems that his walking is unstable at night when he gets up.  Pt denies lightheadedness, near syncope.  No hallucinations.  Mood has been good.  Current prescribed movement disorder medications: Carbamazepine, 200 mg twice per day Lyrica, 75 mg twice per day (higher dosages with myoclonus) -patient recently tried to increase the dose and it did not help the pain, so he went back down.    ALLERGIES:   Allergies  Allergen Reactions  . No Known Allergies     CURRENT MEDICATIONS:  Outpatient Encounter Medications as of 08/03/2019  Medication Sig    . aspirin EC 81 MG tablet Take 81 mg by mouth daily.  . carbamazepine (TEGRETOL) 200 MG tablet Take 1 tablet (200 mg total) by mouth 2 (two) times daily.  Marland Kitchen CINNAMON PO Take 1,000 mg by mouth daily.  . fish oil-omega-3 fatty acids 1000 MG capsule Take 2 g by mouth daily.  Marland Kitchen glipiZIDE (GLUCOTROL XL) 2.5 MG 24 hr tablet Take 1 tablet (2.5 mg total) by mouth daily with breakfast.  . ibuprofen (ADVIL,MOTRIN) 200 MG tablet Take 800 mg by mouth as needed for moderate pain.  . Insulin Pen Needle (ULTICARE MINI PEN NEEDLES) 31G X 6 MM MISC USE AS DIRECTED AT BEDTIME  . Multiple Vitamin (MULTIVITAMIN) tablet Take 1 tablet by mouth daily.  . pravastatin (PRAVACHOL) 40 MG tablet Take 1 tablet (40 mg total) by mouth daily.  . pregabalin (LYRICA) 75 MG capsule Take 1 capsule (75 mg total) by mouth 2 (two) times daily.  [name] 300 UNIT/ML Solostar Pen INJECT 12 UNITS INTO THE SKIN AT BEDTIME.   No facility-administered encounter medications on file as of 08/03/2019.     Objective:    PHYSICAL EXAMINATION:    VITALS:   Vitals:   08/03/19 0823  BP: (!) 143/79  Pulse: 79  SpO2: 99%  Weight: 155 lb 6.4 oz (70.5 kg)  Height: 6\' 1"  (1.854 m)    GEN:  The patient appears stated age  and is in NAD. HEENT:  Normocephalic, atraumatic.  The mucous membranes are moist. The superficial temporal arteries are without ropiness or tenderness. CV:  RRR Lungs:  CTAB Neck/HEME:  There are no carotid bruits bilaterally.  Neurological examination:  Orientation: The patient is alert and oriented x3. Cranial nerves: There is good facial symmetry. The speech is fluent and clear. Soft palate rises symmetrically and there is no tongue deviation. Hearing is intact to conversational tone. Sensation: Sensation is intact to light touch throughout Motor: Strength is at least antigravity x4.  Movement examination: Tone: There is normal tone in the UE/LE Abnormal movements: There is very little postural  tremor.  There is occasional myoclonus, although it is pretty mild. Coordination:  There is no decremation with RAM's,  Gait and Station: The patient has no difficulty arising out of a deep-seated chair without the use of the hands. The patient's stride length is good  I have reviewed and interpreted the following labs independently   Chemistry      Component Value Date/Time   NA 139 01/27/2019 0812   NA 138 10/26/2015 1026   K 4.4 01/27/2019 0812   CL 103 01/27/2019 0812   CO2 31 01/27/2019 0812   BUN 19 01/27/2019 0812   BUN 18 10/26/2015 1026   CREATININE 1.04 01/27/2019 0812      Component Value Date/Time   CALCIUM 9.4 01/27/2019 0812   ALKPHOS 65 09/06/2017 1125   AST 14 01/27/2019 0812   ALT 16 01/27/2019 0812   BILITOT 0.4 01/27/2019 0812   BILITOT <0.2 10/26/2015 1026     Lab Results  Component Value Date   TSH 2.09 02/05/2018     Total time spent on today's visit was 30 minutes, including both face-to-face time and nonface-to-face time.  Time included that spent on review of records (prior notes available to me/labs/imaging if pertinent), discussing treatment and goals, answering patient's questions and coordinating care.  This time was independent of DBS time.  Cc:  [name], Fransisca Kaufmann, MD

## 2019-08-03 ENCOUNTER — Ambulatory Visit: Payer: PPO | Admitting: Neurology

## 2019-08-03 ENCOUNTER — Encounter: Payer: Self-pay | Admitting: Neurology

## 2019-08-03 ENCOUNTER — Other Ambulatory Visit: Payer: Self-pay

## 2019-08-03 VITALS — BP 143/79 | HR 79 | Ht 73.0 in | Wt 155.4 lb

## 2019-08-03 DIAGNOSIS — G250 Essential tremor: Secondary | ICD-10-CM

## 2019-08-03 DIAGNOSIS — Z9689 Presence of other specified functional implants: Secondary | ICD-10-CM | POA: Diagnosis not present

## 2019-08-03 DIAGNOSIS — G500 Trigeminal neuralgia: Secondary | ICD-10-CM

## 2019-08-03 NOTE — Patient Instructions (Signed)
I will send a referral to Dr. Maryjean Ka office for consideration of trigeminal nerve block.

## 2019-08-03 NOTE — Procedures (Signed)
DBS Programming was performed.    Manufacturer of DBS device: Medtronic  Total time spent programming was 10 minutes.  Device was confirmed to be on.  Soft start was confirmed to be on.  Impedences were checked and were within normal limits.  Battery was checked and was determined to be functioning normally and not near the end of life.  Final settings were as follows:   Active Contacts Amplitude (V) PW (ms) Frequency (hz) Battery  Left Brain       08/03/19 1-2+ 2.8 90 150 2.91                       Right Brain       08/03/19 1-2+ 2.7 90 150 2.90

## 2019-08-05 ENCOUNTER — Telehealth: Payer: Self-pay

## 2019-08-05 NOTE — Telephone Encounter (Signed)
Pt is scheduled for on appointment on 5/26 at 9:45 with [facility]

## 2019-08-06 ENCOUNTER — Other Ambulatory Visit: Payer: Self-pay

## 2019-08-06 ENCOUNTER — Ambulatory Visit (INDEPENDENT_AMBULATORY_CARE_PROVIDER_SITE_OTHER): Payer: PPO | Admitting: Family Medicine

## 2019-08-06 ENCOUNTER — Encounter: Payer: Self-pay | Admitting: Family Medicine

## 2019-08-06 VITALS — BP 126/64 | HR 83 | Temp 98.0°F | Ht 73.0 in | Wt 155.0 lb

## 2019-08-06 DIAGNOSIS — Z1211 Encounter for screening for malignant neoplasm of colon: Secondary | ICD-10-CM

## 2019-08-06 DIAGNOSIS — E1169 Type 2 diabetes mellitus with other specified complication: Secondary | ICD-10-CM

## 2019-08-06 DIAGNOSIS — E1165 Type 2 diabetes mellitus with hyperglycemia: Secondary | ICD-10-CM

## 2019-08-06 DIAGNOSIS — Z794 Long term (current) use of insulin: Secondary | ICD-10-CM

## 2019-08-06 DIAGNOSIS — I10 Essential (primary) hypertension: Secondary | ICD-10-CM | POA: Diagnosis not present

## 2019-08-06 DIAGNOSIS — E782 Mixed hyperlipidemia: Secondary | ICD-10-CM | POA: Diagnosis not present

## 2019-08-06 DIAGNOSIS — Z125 Encounter for screening for malignant neoplasm of prostate: Secondary | ICD-10-CM

## 2019-08-06 NOTE — Progress Notes (Signed)
BP 126/64   Pulse 83   Temp 98 F (36.7 C) (Temporal)   Ht 6\' 1"  (1.854 m)   Wt 70.3 kg   BMI 20.45 kg/m    Subjective:   Patient ID: John Reese, male    DOB: 1950/08/02, 69 y.o.   MRN: [id number]  HPI: John Reese is a 69 y.o. male presenting on 08/06/2019 for Medical Management of Chronic Issues  1. T2DM:  Managed by Dr. Dorris Fetch (Endocrinology). Up to date on yearly diabetic eye exam. Denies paresthesias in fingers/feet. Patient confirms that his foot exams are handled by Dr. Dorris Fetch. He is taking Insulin and Glipizide for the DM.    2. Essential HTN:  Patient confirms that he checks his BP at home and usually gets values in the 120s/high 70s-80s. He has been off of his BP medication for about a year (Dr. Dorris Fetch took him off this medication).  Blood pressure today is 126/64  3. Mixed Hyperlipidemia:  Patient reports that his cholesterol labs will be done prior to his visit with Dr. Dorris Fetch. Currently taking Pravastatin 40 mg QD and not having any side effects from the medication.  He denies any myalgias  4. Covid-19 Vaccination:  Patient reports that he received both doses of his Covid-19 vaccine [employer]) and had no side effects or problems with either dose of the vaccine.  Relevant past medical, surgical, family and social history reviewed and updated as indicated. Interim medical history since our last visit reviewed. Allergies and medications reviewed and updated.  Review of Systems  Constitutional: Negative for chills and fever.  Eyes: Negative for visual disturbance.  Respiratory: Negative for shortness of breath and wheezing.   Cardiovascular: Negative for chest pain and leg swelling.  Musculoskeletal: Negative for back pain and gait problem.  Skin: Negative for rash.  Neurological: Positive for dizziness and light-headedness.  All other systems reviewed and are negative.   Per HPI unless specifically indicated above   Allergies as of 08/06/2019      Reactions     No Known Allergies       Medication List       Accurate as of August 06, 2019  9:15 AM. If you have any questions, ask your nurse or doctor.        aspirin EC 81 MG tablet Take 81 mg by mouth daily.   carbamazepine 200 MG tablet Commonly known as: TEGRETOL Take 1 tablet (200 mg total) by mouth 2 (two) times daily.   CINNAMON PO Take 1,000 mg by mouth daily.   fish oil-omega-3 fatty acids 1000 MG capsule Take 2 g by mouth daily.   glipiZIDE 2.5 MG 24 hr tablet Commonly known as: GLUCOTROL XL Take 1 tablet (2.5 mg total) by mouth daily with breakfast.   ibuprofen 200 MG tablet Commonly known as: ADVIL Take 800 mg by mouth as needed for moderate pain.   multivitamin tablet Take 1 tablet by mouth daily.   pravastatin 40 MG tablet Commonly known as: PRAVACHOL Take 1 tablet (40 mg total) by mouth daily.   pregabalin 75 MG capsule Commonly known as: LYRICA Take 1 capsule (75 mg total) by mouth 2 (two) times daily.   Toujeo SoloStar 300 UNIT/ML Solostar Pen Generic drug: insulin glargine (1 Unit Dial) INJECT 12 UNITS INTO THE SKIN AT BEDTIME.   UltiCare Mini Pen Needles 31G X 6 MM Misc Generic drug: Insulin Pen Needle USE AS DIRECTED AT BEDTIME        Objective:  BP 126/64   Pulse 83   Temp 98 F (36.7 C) (Temporal)   Ht 6\' 1"  (1.854 m)   Wt 70.3 kg   BMI 20.45 kg/m   Wt Readings from Last 3 Encounters:  08/06/19 70.3 kg  08/03/19 70.5 kg  06/04/19 72.5 kg    Physical Exam Vitals and nursing note reviewed.  Constitutional:      General: He is not in acute distress.    Appearance: He is well-developed. He is not diaphoretic.  Eyes:     General: No scleral icterus.    Conjunctiva/sclera: Conjunctivae normal.  Neck:     Thyroid: No thyromegaly.  Cardiovascular:     Rate and Rhythm: Normal rate and regular rhythm.     Heart sounds: Normal heart sounds. No murmur.  Pulmonary:     Effort: Pulmonary effort is normal. No respiratory distress.      Breath sounds: Normal breath sounds. No wheezing.  Skin:    General: Skin is warm and dry.     Findings: No rash.  Neurological:     Mental Status: He is alert and oriented to person, place, and time.     Coordination: Coordination normal.  Psychiatric:        Behavior: Behavior normal.     General: Patient is WDWN, appears stated age, and in no acute distress. Neck:: Thyroid palpated with no nodules or thyromegaly noted. Cardio: RRR, S1/S2 auscultated. Radial and DP pulses palpated. Pulm: CTAB. Neurological: Patient is alert and oriented to person, place, and time. Normal coordination. Psychiatric: Behavior normal with appropriate mood and affect.     Assessment & Plan:   Problem List Items Addressed This Visit      Cardiovascular and Mediastinum   Essential hypertension     Endocrine   Type 2 diabetes mellitus with other specified complication ([place]) - Primary   Uncontrolled type 2 diabetes mellitus with hyperglycemia ([place])     Other   Mixed hyperlipidemia    Other Visit Diagnoses    Colon cancer screening       Relevant Orders   Ambulatory referral to Gastroenterology   Prostate cancer screening       Relevant Orders   PSA, total and free      1. T2DM:  Continue current DM medications (Insulin and Glipizide) and follow up with bloodwork and Dr. Dorris Fetch at Endocrinology as scheduled.  2. Essential HTN:  Well-controlled without any medication. Continue checking BP values at home regularly.  3. Mixed Hyperlipidemia:  Continue taking Pravastatin and have Lipid Panel checked (will be ordered by Dr. Dorris Fetch).  Follow up plan:  Follow up in 6 months for a Cholesterol, HTN, and DM recheck or earlier if symptoms worsen or fail to improve.  Gaynelle Arabian, PA-S2 [facility] 08/06/2019, 9:15 AM  Patient seen and examined with Gaynelle Arabian, PA student, agree with assessment and plan above.  We will continue with current  medication. Caryl Pina, MD [facility] 08/12/2019, 10:14 PM

## 2019-08-07 ENCOUNTER — Encounter: Payer: Self-pay | Admitting: Gastroenterology

## 2019-08-27 ENCOUNTER — Encounter: Payer: PPO | Admitting: Gastroenterology

## 2019-08-27 ENCOUNTER — Ambulatory Visit (AMBULATORY_SURGERY_CENTER): Payer: Self-pay

## 2019-08-27 ENCOUNTER — Other Ambulatory Visit: Payer: Self-pay

## 2019-08-27 VITALS — Temp 97.4°F | Ht 69.0 in | Wt 155.0 lb

## 2019-08-27 DIAGNOSIS — Z1211 Encounter for screening for malignant neoplasm of colon: Secondary | ICD-10-CM

## 2019-08-27 NOTE — Progress Notes (Signed)
No allergies to soy or egg Pt is not on blood thinners or diet pills Denies issues with sedation/intubation Denies atrial flutter/fib Denies constipation   Emmi instructions given to pt  Pt is aware of Covid safety and care partner requirements.  Care partner 32.8

## 2019-09-03 ENCOUNTER — Encounter: Payer: Self-pay | Admitting: Gastroenterology

## 2019-09-10 ENCOUNTER — Telehealth: Payer: Self-pay | Admitting: Gastroenterology

## 2019-09-10 ENCOUNTER — Encounter: Payer: Self-pay | Admitting: Gastroenterology

## 2019-09-10 ENCOUNTER — Ambulatory Visit (AMBULATORY_SURGERY_CENTER): Payer: PPO | Admitting: Gastroenterology

## 2019-09-10 ENCOUNTER — Other Ambulatory Visit: Payer: Self-pay

## 2019-09-10 VITALS — BP 139/82 | HR 71 | Temp 96.9°F | Resp 10 | Ht 73.0 in | Wt 155.0 lb

## 2019-09-10 DIAGNOSIS — Z1211 Encounter for screening for malignant neoplasm of colon: Secondary | ICD-10-CM | POA: Diagnosis not present

## 2019-09-10 DIAGNOSIS — D125 Benign neoplasm of sigmoid colon: Secondary | ICD-10-CM | POA: Diagnosis not present

## 2019-09-10 DIAGNOSIS — D123 Benign neoplasm of transverse colon: Secondary | ICD-10-CM

## 2019-09-10 DIAGNOSIS — D120 Benign neoplasm of cecum: Secondary | ICD-10-CM | POA: Diagnosis not present

## 2019-09-10 DIAGNOSIS — I10 Essential (primary) hypertension: Secondary | ICD-10-CM | POA: Diagnosis not present

## 2019-09-10 DIAGNOSIS — E119 Type 2 diabetes mellitus without complications: Secondary | ICD-10-CM | POA: Diagnosis not present

## 2019-09-10 MED ORDER — SODIUM CHLORIDE 0.9 % IV SOLN: 500.0000 mL | INTRAVENOUS | Status: DC

## 2019-09-10 NOTE — Patient Instructions (Signed)
HANDOUTS PROVIDED ON: POLYPS, DIVERTICULOSIS, & HEMORRHOIDS  The polyps removed today have been sent for pathology.  The results can take 1-3 weeks to receive.  When your next colonoscopy should occur will be based on the pathology results.    You may resume your previous diet and medication schedule.  Thank you for allowing us to care for you today!!!   YOU HAD AN ENDOSCOPIC PROCEDURE TODAY AT THE [facility]:   Refer to the procedure report that was given to you for any specific questions about what was found during the examination.  If the procedure report does not answer your questions, please call your gastroenterologist to clarify.  If you requested that your care partner not be given the details of your procedure findings, then the procedure report has been included in a sealed envelope for you to review at your convenience later.  YOU SHOULD EXPECT: Some feelings of bloating in the abdomen. Passage of more gas than usual.  Walking can help get rid of the air that was put into your GI tract during the procedure and reduce the bloating. If you had a lower endoscopy (such as a colonoscopy or flexible sigmoidoscopy) you may notice spotting of blood in your stool or on the toilet paper. If you underwent a bowel prep for your procedure, you may not have a normal bowel movement for a few days.  Please Note:  You might notice some irritation and congestion in your nose or some drainage.  This is from the oxygen used during your procedure.  There is no need for concern and it should clear up in a day or so.  SYMPTOMS TO REPORT IMMEDIATELY:   Following lower endoscopy (colonoscopy or flexible sigmoidoscopy):  Excessive amounts of blood in the stool  Significant tenderness or worsening of abdominal pains  Swelling of the abdomen that is new, acute  Fever of 100F or higher  For urgent or emergent issues, a gastroenterologist can be reached at any hour by calling [phone]. Do  not use MyChart messaging for urgent concerns.    DIET:  We do recommend a small meal at first, but then you may proceed to your regular diet.  Drink plenty of fluids but you should avoid alcoholic beverages for 24 hours.  ACTIVITY:  You should plan to take it easy for the rest of today and you should NOT DRIVE or use heavy machinery until tomorrow (because of the sedation medicines used during the test).    FOLLOW UP: Our staff will call the number listed on your records 48-72 hours following your procedure to check on you and address any questions or concerns that you may have regarding the information given to you following your procedure. If we do not reach you, we will leave a message.  We will attempt to reach you two times.  During this call, we will ask if you have developed any symptoms of COVID 19. If you develop any symptoms (ie: fever, flu-like symptoms, shortness of breath, cough etc.) before then, please call [phone].  If you test positive for Covid 19 in the 2 weeks post procedure, please call and report this information to us.    If any biopsies were taken you will be contacted by phone or by letter within the next 1-3 weeks.  Please call us at [phone] if you have not heard about the biopsies in 3 weeks.    SIGNATURES/CONFIDENTIALITY: You and/or your care partner have signed paperwork which will   be entered into your electronic medical record.  These signatures attest to the fact that that the information above on your After Visit Summary has been reviewed and is understood.  Full responsibility of the confidentiality of this discharge information lies with you and/or your care-partner. 

## 2019-09-10 NOTE — Progress Notes (Signed)
Report given to PACU, vss 

## 2019-09-10 NOTE — Progress Notes (Signed)
Pt experienced vomiting of large amount of bile, Dr Silverio Decamp updated. Cleared to giv Zofran 4mg  IV, vss

## 2019-09-10 NOTE — Telephone Encounter (Signed)
Patient's wife calling states her husband seems to be disoriented at the moment and she wants to make sure everything is ok.

## 2019-09-10 NOTE — Progress Notes (Signed)
Pt's states no medical or surgical changes since previsit or office visit. 

## 2019-09-10 NOTE — Telephone Encounter (Signed)
Likely post anaesthesia but  if symptoms worsen will need to come to ER for further evaluation. Thanks

## 2019-09-10 NOTE — Progress Notes (Signed)
Called to room to assist during endoscopic procedure.  Patient ID and intended procedure confirmed with present staff. Received instructions for my participation in the procedure from the performing physician.  

## 2019-09-10 NOTE — Telephone Encounter (Signed)
Returned wife's call.  She states that her husband seems slightly disoriented.  She states that he "just stares at her".  She says that he has no other symptoms.  No pain or breathing problems.  She says that he has improved since she called 50 minutes ago after eating.  I explained to her that this could be the lingering effects of the propofol.  Advised her that I would inform Dr. Silverio Decamp and instructed her to call back if symptoms do not improve or worsen.

## 2019-09-10 NOTE — Op Note (Signed)
John Reese Patient Name: Gilles Hustead Procedure Date: 09/10/2019 7:59 AM MRN: [id number] Endoscopist: Mauri Pole , MD Age: 69 Referring MD:  Date of Birth: [birth year] Gender: Male Account #: [id number] Procedure:                Colonoscopy Indications:              Screening for colorectal malignant neoplasm Medicines:                Monitored Anesthesia Care Procedure:                Pre-Anesthesia Assessment:                           - Prior to the procedure, a History and Physical                            was performed, and patient medications and                            allergies were reviewed. The patient's tolerance of                            previous anesthesia was also reviewed. The risks                            and benefits of the procedure and the sedation                            options and risks were discussed with the patient.                            All questions were answered, and informed consent                            was obtained. Prior Anticoagulants: The patient has                            taken no previous anticoagulant or antiplatelet                            agents. ASA Grade Assessment: III - A patient with                            severe systemic disease. After reviewing the risks                            and benefits, the patient was deemed in                            satisfactory condition to undergo the procedure.                           After obtaining informed consent, the colonoscope  was passed under direct vision. Throughout the                            procedure, the patient's blood pressure, pulse, and                            oxygen saturations were monitored continuously. The                            Colonoscope was introduced through the anus and                            advanced to the the cecum, identified by                            appendiceal orifice  and ileocecal valve. The                            colonoscopy was performed without difficulty. The                            patient tolerated the procedure well. The quality                            of the bowel preparation was adequate to identify                            polyps 6 mm and larger in size. The ileocecal                            valve, appendiceal orifice, and rectum were                            photographed. Scope In: 8:04:17 AM Scope Out: 8:28:04 AM Scope Withdrawal Time: 0 hours 17 minutes 27 seconds  Total Procedure Duration: 0 hours 23 minutes 47 seconds  Findings:                 The perianal and digital rectal examinations were                            normal.                           Two sessile polyps were found in the transverse                            colon. The polyps were 6 to 8 mm in size. These                            polyps were removed with a cold snare. Resection                            and retrieval were complete.  A 20 mm polyp was found in the cecum. The polyp was                            sessile. The polyp was removed with a piecemeal                            technique using a cold snare. Resection and                            retrieval were complete.                           A 12 mm polyp was found in the sigmoid colon. The                            polyp was semi-pedunculated. The polyp was removed                            with a hot snare. Resection and retrieval were                            complete.                           A few small-mouthed diverticula were found in the                            sigmoid colon and descending colon.                           Non-bleeding internal hemorrhoids were found during                            retroflexion. The hemorrhoids were small. Complications:            No immediate complications. Estimated Blood Loss:     Estimated blood loss was  minimal. Impression:               - Two 6 to 8 mm polyps in the transverse colon,                            removed with a cold snare. Resected and retrieved.                           - One 20 mm polyp in the cecum, removed piecemeal                            using a cold snare. Resected and retrieved.                           - One 12 mm polyp in the sigmoid colon, removed                            with a hot  snare. Resected and retrieved.                           - Diverticulosis in the sigmoid colon and in the                            descending colon.                           - Non-bleeding internal hemorrhoids. Recommendation:           - Patient has a contact number available for                            emergencies. The signs and symptoms of potential                            delayed complications were discussed with the                            patient. Return to normal activities tomorrow.                            Written discharge instructions were provided to the                            patient.                           - Resume previous diet.                           - Continue present medications.                           - Await pathology results.                           - Repeat colonoscopy in 1 year for surveillance                            after piecemeal polypectomy and for surveillance                            based on pathology results. Mauri Pole, MD 09/10/2019 8:40:25 AM This report has been signed electronically.

## 2019-09-14 ENCOUNTER — Telehealth: Payer: Self-pay | Admitting: *Deleted

## 2019-09-14 NOTE — Telephone Encounter (Signed)
  Follow up Call-  Call back number 09/10/2019  Post procedure Call Back phone  # [phone]  Permission to leave phone message Yes  Some recent data might be hidden     Patient questions:  Do you have a fever, pain , or abdominal swelling? No. Pain Score  0 *  Have you tolerated food without any problems? Yes.    Have you been able to return to your normal activities? Yes.    Do you have any questions about your discharge instructions: Diet   No. Medications  No. Follow up visit  No.  Do you have questions or concerns about your Care? No.  Patient is feeling much better and back to his normal self.  No further questions.   Actions: * If pain score is 4 or above: No action needed, pain <4  1. Have you developed a fever since your procedure? NO  2.   Have you had an respiratory symptoms (SOB or cough) since your procedure? NO  3.   Have you tested positive for COVID 19 since your procedure NO  4.   Have you had any family members/close contacts diagnosed with the COVID 19 since your procedure?  NO   If yes to any of these questions please route to Joylene John, RN and Erenest Rasher, RN

## 2019-09-16 DIAGNOSIS — G500 Trigeminal neuralgia: Secondary | ICD-10-CM | POA: Diagnosis not present

## 2019-09-24 ENCOUNTER — Encounter: Payer: Self-pay | Admitting: Gastroenterology

## 2019-09-27 ENCOUNTER — Other Ambulatory Visit: Payer: Self-pay | Admitting: Neurology

## 2019-09-30 DIAGNOSIS — G500 Trigeminal neuralgia: Secondary | ICD-10-CM | POA: Diagnosis not present

## 2019-09-30 DIAGNOSIS — I10 Essential (primary) hypertension: Secondary | ICD-10-CM | POA: Diagnosis not present

## 2019-10-06 DIAGNOSIS — E1165 Type 2 diabetes mellitus with hyperglycemia: Secondary | ICD-10-CM | POA: Diagnosis not present

## 2019-10-06 DIAGNOSIS — S2243XA Multiple fractures of ribs, bilateral, initial encounter for closed fracture: Secondary | ICD-10-CM | POA: Diagnosis not present

## 2019-10-07 LAB — VITAMIN D 25 HYDROXY (VIT D DEFICIENCY, FRACTURES): Vit D, 25-Hydroxy: 43 ng/mL (ref 30–100)

## 2019-10-07 LAB — COMPLETE METABOLIC PANEL WITH GFR
AG Ratio: 2 (calc) (ref 1.0–2.5)
ALT: 14 U/L (ref 9–46)
AST: 14 U/L (ref 10–35)
Albumin: 4.1 g/dL (ref 3.6–5.1)
Alkaline phosphatase (APISO): 56 U/L (ref 35–144)
BUN: 16 mg/dL (ref 7–25)
CO2: 28 mmol/L (ref 20–32)
Calcium: 9.2 mg/dL (ref 8.6–10.3)
Chloride: 100 mmol/L (ref 98–110)
Creat: 0.96 mg/dL (ref 0.70–1.18)
GFR, Est African American: 92 mL/min/{1.73_m2} (ref >=60)
GFR, Est Non African American: 80 mL/min/{1.73_m2} (ref >=60)
Globulin: 2.1 g/dL (calc) (ref 1.9–3.7)
Glucose, Bld: 100 mg/dL — ABNORMAL HIGH (ref 65–99)
Potassium: 4.5 mmol/L (ref 3.5–5.3)
Sodium: 135 mmol/L (ref 135–146)
Total Bilirubin: 0.5 mg/dL (ref 0.2–1.2)
Total Protein: 6.2 g/dL (ref 6.1–8.1)

## 2019-10-07 LAB — TSH: TSH: 1.39 mIU/L (ref 0.40–4.50)

## 2019-10-07 LAB — MICROALBUMIN / CREATININE URINE RATIO
Creatinine, Urine: 145 mg/dL (ref 20–320)
Microalb Creat Ratio: 1 mcg/mg creat (ref <30)
Microalb, Ur: 0.2 mg/dL

## 2019-10-07 LAB — T4, FREE: Free T4: 1 ng/dL (ref 0.8–1.8)

## 2019-10-12 ENCOUNTER — Encounter: Payer: Self-pay | Admitting: "Endocrinology

## 2019-10-12 ENCOUNTER — Other Ambulatory Visit: Payer: Self-pay

## 2019-10-12 ENCOUNTER — Ambulatory Visit: Payer: PPO | Admitting: "Endocrinology

## 2019-10-12 VITALS — BP 136/77 | HR 80 | Ht 73.0 in | Wt 151.8 lb

## 2019-10-12 DIAGNOSIS — E1165 Type 2 diabetes mellitus with hyperglycemia: Secondary | ICD-10-CM

## 2019-10-12 DIAGNOSIS — E782 Mixed hyperlipidemia: Secondary | ICD-10-CM | POA: Diagnosis not present

## 2019-10-12 DIAGNOSIS — I10 Essential (primary) hypertension: Secondary | ICD-10-CM | POA: Diagnosis not present

## 2019-10-12 LAB — POCT GLYCOSYLATED HEMOGLOBIN (HGB A1C): Hemoglobin A1C: 7.4 % — AB (ref 4.0–5.6)

## 2019-10-12 MED ORDER — TOUJEO SOLOSTAR 300 UNIT/ML ~~LOC~~ SOPN: 10.0000 [IU] | PEN_INJECTOR | Freq: Every day | SUBCUTANEOUS | 2 refills | Status: DC

## 2019-10-12 NOTE — Progress Notes (Signed)
10/12/2019             Endocrinology follow-up note   Subjective:    Patient ID: John Reese, male    DOB: 1950/09/07, PCP John Reese, John Kaufmann, MD   Past Medical History:  Diagnosis Date  . Auditory disturbance    Decreased auditory acuity  . 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 Diabetes ([place])   . Dyslipidemia   . Tremor   . Trigeminal neuralgia    Right, V2 distribution   Past Surgical History:  Procedure Laterality Date  . FINGER AMPUTATION    . INNER EAR SURGERY Right    Ear drum repair  . PULSE GENERATOR IMPLANT Bilateral 03/30/2016   Procedure: BILATERAL PLACEMENT OF IMPLANTABLE PULSE GENERATOR;  Surgeon: Erline Levine, MD;  Location: [place];  Service: Neurosurgery;  Laterality: Bilateral;  BILATERAL PLACEMENT OF IMPLANTABLE PULSE GENERATOR  . SUBTHALAMIC STIMULATOR INSERTION Bilateral 03/22/2016   Procedure: BILATERAL DEEP BRAIN STIMULATOR PLACEMENT WITH STARFIX WITH DR. Carles Collet;  Surgeon: Erline Levine, MD;  Location: [place];  Service: Neurosurgery;  Laterality: Bilateral;   Social History   Socioeconomic History  . Marital status: Married    Spouse name: Not on file  . Number of children: 2  . Years of education: HS  . Highest education level: 12th grade  Occupational History  . Occupation: Works in Optometrist: [employer]    Comment: RETIRED  Tobacco Use  . Smoking status: Never Smoker  . Smokeless tobacco: Never Used  Vaping Use  . Vaping Use: Never used  Substance and Sexual Activity  . Alcohol use: No  . Drug use: No  . Sexual activity: Not on file  Other Topics Concern  . Not on file  Social History Narrative   Patient is right handed.   Patient drinks 2 cups coffee daily.   Social Determinants of Health   Financial Resource Strain:   . Difficulty of Paying Living Expenses:   Food Insecurity:   . Worried About Charity fundraiser in the Last Year:   . Arboriculturist in the Last Year:    Transportation Needs:   . Film/video editor (Medical):   Marland Kitchen Lack of Transportation (Non-Medical):   Physical Activity:   . Days of Exercise per Week:   . Minutes of Exercise per Session:   Stress:   . Feeling of Stress :   Social Connections:   . Frequency of Communication with Friends and Family:   . Frequency of Social Gatherings with Friends and Family:   . Attends Religious Services:   . Active Member of Clubs or Organizations:   . Attends Archivist Meetings:   Marland Kitchen Marital Status:    Outpatient Encounter Medications as of 10/12/2019  Medication Sig  . aspirin EC 81 MG tablet Take 81 mg by mouth daily.  . carbamazepine (TEGRETOL) 200 MG tablet Take 1 tablet (200 mg total) by mouth 2 (two) times daily.  Marland Kitchen CINNAMON PO Take 1,000 mg by mouth daily.  . fish oil-omega-3 fatty acids 1000 MG capsule Take 2 g by mouth daily.  Marland Kitchen glipiZIDE (GLUCOTROL XL) 2.5 MG 24 hr tablet Take 1 tablet (2.5 mg total) by mouth daily with breakfast.  . ibuprofen (ADVIL,MOTRIN) 200 MG tablet Take 800 mg by mouth as needed for moderate pain.  Marland Kitchen insulin glargine, 1 Unit Dial, (TOUJEO SOLOSTAR) 300 UNIT/ML Solostar Pen Inject 10 Units into the  skin at bedtime.  . Insulin Pen Needle (ULTICARE MINI PEN NEEDLES) 31G X 6 MM MISC USE AS DIRECTED AT BEDTIME  . Multiple Vitamin (MULTIVITAMIN) tablet Take 1 tablet by mouth daily.  . pravastatin (PRAVACHOL) 40 MG tablet Take 1 tablet (40 mg total) by mouth daily.  . pregabalin (LYRICA) 75 MG capsule Take 1 capsule by mouth twice daily  . [DISCONTINUED] TOUJEO SOLOSTAR 300 UNIT/ML Solostar Pen INJECT 12 UNITS INTO THE SKIN AT BEDTIME.   No facility-administered encounter medications on file as of 10/12/2019.   ALLERGIES: Allergies  Allergen Reactions  . No Known Allergies    VACCINATION STATUS: Immunization History  Administered Date(s) Administered  . Fluad Quad(high Dose 65+) 12/30/2018  . Influenza, High Dose Seasonal PF 02/07/2015, 02/01/2016,  01/02/2017, 01/24/2018, 01/24/2018  . Influenza-Unspecified 02/02/2014  . PFIZER SARS-COV-2 Vaccination 05/10/2019, 05/28/2019  . Pneumococcal Conjugate-13 04/07/2015  . Pneumococcal Polysaccharide-23 07/02/2016, 12/30/2018  . Tdap 08/23/2017  . Zoster 12/02/2012    Diabetes He presents for his follow-up diabetic visit. He has type 2 diabetes mellitus. Onset time: He was diagnosed at approximate age of 17 years. His disease course has been stable (He is diagnosed with essential tremors since last visit, following with neurology.). There are no hypoglycemic associated symptoms. Pertinent negatives for hypoglycemia include no confusion, headaches, pallor or seizures. There are no diabetic associated symptoms. Pertinent negatives for diabetes include no chest pain, no fatigue, no polydipsia, no polyphagia, no polyuria and no weakness. There are no hypoglycemic complications. Symptoms are improving. There are no diabetic complications. Risk factors for coronary artery disease include diabetes mellitus, dyslipidemia, hypertension, male sex and sedentary lifestyle. Current diabetic treatment includes insulin injections and oral agent (monotherapy). He is compliant with treatment most of the time. His weight is fluctuating minimally. He is following a generally unhealthy diet. When asked about meal planning, he reported none. He has had a previous visit with a dietitian. He participates in exercise intermittently. His home blood glucose trend is decreasing steadily. His breakfast blood glucose range is generally 90-110 mg/dl. His bedtime blood glucose range is generally 180-200 mg/dl. His overall blood glucose range is 140-180 mg/dl. (He presents with his logs showing tight glycemic control at fasting, slightly above target bedtime readings.  His point-of-care A1c is 7.4% improving from 8.5%.  Did have some rare, mild fasting hypoglycemia.   ) An ACE inhibitor/angiotensin II receptor blocker is being taken.   Hyperlipidemia This is a chronic problem. The current episode started more than 1 year ago. The problem is controlled. Exacerbating diseases include diabetes. Pertinent negatives include no chest pain, myalgias or shortness of breath. Current antihyperlipidemic treatment includes statins. Risk factors for coronary artery disease include diabetes mellitus, dyslipidemia, hypertension, male sex and a sedentary lifestyle.  Hypertension This is a chronic problem. The current episode started more than 1 year ago. The problem is uncontrolled. Pertinent negatives include no chest pain, headaches, neck pain, palpitations or shortness of breath. Risk factors for coronary artery disease include dyslipidemia and diabetes mellitus. Past treatments include ACE inhibitors.    Review of systems  Constitutional: + Minimally fluctuating body weight,  current  Body mass index is 20.03 kg/m. , no fatigue, no subjective hyperthermia, no subjective hypothermia Eyes: no blurry vision, no xerophthalmia ENT: no sore throat, no nodules palpated in throat, no dysphagia/odynophagia, no hoarseness Cardiovascular: no Chest Pain, no Shortness of Breath, no palpitations, no leg swelling Respiratory: no cough, no shortness of breath Gastrointestinal: no Nausea/Vomiting/Diarhhea Musculoskeletal: no muscle/joint aches Skin:  no rashes, no hyperemia Neurological: no tremors, no numbness, no tingling, no dizziness Psychiatric: no depression, no anxiety   Objective:    BP 136/77   Pulse 80   Ht 6\' 1"  (1.854 m)   Wt 151 lb 12.8 oz (68.9 kg)   BMI 20.03 kg/m   Wt Readings from Last 3 Encounters:  10/12/19 151 lb 12.8 oz (68.9 kg)  09/10/19 155 lb (70.3 kg)  08/27/19 155 lb (70.3 kg)     Physical Exam- Limited  Constitutional:  Body mass index is 20.03 kg/m. , not in acute distress, normal state of mind Eyes:  EOMI, no exophthalmos Neck: Supple Thyroid: No gross goiter Respiratory: Adequate breathing  efforts Musculoskeletal: no gross deformities, strength intact in all four extremities, no gross restriction of joint movements Skin:  no rashes, no hyperemia Neurological: no tremor with outstretched hands,    Results for orders placed or performed in visit on 10/12/19  HgB A1c  Result Value Ref Range   Hemoglobin A1C 7.4 (A) 4.0 - 5.6 %   HbA1c POC (<> result, manual entry)     HbA1c, POC (prediabetic range)     HbA1c, POC (controlled diabetic range)     Diabetic Labs (most recent): Lab Results  Component Value Date   HGBA1C 7.4 (A) 10/12/2019   HGBA1C 8.5 (A) 06/04/2019   HGBA1C 7.8 (H) 01/27/2019   Lipid Panel     Component Value Date/Time   CHOL 162 09/23/2018 0749   TRIG 66 09/23/2018 0749   HDL 47 09/23/2018 0749   CHOLHDL 3.4 09/23/2018 0749   VLDL 20 06/27/2015 0816   LDLCALC 100 (H) 09/23/2018 0749   CMP Latest Ref Rng & Units 10/06/2019 01/27/2019 09/23/2018  Glucose 65 - 99 mg/dL 100(H) 106(H) 137(H)  BUN 7 - 25 mg/dL 16 19 22   Creatinine 0.70 - 1.18 mg/dL 0.96 1.04 1.02  Sodium 135 - 146 mmol/L 135 139 139  Potassium 3.5 - 5.3 mmol/L 4.5 4.4 4.8  Chloride 98 - 110 mmol/L 100 103 104  CO2 20 - 32 mmol/L 28 31 30   Calcium 8.6 - 10.3 mg/dL 9.2 9.4 9.6  Total Protein 6.1 - 8.1 g/dL 6.2 6.3 6.4  Total Bilirubin 0.2 - 1.2 mg/dL 0.5 0.4 0.5  Alkaline Phos 38 - 126 U/L - - -  AST 10 - 35 U/L 14 14 15   ALT 9 - 46 U/L 14 16 18      Assessment & Plan:   1. Uncontrolled type 2 diabetes mellitus without complication, with long-term current use of insulin (HCC) -No gross complications from his diabetes so far, however patient remains at a high risk for more acute and chronic complications of diabetes which include CAD, CVA, CKD, retinopathy, and neuropathy. These are all discussed in detail with the patient. He presents with his logs showing tight glycemic control at fasting, slightly above target bedtime readings.  His point-of-care A1c is 7.4% improving from 8.5%.  Did  have some rare, mild fasting hypoglycemia.    - I have re-counseled the patient on diet management  by adopting a carbohydrate restricted / protein rich  Diet.  -  Suggestion is made for him to avoid simple carbohydrates  from his diet including Cakes, Sweet Desserts / Pastries, Ice Cream, Soda (diet and regular), Sweet Tea, Candies, Chips, Cookies, Sweet Pastries,  Store Bought Juices, Alcohol in Excess of  1-2 drinks a day, Artificial Sweeteners, Coffee Creamer, and "Sugar-free" Products. This will help patient to have stable blood glucose  profile and potentially avoid unintended weight gain.  - Patient is advised to stick to a routine mealtimes to eat 3 meals  a day and avoid unnecessary snacks ( to snack only to correct hypoglycemia).  - I have approached patient with the following individualized plan to manage diabetes and patient agrees.  -It was previously determined that his c-peptide is critically low at 0.5, indicating pancreatic exhaustion.  He may very soon require intensive treatment with basal/bolus insulin in order for him to maintain control of diabetes to target.     -This patient has a propensity to fall, recently suspected of having vasovagal syncope.  Priority #1 would be to avoid hypoglycemia. -He will not tolerate any higher dose of basal insulin.  He is advised to lower his Toujeo to 10 units nightly, continue glipizide 2.5 mg XL p.o. daily at breakfast.  He is advised to continue monitoring blood glucose twice a day-daily before breakfast and at bedtime.    -He is encouraged to call clinic if he is blood glucose readings are greater than 200 mg/dL x 3. - His GAD antibodies are negative, however does not rule out a possibility of LADA.   He is not a candidate for incretin therapy, SGLT2 inhibitor therapy. - Patient specific target  for A1c; LDL, HDL, Triglycerides,  were discussed in detail.  2) BP/HTN:  His blood pressure is controlled to target.   He is not currently  on antihypertensive medication.  3) Lipids/HPL: His recent lipid panel showed uncontrolled LDL at 113.  He is advised to continue pravastatin 40 mg p.o. nightly.   Side effects and precautions discussed with him.     4)  Weight/Diet: He has gained 8 pounds since last visit, a good development for him.  He is not a candidate for weight loss.    exercise, and carbohydrates information provided.  5) Chronic Care/Health Maintenance:  -Patient is on ACEI/ARB and Statin medications and encouraged to continue to follow up with Ophthalmology, Podiatrist at least yearly or according to recommendations, and advised to  stay away from smoking. I have recommended yearly flu vaccine and pneumonia vaccination at least every 5 years; moderate intensity exercise for up to 150 minutes weekly; and  sleep for at least 7 hours a day. - He was recently diagnosed with essential tremors. I advised him to continue follow-up with neurology.   - I advised patient to maintain close follow up with John Reese, John Kaufmann, MD for primary care needs.  - Time spent on this patient care encounter:  35 min, of which > 50% was spent in  counseling and the rest reviewing his blood glucose logs , discussing his hypoglycemia and hyperglycemia episodes, reviewing his current and  previous labs / studies  ( including abstraction from other facilities) and medications  doses and developing a  long term treatment plan and documenting his care.   Please refer to Patient Instructions for Blood Glucose Monitoring and Insulin/Medications Dosing Guide"  in media tab for additional information. Please  also refer to " Patient Self Inventory" in the Media  tab for reviewed elements of pertinent patient history.  John Reese participated in the discussions, expressed understanding, and voiced agreement with the above plans.  All questions were answered to his satisfaction. he is encouraged to contact clinic should he have any questions or  concerns prior to his return visit.   Follow up plan: -Return in about 4 months (around 02/11/2020) for Bring Meter and Logs- A1c in  Office.  John Tarique, MD Phone: [phone]  Fax: [phone]  -  This note was partially dictated with voice recognition software. Similar sounding words can be transcribed inadequately or may not  be corrected upon review.  10/12/2019, 11:00 AM

## 2019-10-12 NOTE — Patient Instructions (Signed)

## 2019-10-21 DIAGNOSIS — G500 Trigeminal neuralgia: Secondary | ICD-10-CM | POA: Diagnosis not present

## 2019-11-09 DIAGNOSIS — G250 Essential tremor: Secondary | ICD-10-CM | POA: Diagnosis not present

## 2019-11-09 DIAGNOSIS — G500 Trigeminal neuralgia: Secondary | ICD-10-CM | POA: Diagnosis not present

## 2019-11-19 DIAGNOSIS — G500 Trigeminal neuralgia: Secondary | ICD-10-CM | POA: Diagnosis not present

## 2019-11-19 DIAGNOSIS — G250 Essential tremor: Secondary | ICD-10-CM | POA: Diagnosis not present

## 2019-11-22 ENCOUNTER — Other Ambulatory Visit: Payer: Self-pay | Admitting: "Endocrinology

## 2020-01-04 DIAGNOSIS — G250 Essential tremor: Secondary | ICD-10-CM | POA: Diagnosis not present

## 2020-01-04 DIAGNOSIS — G500 Trigeminal neuralgia: Secondary | ICD-10-CM | POA: Diagnosis not present

## 2020-01-07 ENCOUNTER — Ambulatory Visit: Payer: PPO | Attending: Internal Medicine

## 2020-01-07 DIAGNOSIS — Z23 Encounter for immunization: Secondary | ICD-10-CM

## 2020-01-07 NOTE — Progress Notes (Signed)
   Covid-19 Vaccination Clinic  Name:  John Reese    MRN: [id number] DOB: 1950/11/29  01/07/2020  John Reese was observed post Covid-19 immunization for 15 minutes without incident. He was provided with Vaccine Information Sheet and instruction to access the V-Safe system.   John Reese was instructed to call 911 with any severe reactions post vaccine: Marland Kitchen Difficulty breathing  . Swelling of face and throat  . A fast heartbeat  . A bad rash all over body  . Dizziness and weakness
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
## 2020-01-20 DIAGNOSIS — G500 Trigeminal neuralgia: Secondary | ICD-10-CM | POA: Diagnosis not present

## 2020-01-21 ENCOUNTER — Other Ambulatory Visit: Payer: Self-pay | Admitting: Family Medicine

## 2020-01-21 DIAGNOSIS — E782 Mixed hyperlipidemia: Secondary | ICD-10-CM

## 2020-01-28 ENCOUNTER — Telehealth: Payer: Self-pay

## 2020-01-28 DIAGNOSIS — G500 Trigeminal neuralgia: Secondary | ICD-10-CM | POA: Diagnosis not present

## 2020-01-28 NOTE — Telephone Encounter (Signed)
I requested records to be sent over from [facility].

## 2020-01-28 NOTE — Progress Notes (Signed)
Assessment/Plan:    1.  Essential Tremor   -Status post DBS surgery.  IPG last changed March 22, 2016.  2.  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  -Dr. Vertell Limber weaning pt from Lyrica (now 75 q day) and carbamazepine, 200 mg qod.  Pt states is weaning off med with goal of dc but last time he tried the pain returned  -had V2/V3 rhizotomy with Dr. Vertell Limber 10/2019  -had repeat V2/V3 rhizotomy with Dr. Vertell Limber on Thursday.  Has paresthesias of R side of face.  May be permanent.  Subjective:   John Reese was seen today in follow up for essential tremor.  My previous records were reviewed prior to todays visit. Pt denies falls.  Pt denies lightheadedness, near syncope.  No hallucinations.  Mood has been good.  Last visit, patient was referred to Dr. Maryjean Ka for consideration of trigeminal nerve blocks. Pt states that he had those without success. I called [facility] and just got records.  Pt had TG RF ablation/rhizotomy in July that helped but when he tried to go off of med the sx's came back.  He returned to Dr. Vertell Limber on 9/29 and he had repeat V2/V3 rhizotomy on Thursday.  His entire R face in V2/V3 distribution is numb and he is weaning off of medications now.  Current prescribed movement disorder medications: Carbamazepine, 200 mg twice per day (now qod - stern weaning) Lyrica, 75 mg twice per day (now q day - stern weaning)   ALLERGIES:   Allergies  Allergen Reactions   No Known Allergies     CURRENT MEDICATIONS:  Outpatient Encounter Medications as of 02/02/2020  Medication Sig   aspirin EC 81 MG tablet Take 81 mg by mouth daily.   carbamazepine (TEGRETOL) 200 MG tablet Take 1 tablet (200 mg total) by mouth 2 (two) times daily.   CINNAMON PO Take 1,000 mg by mouth daily.   fish oil-omega-3 fatty acids 1000 MG capsule Take 2 g by mouth daily.   glipiZIDE (GLUCOTROL XL) 2.5 MG 24 hr tablet TAKE 1 TABLET  (2.5 MG TOTAL) BY MOUTH DAILY WITH BREAKFAST.   ibuprofen (ADVIL,MOTRIN) 200 MG tablet Take 800 mg by mouth as needed for moderate pain.   insulin glargine, 1 Unit Dial, (TOUJEO SOLOSTAR) 300 UNIT/ML Solostar Pen Inject 10 Units into the skin at bedtime.   Insulin Pen Needle (ULTICARE MINI PEN NEEDLES) 31G X 6 MM MISC USE AS DIRECTED AT BEDTIME   Multiple Vitamin (MULTIVITAMIN) tablet Take 1 tablet by mouth daily.   pravastatin (PRAVACHOL) 40 MG tablet TAKE 1 TABLET BY MOUTH EVERY DAY   pregabalin (LYRICA) 75 MG capsule Take 1 capsule by mouth twice daily   No facility-administered encounter medications on file as of 02/02/2020.     Objective:    PHYSICAL EXAMINATION:    VITALS:   Vitals:   02/02/20 1041  BP: (!) 148/75  Pulse: 86  SpO2: 100%  Weight: 147 lb (66.7 kg)  Height: 6\' 1"  (1.854 m)    GEN:  The patient appears stated age and is in NAD. HEENT:  Normocephalic, atraumatic.  The mucous membranes are moist. The superficial temporal arteries are without ropiness or tenderness.   Neurological examination:  Orientation: The patient is alert and oriented x3. Cranial nerves: There is good facial symmetry. The speech is fluent and clear. Soft palate rises symmetrically and there is no tongue deviation. Hearing is intact to conversational  tone. Sensation: Sensation is intact to light touch throughout Motor: Strength is at least antigravity x4.  Movement examination: Tone: There is normal tone in the UE/LE Abnormal movements: none Coordination:  There is no decremation with RAM's Gait and Station: The patient has no difficulty arising out of a deep-seated chair without the use of the hands. The patient's stride length is good I have reviewed and interpreted the following labs independently   Chemistry      Component Value Date/Time   NA 135 10/06/2019 0925   NA 138 10/26/2015 1026   K 4.5 10/06/2019 0925   CL 100 10/06/2019 0925   CO2 28 10/06/2019 0925   BUN  16 10/06/2019 0925   BUN 18 10/26/2015 1026   CREATININE 0.96 10/06/2019 0925      Component Value Date/Time   CALCIUM 9.2 10/06/2019 0925   ALKPHOS 65 09/06/2017 1125   AST 14 10/06/2019 0925   ALT 14 10/06/2019 0925   BILITOT 0.5 10/06/2019 0925   BILITOT <0.2 10/26/2015 1026      Lab Results  Component Value Date   WBC 5.9 09/07/2017   HGB 10.7 (L) 09/07/2017   HCT 32.0 (L) 09/07/2017   MCV 86.7 09/07/2017   PLT 135 (L) 09/07/2017   Lab Results  Component Value Date   TSH 1.39 10/06/2019     Chemistry      Component Value Date/Time   NA 135 10/06/2019 0925   NA 138 10/26/2015 1026   K 4.5 10/06/2019 0925   CL 100 10/06/2019 0925   CO2 28 10/06/2019 0925   BUN 16 10/06/2019 0925   BUN 18 10/26/2015 1026   CREATININE 0.96 10/06/2019 0925      Component Value Date/Time   CALCIUM 9.2 10/06/2019 0925   ALKPHOS 65 09/06/2017 1125   AST 14 10/06/2019 0925   ALT 14 10/06/2019 0925   BILITOT 0.5 10/06/2019 0925   BILITOT <0.2 10/26/2015 1026         Total time spent on today's visit was 25 minutes, including both face-to-face time and nonface-to-face time.  Time included that spent on review of records (prior notes available to me/labs/imaging if pertinent), discussing treatment and goals, answering patient's questions and coordinating care.  This was separate from dbs time  Cc:  [name], Fransisca Kaufmann, MD

## 2020-02-02 ENCOUNTER — Encounter: Payer: Self-pay | Admitting: Neurology

## 2020-02-02 ENCOUNTER — Other Ambulatory Visit: Payer: Self-pay

## 2020-02-02 ENCOUNTER — Ambulatory Visit: Payer: PPO | Admitting: Neurology

## 2020-02-02 VITALS — BP 148/75 | HR 86 | Ht 73.0 in | Wt 147.0 lb

## 2020-02-02 DIAGNOSIS — G500 Trigeminal neuralgia: Secondary | ICD-10-CM | POA: Diagnosis not present

## 2020-02-02 DIAGNOSIS — G250 Essential tremor: Secondary | ICD-10-CM

## 2020-02-02 NOTE — Procedures (Signed)
DBS Programming was performed.    Manufacturer of DBS device: [employer]  Total time spent programming was 10 minutes.  Device was confirmed to be on.  Soft start was confirmed to be on.  Impedences were checked and were within normal limits.  Battery was checked and was determined to be functioning normally and not near the end of life.  Final settings were as follows:   Active Contact Amplitude (V) PW (ms) Frequency (hz) Side Effects Battery  Left Brain 1-2+ 2.8 90 150  2.85  02/02/20                                Right Brain        02/02/20 1-2+ 2.7 90 150  2.83

## 2020-02-05 ENCOUNTER — Ambulatory Visit (INDEPENDENT_AMBULATORY_CARE_PROVIDER_SITE_OTHER): Payer: PPO | Admitting: Family Medicine

## 2020-02-05 ENCOUNTER — Other Ambulatory Visit: Payer: Self-pay

## 2020-02-05 ENCOUNTER — Encounter: Payer: Self-pay | Admitting: Family Medicine

## 2020-02-05 VITALS — BP 143/77 | HR 85 | Temp 97.0°F | Ht 73.0 in | Wt 147.0 lb

## 2020-02-05 DIAGNOSIS — E1169 Type 2 diabetes mellitus with other specified complication: Secondary | ICD-10-CM | POA: Diagnosis not present

## 2020-02-05 DIAGNOSIS — I10 Essential (primary) hypertension: Secondary | ICD-10-CM

## 2020-02-05 DIAGNOSIS — E782 Mixed hyperlipidemia: Secondary | ICD-10-CM

## 2020-02-05 DIAGNOSIS — Z794 Long term (current) use of insulin: Secondary | ICD-10-CM

## 2020-02-05 DIAGNOSIS — T161XXA Foreign body in right ear, initial encounter: Secondary | ICD-10-CM

## 2020-02-05 DIAGNOSIS — Z23 Encounter for immunization: Secondary | ICD-10-CM | POA: Diagnosis not present

## 2020-02-05 NOTE — Progress Notes (Signed)
BP (!) 143/77   Pulse 85   Temp (!) 97 F (36.1 C)   Ht 6\' 1"  (1.854 m)   Wt 147 lb (66.7 kg)   SpO2 100%   BMI 19.39 kg/m    Subjective:   Patient ID: John Reese, male    DOB: 10/01/1950, 69 y.o.   MRN: [id number]  HPI: John Reese is a 69 y.o. male presenting on 02/05/2020 for Medical Management of Chronic Issues and Diabetes   HPI Type 2 diabetes mellitus Patient comes in today for recheck of his diabetes. Patient has been currently taking last A1c was better at 7.4, he sees Dr. Dorien Chihuahua with endocrinology. Patient is not currently on an ACE inhibitor/ARB. Patient has seen an ophthalmologist this year. Patient denies any issues with their feet. The symptom started onset as an adult hypertension hyperlipidemia ARE RELATED TO DM   Hyperlipidemia Patient is coming in for recheck of his hyperlipidemia. The patient is currently taking pravastatin and fish oil. They deny any issues with myalgias or history of liver damage from it. They deny any focal numbness or weakness or chest pain.   Hypertension Patient is currently on no medication currently has been diet controlled, and their blood pressure today is 143/77. Patient denies any lightheadedness or dizziness. Patient denies headaches, blurred vision, chest pains, shortness of breath, or weakness. Denies any side effects from medication and is content with current medication.   Relevant past medical, surgical, family and social history reviewed and updated as indicated. Interim medical history since our last visit reviewed. Allergies and medications reviewed and updated.  Review of Systems  Constitutional: Negative for chills and fever.  Eyes: Negative for visual disturbance.  Respiratory: Negative for shortness of breath and wheezing.   Cardiovascular: Negative for chest pain and leg swelling.  Musculoskeletal: Negative for back pain and gait problem.  Skin: Negative for rash.  Neurological: Negative for dizziness,  weakness and light-headedness.  All other systems reviewed and are negative.   Per HPI unless specifically indicated above   Allergies as of 02/05/2020      Reactions   No Known Allergies       Medication List       Accurate as of February 05, 2020  8:08 AM. If you have any questions, ask your nurse or doctor.        aspirin EC 81 MG tablet Take 81 mg by mouth daily.   carbamazepine 200 MG tablet Commonly known as: TEGRETOL Take 1 tablet (200 mg total) by mouth 2 (two) times daily.   CINNAMON PO Take 1,000 mg by mouth daily.   fish oil-omega-3 fatty acids 1000 MG capsule Take 2 g by mouth daily.   glipiZIDE 2.5 MG 24 hr tablet Commonly known as: GLUCOTROL XL TAKE 1 TABLET (2.5 MG TOTAL) BY MOUTH DAILY WITH BREAKFAST.   ibuprofen 200 MG tablet Commonly known as: ADVIL Take 800 mg by mouth as needed for moderate pain.   multivitamin tablet Take 1 tablet by mouth daily.   pravastatin 40 MG tablet Commonly known as: PRAVACHOL TAKE 1 TABLET BY MOUTH EVERY DAY   pregabalin 75 MG capsule Commonly known as: LYRICA Take 1 capsule by mouth twice daily   Toujeo SoloStar 300 UNIT/ML Solostar Pen Generic drug: insulin glargine (1 Unit Dial) Inject 10 Units into the skin at bedtime.   UltiCare Mini Pen Needles 31G X 6 MM Misc Generic drug: Insulin Pen Needle USE AS DIRECTED AT BEDTIME  Objective:   BP (!) 143/77   Pulse 85   Temp (!) 97 F (36.1 C)   Ht 6\' 1"  (1.854 m)   Wt 147 lb (66.7 kg)   SpO2 100%   BMI 19.39 kg/m   Wt Readings from Last 3 Encounters:  02/05/20 147 lb (66.7 kg)  02/02/20 147 lb (66.7 kg)  10/12/19 151 lb 12.8 oz (68.9 kg)    Physical Exam Vitals and nursing note reviewed.  Constitutional:      General: He is not in acute distress.    Appearance: He is well-developed. He is not diaphoretic.  HENT:     Right Ear: A foreign body (Small white foreign body that appears to be his filter from his hearing aid in the right ear  canal) is present.  Eyes:     General: No scleral icterus.       Right eye: No discharge.     Conjunctiva/sclera: Conjunctivae normal.     Pupils: Pupils are equal, round, and reactive to light.  Neck:     Thyroid: No thyromegaly.  Cardiovascular:     Rate and Rhythm: Normal rate and regular rhythm.     Heart sounds: Normal heart sounds. No murmur heard.   Pulmonary:     Effort: Pulmonary effort is normal. No respiratory distress.     Breath sounds: Normal breath sounds. No wheezing.  Musculoskeletal:        General: Normal range of motion.     Cervical back: Neck supple.  Lymphadenopathy:     Cervical: No cervical adenopathy.  Skin:    General: Skin is warm and dry.     Findings: No rash.  Neurological:     Mental Status: He is alert and oriented to person, place, and time.     Coordination: Coordination normal.  Psychiatric:        Behavior: Behavior normal.     Nurse to lavage, able to remove foreign body  Assessment & Plan:   Problem List Items Addressed This Visit      Cardiovascular and Mediastinum   Essential hypertension     Endocrine   Type 2 diabetes mellitus with other specified complication ([place]) - Primary   Relevant Orders   Bayer DCA Hb A1c Waived     Other   Mixed hyperlipidemia    Other Visit Diagnoses    Flu vaccine need       Relevant Orders   Flu Vaccine QUAD High Dose(Fluad) (Completed)   Ear foreign body, right, initial encounter          Follow-up in 6 months, continue follow-up with Dr. Dorien Chihuahua Follow up plan: Return in about 6 months (around 08/05/2020), or if symptoms worsen or fail to improve, for Physical exam and cholesterol.  Counseling provided for all of the vaccine components Orders Placed This Encounter  Procedures  . Flu Vaccine QUAD High Dose(Fluad)  . Bayer [facility] Hb A1c Waived    Caryl Pina, MD [facility] 02/05/2020, 8:08 AM

## 2020-02-12 ENCOUNTER — Other Ambulatory Visit: Payer: Self-pay

## 2020-02-12 ENCOUNTER — Encounter: Payer: Self-pay | Admitting: "Endocrinology

## 2020-02-12 ENCOUNTER — Ambulatory Visit (INDEPENDENT_AMBULATORY_CARE_PROVIDER_SITE_OTHER): Payer: PPO | Admitting: "Endocrinology

## 2020-02-12 VITALS — BP 125/69 | HR 80 | Ht 73.0 in | Wt 148.2 lb

## 2020-02-12 DIAGNOSIS — E1165 Type 2 diabetes mellitus with hyperglycemia: Secondary | ICD-10-CM

## 2020-02-12 LAB — POCT GLYCOSYLATED HEMOGLOBIN (HGB A1C): Hemoglobin A1C: 7.5 % — AB (ref 4.0–5.6)

## 2020-02-12 NOTE — Progress Notes (Signed)
02/12/2020             Endocrinology follow-up note   Subjective:    Patient ID: John Reese, male    DOB: 02-05-1951, PCP [name], Fransisca Kaufmann, MD   Past Medical History:  Diagnosis Date  . Auditory disturbance    Decreased auditory acuity  . 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 Diabetes ([place])   . Dyslipidemia   . Tremor   . Trigeminal neuralgia    Right, V2 distribution   Past Surgical History:  Procedure Laterality Date  . FINGER AMPUTATION    . INNER EAR SURGERY Right    Ear drum repair  . PULSE GENERATOR IMPLANT Bilateral 03/30/2016   Procedure: BILATERAL PLACEMENT OF IMPLANTABLE PULSE GENERATOR;  Surgeon: Erline Levine, MD;  Location: [facility];  Service: Neurosurgery;  Laterality: Bilateral;  BILATERAL PLACEMENT OF IMPLANTABLE PULSE GENERATOR  . SUBTHALAMIC STIMULATOR INSERTION Bilateral 03/22/2016   Procedure: BILATERAL DEEP BRAIN STIMULATOR PLACEMENT WITH STARFIX WITH DR. Carles Collet;  Surgeon: Erline Levine, MD;  Location: [facility];  Service: Neurosurgery;  Laterality: Bilateral;   Social History   Socioeconomic History  . Marital status: Married    Spouse name: Not on file  . Number of children: 2  . Years of education: HS  . Highest education level: 12th grade  Occupational History  . Occupation: Works in Optometrist: [employer]    Comment: RETIRED  Tobacco Use  . Smoking status: Never Smoker  . Smokeless tobacco: Never Used  Vaping Use  . Vaping Use: Never used  Substance and Sexual Activity  . Alcohol use: No  . Drug use: No  . Sexual activity: Not on file  Other Topics Concern  . Not on file  Social History Narrative   Patient is right handed.   Patient drinks 2 cups coffee daily.   Social Determinants of Health   Financial Resource Strain:   . Difficulty of Paying Living Expenses: Not on file  Food Insecurity:   . Worried About Charity fundraiser in the Last Year: Not on file  . Ran Out of Food  in the Last Year: Not on file  Transportation Needs:   . Lack of Transportation (Medical): Not on file  . Lack of Transportation (Non-Medical): Not on file  Physical Activity:   . Days of Exercise per Week: Not on file  . Minutes of Exercise per Session: Not on file  Stress:   . Feeling of Stress : Not on file  Social Connections:   . Frequency of Communication with Friends and Family: Not on file  . Frequency of Social Gatherings with Friends and Family: Not on file  . Attends Religious Services: Not on file  . Active Member of Clubs or Organizations: Not on file  . Attends Archivist Meetings: Not on file  . Marital Status: Not on file   Outpatient Encounter Medications as of 02/12/2020  Medication Sig  . aspirin EC 81 MG tablet Take 81 mg by mouth daily.  . carbamazepine (TEGRETOL) 200 MG tablet Take 1 tablet (200 mg total) by mouth 2 (two) times daily.  Marland Kitchen CINNAMON PO Take 1,000 mg by mouth daily.  . fish oil-omega-3 fatty acids 1000 MG capsule Take 2 g by mouth daily.  Marland Kitchen glipiZIDE (GLUCOTROL XL) 2.5 MG 24 hr tablet TAKE 1 TABLET (2.5 MG TOTAL) BY MOUTH DAILY WITH BREAKFAST.  Marland Kitchen ibuprofen (ADVIL,MOTRIN) 200 MG tablet  Take 800 mg by mouth as needed for moderate pain.  Marland Kitchen insulin glargine, 1 Unit Dial, (TOUJEO SOLOSTAR) 300 UNIT/ML Solostar Pen Inject 10 Units into the skin at bedtime.  . Insulin Pen Needle (ULTICARE MINI PEN NEEDLES) 31G X 6 MM MISC USE AS DIRECTED AT BEDTIME  . Multiple Vitamin (MULTIVITAMIN) tablet Take 1 tablet by mouth daily.  . pravastatin (PRAVACHOL) 40 MG tablet TAKE 1 TABLET BY MOUTH EVERY DAY  . pregabalin (LYRICA) 75 MG capsule Take 1 capsule by mouth twice daily   No facility-administered encounter medications on file as of 02/12/2020.   ALLERGIES: Allergies  Allergen Reactions  . No Known Allergies    VACCINATION STATUS: Immunization History  Administered Date(s) Administered  . Fluad Quad(high Dose 65+) 12/30/2018, 02/05/2020  .  Influenza, High Dose Seasonal PF 02/07/2015, 02/01/2016, 01/02/2017, 01/24/2018, 01/24/2018  . Influenza-Unspecified 02/02/2014  . PFIZER SARS-COV-2 Vaccination 05/10/2019, 05/28/2019, 01/07/2020  . Pneumococcal Conjugate-13 04/07/2015  . Pneumococcal Polysaccharide-23 07/02/2016, 12/30/2018  . Tdap 08/23/2017  . Zoster 12/02/2012    Diabetes He presents for his follow-up diabetic visit. He has type 2 diabetes mellitus. Onset time: He was diagnosed at approximate age of 54 years. His disease course has been stable (He is diagnosed with essential tremors since last visit, following with neurology.). There are no hypoglycemic associated symptoms. Pertinent negatives for hypoglycemia include no confusion, headaches, pallor or seizures. There are no diabetic associated symptoms. Pertinent negatives for diabetes include no chest pain, no fatigue, no polydipsia, no polyphagia, no polyuria and no weakness. There are no hypoglycemic complications. Symptoms are stable. There are no diabetic complications. Risk factors for coronary artery disease include diabetes mellitus, dyslipidemia, hypertension, male sex and sedentary lifestyle. Current diabetic treatment includes insulin injections and oral agent (monotherapy). He is compliant with treatment most of the time. His weight is fluctuating minimally. He is following a generally unhealthy diet. When asked about meal planning, he reported none. He has had a previous visit with a dietitian. He participates in exercise intermittently. His home blood glucose trend is fluctuating minimally. His breakfast blood glucose range is generally 110-130 mg/dl. His bedtime blood glucose range is generally 140-180 mg/dl. His overall blood glucose range is 140-180 mg/dl. (He presents with controlled glycemic profile at fasting, near target postprandial readings.  His point-of-care A1c 7.5% remaining stable since last visit.  Has no significant hypoglycemia.  ) An ACE  inhibitor/angiotensin II receptor blocker is being taken.  Hyperlipidemia This is a chronic problem. The current episode started more than 1 year ago. The problem is controlled. Exacerbating diseases include diabetes. Pertinent negatives include no chest pain, myalgias or shortness of breath. Current antihyperlipidemic treatment includes statins. Risk factors for coronary artery disease include diabetes mellitus, dyslipidemia, hypertension, male sex and a sedentary lifestyle.  Hypertension This is a chronic problem. The current episode started more than 1 year ago. The problem is uncontrolled. Pertinent negatives include no chest pain, headaches, neck pain, palpitations or shortness of breath. Risk factors for coronary artery disease include dyslipidemia and diabetes mellitus. Past treatments include ACE inhibitors.    Review of systems  Constitutional: + Minimally fluctuating body weight,  current  Body mass index is 19.55 kg/m. , no fatigue, no subjective hyperthermia, no subjective hypothermia Eyes: no blurry vision, no xerophthalmia ENT: no sore throat, no nodules palpated in throat, no dysphagia/odynophagia, no hoarseness Cardiovascular: no Chest Pain, no Shortness of Breath, no palpitations, no leg swelling Respiratory: no cough, no shortness of breath Gastrointestinal: no Nausea/Vomiting/Diarhhea Musculoskeletal:  no muscle/joint aches Skin: no rashes, no hyperemia Neurological: no tremors, no numbness, no tingling, no dizziness Psychiatric: no depression, no anxiety   Objective:    BP 125/69   Pulse 80   Ht 6\' 1"  (1.854 m)   Wt 148 lb 3.2 oz (67.2 kg)   BMI 19.55 kg/m   Wt Readings from Last 3 Encounters:  02/12/20 148 lb 3.2 oz (67.2 kg)  02/05/20 147 lb (66.7 kg)  02/02/20 147 lb (66.7 kg)     Physical Exam- Limited  Constitutional:  Body mass index is 19.55 kg/m. , not in acute distress, normal state of mind Eyes:  EOMI, no exophthalmos Neck: Supple Thyroid: No  gross goiter Respiratory: Adequate breathing efforts Musculoskeletal: no gross deformities, strength intact in all four extremities, no gross restriction of joint movements Skin:  no rashes, no hyperemia Neurological: no tremor with outstretched hands    Results for orders placed or performed in visit on 02/12/20  HgB A1c  Result Value Ref Range   Hemoglobin A1C 7.5 (A) 4.0 - 5.6 %   HbA1c POC (<> result, manual entry)     HbA1c, POC (prediabetic range)     HbA1c, POC (controlled diabetic range)     Diabetic Labs (most recent): Lab Results  Component Value Date   HGBA1C 7.5 (A) 02/12/2020   HGBA1C 7.4 (A) 10/12/2019   HGBA1C 8.5 (A) 06/04/2019   Lipid Panel     Component Value Date/Time   CHOL 162 09/23/2018 0749   TRIG 66 09/23/2018 0749   HDL 47 09/23/2018 0749   CHOLHDL 3.4 09/23/2018 0749   VLDL 20 06/27/2015 0816   LDLCALC 100 (H) 09/23/2018 0749   CMP Latest Ref Rng & Units 10/06/2019 01/27/2019 09/23/2018  Glucose 65 - 99 mg/dL 100(H) 106(H) 137(H)  BUN 7 - 25 mg/dL 16 19 22   Creatinine 0.70 - 1.18 mg/dL 0.96 1.04 1.02  Sodium 135 - 146 mmol/L 135 139 139  Potassium 3.5 - 5.3 mmol/L 4.5 4.4 4.8  Chloride 98 - 110 mmol/L 100 103 104  CO2 20 - 32 mmol/L 28 31 30   Calcium 8.6 - 10.3 mg/dL 9.2 9.4 9.6  Total Protein 6.1 - 8.1 g/dL 6.2 6.3 6.4  Total Bilirubin 0.2 - 1.2 mg/dL 0.5 0.4 0.5  Alkaline Phos 38 - 126 U/L - - -  AST 10 - 35 U/L 14 14 15   ALT 9 - 46 U/L 14 16 18      Assessment & Plan:   1. Uncontrolled type 2 diabetes mellitus without complication, with long-term current use of insulin (HCC) -No gross complications from his diabetes so far, however patient remains at a high risk for more acute and chronic complications of diabetes which include CAD, CVA, CKD, retinopathy, and neuropathy. These are all discussed in detail with the patient.  He presents with controlled glycemic profile at fasting, near target postprandial readings.  His point-of-care A1c  7.5% remaining stable since last visit.  Has no significant hypoglycemia.      - I have re-counseled the patient on diet management  by adopting a carbohydrate restricted / protein rich  Diet.  -  Suggestion is made for him to avoid simple carbohydrates  from his diet including Cakes, Sweet Desserts / Pastries, Ice Cream, Soda (diet and regular), Sweet Tea, Candies, Chips, Cookies, Sweet Pastries,  Store Bought Juices, Alcohol in Excess of  1-2 drinks a day, Artificial Sweeteners, Coffee Creamer, and "Sugar-free" Products. This will help patient to have stable blood  glucose profile and potentially avoid unintended weight gain.   - Patient is advised to stick to a routine mealtimes to eat 3 meals  a day and avoid unnecessary snacks ( to snack only to correct hypoglycemia).  - I have approached patient with the following individualized plan to manage diabetes and patient agrees.  -It was previously determined that his c-peptide is critically low at 0.5, indicating pancreatic exhaustion.  He may very soon require intensive treatment with basal/bolus insulin in order for him to maintain control of diabetes to target.     -This patient has a propensity to fall, recently suspected of having vasovagal syncope.  Priority #1 would be to avoid hypoglycemia. -He will not tolerate any higher dose of basal insulin.  He is advised to continue Toujeo 10 units nightly, associated with monitoring of blood glucose at least twice a day daily before breakfast and at bedtime and at any other time as needed.   -He has benefited from low-dose glipizide to control postprandial glycemic profile.  He is advised to continue glipizide 2.5 mg XL p.o. daily at breakfast. -He is encouraged to call clinic if he is blood glucose readings are greater than 200 mg/dL x 3. - His GAD antibodies are negative, however does not rule out a possibility of LADA.   He is not a candidate for incretin therapy, SGLT2 inhibitor therapy. -  Patient specific target  for A1c; LDL, HDL, Triglycerides,  were discussed in detail.  2) BP/HTN:  -His blood pressure is controlled to target.   He is not currently on antihypertensive medication.  3) Lipids/HPL: His recent lipid panel showed uncontrolled LDL at 113.  He is advised to continue pravastatin 40 mg p.o. nightly.     Side effects and precautions discussed with him.     4)  Weight/Diet: He has steady weight lately.  He is not a candidate for weight loss.    exercise, and carbohydrates information provided.  5) Chronic Care/Health Maintenance:  -Patient is on ACEI/ARB and Statin medications and encouraged to continue to follow up with Ophthalmology, Podiatrist at least yearly or according to recommendations, and advised to  stay away from smoking. I have recommended yearly flu vaccine and pneumonia vaccination at least every 5 years; moderate intensity exercise for up to 150 minutes weekly; and  sleep for at least 7 hours a day. - He was recently diagnosed with essential tremors. I advised him to continue follow-up with neurology.   - I advised patient to maintain close follow up with [name], Fransisca Kaufmann, MD for primary care needs.  - Time spent on this patient care encounter:  35 min, of which > 50% was spent in  counseling and the rest reviewing his blood glucose logs , discussing his hypoglycemia and hyperglycemia episodes, reviewing his current and  previous labs / studies  ( including abstraction from other facilities) and medications  doses and developing a  long term treatment plan and documenting his care.   Please refer to Patient Instructions for Blood Glucose Monitoring and Insulin/Medications Dosing Guide"  in media tab for additional information. Please  also refer to " Patient Self Inventory" in the Media  tab for reviewed elements of pertinent patient history.  [employer] participated in the discussions, expressed understanding, and voiced agreement with the  above plans.  All questions were answered to his satisfaction. he is encouraged to contact clinic should he have any questions or concerns prior to his return visit.   Follow  up plan: -Return in about 6 months (around 08/12/2020) for ABI in Office [place], Bring Meter and Logs- A1c in Office, Urine MA - NV.  Glade Mykle, MD Phone: [phone]  Fax: [phone]  -  This note was partially dictated with voice recognition software. Similar sounding words can be transcribed inadequately or may not  be corrected upon review.  02/12/2020, 8:43 AM

## 2020-03-28 ENCOUNTER — Ambulatory Visit: Payer: PPO | Admitting: Family

## 2020-03-30 ENCOUNTER — Ambulatory Visit (INDEPENDENT_AMBULATORY_CARE_PROVIDER_SITE_OTHER): Payer: PPO | Admitting: Family Medicine

## 2020-03-30 ENCOUNTER — Encounter: Payer: Self-pay | Admitting: Family Medicine

## 2020-03-30 ENCOUNTER — Other Ambulatory Visit: Payer: Self-pay

## 2020-03-30 VITALS — BP 129/79 | HR 87 | Ht 73.0 in | Wt 145.0 lb

## 2020-03-30 DIAGNOSIS — M5431 Sciatica, right side: Secondary | ICD-10-CM | POA: Diagnosis not present

## 2020-03-30 MED ORDER — KETOROLAC TROMETHAMINE 60 MG/2ML IM SOLN
60.0000 mg | Freq: Once | INTRAMUSCULAR | Status: AC
  Administered 2020-03-30: 60 mg via INTRAMUSCULAR

## 2020-03-30 NOTE — Progress Notes (Signed)
BP 129/79   Pulse 87   Ht 6\' 1"  (1.854 m)   Wt 145 lb (65.8 kg)   SpO2 100%   BMI 19.13 kg/m    Subjective:   Patient ID: John Reese, male    DOB: 1951/03/26, 69 y.o.   MRN: [id number]  HPI: John Reese is a 69 y.o. male presenting on 03/30/2020 for Medical Management of Chronic Issues and Back Pain (radiates down buttocks and bilateral legs)   HPI Patient is coming in today complaining of bilateral back pain that is worse on the right side and goes down his right leg down into his buttock and down his right leg.  He feels a shooting pain.  He says is all started about 3 weeks ago when he was bending over to pick something up and felt the spasm in his back and has been hurting since then.  He has been taking Advil and Tylenol without much success for this.  Relevant past medical, surgical, family and social history reviewed and updated as indicated. Interim medical history since our last visit reviewed. Allergies and medications reviewed and updated.  Review of Systems  Constitutional: Negative for chills and fever.  Respiratory: Negative for shortness of breath and wheezing.   Cardiovascular: Negative for chest pain and leg swelling.  Musculoskeletal: Positive for arthralgias and back pain. Negative for gait problem.  Skin: Negative for rash.  All other systems reviewed and are negative.   Per HPI unless specifically indicated above   Allergies as of 03/30/2020      Reactions   No Known Allergies       Medication List       Accurate as of March 30, 2020  4:32 PM. If you have any questions, ask your nurse or doctor.        STOP taking these medications   carbamazepine 200 MG tablet Commonly known as: TEGRETOL Stopped by: Fransisca Kaufmann Derian Dimalanta, MD   pregabalin 75 MG capsule Commonly known as: LYRICA Stopped by: Fransisca Kaufmann Mone Commisso, MD     TAKE these medications   aspirin EC 81 MG tablet Take 81 mg by mouth daily.   CINNAMON PO Take 1,000 mg by mouth  daily.   fish oil-omega-3 fatty acids 1000 MG capsule Take 2 g by mouth daily.   glipiZIDE 2.5 MG 24 hr tablet Commonly known as: GLUCOTROL XL TAKE 1 TABLET (2.5 MG TOTAL) BY MOUTH DAILY WITH BREAKFAST.   ibuprofen 200 MG tablet Commonly known as: ADVIL Take 800 mg by mouth as needed for moderate pain.   multivitamin tablet Take 1 tablet by mouth daily.   pravastatin 40 MG tablet Commonly known as: PRAVACHOL TAKE 1 TABLET BY MOUTH EVERY DAY   Toujeo SoloStar 300 UNIT/ML Solostar Pen Generic drug: insulin glargine (1 Unit Dial) Inject 10 Units into the skin at bedtime.   UltiCare Mini Pen Needles 31G X 6 MM Misc Generic drug: Insulin Pen Needle USE AS DIRECTED AT BEDTIME        Objective:   BP 129/79   Pulse 87   Ht 6\' 1"  (1.854 m)   Wt 145 lb (65.8 kg)   SpO2 100%   BMI 19.13 kg/m   Wt Readings from Last 3 Encounters:  03/30/20 145 lb (65.8 kg)  02/12/20 148 lb 3.2 oz (67.2 kg)  02/05/20 147 lb (66.7 kg)    Physical Exam Vitals and nursing note reviewed.  Constitutional:      General: He is not  in acute distress.    Appearance: He is well-developed. He is not diaphoretic.  Neck:     Thyroid: No thyromegaly.  Musculoskeletal:        General: Normal range of motion.     Lumbar back: Spasms and tenderness present. No deformity or bony tenderness. Normal range of motion. Negative right straight leg raise test and negative left straight leg raise test.  Skin:    General: Skin is warm and dry.     Findings: No erythema or rash.  Neurological:     Mental Status: He is alert and oriented to person, place, and time.     Coordination: Coordination normal.  Psychiatric:        Behavior: Behavior normal.       Assessment & Plan:   Problem List Items Addressed This Visit    None    Visit Diagnoses    Right sciatic nerve pain    -  Primary   Relevant Medications   ketorolac (TORADOL) injection 60 mg (Start on 03/30/2020  4:45 PM)      Will give Toradol  injections. Follow up plan: Return in about 2 months (around 05/31/2020), or if symptoms worsen or fail to improve, for Diabetes.  Counseling provided for all of the vaccine components No orders of the defined types were placed in this encounter.   Caryl Pina, MD [facility] 03/30/2020, 4:32 PM

## 2020-04-14 ENCOUNTER — Other Ambulatory Visit: Payer: Self-pay | Admitting: Family Medicine

## 2020-04-14 DIAGNOSIS — E782 Mixed hyperlipidemia: Secondary | ICD-10-CM

## 2020-05-13 ENCOUNTER — Other Ambulatory Visit: Payer: Self-pay | Admitting: "Endocrinology

## 2020-05-20 DIAGNOSIS — M4316 Spondylolisthesis, lumbar region: Secondary | ICD-10-CM | POA: Diagnosis not present

## 2020-05-20 DIAGNOSIS — M5416 Radiculopathy, lumbar region: Secondary | ICD-10-CM | POA: Diagnosis not present

## 2020-05-20 DIAGNOSIS — M48061 Spinal stenosis, lumbar region without neurogenic claudication: Secondary | ICD-10-CM | POA: Diagnosis not present

## 2020-05-20 DIAGNOSIS — M5440 Lumbago with sciatica, unspecified side: Secondary | ICD-10-CM | POA: Diagnosis not present

## 2020-05-26 ENCOUNTER — Other Ambulatory Visit (HOSPITAL_COMMUNITY): Payer: Self-pay | Admitting: Neurosurgery

## 2020-05-26 DIAGNOSIS — M4316 Spondylolisthesis, lumbar region: Secondary | ICD-10-CM

## 2020-06-03 ENCOUNTER — Encounter: Payer: Self-pay | Admitting: *Deleted

## 2020-06-15 ENCOUNTER — Ambulatory Visit (HOSPITAL_COMMUNITY)
Admission: RE | Admit: 2020-06-15 | Discharge: 2020-06-15 | Disposition: A | Discharge status: Home / Self Care | Payer: PPO | Source: Ambulatory Visit | Attending: Neurosurgery | Admitting: Neurosurgery

## 2020-06-15 ENCOUNTER — Other Ambulatory Visit: Payer: Self-pay

## 2020-06-15 DIAGNOSIS — M5126 Other intervertebral disc displacement, lumbar region: Secondary | ICD-10-CM | POA: Diagnosis not present

## 2020-06-15 DIAGNOSIS — M5127 Other intervertebral disc displacement, lumbosacral region: Secondary | ICD-10-CM | POA: Diagnosis not present

## 2020-06-15 DIAGNOSIS — M4316 Spondylolisthesis, lumbar region: Secondary | ICD-10-CM | POA: Insufficient documentation

## 2020-06-22 DIAGNOSIS — M5416 Radiculopathy, lumbar region: Secondary | ICD-10-CM | POA: Diagnosis not present

## 2020-06-22 DIAGNOSIS — M5440 Lumbago with sciatica, unspecified side: Secondary | ICD-10-CM | POA: Diagnosis not present

## 2020-06-22 DIAGNOSIS — Z681 Body mass index (BMI) 19 or less, adult: Secondary | ICD-10-CM | POA: Diagnosis not present

## 2020-06-22 DIAGNOSIS — I10 Essential (primary) hypertension: Secondary | ICD-10-CM | POA: Diagnosis not present

## 2020-07-06 ENCOUNTER — Other Ambulatory Visit: Payer: Self-pay

## 2020-07-06 ENCOUNTER — Ambulatory Visit: Payer: PPO | Attending: Neurosurgery | Admitting: Physical Therapy

## 2020-07-06 DIAGNOSIS — M5441 Lumbago with sciatica, right side: Secondary | ICD-10-CM | POA: Diagnosis not present

## 2020-07-06 DIAGNOSIS — M5442 Lumbago with sciatica, left side: Secondary | ICD-10-CM | POA: Diagnosis not present

## 2020-07-06 DIAGNOSIS — G8929 Other chronic pain: Secondary | ICD-10-CM | POA: Insufficient documentation

## 2020-07-06 NOTE — Therapy (Signed)
[facility] [place], [place], [address] Phone: [phone]   Fax:  [phone]  Physical Therapy Evaluation  Patient Details  Name: GRANVIL DJORDJEVIC MRN: [id number] Date of Birth: [birth year] Referring Provider (PT): Erline Levine MD   Encounter Date: 07/06/2020   PT End of Session - 07/06/20 0839    Visit Number 1    Number of Visits 12    Date for PT Re-Evaluation 08/17/20    Authorization Type FOTO AT LEAST EVERY 5TH VISIT.  PROGRESS NOTE AT 10TH VISIT.  KX MODIFIER AFTER 15 VISITS.    PT Start Time 0809    PT Stop Time 0848    PT Time Calculation (min) 39 min    Activity Tolerance Patient tolerated treatment well    Behavior During Therapy WFL for tasks assessed/performed           Past Medical History:  Diagnosis Date  . Auditory disturbance    Decreased auditory acuity  . 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 Diabetes ([place])   . Dyslipidemia   . Tremor   . Trigeminal neuralgia    Right, V2 distribution    Past Surgical History:  Procedure Laterality Date  . FINGER AMPUTATION    . INNER EAR SURGERY Right    Ear drum repair  . PULSE GENERATOR IMPLANT Bilateral 03/30/2016   Procedure: BILATERAL PLACEMENT OF IMPLANTABLE PULSE GENERATOR;  Surgeon: Erline Levine, MD;  Location: [facility];  Service: Neurosurgery;  Laterality: Bilateral;  BILATERAL PLACEMENT OF IMPLANTABLE PULSE GENERATOR  . SUBTHALAMIC STIMULATOR INSERTION Bilateral 03/22/2016   Procedure: BILATERAL DEEP BRAIN STIMULATOR PLACEMENT WITH STARFIX WITH DR. Carles Collet;  Surgeon: Erline Levine, MD;  Location: [facility];  Service: Neurosurgery;  Laterality: Bilateral;    There were no vitals filed for this visit.    Subjective Assessment - 07/06/20 0855    Subjective COVID-19 screen performed prior to patient entering clinic.  The patient presents to the clinic with c/o bilateral hip, buttock and hamstring pain.  He states  that last fall after doing a lot of yardwork he experienced LBP.  Now, his CC is the aforementioned bilateral LE pain.  He states his wife has a TENS unit that he tried over his LB and hamstrings which was not helpful.  Told him to discontinue use of TENS.  Increased activity causes more pain.  Rest can help decrease pain some.    Pertinent History Deep Brain stimulator, DM, Pulse generator implantation, subthalamic stimulator insertion.    Diagnostic tests MRI, X-ray.    Currently in Pain? Yes    Pain Score 8     Pain Location Buttocks   Bilateral hamstrings.   Pain Orientation Right;Left    Pain Descriptors / Indicators Aching    Pain Type Chronic pain    Pain Onset More than a month ago    Pain Frequency Constant    Aggravating Factors  See above.    Pain Relieving Factors See above.              [facility] PT Assessment - 07/06/20 0001      Assessment   Medical Diagnosis Lumbago of lumbar region with scitica.    Referring Provider (PT) Erline Levine MD    Onset Date/Surgical Date --   Last Fall.     Precautions   Precaution Comments Deep brain stimulator.      Restrictions   Weight Bearing Restrictions No  Balance Screen   Has the patient fallen in the past 6 months No    Has the patient had a decrease in activity level because of a fear of falling?  No    Is the patient reluctant to leave their home because of a fear of falling?  No      Home Ec[employer] residence      Prior Function   Level of Independence Independent      Posture/Postural Control   Posture/Postural Control No significant limitations      Deep Tendon Reflexes   DTR Assessment Site --      ROM / Strength   AROM / PROM / Strength AROM;Strength      AROM   Overall AROM Comments Full active lumbar flexion and extension.      Strength   Overall Strength Comments Normal bilateral LE strength.      Palpation   Palpation comment Pain into bilateral buttocks and  hamdstrings though not palpably painful. Hisbilateral QL's were tender to palpation, left > right.      Special Tests   Other special tests Patient c/o pain with SLR testing though his hamstrings are tight.      Ambulation/Gait   Gait Comments Soem gait antalgia though patient reports no falls.                      Objective measurements completed on examination: See above findings.       [facility] Adult PT Treatment/Exercise - 07/06/20 0001      Modalities   Modalities Moist Heat      Moist Heat Therapy   Number Minutes Moist Heat 10 Minutes    Moist Heat Location Lumbar Spine                       PT Long Term Goals - 07/06/20 1444      PT LONG TERM GOAL #1   Title Independent with a HEP.    Time 6    Period Weeks    Status New      PT LONG TERM GOAL #2   Title Perform ADL's with no bilateral LE symptoms.    Time 6    Period Weeks    Status New                  Plan - 07/06/20 0854    Clinical Impression Statement The patient presents to OPPT with c/o bilateral buttock and hamstring pain.  Her reports an initial episode of low back pain following a great deal of yardwork.  He reports his low back is essentially gone though his LE pain continues.  His lumbar spine range of motion is WNL.  He has no LE strength deficits.  He was found, however, to be tender to palpation over his QL's.  Patient will benefit from skilled physical therapy intervention to address pain and deficits.    Personal Factors and Comorbidities Other;Comorbidity 1    Comorbidities Deep Brain stimulator, DM, Pulse generator implantation, subthalamic stimulator insertion.    Examination-Activity Limitations Other    Examination-Participation Restrictions Other    Stability/Clinical Decision Making Evolving/Moderate complexity    Clinical Decision Making Low    Rehab Potential Good    PT Frequency 2x / week    PT Duration 6 weeks    PT Treatment/Interventions  ADLs/Self Care Home Management;Cryotherapy;Moist Heat;Therapeutic activities;Therapeutic exercise;Manual techniques;Patient/family education;Passive range of motion;Dry  needling;Joint Manipulations;Spinal Manipulations    PT Next Visit Plan FOTO....Marland KitchenSTW/M to release QL's, core exercise progression.    Consulted and Agree with Plan of Care Patient           Patient will benefit from skilled therapeutic intervention in order to improve the following deficits and impairments:  Pain,Increased muscle spasms,Decreased activity tolerance  Visit Diagnosis: Chronic bilateral low back pain with bilateral sciatica - Plan: PT plan of care cert/re-cert     Problem List Patient Active Problem List   Diagnosis Date Noted  . Multiple fractures of ribs, bilateral, initial encounter for closed fracture 09/06/2017  . S/P deep brain stimulator placement 04/26/2016  . Essential tremor 04/13/2015  . Type 2 diabetes mellitus with other specified complication ([place]) 78/29/5621  . Essential hypertension 03/24/2014  . Mixed hyperlipidemia 03/24/2014  . Family history of coronary artery disease 03/24/2014  . Trigeminal neuralgia 03/24/2012    Emiah Pellicano, [place] MPT 07/06/2020, 2:47 PM  [facility] [address] [place], [place], [address] Phone: [phone]   Fax:  [phone]  Name: KIRON OSMUN MRN: [id number] Date of Birth: [birth year]

## 2020-07-11 ENCOUNTER — Other Ambulatory Visit: Payer: Self-pay | Admitting: "Endocrinology

## 2020-07-12 ENCOUNTER — Ambulatory Visit: Payer: PPO | Admitting: *Deleted

## 2020-07-12 ENCOUNTER — Other Ambulatory Visit: Payer: Self-pay

## 2020-07-12 DIAGNOSIS — M5442 Lumbago with sciatica, left side: Secondary | ICD-10-CM | POA: Diagnosis not present

## 2020-07-12 DIAGNOSIS — G8929 Other chronic pain: Secondary | ICD-10-CM

## 2020-07-12 NOTE — Therapy (Signed)
[facility] [place], [place], [address] Phone: [phone]   Fax:  [phone]  Physical Therapy Treatment  Patient Details  Name: John Reese MRN: [id number] Date of Birth: 1951/03/25 Referring Provider (PT): Erline Levine MD   Encounter Date: 07/12/2020   PT End of Session - 07/12/20 1059    Visit Number 2    Number of Visits 12    Date for PT Re-Evaluation 08/17/20    Authorization Type FOTO AT LEAST EVERY 5TH VISIT.  PROGRESS NOTE AT 10TH VISIT.  KX MODIFIER AFTER 15 VISITS.    PT Start Time 0815    PT Stop Time 0904    PT Time Calculation (min) 49 min    Activity Tolerance Patient tolerated treatment well    Behavior During Therapy [facility] for tasks assessed/performed           Past Medical History:  Diagnosis Date  . Auditory disturbance    Decreased auditory acuity  . 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 Diabetes ([place])   . Dyslipidemia   . Tremor   . Trigeminal neuralgia    Right, V2 distribution    Past Surgical History:  Procedure Laterality Date  . FINGER AMPUTATION    . INNER EAR SURGERY Right    Ear drum repair  . PULSE GENERATOR IMPLANT Bilateral 03/30/2016   Procedure: BILATERAL PLACEMENT OF IMPLANTABLE PULSE GENERATOR;  Surgeon: Erline Levine, MD;  Location: [facility];  Service: Neurosurgery;  Laterality: Bilateral;  BILATERAL PLACEMENT OF IMPLANTABLE PULSE GENERATOR  . SUBTHALAMIC STIMULATOR INSERTION Bilateral 03/22/2016   Procedure: BILATERAL DEEP BRAIN STIMULATOR PLACEMENT WITH STARFIX WITH DR. Carles Collet;  Surgeon: Erline Levine, MD;  Location: [facility];  Service: Neurosurgery;  Laterality: Bilateral;    There were no vitals filed for this visit.   Subjective Assessment - 07/12/20 0818    Subjective COVID-19 screen performed prior to patient entering clinic.    Pertinent History Deep Brain stimulator, DM, Pulse generator implantation, subthalamic stimulator  insertion.    Diagnostic tests MRI, X-ray.    Currently in Pain? Yes    Pain Score 6     Pain Location Buttocks    Pain Orientation Right;Left    Pain Onset More than a month ago                             [facility] Adult PT Treatment/Exercise - 07/12/20 0001      Therapeutic Activites    Therapeutic Activities ADL's    ADL's AB bracing with sit to stand x10 off mat and AB bracing with Log roll on/off mat table x5, discussed in/out of car      Exercises   Exercises Lumbar      Lumbar Exercises: Standing   Other Standing Lumbar Exercises wall plank x6 hold 10 secs both sides    Other Standing Lumbar Exercises standing at counter ab brace and hip extension x10      Lumbar Exercises: Supine   Ab Set 10 reps;5 seconds    Bent Knee Raise 10 reps    Other Supine Lumbar Exercises Mcgill curl up  x10      Lumbar Exercises: Sidelying   Other Sidelying Lumbar Exercises attempted side plank, but challenging      Lumbar Exercises: Quadruped   Madcat/Old Horse 10 reps    Single Arm Raise 10 reps    Straight Leg  Raise --   attempted, but challenging                      PT Long Term Goals - 07/06/20 1444      PT LONG TERM GOAL #1   Title Independent with a HEP.    Time 6    Period Weeks    Status New      PT LONG TERM GOAL #2   Title Perform ADL's with no bilateral LE symptoms.    Time 6    Period Weeks    Status New                 Plan - 07/12/20 1112    Clinical Impression Statement Pt arrived today reportin that his pain is the worst from sit to stand so Rx focused on neutral pain free spinal position and then performed AB bracing and trying to maintain that position for transitional movements. Core activation exs were performed and scaled back to patients tolerance and HEP was issued with handouts. Review HEP and body mechanics again next visit.    Comorbidities Deep Brain stimulator, DM, Pulse generator implantation, subthalamic  stimulator insertion.    Examination-Activity Limitations Other    Examination-Participation Restrictions Other    Stability/Clinical Decision Making Evolving/Moderate complexity    PT Frequency 2x / week    PT Treatment/Interventions ADLs/Self Care Home Management;Cryotherapy;Moist Heat;Therapeutic activities;Therapeutic exercise;Manual techniques;Patient/family education;Passive range of motion;Dry needling;Joint Manipulations;Spinal Manipulations    PT Next Visit Plan FOTO....Marland KitchenSTW/M to release QL's, core exercise progression.  Review core exs/HEP    Consulted and Agree with Plan of Care Patient           Patient will benefit from skilled therapeutic intervention in order to improve the following deficits and impairments:  Pain,Increased muscle spasms,Decreased activity tolerance  Visit Diagnosis: Chronic bilateral low back pain with bilateral sciatica     Problem List Patient Active Problem List   Diagnosis Date Noted  . Multiple fractures of ribs, bilateral, initial encounter for closed fracture 09/06/2017  . S/P deep brain stimulator placement 04/26/2016  . Essential tremor 04/13/2015  . Type 2 diabetes mellitus with other specified complication ([place]) 42/68/3419  . Essential hypertension 03/24/2014  . Mixed hyperlipidemia 03/24/2014  . Family history of coronary artery disease 03/24/2014  . Trigeminal neuralgia 03/24/2012    Aengus Sauceda,CHRIS , PTA 07/12/2020, 11:17 AM  [facility] [place], [place], [address] Phone: [phone]   Fax:  [phone]  Name: John Reese MRN: [id number] Date of Birth: 1950-07-23

## 2020-07-14 ENCOUNTER — Other Ambulatory Visit: Payer: Self-pay

## 2020-07-14 ENCOUNTER — Ambulatory Visit: Payer: PPO | Admitting: *Deleted

## 2020-07-14 DIAGNOSIS — G8929 Other chronic pain: Secondary | ICD-10-CM

## 2020-07-14 DIAGNOSIS — M5442 Lumbago with sciatica, left side: Secondary | ICD-10-CM | POA: Diagnosis not present

## 2020-07-14 DIAGNOSIS — M5441 Lumbago with sciatica, right side: Secondary | ICD-10-CM

## 2020-07-14 NOTE — Therapy (Signed)
[facility] [place], [place], [address] Phone: [phone]   Fax:  [phone]  Physical Therapy Treatment  Patient Details  Name: John Reese MRN: [id number] Date of Birth: January 20, 1951 Referring Provider (PT): Erline Levine MD   Encounter Date: 07/14/2020    Past Medical History:  Diagnosis Date  . Auditory disturbance    Decreased auditory acuity  . 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 Diabetes ([place])   . Dyslipidemia   . Tremor   . Trigeminal neuralgia    Right, V2 distribution    Past Surgical History:  Procedure Laterality Date  . FINGER AMPUTATION    . INNER EAR SURGERY Right    Ear drum repair  . PULSE GENERATOR IMPLANT Bilateral 03/30/2016   Procedure: BILATERAL PLACEMENT OF IMPLANTABLE PULSE GENERATOR;  Surgeon: Erline Levine, MD;  Location: [facility];  Service: Neurosurgery;  Laterality: Bilateral;  BILATERAL PLACEMENT OF IMPLANTABLE PULSE GENERATOR  . SUBTHALAMIC STIMULATOR INSERTION Bilateral 03/22/2016   Procedure: BILATERAL DEEP BRAIN STIMULATOR PLACEMENT WITH STARFIX WITH DR. Carles Collet;  Surgeon: Erline Levine, MD;  Location: [facility];  Service: Neurosurgery;  Laterality: Bilateral;    There were no vitals filed for this visit.   Subjective Assessment - 07/14/20 0819    Subjective COVID-19 screen performed prior to patient entering clinic. Did  ok with EXs just sore    Pertinent History Deep Brain stimulator, DM, Pulse generator implantation, subthalamic stimulator insertion.    Diagnostic tests MRI, X-ray.    Currently in Pain? Yes    Pain Score 6     Pain Location Buttocks    Pain Orientation Right;Left    Pain Descriptors / Indicators Aching    Pain Type Chronic pain    Pain Onset More than a month ago                             [facility] Adult PT Treatment/Exercise - 07/14/20 0001      Lumbar Exercises: Aerobic   UBE (Upper Arm Bike) x 6  mins 120 RPMs      Lumbar Exercises: Standing   Other Standing Lumbar Exercises wall plank x6 hold 10 secs both sides    Other Standing Lumbar Exercises standing at counter ab brace and hip extension x10 hold 5 secs each      Lumbar Exercises: Seated   Sit to Stand 10 reps   with AB bracing and ginge bending     Lumbar Exercises: Supine   Ab Set 10 reps;5 seconds    Bent Knee Raise 10 reps;5 seconds    Other Supine Lumbar Exercises Mcgill curl up  x10  hold 5 secs each      Lumbar Exercises: Sidelying   Hip Abduction Both   with curl x 6 hold 10 secs each     Lumbar Exercises: Quadruped   Madcat/Old Horse 10 reps                       PT Long Term Goals - 07/06/20 1444      PT LONG TERM GOAL #1   Title Independent with a HEP.    Time 6    Period Weeks    Status New      PT LONG TERM GOAL #2   Title Perform ADL's with no bilateral LE symptoms.    Time 6  Period Weeks    Status New                 Plan - 07/14/20 0853    Clinical Impression Statement Pt arrived today doing good from last Rx with mainly soreness. Rx focused again on core activation and strengthening exs and functional movement patterns that don't trigger his pain.    Personal Factors and Comorbidities Other;Comorbidity 1    Comorbidities Deep Brain stimulator, DM, Pulse generator implantation, subthalamic stimulator insertion.    Examination-Participation Restrictions Other    Stability/Clinical Decision Making Evolving/Moderate complexity    Rehab Potential Good    PT Frequency 2x / week    PT Duration 6 weeks    PT Treatment/Interventions ADLs/Self Care Home Management;Cryotherapy;Moist Heat;Therapeutic activities;Therapeutic exercise;Manual techniques;Patient/family education;Passive range of motion;Dry needling;Joint Manipulations;Spinal Manipulations    PT Next Visit Plan FOTO....Marland KitchenSTW/M to release QL's, core exercise progression.  Review core exs/HEP add XTS blue    Consulted  and Agree with Plan of Care Patient           Patient will benefit from skilled therapeutic intervention in order to improve the following deficits and impairments:  Pain,Increased muscle spasms,Decreased activity tolerance  Visit Diagnosis: Chronic bilateral low back pain with bilateral sciatica     Problem List Patient Active Problem List   Diagnosis Date Noted  . Multiple fractures of ribs, bilateral, initial encounter for closed fracture 09/06/2017  . S/P deep brain stimulator placement 04/26/2016  . Essential tremor 04/13/2015  . Type 2 diabetes mellitus with other specified complication ([place]) 84/66/5993  . Essential hypertension 03/24/2014  . Mixed hyperlipidemia 03/24/2014  . Family history of coronary artery disease 03/24/2014  . Trigeminal neuralgia 03/24/2012    Debra Colon,CHRIS, PTA 07/14/2020, 2:28 PM  [facility] [place], [place], [address] Phone: [phone]   Fax:  [phone]  Name: John Reese MRN: [id number] Date of Birth: 10/25/1950

## 2020-07-18 ENCOUNTER — Other Ambulatory Visit: Payer: Self-pay

## 2020-07-18 ENCOUNTER — Ambulatory Visit: Payer: PPO | Admitting: Physical Therapy

## 2020-07-18 ENCOUNTER — Encounter: Payer: Self-pay | Admitting: Physical Therapy

## 2020-07-18 DIAGNOSIS — G8929 Other chronic pain: Secondary | ICD-10-CM

## 2020-07-18 DIAGNOSIS — M5442 Lumbago with sciatica, left side: Secondary | ICD-10-CM

## 2020-07-18 NOTE — Therapy (Signed)
[facility] [place], [place], [address] Phone: [phone]   Fax:  [phone]  Physical Therapy Treatment  Patient Details  Name: John Reese MRN: [id number] Date of Birth: Aug 16, 1950 Referring Provider (PT): Erline Levine MD   Encounter Date: 07/18/2020   PT End of Session - 07/18/20 0820    Visit Number 3    Number of Visits 12    Date for PT Re-Evaluation 08/17/20    Authorization Type FOTO AT LEAST EVERY 5TH VISIT.  PROGRESS NOTE AT 10TH VISIT.  KX MODIFIER AFTER 15 VISITS.    PT Start Time [phone]    PT Stop Time 0859    PT Time Calculation (min) 42 min    Activity Tolerance Patient tolerated treatment well    Behavior During Therapy John Reese for tasks assessed/performed           Past Medical History:  Diagnosis Date  . Auditory disturbance    Decreased auditory acuity  . 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 Diabetes ([place])   . Dyslipidemia   . Tremor   . Trigeminal neuralgia    Right, V2 distribution    Past Surgical History:  Procedure Laterality Date  . FINGER AMPUTATION    . INNER EAR SURGERY Right    Ear drum repair  . PULSE GENERATOR IMPLANT Bilateral 03/30/2016   Procedure: BILATERAL PLACEMENT OF IMPLANTABLE PULSE GENERATOR;  Surgeon: Erline Levine, MD;  Location: [facility];  Service: Neurosurgery;  Laterality: Bilateral;  BILATERAL PLACEMENT OF IMPLANTABLE PULSE GENERATOR  . SUBTHALAMIC STIMULATOR INSERTION Bilateral 03/22/2016   Procedure: BILATERAL DEEP BRAIN STIMULATOR PLACEMENT WITH STARFIX WITH DR. Carles Collet;  Surgeon: Erline Levine, MD;  Location: [facility];  Service: Neurosurgery;  Laterality: Bilateral;    There were no vitals filed for this visit.   Subjective Assessment - 07/18/20 0812    Subjective COVID-19 screen performed prior to patient entering clinic. Reports he thinks his pain is some better but still into his legs some.    Pertinent History Deep  Brain stimulator, DM, Pulse generator implantation, subthalamic stimulator insertion.    Diagnostic tests MRI, X-ray.    Currently in Pain? Yes    Pain Score 5     Pain Location Back    Pain Orientation Right;Left    Pain Descriptors / Indicators Discomfort    Pain Type Chronic pain    Pain Onset More than a month ago    Pain Frequency Constant              [facility] PT Assessment - 07/18/20 0001      Assessment   Medical Diagnosis Lumbago of lumbar region with scitica.    Referring Provider (PT) Erline Levine MD    Next MD Visit "2 month if not better"      Precautions   Precaution Comments Deep brain stimulator.      Restrictions   Weight Bearing Restrictions No                         [facility] Adult PT Treatment/Exercise - 07/18/20 0001      Lumbar Exercises: Aerobic   UBE (Upper Arm Bike) 90 RPM x8 min (forward/backward)      Lumbar Exercises: Standing   Functional Squats 15 reps;2 seconds    Functional Squats Limitations with ab bracing    Other Standing Lumbar Exercises wall plank x6 hold 10 secs both sides;  wll pushup x15 reps    Other Standing Lumbar Exercises Standing ab brace with hip ext x15 reps 2 sec holds      Lumbar Exercises: Seated   Sit to Stand 15 reps    Other Seated Lumbar Exercises B oblique core press x15 reps 5 sec      Lumbar Exercises: Supine   Ab Set 10 reps;5 seconds    Glut Set 10 reps;5 seconds    Bent Knee Raise 15 reps;5 seconds    Dead Bug 10 reps;5 seconds    Other Supine Lumbar Exercises Mcgill curl up  x10  hold 5 secs each      Lumbar Exercises: Sidelying   Clam Both;20 reps;2 seconds      Lumbar Exercises: Prone   Other Prone Lumbar Exercises POE x2 min      Lumbar Exercises: Quadruped   Madcat/Old Horse 15 reps                       PT Long Term Goals - 07/06/20 1444      PT LONG TERM GOAL #1   Title Independent with a HEP.    Time 6    Period Weeks    Status New      PT LONG TERM GOAL #2    Title Perform ADL's with no bilateral LE symptoms.    Time 6    Period Weeks    Status New                 Plan - 07/18/20 0909    Clinical Impression Statement Patient presented in clinic with pain still primarily in B LBP and radicular pain into his lateral hips. Patient progressed through core/hip strengthening with no reports of exaggerated pain or radicular symptoms. Patient required greater multimodal cueing to correct QP leg raise for pelvis instability.    Personal Factors and Comorbidities Other;Comorbidity 1    Comorbidities Deep Brain stimulator, DM, Pulse generator implantation, subthalamic stimulator insertion.    Examination-Activity Limitations Other    Examination-Participation Restrictions Other    Stability/Clinical Decision Making Evolving/Moderate complexity    Rehab Potential Good    PT Frequency 2x / week    PT Duration 6 weeks    PT Treatment/Interventions ADLs/Self Care Home Management;Cryotherapy;Moist Heat;Therapeutic activities;Therapeutic exercise;Manual techniques;Patient/family education;Passive range of motion;Dry needling;Joint Manipulations;Spinal Manipulations    PT Next Visit Plan FOTO....Marland KitchenSTW/M to release QL's, core exercise progression.  Review core exs/HEP add XTS blue    Consulted and Agree with Plan of Care Patient           Patient will benefit from skilled therapeutic intervention in order to improve the following deficits and impairments:  Pain,Increased muscle spasms,Decreased activity tolerance  Visit Diagnosis: Chronic bilateral low back pain with bilateral sciatica     Problem List Patient Active Problem List   Diagnosis Date Noted  . Multiple fractures of ribs, bilateral, initial encounter for closed fracture 09/06/2017  . S/P deep brain stimulator placement 04/26/2016  . Essential tremor 04/13/2015  . Type 2 diabetes mellitus with other specified complication ([place]) 85/05/7739  . Essential hypertension 03/24/2014  .  Mixed hyperlipidemia 03/24/2014  . Family history of coronary artery disease 03/24/2014  . Trigeminal neuralgia 03/24/2012    Standley Brooking, PTA 07/18/2020, 9:12 AM  [facility] [address] [place], [place], [address] Phone: [phone]   Fax:  [phone]  Name: John Reese MRN: [id number] Date of Birth: 05/12/1950

## 2020-07-20 ENCOUNTER — Ambulatory Visit: Payer: PPO | Admitting: Physical Therapy

## 2020-07-20 ENCOUNTER — Other Ambulatory Visit: Payer: Self-pay

## 2020-07-20 ENCOUNTER — Encounter: Payer: Self-pay | Admitting: Physical Therapy

## 2020-07-20 DIAGNOSIS — G8929 Other chronic pain: Secondary | ICD-10-CM

## 2020-07-20 DIAGNOSIS — M5442 Lumbago with sciatica, left side: Secondary | ICD-10-CM | POA: Diagnosis not present

## 2020-07-20 DIAGNOSIS — M5441 Lumbago with sciatica, right side: Secondary | ICD-10-CM

## 2020-07-20 NOTE — Therapy (Signed)
[facility] [place], [place], [address] Phone: [phone]   Fax:  [phone]  Physical Therapy Treatment  Patient Details  Name: John Reese MRN: [id number] Date of Birth: [birth year] Referring Provider (PT): Erline Levine MD   Encounter Date: 07/20/2020   PT End of Session - 07/20/20 0806    Visit Number 4    Number of Visits 12    Date for PT Re-Evaluation 08/17/20    Authorization Type FOTO AT LEAST EVERY 5TH VISIT.  PROGRESS NOTE AT 10TH VISIT.  KX MODIFIER AFTER 15 VISITS.    PT Start Time [phone]    PT Stop Time 0858    PT Time Calculation (min) 42 min    Activity Tolerance Patient tolerated treatment well    Behavior During Therapy [facility] for tasks assessed/performed           Past Medical History:  Diagnosis Date  . Auditory disturbance    Decreased auditory acuity  . 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 Diabetes ([place])   . Dyslipidemia   . Tremor   . Trigeminal neuralgia    Right, V2 distribution    Past Surgical History:  Procedure Laterality Date  . FINGER AMPUTATION    . INNER EAR SURGERY Right    Ear drum repair  . PULSE GENERATOR IMPLANT Bilateral 03/30/2016   Procedure: BILATERAL PLACEMENT OF IMPLANTABLE PULSE GENERATOR;  Surgeon: Erline Levine, MD;  Location: [place];  Service: Neurosurgery;  Laterality: Bilateral;  BILATERAL PLACEMENT OF IMPLANTABLE PULSE GENERATOR  . SUBTHALAMIC STIMULATOR INSERTION Bilateral 03/22/2016   Procedure: BILATERAL DEEP BRAIN STIMULATOR PLACEMENT WITH STARFIX WITH DR. Carles Collet;  Surgeon: Erline Levine, MD;  Location: [place];  Service: Neurosurgery;  Laterality: Bilateral;    There were no vitals filed for this visit.   Subjective Assessment - 07/20/20 0805    Subjective COVID-19 screen performed prior to patient entering clinic. Reports he thinks his pain is some better but still into his legs some.    Pertinent History Deep  Brain stimulator, DM, Pulse generator implantation, subthalamic stimulator insertion.    Diagnostic tests MRI, X-ray.    Currently in Pain? No/denies              [facility] PT Assessment - 07/20/20 0001      Assessment   Medical Diagnosis Lumbago of lumbar region with scitica.    Referring Provider (PT) Erline Levine MD    Next MD Visit "2 month if not better"      Precautions   Precaution Comments Deep brain stimulator.                         [place] Adult PT Treatment/Exercise - 07/20/20 0001      Lumbar Exercises: Aerobic   UBE (Upper Arm Bike) 90 RPM x8 min (forward/backward)      Lumbar Exercises: Standing   Functional Squats 20 reps;2 seconds    Functional Squats Limitations with ab bracing    Forward Lunge 15 reps;2 seconds   4# reachouts   Other Standing Lumbar Exercises B chop/lift blue XTS x15 reps    Other Standing Lumbar Exercises Standing ab brace with hip ext, abduct x20 reps 2 sec holds      Lumbar Exercises: Seated   Sit to Stand 15 reps   holding ball for posture     Lumbar Exercises: Supine   Glut Set 15 reps;5  seconds    Bent Knee Raise 20 reps;3 seconds    Dead Bug 20 reps;2 seconds    Bridge 15 reps;3 seconds    Other Supine Lumbar Exercises Mcgill curl up  x10  hold 5 secs each      Lumbar Exercises: Sidelying   Clam Both;20 reps;2 seconds    Clam Limitations red theraband                       PT Long Term Goals - 07/06/20 1444      PT LONG TERM GOAL #1   Title Independent with a HEP.    Time 6    Period Weeks    Status New      PT LONG TERM GOAL #2   Title Perform ADL's with no bilateral LE symptoms.    Time 6    Period Weeks    Status New                 Plan - 07/20/20 9622    Clinical Impression Statement Patient presented in clinic with no current LBP and only nerve irritation to proximal HS. Patient able to progress to more standing, functional therex without complaint of pain. Patient only  reported LBP during mcgill curl up minimally. Abdominal bracing instructed throughout therex.    Personal Factors and Comorbidities Other;Comorbidity 1    Comorbidities Deep Brain stimulator, DM, Pulse generator implantation, subthalamic stimulator insertion.    Examination-Activity Limitations Other    Examination-Participation Restrictions Other    Stability/Clinical Decision Making Evolving/Moderate complexity    Rehab Potential Good    PT Frequency 2x / week    PT Duration 6 weeks    PT Treatment/Interventions ADLs/Self Care Home Management;Cryotherapy;Moist Heat;Therapeutic activities;Therapeutic exercise;Manual techniques;Patient/family education;Passive range of motion;Dry needling;Joint Manipulations;Spinal Manipulations    PT Next Visit Plan FOTO....Marland KitchenSTW/M to release QL's, core exercise progression.  Review core exs/HEP add XTS blue    Consulted and Agree with Plan of Care Patient           Patient will benefit from skilled therapeutic intervention in order to improve the following deficits and impairments:  Pain,Increased muscle spasms,Decreased activity tolerance  Visit Diagnosis: Chronic bilateral low back pain with bilateral sciatica     Problem List Patient Active Problem List   Diagnosis Date Noted  . Multiple fractures of ribs, bilateral, initial encounter for closed fracture 09/06/2017  . S/P deep brain stimulator placement 04/26/2016  . Essential tremor 04/13/2015  . Type 2 diabetes mellitus with other specified complication ([place]) 29/79/8921  . Essential hypertension 03/24/2014  . Mixed hyperlipidemia 03/24/2014  . Family history of coronary artery disease 03/24/2014  . Trigeminal neuralgia 03/24/2012    Standley Brooking, PTA 07/20/2020, 9:10 AM  [facility] [address] [place], [place], [address] Phone: [phone]   Fax:  [phone]  Name: John Reese MRN: [id number] Date of Birth: 09-13-1950

## 2020-07-25 ENCOUNTER — Other Ambulatory Visit: Payer: Self-pay

## 2020-07-25 ENCOUNTER — Ambulatory Visit: Payer: PPO | Attending: Neurosurgery | Admitting: Physical Therapy

## 2020-07-25 DIAGNOSIS — M5442 Lumbago with sciatica, left side: Secondary | ICD-10-CM | POA: Diagnosis not present

## 2020-07-25 DIAGNOSIS — G8929 Other chronic pain: Secondary | ICD-10-CM | POA: Insufficient documentation

## 2020-07-25 DIAGNOSIS — M5441 Lumbago with sciatica, right side: Secondary | ICD-10-CM | POA: Diagnosis not present

## 2020-07-25 NOTE — Therapy (Signed)
[facility] [place], [place], [address] Phone: [phone]   Fax:  [phone]  Physical Therapy Treatment  Patient Details  Name: John Reese MRN: [id number] Date of Birth: 11/13/1950 Referring Provider (PT): Erline Levine MD   Encounter Date: 07/25/2020   PT End of Session - 07/25/20 0817    Visit Number 5    Number of Visits 12    Date for PT Re-Evaluation 08/17/20    Authorization Type FOTO 5th visit score 52.  PROGRESS NOTE AT 10TH VISIT.  KX MODIFIER AFTER 15 VISITS.    PT Start Time 0815    PT Stop Time 0858    PT Time Calculation (min) 43 min    Activity Tolerance Patient tolerated treatment well    Behavior During Therapy [facility] for tasks assessed/performed           Past Medical History:  Diagnosis Date  . Auditory disturbance    Decreased auditory acuity  . 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 Diabetes ([place])   . Dyslipidemia   . Tremor   . Trigeminal neuralgia    Right, V2 distribution    Past Surgical History:  Procedure Laterality Date  . FINGER AMPUTATION    . INNER EAR SURGERY Right    Ear drum repair  . PULSE GENERATOR IMPLANT Bilateral 03/30/2016   Procedure: BILATERAL PLACEMENT OF IMPLANTABLE PULSE GENERATOR;  Surgeon: Erline Levine, MD;  Location: [place];  Service: Neurosurgery;  Laterality: Bilateral;  BILATERAL PLACEMENT OF IMPLANTABLE PULSE GENERATOR  . SUBTHALAMIC STIMULATOR INSERTION Bilateral 03/22/2016   Procedure: BILATERAL DEEP BRAIN STIMULATOR PLACEMENT WITH STARFIX WITH DR. Carles Collet;  Surgeon: Erline Levine, MD;  Location: [place];  Service: Neurosurgery;  Laterality: Bilateral;    There were no vitals filed for this visit.   Subjective Assessment - 07/25/20 0814    Subjective COVID-19 screen performed prior to patient entering clinic. Patient arrived with n new complaints and doing well overall.    Pertinent History Deep Brain stimulator,  DM, Pulse generator implantation, subthalamic stimulator insertion.    Diagnostic tests MRI, X-ray.    Currently in Pain? No/denies                             [facility] Adult PT Treatment/Exercise - 07/25/20 0001      Lumbar Exercises: Aerobic   UBE (Upper Arm Bike) 90 RPM x8 min (forward/backward)    Other Aerobic Exercise 4# step outs and diagnols 2x10 each      Lumbar Exercises: Standing   Shoulder Extension Strengthening;Both;20 reps    Shoulder Extension Limitations Blue XTS    Other Standing Lumbar Exercises B chop/lift blue XTS x15 reps    Other Standing Lumbar Exercises Standing ab brace with hip ext, abduct x20 reps 2 sec holds      Lumbar Exercises: Supine   Bent Knee Raise 20 reps;3 seconds   with bracing   Bridge with clamshell 20 reps    Bridge with [employer] Limitations red band    Straight Leg Raise 2 seconds   2x10   Other Supine Lumbar Exercises Mcgill curl up  x10  hold 5 secs each      Lumbar Exercises: Sidelying   Hip Abduction Both;20 reps      Lumbar Exercises: Quadruped   Other Quadruped Lumbar Exercises opposite LE with bracing x10 each LE  PT Long Term Goals - 07/25/20 0818      PT LONG TERM GOAL #1   Title Independent with a HEP.    Baseline Patient reported doing HEP daily 07/25/20    Time 6    Period Weeks    Status Achieved      PT LONG TERM GOAL #2   Title Perform ADL's with no bilateral LE symptoms.    Baseline Patient reported less yet "some" ongoing symptoms 07/25/20    Time 6    Period Weeks    Status On-going                 Plan - 07/25/20 0850    Clinical Impression Statement Patient tolerated treatment well today. Patient able to progress with core exercises today with good technique and no increased pain. Patient has good understanding of HEP per patient reported and doing daily. Patient feels overall less pain and only minimal symptoms in LE. Patient met LTG #1 today with  remaining goal progressing.    Personal Factors and Comorbidities Other;Comorbidity 1    Comorbidities Deep Brain stimulator, DM, Pulse generator implantation, subthalamic stimulator insertion.    Examination-Activity Limitations Other    Examination-Participation Restrictions Other    Stability/Clinical Decision Making Evolving/Moderate complexity    Rehab Potential Good    PT Frequency 2x / week    PT Duration 6 weeks    PT Treatment/Interventions ADLs/Self Care Home Management;Cryotherapy;Moist Heat;Therapeutic activities;Therapeutic exercise;Manual techniques;Patient/family education;Passive range of motion;Dry needling;Joint Manipulations;Spinal Manipulations    PT Next Visit Plan cont with core exercise progression    Consulted and Agree with Plan of Care Patient           Patient will benefit from skilled therapeutic intervention in order to improve the following deficits and impairments:  Pain,Increased muscle spasms,Decreased activity tolerance  Visit Diagnosis: Chronic bilateral low back pain with bilateral sciatica     Problem List Patient Active Problem List   Diagnosis Date Noted  . Multiple fractures of ribs, bilateral, initial encounter for closed fracture 09/06/2017  . S/P deep brain stimulator placement 04/26/2016  . Essential tremor 04/13/2015  . Type 2 diabetes mellitus with other specified complication ([place]) 53/79/4327  . Essential hypertension 03/24/2014  . Mixed hyperlipidemia 03/24/2014  . Family history of coronary artery disease 03/24/2014  . Trigeminal neuralgia 03/24/2012    Phillips Climes, PTA 07/25/2020, 9:00 AM  [facility] [place], [place], [address] Phone: [phone]   Fax:  [phone]  Name: John Reese MRN: [id number] Date of Birth: 09/14/1950

## 2020-07-27 ENCOUNTER — Other Ambulatory Visit: Payer: Self-pay

## 2020-07-27 ENCOUNTER — Ambulatory Visit: Payer: PPO | Admitting: Physical Therapy

## 2020-07-27 ENCOUNTER — Encounter: Payer: Self-pay | Admitting: Physical Therapy

## 2020-07-27 DIAGNOSIS — M5442 Lumbago with sciatica, left side: Secondary | ICD-10-CM

## 2020-07-27 DIAGNOSIS — G8929 Other chronic pain: Secondary | ICD-10-CM

## 2020-07-27 NOTE — Therapy (Signed)
[facility] [place], [place], [address] Phone: [phone]   Fax:  [phone]  Physical Therapy Treatment  Patient Details  Name: John Reese MRN: [id number] Date of Birth: 06-30-1950 Referring Provider (PT): Erline Levine MD   Encounter Date: 07/27/2020   PT End of Session - 07/27/20 0825    Visit Number 6    Number of Visits 12    Date for PT Re-Evaluation 08/17/20    Authorization Type FOTO 5th visit score 52.  PROGRESS NOTE AT 10TH VISIT.  KX MODIFIER AFTER 15 VISITS.    PT Start Time [phone]    PT Stop Time 0858    PT Time Calculation (min) 42 min    Activity Tolerance Patient tolerated treatment well    Behavior During Therapy [facility] for tasks assessed/performed           Past Medical History:  Diagnosis Date  . Auditory disturbance    Decreased auditory acuity  . 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 Diabetes ([place])   . Dyslipidemia   . Tremor   . Trigeminal neuralgia    Right, V2 distribution    Past Surgical History:  Procedure Laterality Date  . FINGER AMPUTATION    . INNER EAR SURGERY Right    Ear drum repair  . PULSE GENERATOR IMPLANT Bilateral 03/30/2016   Procedure: BILATERAL PLACEMENT OF IMPLANTABLE PULSE GENERATOR;  Surgeon: Erline Levine, MD;  Location: [place];  Service: Neurosurgery;  Laterality: Bilateral;  BILATERAL PLACEMENT OF IMPLANTABLE PULSE GENERATOR  . SUBTHALAMIC STIMULATOR INSERTION Bilateral 03/22/2016   Procedure: BILATERAL DEEP BRAIN STIMULATOR PLACEMENT WITH STARFIX WITH DR. Carles Collet;  Surgeon: Erline Levine, MD;  Location: [place];  Service: Neurosurgery;  Laterality: Bilateral;    There were no vitals filed for this visit.   Subjective Assessment - 07/27/20 0816    Subjective COVID-19 screen performed prior to patient entering clinic. Patient arrived feeling fairly well but still having discomfort in posterior thighs.    Pertinent History  Deep Brain stimulator, DM, Pulse generator implantation, subthalamic stimulator insertion.    Diagnostic tests MRI, X-ray.    Currently in Pain? No/denies              [facility] PT Assessment - 07/27/20 0001      Assessment   Medical Diagnosis Lumbago of lumbar region with scitica.    Referring Provider (PT) Erline Levine MD    Next MD Visit "2 month if not better"      Precautions   Precaution Comments Deep brain stimulator.      Restrictions   Weight Bearing Restrictions No                         [facility] Adult PT Treatment/Exercise - 07/27/20 0001      Lumbar Exercises: Stretches   Passive Hamstring Stretch Right;Left;2 reps;30 seconds    Figure 4 Stretch 2 reps;30 seconds;Seated    Figure 4 Stretch Limitations with trunk flexion      Lumbar Exercises: Aerobic   UBE (Upper Arm Bike) 90 RPM x8 min (forward/backward)      Lumbar Exercises: Standing   Shoulder Extension Strengthening;Both;20 reps    Shoulder Extension Limitations Blue XTS    Other Standing Lumbar Exercises B chop/lift blue XTS x20 reps; rockerboard x3 min    Other Standing Lumbar Exercises standing core punch with blue XTS x10 reps  Lumbar Exercises: Supine   Bridge with clamshell 20 reps    Bridge with [employer] Limitations green theraband      Lumbar Exercises: Sidelying   Hip Abduction Both;20 reps      Lumbar Exercises: Prone   Straight Leg Raise 20 reps    Other Prone Lumbar Exercises Prone pressup x15 reps                       PT Long Term Goals - 07/25/20 0818      PT LONG TERM GOAL #1   Title Independent with a HEP.    Baseline Patient reported doing HEP daily 07/25/20    Time 6    Period Weeks    Status Achieved      PT LONG TERM GOAL #2   Title Perform ADL's with no bilateral LE symptoms.    Baseline Patient reported less yet "some" ongoing symptoms 07/25/20    Time 6    Period Weeks    Status On-going                 Plan - 07/27/20 4098     Clinical Impression Statement Patient presented in clinic with reports of discomfort in posterior thighs. Patient progressed through more LE stretching with a good stretch sensation noted throughout stretches. More posterior/extension therex completed in supine and prone. Patient report report "feeling it" with prone pressups. No complaints following end of treatment.    Personal Factors and Comorbidities Other;Comorbidity 1    Comorbidities Deep Brain stimulator, DM, Pulse generator implantation, subthalamic stimulator insertion.    Examination-Activity Limitations Other    Examination-Participation Restrictions Other    Stability/Clinical Decision Making Evolving/Moderate complexity    Rehab Potential Good    PT Frequency 2x / week    PT Duration 6 weeks    PT Treatment/Interventions ADLs/Self Care Home Management;Cryotherapy;Moist Heat;Therapeutic activities;Therapeutic exercise;Manual techniques;Patient/family education;Passive range of motion;Dry needling;Joint Manipulations;Spinal Manipulations    PT Next Visit Plan cont with core exercise progression    Consulted and Agree with Plan of Care Patient           Patient will benefit from skilled therapeutic intervention in order to improve the following deficits and impairments:  Pain,Increased muscle spasms,Decreased activity tolerance  Visit Diagnosis: Chronic bilateral low back pain with bilateral sciatica     Problem List Patient Active Problem List   Diagnosis Date Noted  . Multiple fractures of ribs, bilateral, initial encounter for closed fracture 09/06/2017  . S/P deep brain stimulator placement 04/26/2016  . Essential tremor 04/13/2015  . Type 2 diabetes mellitus with other specified complication ([place]) 11/91/4782  . Essential hypertension 03/24/2014  . Mixed hyperlipidemia 03/24/2014  . Family history of coronary artery disease 03/24/2014  . Trigeminal neuralgia 03/24/2012    Standley Brooking, PTA 07/27/2020, 9:09  AM  [facility] [address] [place], [place], [address] Phone: [phone]   Fax:  [phone]  Name: JAVARIOUS ELSAYED MRN: [id number] Date of Birth: 01/12/1951

## 2020-08-01 ENCOUNTER — Other Ambulatory Visit: Payer: Self-pay

## 2020-08-01 ENCOUNTER — Ambulatory Visit: Payer: PPO | Admitting: Physical Therapy

## 2020-08-01 DIAGNOSIS — M5442 Lumbago with sciatica, left side: Secondary | ICD-10-CM

## 2020-08-01 DIAGNOSIS — G8929 Other chronic pain: Secondary | ICD-10-CM

## 2020-08-01 NOTE — Therapy (Signed)
[facility] [place], [place], [address] Phone: [phone]   Fax:  [phone]  Physical Therapy Treatment  Patient Details  Name: John Reese MRN: [id number] Date of Birth: 1950/11/27 Referring Provider (PT): Erline Levine MD   Encounter Date: 08/01/2020   PT End of Session - 08/01/20 0905    Visit Number 7    Number of Visits 12    Date for PT Re-Evaluation 08/17/20    Authorization Type FOTO 5th visit score 52.  PROGRESS NOTE AT 10TH VISIT.  KX MODIFIER AFTER 15 VISITS.    PT Start Time 0900    PT Stop Time 0941    PT Time Calculation (min) 41 min    Activity Tolerance Patient tolerated treatment well    Behavior During Therapy [facility] for tasks assessed/performed           Past Medical History:  Diagnosis Date  . Auditory disturbance    Decreased auditory acuity  . 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 Diabetes ([place])   . Dyslipidemia   . Tremor   . Trigeminal neuralgia    Right, V2 distribution    Past Surgical History:  Procedure Laterality Date  . FINGER AMPUTATION    . INNER EAR SURGERY Right    Ear drum repair  . PULSE GENERATOR IMPLANT Bilateral 03/30/2016   Procedure: BILATERAL PLACEMENT OF IMPLANTABLE PULSE GENERATOR;  Surgeon: Erline Levine, MD;  Location: [place];  Service: Neurosurgery;  Laterality: Bilateral;  BILATERAL PLACEMENT OF IMPLANTABLE PULSE GENERATOR  . SUBTHALAMIC STIMULATOR INSERTION Bilateral 03/22/2016   Procedure: BILATERAL DEEP BRAIN STIMULATOR PLACEMENT WITH STARFIX WITH DR. Carles Collet;  Surgeon: Erline Levine, MD;  Location: [place];  Service: Neurosurgery;  Laterality: Bilateral;    There were no vitals filed for this visit.   Subjective Assessment - 08/01/20 0906    Subjective COVID-19 screen performed prior to patient entering clinic. Patient arrived with some minimal symptoms, and did good after last treatment.    Pertinent History Deep  Brain stimulator, DM, Pulse generator implantation, subthalamic stimulator insertion.    Diagnostic tests MRI, X-ray.    Currently in Pain? No/denies                             [facility] Adult PT Treatment/Exercise - 08/01/20 0001      Lumbar Exercises: Stretches   Passive Hamstring Stretch Right;Left;2 reps;30 seconds    Figure 4 Stretch 2 reps;30 seconds;Seated    Figure 4 Stretch Limitations with trunk flexion      Lumbar Exercises: Aerobic   UBE (Upper Arm Bike) 90 RPM x8 min (forward/backward)      Lumbar Exercises: Standing   Shoulder Extension Strengthening;Both;20 reps    Shoulder Extension Limitations Blue XTS    Other Standing Lumbar Exercises B chop/lift blue XTS x20 reps; rockerboard x3 min    Other Standing Lumbar Exercises standing core punch with blue XTS 2x10 reps      Lumbar Exercises: Seated   Sit to Stand 15 reps   4# ball for posture     Lumbar Exercises: Supine   Bridge with clamshell 20 reps    Bridge with [employer] Limitations green band    Straight Leg Raise 3 seconds   2x10   Other Supine Lumbar Exercises Mcgill curl up  x10  hold 5 secs each      Lumbar Exercises: Sidelying  Hip Abduction Both;20 reps      Lumbar Exercises: Prone   Straight Leg Raise 20 reps    Other Prone Lumbar Exercises Prone pressup x15 reps      Lumbar Exercises: Quadruped   Other Quadruped Lumbar Exercises opposite LE with bracing x10 each LE                       PT Long Term Goals - 08/01/20 0906      PT LONG TERM GOAL #1   Title Independent with a HEP.    Baseline Patient reported doing HEP daily 07/25/20    Time 6    Period Weeks    Status Achieved      PT LONG TERM GOAL #2   Title Perform ADL's with no bilateral LE symptoms.    Baseline Patient reported less yet "some" ongoing symptoms 08/01/20    Time 6    Period Weeks    Status On-going                 Plan - 08/01/20 8182    Clinical Impression Statement Patient  arrived with no pain only mininal symptoms in post thighs. Patient tolerated treatment well today and able to continue to progress with all exercises. Patient doing well with yard work/ADL's and able to perform with greater ease. Patient remaining goal progressing.    Personal Factors and Comorbidities Other;Comorbidity 1    Comorbidities Deep Brain stimulator, DM, Pulse generator implantation, subthalamic stimulator insertion.    Examination-Activity Limitations Other    Examination-Participation Restrictions Other    Stability/Clinical Decision Making Evolving/Moderate complexity    Rehab Potential Good    PT Frequency 2x / week    PT Duration 6 weeks    PT Treatment/Interventions ADLs/Self Care Home Management;Cryotherapy;Moist Heat;Therapeutic activities;Therapeutic exercise;Manual techniques;Patient/family education;Passive range of motion;Dry needling;Joint Manipulations;Spinal Manipulations    PT Next Visit Plan cont with core exercise progression    Consulted and Agree with Plan of Care Patient           Patient will benefit from skilled therapeutic intervention in order to improve the following deficits and impairments:  Pain,Increased muscle spasms,Decreased activity tolerance  Visit Diagnosis: Chronic bilateral low back pain with bilateral sciatica     Problem List Patient Active Problem List   Diagnosis Date Noted  . Multiple fractures of ribs, bilateral, initial encounter for closed fracture 09/06/2017  . S/P deep brain stimulator placement 04/26/2016  . Essential tremor 04/13/2015  . Type 2 diabetes mellitus with other specified complication ([place]) 99/37/1696  . Essential hypertension 03/24/2014  . Mixed hyperlipidemia 03/24/2014  . Family history of coronary artery disease 03/24/2014  . Trigeminal neuralgia 03/24/2012    Phillips Climes, PTA 08/01/2020, 9:44 AM  [facility] [place], [place],  [address] Phone: [phone]   Fax:  [phone]  Name: John Reese MRN: [id number] Date of Birth: Jan 29, 1951

## 2020-08-04 ENCOUNTER — Encounter: Payer: Self-pay | Admitting: Family Medicine

## 2020-08-04 ENCOUNTER — Other Ambulatory Visit: Payer: Self-pay

## 2020-08-04 ENCOUNTER — Ambulatory Visit (INDEPENDENT_AMBULATORY_CARE_PROVIDER_SITE_OTHER): Payer: PPO | Admitting: Family Medicine

## 2020-08-04 VITALS — BP 122/66 | HR 87 | Ht 73.0 in | Wt 148.0 lb

## 2020-08-04 DIAGNOSIS — E1169 Type 2 diabetes mellitus with other specified complication: Secondary | ICD-10-CM | POA: Diagnosis not present

## 2020-08-04 DIAGNOSIS — Z794 Long term (current) use of insulin: Secondary | ICD-10-CM | POA: Diagnosis not present

## 2020-08-04 DIAGNOSIS — I10 Essential (primary) hypertension: Secondary | ICD-10-CM | POA: Diagnosis not present

## 2020-08-04 DIAGNOSIS — E782 Mixed hyperlipidemia: Secondary | ICD-10-CM | POA: Diagnosis not present

## 2020-08-04 LAB — BAYER DCA HB A1C WAIVED: HB A1C (BAYER DCA - WAIVED): 7.6 % — ABNORMAL HIGH (ref <7.0)

## 2020-08-04 MED ORDER — PRAVASTATIN SODIUM 40 MG PO TABS: 40.0000 mg | ORAL_TABLET | Freq: Every day | ORAL | 3 refills | Status: DC

## 2020-08-04 MED ORDER — EMPAGLIFLOZIN 10 MG PO TABS: 10.0000 mg | ORAL_TABLET | Freq: Every day | ORAL | 5 refills | Status: DC

## 2020-08-04 MED ORDER — TOUJEO SOLOSTAR 300 UNIT/ML ~~LOC~~ SOPN: 10.0000 [IU] | PEN_INJECTOR | Freq: Every day | SUBCUTANEOUS | 3 refills | Status: DC

## 2020-08-04 NOTE — Progress Notes (Signed)
BP 122/66   Pulse 87   Ht 6' 1"  (1.854 m)   Wt 148 lb (67.1 kg)   SpO2 100%   BMI 19.53 kg/m    Subjective:   Patient ID: John Reese, male    DOB: 1951/03/05, 70 y.o.   MRN: [id number]  HPI: [name] is a 70 y.o. male presenting on 08/04/2020 for Medical Management of Chronic Issues and Diabetes   HPI Type 2 diabetes mellitus Patient comes in today for recheck of his diabetes. Patient has been currently taking Toujeo 10 units daily, A1c 7.6. Patient is not currently on an ACE inhibitor/ARB. Patient has not seen an ophthalmologist this year. Patient denies any issues with their feet. The symptom started onset as an adult hypertension and hyperlipidemia ARE RELATED TO DM   Hypertension Patient is currently on no medication, and their blood pressure today is 122/66. Patient denies any lightheadedness or dizziness. Patient denies headaches, blurred vision, chest pains, shortness of breath, or weakness. Denies any side effects from medication and is content with current medication.   Hyperlipidemia Patient is coming in for recheck of his hyperlipidemia. The patient is currently taking pravastatin. They deny any issues with myalgias or history of liver damage from it. They deny any focal numbness or weakness or chest pain.   Relevant past medical, surgical, family and social history reviewed and updated as indicated. Interim medical history since our last visit reviewed. Allergies and medications reviewed and updated.  Review of Systems  Constitutional: Negative for chills and fever.  Eyes: Negative for visual disturbance.  Respiratory: Negative for shortness of breath and wheezing.   Cardiovascular: Negative for chest pain and leg swelling.  Musculoskeletal: Negative for back pain and gait problem.  Skin: Negative for rash.  Neurological: Negative for dizziness, weakness and light-headedness.  All other systems reviewed and are negative.   Per HPI unless specifically  indicated above   Allergies as of 08/04/2020      Reactions   No Known Allergies       Medication List       Accurate as of August 04, 2020  9:50 AM. If you have any questions, ask your nurse or doctor.        STOP taking these medications   glipiZIDE 2.5 MG 24 hr tablet Commonly known as: GLUCOTROL XL Stopped by: Fransisca Kaufmann Beyla Loney, MD     TAKE these medications   aspirin EC 81 MG tablet Take 81 mg by mouth daily.   CINNAMON PO Take 1,000 mg by mouth daily.   fish oil-omega-3 fatty acids 1000 MG capsule Take 2 g by mouth daily.   ibuprofen 200 MG tablet Commonly known as: ADVIL Take 800 mg by mouth as needed for moderate pain.   multivitamin tablet Take 1 tablet by mouth daily.   pravastatin 40 MG tablet Commonly known as: PRAVACHOL TAKE 1 TABLET BY MOUTH EVERY DAY   Toujeo SoloStar 300 UNIT/ML Solostar Pen Generic drug: insulin glargine (1 Unit Dial) Inject 10 Units into the skin at bedtime.   UltiCare Mini Pen Needles 31G X 6 MM Misc Generic drug: Insulin Pen Needle USE AS DIRECTED AT BEDTIME        Objective:   BP 122/66   Pulse 87   Ht 6' 1"  (1.854 m)   Wt 148 lb (67.1 kg)   SpO2 100%   BMI 19.53 kg/m   Wt Readings from Last 3 Encounters:  08/04/20 148 lb (67.1 kg)  03/30/20  145 lb (65.8 kg)  02/12/20 148 lb 3.2 oz (67.2 kg)    Physical Exam Vitals and nursing note reviewed.  Constitutional:      General: He is not in acute distress.    Appearance: He is well-developed. He is not diaphoretic.  Eyes:     General: No scleral icterus.    Conjunctiva/sclera: Conjunctivae normal.  Neck:     Thyroid: No thyromegaly.  Cardiovascular:     Rate and Rhythm: Normal rate and regular rhythm.     Heart sounds: Normal heart sounds. No murmur heard.   Pulmonary:     Effort: Pulmonary effort is normal. No respiratory distress.     Breath sounds: Normal breath sounds. No wheezing.  Musculoskeletal:        General: Normal range of motion.      Cervical back: Neck supple.  Lymphadenopathy:     Cervical: No cervical adenopathy.  Skin:    General: Skin is warm and dry.     Findings: No rash.  Neurological:     Mental Status: He is alert and oriented to person, place, and time.     Coordination: Coordination normal.  Psychiatric:        Behavior: Behavior normal.       Assessment & Plan:   Problem List Items Addressed This Visit      Cardiovascular and Mediastinum   Essential hypertension   Relevant Medications   pravastatin (PRAVACHOL) 40 MG tablet   Other Relevant Orders   CBC with Differential/Platelet   CMP14+EGFR   Lipid panel   Bayer DCA Hb A1c Waived     Endocrine   Type 2 diabetes mellitus with other specified complication (HCC) - Primary   Relevant Medications   pravastatin (PRAVACHOL) 40 MG tablet   insulin glargine, 1 Unit Dial, (TOUJEO SOLOSTAR) 300 UNIT/ML Solostar Pen   empagliflozin (JARDIANCE) 10 MG TABS tablet   Other Relevant Orders   CBC with Differential/Platelet   CMP14+EGFR   Lipid panel   Bayer DCA Hb A1c Waived     Other   Mixed hyperlipidemia   Relevant Medications   pravastatin (PRAVACHOL) 40 MG tablet   Other Relevant Orders   CBC with Differential/Platelet   CMP14+EGFR   Lipid panel   Bayer DCA Hb A1c Waived      A1c up slightly at 7.6, he says his blood sugars are running up throughout the day but good in the morning.  It sounds like it is postprandial.  Patient did not tolerate glipizide, will try Jardiance Follow up plan: Return in about 6 months (around 02/03/2021), or if symptoms worsen or fail to improve, for Diabetes and hypertension and cholesterol.  Counseling provided for all of the vaccine components Orders Placed This Encounter  Procedures  . CBC with Differential/Platelet  . CMP14+EGFR  . Lipid panel  . Bayer [facility] Hb A1c [place], MD [facility] 08/04/2020, 9:50 AM

## 2020-08-05 ENCOUNTER — Encounter: Payer: PPO | Admitting: Family Medicine

## 2020-08-05 LAB — LIPID PANEL
Chol/HDL Ratio: 3 ratio (ref 0.0–5.0)
Cholesterol, Total: 149 mg/dL (ref 100–199)
HDL: 50 mg/dL (ref >39)
LDL Chol Calc (NIH): 88 mg/dL (ref 0–99)
Triglycerides: 51 mg/dL (ref 0–149)
VLDL Cholesterol Cal: 11 mg/dL (ref 5–40)

## 2020-08-05 LAB — CMP14+EGFR
ALT: 18 IU/L (ref 0–44)
AST: 17 IU/L (ref 0–40)
Albumin/Globulin Ratio: 2.1 (ref 1.2–2.2)
Albumin: 4.7 g/dL (ref 3.7–4.7)
Alkaline Phosphatase: 51 IU/L (ref 44–121)
BUN/Creatinine Ratio: 19 (ref 10–24)
BUN: 24 mg/dL (ref 8–27)
Bilirubin Total: 0.4 mg/dL (ref 0.0–1.2)
CO2: 22 mmol/L (ref 20–29)
Calcium: 10.1 mg/dL (ref 8.6–10.2)
Chloride: 103 mmol/L (ref 96–106)
Creatinine, Ser: 1.28 mg/dL — ABNORMAL HIGH (ref 0.76–1.27)
Globulin, Total: 2.2 g/dL (ref 1.5–4.5)
Glucose: 125 mg/dL — ABNORMAL HIGH (ref 65–99)
Potassium: 5.2 mmol/L (ref 3.5–5.2)
Sodium: 141 mmol/L (ref 134–144)
Total Protein: 6.9 g/dL (ref 6.0–8.5)
eGFR: 60 mL/min/{1.73_m2} (ref >59)

## 2020-08-05 LAB — CBC WITH DIFFERENTIAL/PLATELET
Basophils Absolute: 0 10*3/uL (ref 0.0–0.2)
Basos: 1 %
EOS (ABSOLUTE): 0.2 10*3/uL (ref 0.0–0.4)
Eos: 4 %
Hematocrit: 40.2 % (ref 37.5–51.0)
Hemoglobin: 13.2 g/dL (ref 13.0–17.7)
Immature Grans (Abs): 0 10*3/uL (ref 0.0–0.1)
Immature Granulocytes: 0 %
Lymphocytes Absolute: 1.8 10*3/uL (ref 0.7–3.1)
Lymphs: 41 %
MCH: 28.9 pg (ref 26.6–33.0)
MCHC: 32.8 g/dL (ref 31.5–35.7)
MCV: 88 fL (ref 79–97)
Monocytes Absolute: 0.3 10*3/uL (ref 0.1–0.9)
Monocytes: 8 %
Neutrophils Absolute: 2 10*3/uL (ref 1.4–7.0)
Neutrophils: 46 %
Platelets: 185 10*3/uL (ref 150–450)
RBC: 4.56 x10E6/uL (ref 4.14–5.80)
RDW: 13 % (ref 11.6–15.4)
WBC: 4.4 10*3/uL (ref 3.4–10.8)

## 2020-08-08 ENCOUNTER — Ambulatory Visit: Payer: PPO

## 2020-08-08 ENCOUNTER — Other Ambulatory Visit: Payer: Self-pay

## 2020-08-08 DIAGNOSIS — G8929 Other chronic pain: Secondary | ICD-10-CM

## 2020-08-08 DIAGNOSIS — M5442 Lumbago with sciatica, left side: Secondary | ICD-10-CM | POA: Diagnosis not present

## 2020-08-08 DIAGNOSIS — M5441 Lumbago with sciatica, right side: Secondary | ICD-10-CM

## 2020-08-08 NOTE — Therapy (Signed)
[facility] [place], [place], [address] Phone: [phone]   Fax:  [phone]  Physical Therapy Treatment  Patient Details  Name: John Reese MRN: [id number] Date of Birth: [birth year] Referring Provider (PT): Erline Levine MD   Encounter Date: 08/08/2020   PT End of Session - 08/08/20 0818    Visit Number 8    Number of Visits 12    Date for PT Re-Evaluation 08/17/20    Authorization Type FOTO 5th visit score 52.  PROGRESS NOTE AT 10TH VISIT.  KX MODIFIER AFTER 15 VISITS.    PT Start Time [id number]    PT Stop Time 0856    PT Time Calculation (min) 42 min    Activity Tolerance Patient tolerated treatment well    Behavior During Therapy [facility] for tasks assessed/performed           Past Medical History:  Diagnosis Date  . Auditory disturbance    Decreased auditory acuity  . 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 Diabetes ([place])   . Dyslipidemia   . Tremor   . Trigeminal neuralgia    Right, V2 distribution    Past Surgical History:  Procedure Laterality Date  . FINGER AMPUTATION    . INNER EAR SURGERY Right    Ear drum repair  . PULSE GENERATOR IMPLANT Bilateral 03/30/2016   Procedure: BILATERAL PLACEMENT OF IMPLANTABLE PULSE GENERATOR;  Surgeon: Erline Levine, MD;  Location: [facility];  Service: Neurosurgery;  Laterality: Bilateral;  BILATERAL PLACEMENT OF IMPLANTABLE PULSE GENERATOR  . SUBTHALAMIC STIMULATOR INSERTION Bilateral 03/22/2016   Procedure: BILATERAL DEEP BRAIN STIMULATOR PLACEMENT WITH STARFIX WITH DR. Carles Collet;  Surgeon: Erline Levine, MD;  Location: [facility];  Service: Neurosurgery;  Laterality: Bilateral;    There were no vitals filed for this visit.   Subjective Assessment - 08/08/20 0817    Subjective COVID-19 screen performed prior to patient entering clinic. Patient arrived with reports of minimal symptoms, mainly posterior BLE ending at knees.  Reports change in  medication has been helping with blood sugar levels at night.    Pertinent History Deep Brain stimulator, DM, Pulse generator implantation, subthalamic stimulator insertion.    Currently in Pain? Yes    Pain Score 3     Pain Location Back    Pain Orientation Right;Left    Pain Descriptors / Indicators Discomfort    Pain Type Chronic pain    Pain Onset More than a month ago    Pain Frequency Constant    Aggravating Factors  4/18:  sitting for long periods of time    Pain Relieving Factors 4/18: keeping moving                             [facility] Adult PT Treatment/Exercise - 08/08/20 0001      Lumbar Exercises: Stretches   Passive Hamstring Stretch Right;Left;2 reps;30 seconds    Standing Extension 10 reps    Standing Extension Limitations 3D hip excursion      Lumbar Exercises: Aerobic   UBE (Upper Arm Bike) 90 RPM x8 min (forward/backward)      Lumbar Exercises: Standing   Functional Squats 15 reps;4 seconds    Functional Squats Limitations 3D hip excursion with ab bracing (weight shifting, squat/ext, rotate    Forward Lunge 15 reps    Theraband Level (Row) Level 4 (Blue)    Shoulder Extension Strengthening;Both;20 reps  Shoulder Extension Limitations Blue    Other Standing Lumbar Exercises B chop/lift blue XTS x20 reps; rockerboard x3 min    Other Standing Lumbar Exercises standing NBOS on foam paloff BTB 2x 15;      Lumbar Exercises: Prone   Opposite Arm/Leg Raise 10 reps    Other Prone Lumbar Exercises Prone pressup 15x 10"                       PT Long Term Goals - 08/01/20 0906      PT LONG TERM GOAL #1   Title Independent with a HEP.    Baseline Patient reported doing HEP daily 07/25/20    Time 6    Period Weeks    Status Achieved      PT LONG TERM GOAL #2   Title Perform ADL's with no bilateral LE symptoms.    Baseline Patient reported less yet "some" ongoing symptoms 08/01/20    Time 6    Period Weeks    Status On-going                  Plan - 08/08/20 0844    Clinical Impression Statement Pt tolerated well towards treatmetn with no reports of symptoms posterior thighs though session.  Session focus on core and proxinal strenghtening with verbal cueing and demonstration to improve form for abdominal activaiton.  No reports of pain through session, was limited by fatigue with activities.    Personal Factors and Comorbidities Other;Comorbidity 1    Comorbidities Deep Brain stimulator, DM, Pulse generator implantation, subthalamic stimulator insertion.    Examination-Activity Limitations Other    Examination-Participation Restrictions Other    Stability/Clinical Decision Making Evolving/Moderate complexity    Clinical Decision Making Low    Rehab Potential Good    PT Frequency 2x / week    PT Duration 6 weeks    PT Treatment/Interventions ADLs/Self Care Home Management;Cryotherapy;Moist Heat;Therapeutic activities;Therapeutic exercise;Manual techniques;Patient/family education;Passive range of motion;Dry needling;Joint Manipulations;Spinal Manipulations    PT Next Visit Plan cont with core exercise progression           Patient will benefit from skilled therapeutic intervention in order to improve the following deficits and impairments:  Pain,Increased muscle spasms,Decreased activity tolerance  Visit Diagnosis: Chronic bilateral low back pain with bilateral sciatica     Problem List Patient Active Problem List   Diagnosis Date Noted  . Multiple fractures of ribs, bilateral, initial encounter for closed fracture 09/06/2017  . S/P deep brain stimulator placement 04/26/2016  . Essential tremor 04/13/2015  . Type 2 diabetes mellitus with other specified complication ([place]) 51/76/1607  . Essential hypertension 03/24/2014  . Mixed hyperlipidemia 03/24/2014  . Family history of coronary artery disease 03/24/2014  . Trigeminal neuralgia 03/24/2012   Ihor Austin, LPTA/CLT;  CBIS [phone]  Aldona Lento 08/08/2020, 1:15 PM  [facility] [place], [place], [address] Phone: [phone]   Fax:  [phone]  Name: John Reese MRN: [id number] Date of Birth: December 22, 1950

## 2020-08-09 ENCOUNTER — Ambulatory Visit (INDEPENDENT_AMBULATORY_CARE_PROVIDER_SITE_OTHER): Payer: PPO

## 2020-08-09 DIAGNOSIS — Z0000 Encounter for general adult medical examination without abnormal findings: Secondary | ICD-10-CM | POA: Diagnosis not present

## 2020-08-09 NOTE — Progress Notes (Signed)
MEDICARE ANNUAL WELLNESS VISIT  08/09/2020  Telephone Visit Disclaimer This Medicare AWV was conducted by telephone due to national recommendations for restrictions regarding the COVID-19 Pandemic (e.g. social distancing).  I verified, using two identifiers, that I am speaking with John Reese or their authorized healthcare agent. I discussed the limitations, risks, security, and privacy concerns of performing an evaluation and management service by telephone and the potential availability of an in-person appointment in the future. The patient expressed understanding and agreed to proceed.  Location of Patient: Home Location of Provider (nurse):  [facility]  Subjective:    John Reese is a 70 y.o. male patient of [name], Fransisca Kaufmann, MD who had a Medicare Annual Wellness Visit today via telephone. John Reese is Retired and lives with their spouse. he has two children. he reports that he is socially active and does interact with friends/family regularly. he is minimally physically active and enjoys yardwork.  Patient Care Team: [name], Fransisca Kaufmann, MD as PCP - General (Family Medicine) Jerline Pain, MD as PCP - Cardiology (Cardiology) [name], Eustace Quail, DO as Consulting Physician (Neurology)  Advanced Directives 08/09/2020 07/06/2020 02/02/2020 08/03/2019 11/04/2018 03/19/2016 04/13/2015  Does Patient Have a Medical Advance Directive? Yes No No Yes Yes No Yes  Type of Advance Directive Healthcare Power of [place]   Does patient want to make changes to medical advance directive? No - Patient declined - - - - - -  Would patient like information on creating a medical advance directive? - - - - - No - Patient declined -    Hospital Utilization Over the Past 12 Months: # of hospitalizations or ER visits: 0 # of surgeries: 1  Review of Systems    Patient reports that his  overall health is better compared to last year.  Patient Reported Readings (BP, Pulse, CBG-98, Weight, etc)   Pain Assessment Pain : 0-10 Pain Score: 4  Pain Type: Chronic pain Pain Location: Back Pain Orientation: Right,Left Pain Radiating Towards: BLE Pain Descriptors / Indicators: Discomfort Pain Onset: More than a month ago Pain Frequency: Constant Pain Relieving Factors: Gabapentin prn  Pain Relieving Factors: Gabapentin prn  Current Medications & Allergies (verified) Allergies as of 08/09/2020      Reactions   No Known Allergies       Medication List       Accurate as of August 09, 2020  8:27 AM. If you have any questions, ask your nurse or doctor.        aspirin EC 81 MG tablet Take 81 mg by mouth daily.   CINNAMON PO Take 1,000 mg by mouth daily.   empagliflozin 10 MG Tabs tablet Commonly known as: Jardiance Take 1 tablet (10 mg total) by mouth daily before breakfast.   fish oil-omega-3 fatty acids 1000 MG capsule Take 2 g by mouth daily.   gabapentin 300 MG capsule Commonly known as: NEURONTIN Take 1 capsule by mouth 3 (three) times daily.   ibuprofen 200 MG tablet Commonly known as: ADVIL Take 800 mg by mouth as needed for moderate pain.   multivitamin tablet Take 1 tablet by mouth daily.   pravastatin 40 MG tablet Commonly known as: PRAVACHOL Take 1 tablet (40 mg total) by mouth daily.   Toujeo SoloStar 300 UNIT/ML Solostar Pen Generic drug: insulin glargine (1 Unit Dial) Inject 10 Units into the skin at bedtime.  UltiCare Mini Pen Needles 31G X 6 MM Misc Generic drug: Insulin Pen Needle USE AS DIRECTED AT BEDTIME       History (reviewed): Past Medical History:  Diagnosis Date  . Auditory disturbance    Decreased auditory acuity  . 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 Diabetes ([place])   . Dyslipidemia   . Tremor   . Trigeminal neuralgia    Right, V2 distribution   Past Surgical  History:  Procedure Laterality Date  . FINGER AMPUTATION    . INNER EAR SURGERY Right    Ear drum repair  . PULSE GENERATOR IMPLANT Bilateral 03/30/2016   Procedure: BILATERAL PLACEMENT OF IMPLANTABLE PULSE GENERATOR;  Surgeon: Erline Levine, MD;  Location: [facility];  Service: Neurosurgery;  Laterality: Bilateral;  BILATERAL PLACEMENT OF IMPLANTABLE PULSE GENERATOR  . SUBTHALAMIC STIMULATOR INSERTION Bilateral 03/22/2016   Procedure: BILATERAL DEEP BRAIN STIMULATOR PLACEMENT WITH STARFIX WITH DR. Carles Collet;  Surgeon: Erline Levine, MD;  Location: [facility];  Service: Neurosurgery;  Laterality: Bilateral;   Family History  Problem Relation Age of Onset  . Stroke Father   . Peripheral vascular disease Sister   . Heart attack Brother   . Stroke Mother   . Colon polyps Mother 47  . Healthy Brother   . Healthy Brother   . Healthy Brother   . Healthy Brother   . Healthy Sister   . Healthy Daughter   . Healthy Son   . Colon cancer Neg Hx   . Esophageal cancer Neg Hx   . Stomach cancer Neg Hx   . Rectal cancer Neg Hx    Social History   Socioeconomic History  . Marital status: Married    Spouse name: Not on file  . Number of children: 2  . Years of education: HS  . Highest education level: 12th grade  Occupational History  . Occupation: Works in Optometrist: [employer]    Comment: RETIRED  Tobacco Use  . Smoking status: Never Smoker  . Smokeless tobacco: Never Used  Vaping Use  . Vaping Use: Never used  Substance and Sexual Activity  . Alcohol use: No  . Drug use: No  . Sexual activity: Not on file  Other Topics Concern  . Not on file  Social History Narrative   Patient is right handed.   Patient drinks 2 cups coffee daily.   Social Determinants of Health   Financial Resource Strain: Not on file  Food Insecurity: Not on file  Transportation Needs: Not on file  Physical Activity: Not on file  Stress: Not on file  Social Connections: Not on file    Activities of Daily  Living In your present state of health, do you have any difficulty performing the following activities: 08/09/2020  Hearing? N  Vision? N  Difficulty concentrating or making decisions? N  Walking or climbing stairs? N  Dressing or bathing? N  Doing errands, shopping? N  Preparing Food and eating ? N  Using the Toilet? N  In the past six months, have you accidently leaked urine? N  Do you have problems with loss of bowel control? N  Managing your Medications? N  Managing your Finances? N  Housekeeping or managing your Housekeeping? N  Some recent data might be hidden    Patient Education/ Literacy How often do you need to have someone help you when you read instructions, pamphlets, or other written materials from your doctor or pharmacy?:  1 - Never What is the last grade level you completed in school?: 12th grade  Exercise Current Exercise Habits: Home exercise routine, Type of exercise: walking, Time (Minutes): 30, Frequency (Times/Week): 7, Weekly Exercise (Minutes/Week): 210, Intensity: Mild  Diet Patient reports consuming 3 meals a day and 2 snack(s) a day Patient reports that his primary diet is: Diabetic Patient reports that she does have regular access to food.   Depression Screen PHQ 2/9 Scores 08/09/2020 08/04/2020 03/30/2020 02/05/2020 08/06/2019 02/05/2019 02/14/2018  PHQ - 2 Score 0 0 0 0 0 0 0     Fall Risk Fall Risk  08/09/2020 08/04/2020 03/30/2020 02/05/2020 02/02/2020  Falls in the past year? 0 0 0 0 0  Number falls in past yr: - - - - 0  Comment - - - - -  Injury with Fall? - - - - 0  Risk Factor Category  - - - - -  Follow up - - - - -     Objective:  John Reese seemed alert and oriented and he participated appropriately during our telephone visit.  Blood Pressure Weight BMI  BP Readings from Last 3 Encounters:  08/04/20 122/66  03/30/20 129/79  02/12/20 125/69   Wt Readings from Last 3 Encounters:  08/04/20 148 lb (67.1 kg)  03/30/20 145 lb (65.8  kg)  02/12/20 148 lb 3.2 oz (67.2 kg)   BMI Readings from Last 1 Encounters:  08/04/20 19.53 kg/m    *Unable to obtain current vital signs, weight, and BMI due to telephone visit type  Hearing/Vision  . John Reese did not seem to have difficulty with hearing/understanding during the telephone conversation . Reports that he has had a formal eye exam by an eye care professional within the past year . Reports that he has not had a formal hearing evaluation within the past year *Unable to fully assess hearing and vision during telephone visit type  Cognitive Function: 6CIT Screen 08/09/2020  What Year? 0 points  What month? 0 points  What time? 0 points  Count back from 20 0 points  Months in reverse 0 points  Repeat phrase 2 points  Total Score 2   (Normal:0-7, Significant for Dysfunction: >8)  Normal Cognitive Function Screening: Yes   Immunization & Health Maintenance Record Immunization History  Administered Date(s) Administered  . Fluad Quad(high Dose 65+) 12/30/2018, 02/05/2020  . Influenza, High Dose Seasonal PF 02/07/2015, 02/01/2016, 01/02/2017, 01/24/2018, 01/24/2018  . Influenza-Unspecified 02/02/2014  . PFIZER(Purple Top)SARS-COV-2 Vaccination 05/10/2019, 05/28/2019, 01/07/2020  . Pneumococcal Conjugate-13 04/07/2015  . Pneumococcal Polysaccharide-23 07/02/2016, 12/30/2018  . Tdap 08/23/2017  . Zoster 12/02/2012    Health Maintenance  Topic Date Due  . Hepatitis C Screening  02/04/2021 (Originally 25-Apr-1950)  . COLONOSCOPY (Pts 45-44yrs Insurance coverage will need to be confirmed)  09/09/2020  . INFLUENZA VACCINE  11/21/2020  . HEMOGLOBIN A1C  02/03/2021  . OPHTHALMOLOGY EXAM  05/06/2021  . TETANUS/TDAP  08/24/2027  . COVID-19 Vaccine  Completed  . PNA vac Low Risk Adult  Completed  . HPV VACCINES  Aged Out  . FOOT EXAM  Discontinued  . URINE MICROALBUMIN  Discontinued       Assessment  This is a routine wellness examination for John Reese.  Health  Maintenance: Due or Overdue There are no preventive care reminders to display for this patient.  John Reese does not need a referral for Community Assistance: Care Management:   no Social Work:    no Prescription Assistance:  no Nutrition/Diabetes Education:  no   Plan:  Personalized Goals Goals Addressed              This Visit's Progress     Patient Stated   .  Client will verbalize knowledge of diabetes self-management as evidenced by Hgb A1C <7 or as defined by provider. (pt-stated)        Since starting Toujeo blood sugars are much better      Other   .  Exercise 150 min/wk Moderate Activity   On track     Personalized Health Maintenance & Screening Recommendations   Lung Cancer Screening Recommended: no (Low Dose CT Chest recommended if Age 39-80 years, 30 pack-year currently smoking OR have quit w/in past 15 years) Hepatitis C Screening recommended: no HIV Screening recommended: no  Advanced Directives: Written information was not prepared per patient's request.  Referrals & Orders No orders of the defined types were placed in this encounter.   Follow-up Plan . Follow-up with [name], Fransisca Kaufmann, MD as planned . Schedule 11/04/20    I have personally reviewed and noted the following in the patient's chart:   . Medical and social history . Use of alcohol, tobacco or illicit drugs  . Current medications and supplements . Functional ability and status . Nutritional status . Physical activity . Advanced directives . List of other physicians . Hospitalizations, surgeries, and ER visits in previous 12 months . Vitals . Screenings to include cognitive, depression, and falls . Referrals and appointments  In addition, I have reviewed and discussed with John Reese certain preventive protocols, quality metrics, and best practice recommendations. A written personalized care plan for preventive services as well as general preventive health  recommendations is available and can be mailed to the patient at his request.      Maud Deed [facility]  [id number]/[id number]

## 2020-08-09 NOTE — Patient Instructions (Signed)
  [facility] Maintenance Summary and Written Plan of Care  [name] ,  Thank you for allowing me to perform your Medicare Annual Wellness Visit and for your ongoing commitment to your health.   Health Maintenance & Immunization History Health Maintenance  Topic Date Due  . Hepatitis C Screening  02/04/2021 (Originally March 01, 1951)  . COLONOSCOPY (Pts 45-60yrs Insurance coverage will need to be confirmed)  09/09/2020  . INFLUENZA VACCINE  11/21/2020  . HEMOGLOBIN A1C  02/03/2021  . OPHTHALMOLOGY EXAM  05/06/2021  . TETANUS/TDAP  08/24/2027  . COVID-19 Vaccine  Completed  . PNA vac Low Risk Adult  Completed  . HPV VACCINES  Aged Out  . FOOT EXAM  Discontinued  . URINE MICROALBUMIN  Discontinued   Immunization History  Administered Date(s) Administered  . Fluad Quad(high Dose 65+) 12/30/2018, 02/05/2020  . Influenza, High Dose Seasonal PF 02/07/2015, 02/01/2016, 01/02/2017, 01/24/2018, 01/24/2018  . Influenza-Unspecified 02/02/2014  . PFIZER(Purple Top)SARS-COV-2 Vaccination 05/10/2019, 05/28/2019, 01/07/2020  . Pneumococcal Conjugate-13 04/07/2015  . Pneumococcal Polysaccharide-23 07/02/2016, 12/30/2018  . Tdap 08/23/2017  . Zoster 12/02/2012    These are the patient goals that we discussed: Goals Addressed              This Visit's Progress     Patient Stated   .  Client will verbalize knowledge of diabetes self-management as evidenced by Hgb A1C <7 or as defined by provider. (pt-stated)        Since starting Toujeo blood sugars are much better      Other   .  Exercise 150 min/wk Moderate Activity   On track       This is a list of Health Maintenance Items that are overdue or due now: There are no preventive care reminders to display for this patient.   Orders/Referrals Placed Today: No orders of the defined types were placed in this encounter.  (Contact our referral department at [phone] if you have not spoken with someone  about your referral appointment within the next 5 days)    Follow-up Plan  Scheduled with Dr. Warrick Parisian 11/04/20 at 10:55am

## 2020-08-10 ENCOUNTER — Ambulatory Visit: Payer: PPO | Admitting: Physical Therapy

## 2020-08-10 ENCOUNTER — Encounter: Payer: Self-pay | Admitting: Physical Therapy

## 2020-08-10 ENCOUNTER — Other Ambulatory Visit: Payer: Self-pay

## 2020-08-10 DIAGNOSIS — G8929 Other chronic pain: Secondary | ICD-10-CM

## 2020-08-10 DIAGNOSIS — M5442 Lumbago with sciatica, left side: Secondary | ICD-10-CM | POA: Diagnosis not present

## 2020-08-10 NOTE — Therapy (Signed)
[facility] [place], [place], [address] Phone: [phone]   Fax:  [phone]  Physical Therapy Treatment  Patient Details  Name: John Reese MRN: [id number] Date of Birth: Jan 02, 1951 Referring Provider (PT): Erline Levine MD   Encounter Date: 08/10/2020   PT End of Session - 08/10/20 0840    Visit Number 9    Number of Visits 12    Date for PT Re-Evaluation 08/17/20    Authorization Type FOTO 5th visit score 52.  PROGRESS NOTE AT 10TH VISIT.  KX MODIFIER AFTER 15 VISITS.    PT Start Time [phone]    PT Stop Time 0859    PT Time Calculation (min) 43 min    Activity Tolerance Patient tolerated treatment well    Behavior During Therapy [facility] for tasks assessed/performed           Past Medical History:  Diagnosis Date  . Auditory disturbance    Decreased auditory acuity  . 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 Diabetes ([place])   . Dyslipidemia   . Tremor   . Trigeminal neuralgia    Right, V2 distribution    Past Surgical History:  Procedure Laterality Date  . FINGER AMPUTATION    . INNER EAR SURGERY Right    Ear drum repair  . PULSE GENERATOR IMPLANT Bilateral 03/30/2016   Procedure: BILATERAL PLACEMENT OF IMPLANTABLE PULSE GENERATOR;  Surgeon: Erline Levine, MD;  Location: [place];  Service: Neurosurgery;  Laterality: Bilateral;  BILATERAL PLACEMENT OF IMPLANTABLE PULSE GENERATOR  . SUBTHALAMIC STIMULATOR INSERTION Bilateral 03/22/2016   Procedure: BILATERAL DEEP BRAIN STIMULATOR PLACEMENT WITH STARFIX WITH DR. Carles Collet;  Surgeon: Erline Levine, MD;  Location: [place];  Service: Neurosurgery;  Laterality: Bilateral;    There were no vitals filed for this visit.   Subjective Assessment - 08/10/20 0839    Subjective COVID-19 screen performed prior to patient entering clinic. Patient reports no LBP or LE pain but has had more cramping in LEs at night.    Pertinent History Deep  Brain stimulator, DM, Pulse generator implantation, subthalamic stimulator insertion.    Diagnostic tests MRI, X-ray.    Currently in Pain? No/denies              [facility] PT Assessment - 08/10/20 0001      Assessment   Medical Diagnosis Lumbago of lumbar region with scitica.    Referring Provider (PT) Erline Levine MD    Next MD Visit "2 month if not better"      Precautions   Precaution Comments Deep brain stimulator.      Restrictions   Weight Bearing Restrictions No                         [facility] Adult PT Treatment/Exercise - 08/10/20 0001      Lumbar Exercises: Stretches   Passive Hamstring Stretch Right;Left;2 reps;30 seconds    Other Lumbar Stretch Exercise Rockerboard x3 min      Lumbar Exercises: Aerobic   Recumbent Bike L4 x10 min      Lumbar Exercises: Standing   Forward Lunge 15 reps;3 seconds   2# reachouts   Shoulder Extension Strengthening;Both;20 reps    Shoulder Extension Limitations BlueXTS    Other Standing Lumbar Exercises B chop/lift 2# ball x15 reps each    Other Standing Lumbar Exercises B core punch blue XTS x20 reps      Lumbar  Exercises: Seated   Sit to Stand 20 reps;Limitations    Sit to Stand Limitations 4# ball      Lumbar Exercises: Supine   Bridge with March 15 reps;5 seconds      Lumbar Exercises: Sidelying   Clam Both;20 reps;3 seconds;Limitations    Clam Limitations green theraband      Lumbar Exercises: Prone   Straight Leg Raise 15 reps;3 seconds    Other Prone Lumbar Exercises prone pressups x10 reps 5 sec holds                       PT Long Term Goals - 08/01/20 0906      PT LONG TERM GOAL #1   Title Independent with a HEP.    Baseline Patient reported doing HEP daily 07/25/20    Time 6    Period Weeks    Status Achieved      PT LONG TERM GOAL #2   Title Perform ADL's with no bilateral LE symptoms.    Baseline Patient reported less yet "some" ongoing symptoms 08/01/20    Time 6    Period  Weeks    Status On-going                 Plan - 08/10/20 0909    Clinical Impression Statement Patient presented in clinic with reports of LE cramping at night. Patient reports that he was recently told he was slightly dehydrated and has increased his water intake and has started a new medication but cramping had started prior to new medication. Patient progressed through stretching and functional strengthening with core bracing instructed throughout session. No complaints of LBP or LE pain.    Personal Factors and Comorbidities Other;Comorbidity 1    Comorbidities Deep Brain stimulator, DM, Pulse generator implantation, subthalamic stimulator insertion.    Examination-Activity Limitations Other    Examination-Participation Restrictions Other    Stability/Clinical Decision Making Evolving/Moderate complexity    Rehab Potential Good    PT Frequency 2x / week    PT Duration 6 weeks    PT Treatment/Interventions ADLs/Self Care Home Management;Cryotherapy;Moist Heat;Therapeutic activities;Therapeutic exercise;Manual techniques;Patient/family education;Passive range of motion;Dry needling;Joint Manipulations;Spinal Manipulations    PT Next Visit Plan cont with core exercise progression    Consulted and Agree with Plan of Care Patient           Patient will benefit from skilled therapeutic intervention in order to improve the following deficits and impairments:  Pain,Increased muscle spasms,Decreased activity tolerance  Visit Diagnosis: Chronic bilateral low back pain with bilateral sciatica     Problem List Patient Active Problem List   Diagnosis Date Noted  . Multiple fractures of ribs, bilateral, initial encounter for closed fracture 09/06/2017  . S/P deep brain stimulator placement 04/26/2016  . Essential tremor 04/13/2015  . Type 2 diabetes mellitus with other specified complication ([place]) 49/70/2637  . Essential hypertension 03/24/2014  . Mixed hyperlipidemia 03/24/2014   . Family history of coronary artery disease 03/24/2014  . Trigeminal neuralgia 03/24/2012    John Reese, PTA 08/10/2020, 9:12 AM  [facility] [address] [place], [place], [address] Phone: [phone]   Fax:  [phone]  Name: John Reese MRN: [id number] Date of Birth: 04-09-1951

## 2020-08-15 ENCOUNTER — Ambulatory Visit: Payer: PPO | Admitting: Physical Therapy

## 2020-08-15 ENCOUNTER — Other Ambulatory Visit: Payer: Self-pay

## 2020-08-15 DIAGNOSIS — M5442 Lumbago with sciatica, left side: Secondary | ICD-10-CM | POA: Diagnosis not present

## 2020-08-15 DIAGNOSIS — G8929 Other chronic pain: Secondary | ICD-10-CM

## 2020-08-15 NOTE — Therapy (Addendum)
[facility] [place], [place], [address] Phone: [phone]   Fax:  [phone]  Physical Therapy Treatment  Patient Details  Name: John Reese MRN: [id number] Date of Birth: 08-13-1950 Referring Provider (PT): Erline Levine MD   Encounter Date: 08/15/2020   PT End of Session - 08/15/20 0902    Visit Number 10    Number of Visits 12    Date for PT Re-Evaluation 08/17/20    Authorization Type FOTO 10th visit score 94 .  PROGRESS NOTE AT 10TH VISIT.  KX MODIFIER AFTER 15 VISITS.    PT Start Time 0900    PT Stop Time [phone]    PT Time Calculation (min) 42 min    Activity Tolerance Patient tolerated treatment well    Behavior During Therapy [facility] for tasks assessed/performed           Past Medical History:  Diagnosis Date  . Auditory disturbance    Decreased auditory acuity  . 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 Diabetes ([place])   . Dyslipidemia   . Tremor   . Trigeminal neuralgia    Right, V2 distribution    Past Surgical History:  Procedure Laterality Date  . FINGER AMPUTATION    . INNER EAR SURGERY Right    Ear drum repair  . PULSE GENERATOR IMPLANT Bilateral 03/30/2016   Procedure: BILATERAL PLACEMENT OF IMPLANTABLE PULSE GENERATOR;  Surgeon: Erline Levine, MD;  Location: [place];  Service: Neurosurgery;  Laterality: Bilateral;  BILATERAL PLACEMENT OF IMPLANTABLE PULSE GENERATOR  . SUBTHALAMIC STIMULATOR INSERTION Bilateral 03/22/2016   Procedure: BILATERAL DEEP BRAIN STIMULATOR PLACEMENT WITH STARFIX WITH DR. Carles Collet;  Surgeon: Erline Levine, MD;  Location: [place];  Service: Neurosurgery;  Laterality: Bilateral;    There were no vitals filed for this visit.   Subjective Assessment - 08/15/20 0859    Subjective COVID-19 screen performed prior to patient entering clinic. Patient reports doing well today.    Pertinent History Deep Brain stimulator, DM, Pulse generator  implantation, subthalamic stimulator insertion.    Diagnostic tests MRI, X-ray.    Currently in Pain? No/denies                             [facility] Adult PT Treatment/Exercise - 08/15/20 0001      Lumbar Exercises: Stretches   Passive Hamstring Stretch Right;Left;2 reps;30 seconds    Other Lumbar Stretch Exercise Rockerboard x3 min      Lumbar Exercises: Aerobic   UBE (Upper Arm Bike) 90 RPM x8 min (forward/backward)   standing for core focus   Nustep L5 x42min UE/LEcore focus      Lumbar Exercises: Standing   Forward Lunge 3 seconds   2# ball reach outs 2x10   Shoulder Extension Strengthening;Both;20 reps    Shoulder Extension Limitations Blue XTS    Other Standing Lumbar Exercises B chop/lift  and step outs 4# ball 2x10 reps each    Other Standing Lumbar Exercises B core punch blue XTS x20 reps      Lumbar Exercises: Seated   Sit to Stand 20 reps    Sit to Stand Limitations 4# ball      Lumbar Exercises: Supine   Bridge with clamshell 20 reps    Bridge with [employer] Limitations green band      Lumbar Exercises: Sidelying   Clam Both;20 reps;3 seconds;Limitations    Clam Limitations  green theraband                       PT Long Term Goals - 08/15/20 0859      PT LONG TERM GOAL #1   Title Independent with a HEP.    Baseline Patient reported doing HEP daily 07/25/20    Time 6    Period Weeks    Status Achieved      PT LONG TERM GOAL #2   Title Perform ADL's with no bilateral LE symptoms.    Baseline Patient reported "very little" symptoms 08/15/20    Time 6    Period Weeks    Status Partially Met                 Plan - 08/15/20 0934    Clinical Impression Statement Patient tolerated treatment well today. Patient progressing with abdominal bracing and core activities. Patient reported very little LE symptoms today. Patient is able to perform all ADL's / yard work with no difficulty. Patient doing well with HEP and maintaining  with all activities. Patient close to meeting all goals. DC next visit.    Personal Factors and Comorbidities Other;Comorbidity 1    Comorbidities Deep Brain stimulator, DM, Pulse generator implantation, subthalamic stimulator insertion.    Examination-Activity Limitations Other    Examination-Participation Restrictions Other    Stability/Clinical Decision Making Evolving/Moderate complexity    Rehab Potential Good    PT Frequency 2x / week    PT Duration 6 weeks    PT Treatment/Interventions ADLs/Self Care Home Management;Cryotherapy;Moist Heat;Therapeutic activities;Therapeutic exercise;Manual techniques;Patient/family education;Passive range of motion;Dry needling;Joint Manipulations;Spinal Manipulations    PT Next Visit Plan DC next visit    Consulted and Agree with Plan of Care Patient           Patient will benefit from skilled therapeutic intervention in order to improve the following deficits and impairments:  Pain,Increased muscle spasms,Decreased activity tolerance  Visit Diagnosis: Chronic bilateral low back pain with bilateral sciatica     Problem List Patient Active Problem List   Diagnosis Date Noted  . Multiple fractures of ribs, bilateral, initial encounter for closed fracture 09/06/2017  . S/P deep brain stimulator placement 04/26/2016  . Essential tremor 04/13/2015  . Type 2 diabetes mellitus with other specified complication ([place]) 98/01/2547  . Essential hypertension 03/24/2014  . Mixed hyperlipidemia 03/24/2014  . Family history of coronary artery disease 03/24/2014  . Trigeminal neuralgia 03/24/2012    Phillips Climes, PTA 08/15/2020, 9:44 AM  [facility] [place], [place], [address] Phone: [phone]   Fax:  [phone]  Name: John Reese MRN: [id number] Date of Birth: 05-01-1950  Progress Note Reporting Period 07/06/20 to 08/15/20.  See note below for Objective Data and Assessment  of Progress/Goals. Excellent progress toward goals.  Please see above.    Mali Applegate MPT

## 2020-08-17 ENCOUNTER — Ambulatory Visit: Payer: PPO | Admitting: "Endocrinology

## 2020-08-17 ENCOUNTER — Ambulatory Visit: Payer: PPO | Admitting: Physical Therapy

## 2020-08-17 ENCOUNTER — Encounter: Payer: Self-pay | Admitting: Physical Therapy

## 2020-08-17 ENCOUNTER — Other Ambulatory Visit: Payer: Self-pay

## 2020-08-17 DIAGNOSIS — M5442 Lumbago with sciatica, left side: Secondary | ICD-10-CM | POA: Diagnosis not present

## 2020-08-17 DIAGNOSIS — M5441 Lumbago with sciatica, right side: Secondary | ICD-10-CM

## 2020-08-17 DIAGNOSIS — G8929 Other chronic pain: Secondary | ICD-10-CM

## 2020-08-17 NOTE — Therapy (Addendum)
[facility] [place], [place], [address] Phone: [phone]   Fax:  [phone]  Physical Therapy Treatment  Patient Details  Name: John Reese MRN: [id number] Date of Birth: 1950/10/17 Referring Provider (PT): Erline Levine MD   Encounter Date: 08/17/2020   PT End of Session - 08/17/20 0828    Visit Number 11    Number of Visits 12    Date for PT Re-Evaluation 08/17/20    Authorization Type FOTO 10th visit score 94 .  PROGRESS NOTE AT 10TH VISIT.  KX MODIFIER AFTER 15 VISITS.    PT Start Time [phone]    PT Stop Time 0858    PT Time Calculation (min) 41 min    Activity Tolerance Patient tolerated treatment well    Behavior During Therapy [facility] for tasks assessed/performed           Past Medical History:  Diagnosis Date  . Auditory disturbance    Decreased auditory acuity  . 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 Diabetes ([place])   . Dyslipidemia   . Tremor   . Trigeminal neuralgia    Right, V2 distribution    Past Surgical History:  Procedure Laterality Date  . FINGER AMPUTATION    . INNER EAR SURGERY Right    Ear drum repair  . PULSE GENERATOR IMPLANT Bilateral 03/30/2016   Procedure: BILATERAL PLACEMENT OF IMPLANTABLE PULSE GENERATOR;  Surgeon: Erline Levine, MD;  Location: [place];  Service: Neurosurgery;  Laterality: Bilateral;  BILATERAL PLACEMENT OF IMPLANTABLE PULSE GENERATOR  . SUBTHALAMIC STIMULATOR INSERTION Bilateral 03/22/2016   Procedure: BILATERAL DEEP BRAIN STIMULATOR PLACEMENT WITH STARFIX WITH DR. Carles Collet;  Surgeon: Erline Levine, MD;  Location: [facility];  Service: Neurosurgery;  Laterality: Bilateral;    There were no vitals filed for this visit.   Subjective Assessment - 08/17/20 0819    Subjective COVID-19 screen performed prior to patient entering clinic. Patient reports doing well today.    Pertinent History Deep Brain stimulator, DM, Pulse generator  implantation, subthalamic stimulator insertion.    Diagnostic tests MRI, X-ray.    Currently in Pain? No/denies              [facility] PT Assessment - 08/17/20 0001      Assessment   Medical Diagnosis Lumbago of lumbar region with scitica.    Referring Provider (PT) Erline Levine MD    Next MD Visit "2 month if not better"      Precautions   Precaution Comments Deep brain stimulator.      Restrictions   Weight Bearing Restrictions No                         [facility] Adult PT Treatment/Exercise - 08/17/20 0001      Therapeutic Activites    Therapeutic Activities Lifting    Lifting Proper squat techique and lifting with ball, 14" box; squat with pivots also instructed as well.      Lumbar Exercises: Aerobic   UBE (Upper Arm Bike) 90 RPM x8 min (forward/backward)    Nustep L4 x11 min UE/LEcore focus      Lumbar Exercises: Standing   Forward Lunge 20 reps;3 seconds;Other (comment)   6# reachouts   Shoulder Extension Strengthening;Both;20 reps    Shoulder Extension Limitations Blue XTS    Other Standing Lumbar Exercises B chop/lift blue XTS x20 reps    Other Standing Lumbar Exercises B core  punch blue XTS x20 reps      Lumbar Exercises: Supine   Straight Leg Raise 15 reps;3 seconds   square SLR                      PT Long Term Goals - 08/17/20 0910      PT LONG TERM GOAL #1   Title Independent with a HEP.    Baseline Patient reported doing HEP daily 07/25/20    Time 6    Period Weeks    Status Achieved      PT LONG TERM GOAL #2   Title Perform ADL's with no bilateral LE symptoms.    Baseline Patient reported "very little" symptoms 08/15/20    Time 6    Period Weeks    Status Achieved                 Plan - 08/17/20 6812    Clinical Impression Statement Patient presented in clinic with reports of no LBP or LE symptoms. Patient fully independent and confident with ADLs as he has no pain now. Patient did not have any pain with planting  his garden recently. Patient able to tolerate progression of functional core training without complaint of increased pain. Patient educated regarding proper technique for lifting, squat, pivoting as well as kneeling in order to garden. Patient able to demonstrate good squat technique for lifting. Patient denied any concerns regarding discharge.    Personal Factors and Comorbidities Other;Comorbidity 1    Comorbidities Deep Brain stimulator, DM, Pulse generator implantation, subthalamic stimulator insertion.    Examination-Activity Limitations Other    Examination-Participation Restrictions Other    Stability/Clinical Decision Making Evolving/Moderate complexity    Rehab Potential Good    PT Frequency 2x / week    PT Duration 6 weeks    PT Treatment/Interventions ADLs/Self Care Home Management;Cryotherapy;Moist Heat;Therapeutic activities;Therapeutic exercise;Manual techniques;Patient/family education;Passive range of motion;Dry needling;Joint Manipulations;Spinal Manipulations    PT Next Visit Plan D/C summary    Consulted and Agree with Plan of Care Patient           Patient will benefit from skilled therapeutic intervention in order to improve the following deficits and impairments:  Pain,Increased muscle spasms,Decreased activity tolerance  Visit Diagnosis: Chronic bilateral low back pain with bilateral sciatica     Problem List Patient Active Problem List   Diagnosis Date Noted  . Multiple fractures of ribs, bilateral, initial encounter for closed fracture 09/06/2017  . S/P deep brain stimulator placement 04/26/2016  . Essential tremor 04/13/2015  . Type 2 diabetes mellitus with other specified complication ([place]) 75/17/0017  . Essential hypertension 03/24/2014  . Mixed hyperlipidemia 03/24/2014  . Family history of coronary artery disease 03/24/2014  . Trigeminal neuralgia 03/24/2012    Standley Brooking, PTA 08/17/20 9:10 AM   [facility] [address] [place], [place], [address] Phone: [phone]   Fax:  [phone]  Name: John Reese MRN: [id number] Date of Birth: Sep 20, 1950  PHYSICAL THERAPY DISCHARGE SUMMARY  Visits from Start of Care: 11.  Current functional level related to goals / functional outcomes: See above.    Remaining deficits: All goals met.   Education / Equipment: HEP. Plan: Patient agrees to discharge.  Patient goals were met. Patient is being discharged due to meeting the stated rehab goals.  ?????         Mali Applegate MPT

## 2020-09-26 ENCOUNTER — Telehealth: Payer: Self-pay | Admitting: Family Medicine

## 2020-09-26 NOTE — Telephone Encounter (Signed)
REFERRAL REQUEST Telephone Note  Have you been seen at our office for this problem?  (Advise that they may need an appointment with their PCP before a referral can be done)  Reason for Referral: f/u yearly colonoscopy Referral discussed with patient:  Best contact number of patient for referral team:  [phone]  Has patient been seen by a specialist for this issue before: yes Patient provider preference for referral:  Patient location preference for referral: [place]?   Patient notified that referrals can take up to a week or longer to process. If they haven't heard anything within a week they should call back and speak with the referral department.   [name]'s pt  Please call wife.

## 2020-09-29 ENCOUNTER — Other Ambulatory Visit: Payer: Self-pay | Admitting: Family Medicine

## 2020-09-29 DIAGNOSIS — Z1211 Encounter for screening for malignant neoplasm of colon: Secondary | ICD-10-CM

## 2020-09-29 NOTE — Telephone Encounter (Signed)
Referral placed for patient.  

## 2020-09-29 NOTE — Progress Notes (Signed)
Colon cancer screening referral placed

## 2020-10-03 NOTE — Progress Notes (Signed)
Assessment/Plan:    1.  Essential Tremor   -Status post DBS surgery.  IPG last changed March 22, 2016.  R IPG at [facility] at 2.58.  Initially couldn't figure out why both my and patients device said that battery was OK when clearly its at end of service.  Had to contact medtronic rep and she agreed that his device is [facility].  Will send for change.  Discussed type of battery to be replaced, including rechargeable batteries.  Patient wants to be replaced with same battery for now, and in 5 years he may want the new rechargeable.  2.  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  -pt now off of lyrica and carbamazepine  -had V2/V3 rhizotomy with Dr. Vertell Limber 10/2019  -had repeat V2/V3 rhizotomy with Dr. Vertell Limber October, 2021.  Great results and pain-free currently.  3.  Low back pain  -MRI lumbar spine in February, 2022 fairly unremarkable.  Following with Dr. Vertell Limber.  Has done physical therapy.  Subjective:   KAEO JACOME was seen today in follow up for essential tremor.  My previous records were reviewed prior to todays visit. Pt denies falls.  Pt denies lightheadedness, near syncope.  No hallucinations.  Mood has been good.  Received notes from Dr. Vertell Limber dated May 20, 2020.  At that point, he had no facial pain.  Most of the visit was spent discussing low back pain.  Lumbar MRI was recommended.  This was completed February 23 and was really fairly unremarkable.  Patient doing well from trigeminal neuralgia standpoint.  Current prescribed movement disorder medications: None (weaned off)  Prior meds: Carbamazepine; Lyrica (myoclonus with higher dosages)   ALLERGIES:   Allergies  Allergen Reactions   No Known Allergies     CURRENT MEDICATIONS:  Outpatient Encounter Medications as of 10/04/2020  Medication Sig   aspirin EC 81 MG tablet Take 81 mg by mouth daily.   CINNAMON PO Take 1,000 mg by mouth daily.   empagliflozin  (JARDIANCE) 10 MG TABS tablet Take 1 tablet (10 mg total) by mouth daily before breakfast.   fish oil-omega-3 fatty acids 1000 MG capsule Take 2 g by mouth daily.   ibuprofen (ADVIL,MOTRIN) 200 MG tablet Take 800 mg by mouth as needed for moderate pain.   insulin glargine, 1 Unit Dial, (TOUJEO SOLOSTAR) 300 UNIT/ML Solostar Pen Inject 10 Units into the skin at bedtime.   Insulin Pen Needle (ULTICARE MINI PEN NEEDLES) 31G X 6 MM MISC USE AS DIRECTED AT BEDTIME   Multiple Vitamin (MULTIVITAMIN) tablet Take 1 tablet by mouth daily.   pravastatin (PRAVACHOL) 40 MG tablet Take 1 tablet (40 mg total) by mouth daily.   gabapentin (NEURONTIN) 300 MG capsule Take 1 capsule by mouth 3 (three) times daily. (Patient not taking: Reported on 10/04/2020)   No facility-administered encounter medications on file as of 10/04/2020.     Objective:    PHYSICAL EXAMINATION:    VITALS:   Vitals:   10/04/20 1102  BP: 122/62  Pulse: 60  SpO2: 100%  Weight: 140 lb (63.5 kg)  Height: 6\' 1"  (1.854 m)     GEN:  The patient appears stated age and is in NAD. HEENT:  Normocephalic, atraumatic.  The mucous membranes are moist. The superficial temporal arteries are without ropiness or tenderness.   Neurological examination:  Orientation: The patient is alert and oriented x3. Cranial nerves: There is good facial symmetry. The speech is  fluent and clear. Soft palate rises symmetrically and there is no tongue deviation. Hearing is intact to conversational tone. Sensation: Sensation is intact to light touch throughout Motor: Strength is at least antigravity x4.  Movement examination: Tone: There is normal tone in the UE/LE Abnormal movements: none.  No rest tremor.  No postural tremor.  No intention tremor. Coordination:  There is no decremation with RAM's Gait and Station: The patient has no difficulty arising out of a deep-seated chair without the use of the hands. The patient's stride length is good I have  reviewed and interpreted the following labs independently   Chemistry      Component Value Date/Time   NA 141 08/04/2020 1001   K 5.2 08/04/2020 1001   CL 103 08/04/2020 1001   CO2 22 08/04/2020 1001   BUN 24 08/04/2020 1001   CREATININE 1.28 (H) 08/04/2020 1001   CREATININE 0.96 10/06/2019 0925      Component Value Date/Time   CALCIUM 10.1 08/04/2020 1001   ALKPHOS 51 08/04/2020 1001   AST 17 08/04/2020 1001   ALT 18 08/04/2020 1001   BILITOT 0.4 08/04/2020 1001      Lab Results  Component Value Date   WBC 4.4 08/04/2020   HGB 13.2 08/04/2020   HCT 40.2 08/04/2020   MCV 88 08/04/2020   PLT 185 08/04/2020   Lab Results  Component Value Date   TSH 1.39 10/06/2019     Chemistry      Component Value Date/Time   NA 141 08/04/2020 1001   K 5.2 08/04/2020 1001   CL 103 08/04/2020 1001   CO2 22 08/04/2020 1001   BUN 24 08/04/2020 1001   CREATININE 1.28 (H) 08/04/2020 1001   CREATININE 0.96 10/06/2019 0925      Component Value Date/Time   CALCIUM 10.1 08/04/2020 1001   ALKPHOS 51 08/04/2020 1001   AST 17 08/04/2020 1001   ALT 18 08/04/2020 1001   BILITOT 0.4 08/04/2020 1001         Total time spent on today's visit was 43minutes, including both face-to-face time and nonface-to-face time.  Time included that spent on review of records (prior notes available to me/labs/imaging if pertinent), discussing treatment and goals, answering patient's questions and coordinating care.  This was separate from dbs time  Cc:  [name], Fransisca Kaufmann, MD

## 2020-10-04 ENCOUNTER — Other Ambulatory Visit: Payer: Self-pay

## 2020-10-04 ENCOUNTER — Ambulatory Visit: Payer: PPO | Admitting: Neurology

## 2020-10-04 ENCOUNTER — Encounter: Payer: Self-pay | Admitting: Neurology

## 2020-10-04 VITALS — BP 122/62 | HR 60 | Ht 73.0 in | Wt 140.0 lb

## 2020-10-04 DIAGNOSIS — Z9689 Presence of other specified functional implants: Secondary | ICD-10-CM | POA: Diagnosis not present

## 2020-10-04 DIAGNOSIS — G500 Trigeminal neuralgia: Secondary | ICD-10-CM | POA: Diagnosis not present

## 2020-10-04 DIAGNOSIS — G250 Essential tremor: Secondary | ICD-10-CM

## 2020-10-04 NOTE — Procedures (Signed)
DBS Programming was performed.    Manufacturer of DBS device: Medtronic  Total time spent programming was 8 minutes.  Device was confirmed to be on.  Soft start was confirmed to be on.  Impedences were checked and were within normal limits.  Battery was checked and was determined to be functioning normally and at end of life on the right.  Final settings were as follows:   Active Contact Amplitude (V) PW (ms) Frequency (hz) Side Effects Battery  Left Brain 1-2+ 2.8 90 150  2.85  02/02/20        10/04/20 1-2+ 2.8 90 150  2.75                  Right Brain        02/02/20 1-2+ 2.7 90 150  2.83  10/04/20 1-2+ 2.7 90 150  2.58

## 2020-10-14 ENCOUNTER — Other Ambulatory Visit: Payer: Self-pay | Admitting: Family Medicine

## 2020-10-14 DIAGNOSIS — Z794 Long term (current) use of insulin: Secondary | ICD-10-CM

## 2020-10-14 DIAGNOSIS — E1169 Type 2 diabetes mellitus with other specified complication: Secondary | ICD-10-CM

## 2020-10-17 DIAGNOSIS — G250 Essential tremor: Secondary | ICD-10-CM | POA: Diagnosis not present

## 2020-10-17 DIAGNOSIS — Z4542 Encounter for adjustment and management of neuropacemaker (brain) (peripheral nerve) (spinal cord): Secondary | ICD-10-CM | POA: Diagnosis not present

## 2020-10-18 ENCOUNTER — Other Ambulatory Visit: Payer: Self-pay | Admitting: Neurosurgery

## 2020-10-22 NOTE — H&P (Signed)
  Patient ID:   [id number] Patient: John Reese  Date of Birth: 11/22/1950 Visit Type: Office Visit   Date: 10/17/2020 03:45 PM Provider: Marchia Meiers. Vertell Limber MD   This 70 year old male presents for Follow Up of Tremor.  HISTORY OF PRESENT ILLNESS:  1.  Follow Up of Tremor  Patient returns on referral from Dr. Carles Collet for replacement of bilateral deep brain stimulator implanted pulse generators.  Medtronic non-rechargeable.  Batteries were placed with his initial implant in 2017.         Medical/Surgical/Interim History Reviewed, no change.  Last detailed document date:09/16/2019.     Family History:  Reviewed, no changes.  Last detailed document date:09/16/2019.   Social History: Reviewed, no changes. Last detailed document date: 09/16/2019.    MEDICATIONS: (added, continued or stopped this visit) Started Medication Directions Instruction Stopped   carbamazepine 200 mg tablet take 1 tablet by oral route  every 12 hours  10/17/2020   clonazepam 0.5 mg tablet take 1/2  tablet by oral route 2 times every day  10/17/2020  06/22/2020 diclofenac sodium 75 mg tablet,delayed release take 1 tablet by oral route 2 times every day as needed  10/17/2020   Jardiance 10 mg tablet take 1 tablet by oral route  every day in the morning     metformin 500 mg tablet take 1 tablet by oral route 2 times every day with morning and evening meals  10/17/2020  06/22/2020 Neurontin 300 mg capsule take 1 capsule by oral route 3 times every day  10/17/2020   pravastatin 40 mg tablet take 1 tablet by oral route  every day     propanolol ER 20 ORAL Take 2 tablets by mouth twice daily  10/17/2020   ramipril 2.5 mg capsule take 1 capsule by oral route  every day  10/17/2020   Toujeo SoloStar 300 unit/mL (1.5 mL) subcutaneous insulin pen 15 untis daily       ALLERGIES: Ingredient Reaction Medication Name Comment  NO KNOWN ALLERGIES     No known allergies. Reviewed, no changes.    PHYSICAL EXAM:    Vitals Date Temp F BP Pulse Ht In Wt Lb BMI BSA Pain Score  10/17/2020  134/72 69 73 138.2 18.23  0/10      IMPRESSION:   John Reese is in good spirits, accompanied by his wife.  PLAN:  Planned for implanted pulse generator/battery change July 12th at [facility].  Left chest and right chest Medtronic non rechargeable.   Assessment/Plan   # Detail Type Description   1. Assessment End of battery life of deep brain stimulator (Z45.42).       2. Assessment Essential tremor (G25.0).                     Provider:  Marchia Meiers. Vertell Limber MD  10/17/2020 04:59 PM    Dictation edited by: Mike Craze. Poteat RN    CC Providers: Vonna Kotyk  [name] [facility] [address] [place],  [place]  [id number]-   Rebecca [name]  [address] [place], [place] [address]               Electronically signed by Marchia Meiers Vertell Limber MD on 10/17/2020 06:25 PM

## 2020-10-27 NOTE — Progress Notes (Signed)
DUE TO COVID-19 ONLY ONE VISITOR IS ALLOWED TO COME WITH YOU AND STAY IN THE WAITING ROOM ONLY DURING PRE OP AND PROCEDURE DAY OF SURGERY. Two  VISITORS  MAY VISIT WITH YOU AFTER SURGERY IN YOUR PRIVATE ROOM DURING VISITING HOURS ONLY!   PCP - [name], Fransisca Kaufmann, MD Cardiologist - Candee Furbish, MD  Endocrinology - Cassandria Anger, MD Neurology- Eustace Quail. [name], DO  Chest x-ray - n/a EKG - DOS Stress Test - 03/2014 ECHO - 05/29/18 Cardiac Cath - n/a  Sleep Study -  n/a CPAP - None  Fasting Blood Sugar - 70-103 Checks Blood Sugar 2 times a day  Do not take Jardiance the morning of surgery.  THE NIGHT BEFORE SURGERY, take __5_______ units of _________Toujeo__insulin.      If your blood sugar is less than 70 mg/dL, you will need to treat for low blood sugar: Treat a low blood sugar (less than 70 mg/dL) with  cup of clear juice (cranberry or apple), 4 glucose tablets, OR glucose gel. Recheck blood sugar in 15 minutes after treatment (to make sure it is greater than 70 mg/dL). If your blood sugar is not greater than 70 mg/dL on recheck, call [phone] for further instructions.  Aspirin Instructions: Follow your surgeon's instructions on when to stop aspirin prior to surgery,  If no instructions were given by your surgeon then you will need to call the office for those instructions.  ERAS: Yes - Clear liquids til 12:15AM DOS  Anesthesia review: Yes  STOP now taking any Aspirin (unless otherwise instructed by your surgeon), Aleve, Naproxen, Ibuprofen, Motrin, Advil, Goody's, BC's, all herbal medications, fish oil, and all vitamins.   Coronavirus Screening Covid test is scheduled on Monday 10/31/20 Do you have any of the following symptoms:  Cough yes/no: No Fever (>100.24F)  yes/no: No Runny nose yes/no: No Sore throat yes/no: No Difficulty breathing/shortness of breath  yes/no: No  Have you traveled in the last 14 days and where? yes/no: No  Patient verbalized  understanding of instructions that were given to them via phone.

## 2020-10-31 ENCOUNTER — Other Ambulatory Visit (HOSPITAL_COMMUNITY)
Admission: RE | Admit: 2020-10-31 | Discharge: 2020-10-31 | Disposition: A | Discharge status: Home / Self Care | Payer: PPO | Source: Ambulatory Visit | Attending: Neurosurgery | Admitting: Neurosurgery

## 2020-10-31 DIAGNOSIS — Z01812 Encounter for preprocedural laboratory examination: Secondary | ICD-10-CM | POA: Diagnosis not present

## 2020-10-31 DIAGNOSIS — Z20822 Contact with and (suspected) exposure to covid-19: Secondary | ICD-10-CM | POA: Diagnosis not present

## 2020-11-01 ENCOUNTER — Encounter (HOSPITAL_COMMUNITY)
Admission: RE | Disposition: A | Discharge status: Home / Self Care | Payer: Self-pay | Source: Home / Self Care | Attending: Neurosurgery

## 2020-11-01 ENCOUNTER — Ambulatory Visit (HOSPITAL_COMMUNITY)
Admission: RE | Admit: 2020-11-01 | Discharge: 2020-11-01 | Disposition: A | Discharge status: Home / Self Care | Payer: PPO | Attending: Neurosurgery | Admitting: Neurosurgery

## 2020-11-01 ENCOUNTER — Encounter (HOSPITAL_COMMUNITY): Payer: Self-pay | Admitting: Neurosurgery

## 2020-11-01 ENCOUNTER — Ambulatory Visit (HOSPITAL_COMMUNITY): Payer: PPO | Admitting: Physician Assistant

## 2020-11-01 ENCOUNTER — Other Ambulatory Visit: Payer: Self-pay

## 2020-11-01 DIAGNOSIS — G250 Essential tremor: Secondary | ICD-10-CM | POA: Diagnosis not present

## 2020-11-01 DIAGNOSIS — Z4542 Encounter for adjustment and management of neuropacemaker (brain) (peripheral nerve) (spinal cord): Secondary | ICD-10-CM | POA: Insufficient documentation

## 2020-11-01 DIAGNOSIS — E119 Type 2 diabetes mellitus without complications: Secondary | ICD-10-CM | POA: Diagnosis not present

## 2020-11-01 DIAGNOSIS — E782 Mixed hyperlipidemia: Secondary | ICD-10-CM | POA: Diagnosis not present

## 2020-11-01 DIAGNOSIS — Z794 Long term (current) use of insulin: Secondary | ICD-10-CM | POA: Diagnosis not present

## 2020-11-01 HISTORY — PX: SUBTHALAMIC STIMULATOR BATTERY REPLACEMENT: SHX5405

## 2020-11-01 LAB — CBC
HCT: 42.4 % (ref 39.0–52.0)
Hemoglobin: 13.3 g/dL (ref 13.0–17.0)
MCH: 28.8 pg (ref 26.0–34.0)
MCHC: 31.4 g/dL (ref 30.0–36.0)
MCV: 91.8 fL (ref 80.0–100.0)
Platelets: 147 10*3/uL — ABNORMAL LOW (ref 150–400)
RBC: 4.62 MIL/uL (ref 4.22–5.81)
RDW: 13.3 % (ref 11.5–15.5)
WBC: 4.8 10*3/uL (ref 4.0–10.5)
nRBC: 0 % (ref 0.0–0.2)

## 2020-11-01 LAB — BASIC METABOLIC PANEL
Anion gap: 10 (ref 5–15)
BUN: 23 mg/dL (ref 8–23)
CO2: 24 mmol/L (ref 22–32)
Calcium: 9.5 mg/dL (ref 8.9–10.3)
Chloride: 103 mmol/L (ref 98–111)
Creatinine, Ser: 1.12 mg/dL (ref 0.61–1.24)
GFR, Estimated: >60 mL/min (ref >60)
Glucose, Bld: 100 mg/dL — ABNORMAL HIGH (ref 70–99)
Potassium: 4.1 mmol/L (ref 3.5–5.1)
Sodium: 137 mmol/L (ref 135–145)

## 2020-11-01 LAB — TYPE AND SCREEN
ABO/RH(D): A POS
Antibody Screen: NEGATIVE

## 2020-11-01 LAB — GLUCOSE, CAPILLARY
Glucose-Capillary: 106 mg/dL — ABNORMAL HIGH (ref 70–99)
Glucose-Capillary: 75 mg/dL (ref 70–99)
Glucose-Capillary: 79 mg/dL (ref 70–99)
Glucose-Capillary: 80 mg/dL (ref 70–99)

## 2020-11-01 LAB — SARS CORONAVIRUS 2 (TAT 6-24 HRS): SARS Coronavirus 2: NEGATIVE

## 2020-11-01 LAB — SURGICAL PCR SCREEN
MRSA, PCR: NEGATIVE
Staphylococcus aureus: POSITIVE — AB

## 2020-11-01 SURGERY — SUBTHALAMIC STIMULATOR BATTERY REPLACEMENT
Anesthesia: General

## 2020-11-01 MED ORDER — BUPIVACAINE HCL (PF) 0.5 % IJ SOLN
INTRAMUSCULAR | Status: AC
  Filled 2020-11-01: qty 30

## 2020-11-01 MED ORDER — FENTANYL CITRATE (PF) 250 MCG/5ML IJ SOLN
INTRAMUSCULAR | Status: AC
  Filled 2020-11-01: qty 5

## 2020-11-01 MED ORDER — PROPOFOL 10 MG/ML IV BOLUS
INTRAVENOUS | Status: DC | PRN
  Administered 2020-11-01: 100 mg via INTRAVENOUS

## 2020-11-01 MED ORDER — DEXAMETHASONE SODIUM PHOSPHATE 10 MG/ML IJ SOLN
INTRAMUSCULAR | Status: DC | PRN
  Administered 2020-11-01: 10 mg via INTRAVENOUS

## 2020-11-01 MED ORDER — VANCOMYCIN HCL 1000 MG IV SOLR
INTRAVENOUS | Status: AC
  Filled 2020-11-01: qty 1000

## 2020-11-01 MED ORDER — ROCURONIUM BROMIDE 10 MG/ML (PF) SYRINGE
PREFILLED_SYRINGE | INTRAVENOUS | Status: AC
  Filled 2020-11-01: qty 10

## 2020-11-01 MED ORDER — CHLORHEXIDINE GLUCONATE 0.12 % MT SOLN
OROMUCOSAL | Status: AC
  Administered 2020-11-01: 15 mL
  Filled 2020-11-01: qty 15

## 2020-11-01 MED ORDER — PROPOFOL 10 MG/ML IV BOLUS
INTRAVENOUS | Status: AC
  Filled 2020-11-01: qty 20

## 2020-11-01 MED ORDER — PHENYLEPHRINE 40 MCG/ML (10ML) SYRINGE FOR IV PUSH (FOR BLOOD PRESSURE SUPPORT)
PREFILLED_SYRINGE | INTRAVENOUS | Status: AC
  Filled 2020-11-01: qty 10

## 2020-11-01 MED ORDER — ONDANSETRON HCL 4 MG/2ML IJ SOLN
INTRAMUSCULAR | Status: AC
  Filled 2020-11-01: qty 2

## 2020-11-01 MED ORDER — MIDAZOLAM HCL 2 MG/2ML IJ SOLN
INTRAMUSCULAR | Status: DC | PRN
  Administered 2020-11-01: 1 mg via INTRAVENOUS

## 2020-11-01 MED ORDER — BACITRACIN ZINC 500 UNIT/GM EX OINT
TOPICAL_OINTMENT | CUTANEOUS | Status: AC
  Filled 2020-11-01: qty 28.35

## 2020-11-01 MED ORDER — LACTATED RINGERS IV SOLN: INTRAVENOUS | Status: DC

## 2020-11-01 MED ORDER — ORAL CARE MOUTH RINSE: 15.0000 mL | Freq: Once | OROMUCOSAL | Status: AC

## 2020-11-01 MED ORDER — ONDANSETRON HCL 4 MG/2ML IJ SOLN
INTRAMUSCULAR | Status: DC | PRN
  Administered 2020-11-01: 4 mg via INTRAVENOUS

## 2020-11-01 MED ORDER — DEXAMETHASONE SODIUM PHOSPHATE 10 MG/ML IJ SOLN
INTRAMUSCULAR | Status: AC
  Filled 2020-11-01: qty 2

## 2020-11-01 MED ORDER — CHLORHEXIDINE GLUCONATE CLOTH 2 % EX PADS: 6.0000 | MEDICATED_PAD | Freq: Once | CUTANEOUS | Status: DC

## 2020-11-01 MED ORDER — LIDOCAINE 2% (20 MG/ML) 5 ML SYRINGE
INTRAMUSCULAR | Status: DC | PRN
  Administered 2020-11-01: 60 mg via INTRAVENOUS

## 2020-11-01 MED ORDER — CEFAZOLIN SODIUM-DEXTROSE 2-4 GM/100ML-% IV SOLN
INTRAVENOUS | Status: AC
  Filled 2020-11-01: qty 100

## 2020-11-01 MED ORDER — 0.9 % SODIUM CHLORIDE (POUR BTL) OPTIME
TOPICAL | Status: DC | PRN
  Administered 2020-11-01: 1000 mL

## 2020-11-01 MED ORDER — VANCOMYCIN HCL 1000 MG IV SOLR
INTRAVENOUS | Status: DC | PRN
  Administered 2020-11-01: 1000 mg

## 2020-11-01 MED ORDER — BUPIVACAINE HCL (PF) 0.5 % IJ SOLN
INTRAMUSCULAR | Status: DC | PRN
  Administered 2020-11-01: 10 mL

## 2020-11-01 MED ORDER — CEFAZOLIN SODIUM-DEXTROSE 2-4 GM/100ML-% IV SOLN
2.0000 g | INTRAVENOUS | Status: AC
Start: 2020-11-02 — End: 2020-11-01
  Administered 2020-11-01: 2 g via INTRAVENOUS

## 2020-11-01 MED ORDER — ACETAMINOPHEN 500 MG PO TABS: 1000.0000 mg | ORAL_TABLET | Freq: Once | ORAL | Status: DC

## 2020-11-01 MED ORDER — CHLORHEXIDINE GLUCONATE 0.12 % MT SOLN: 15.0000 mL | Freq: Once | OROMUCOSAL | Status: AC

## 2020-11-01 MED ORDER — FENTANYL CITRATE (PF) 250 MCG/5ML IJ SOLN
INTRAMUSCULAR | Status: DC | PRN
  Administered 2020-11-01: 25 ug via INTRAVENOUS
  Administered 2020-11-01: 50 ug via INTRAVENOUS

## 2020-11-01 MED ORDER — LIDOCAINE 2% (20 MG/ML) 5 ML SYRINGE
INTRAMUSCULAR | Status: AC
  Filled 2020-11-01: qty 5

## 2020-11-01 MED ORDER — EPHEDRINE SULFATE 50 MG/ML IJ SOLN
INTRAMUSCULAR | Status: DC | PRN
  Administered 2020-11-01 (×2): 10 mg via INTRAVENOUS

## 2020-11-01 MED ORDER — LIDOCAINE-EPINEPHRINE 1 %-1:100000 IJ SOLN
INTRAMUSCULAR | Status: AC
  Filled 2020-11-01: qty 1

## 2020-11-01 MED ORDER — FENTANYL CITRATE (PF) 100 MCG/2ML IJ SOLN: 25.0000 ug | INTRAMUSCULAR | Status: DC | PRN

## 2020-11-01 MED ORDER — LIDOCAINE-EPINEPHRINE 1 %-1:100000 IJ SOLN
INTRAMUSCULAR | Status: DC | PRN
  Administered 2020-11-01: 10 mL

## 2020-11-01 MED ORDER — MIDAZOLAM HCL 2 MG/2ML IJ SOLN
INTRAMUSCULAR | Status: AC
  Filled 2020-11-01: qty 2

## 2020-11-01 SURGICAL SUPPLY — 39 items
ADH SKN CLS APL DERMABOND .7 (GAUZE/BANDAGES/DRESSINGS) ×1
BAG COUNTER SPONGE SURGICOUNT (BAG) ×2 IMPLANT
BAG SPNG CNTER NS LX DISP (BAG) ×1
CANISTER SUCT 3000ML PPV (MISCELLANEOUS) ×2 IMPLANT
CARTRIDGE OIL MAESTRO DRILL (MISCELLANEOUS) ×1 IMPLANT
DECANTER SPIKE VIAL GLASS SM (MISCELLANEOUS) ×2 IMPLANT
DERMABOND ADVANCED (GAUZE/BANDAGES/DRESSINGS) ×1
DERMABOND ADVANCED .7 DNX12 (GAUZE/BANDAGES/DRESSINGS) ×1 IMPLANT
DIFFUSER DRILL AIR PNEUMATIC (MISCELLANEOUS) ×1 IMPLANT
DRAPE CAMERA VIDEO/LASER (DRAPES) ×1 IMPLANT
DRAPE LAPAROTOMY 100X72 PEDS (DRAPES) ×2 IMPLANT
DRSG OPSITE POSTOP 3X4 (GAUZE/BANDAGES/DRESSINGS) ×2 IMPLANT
DURAPREP 26ML APPLICATOR (WOUND CARE) ×2 IMPLANT
GAUZE 4X4 16PLY ~~LOC~~+RFID DBL (SPONGE) IMPLANT
GLOVE EXAM NITRILE XL STR (GLOVE) IMPLANT
GLOVE SRG 8 PF TXTR STRL LF DI (GLOVE) ×1 IMPLANT
GLOVE SURG ENC MOIS LTX SZ8 (GLOVE) ×2 IMPLANT
GLOVE SURG LTX SZ8 (GLOVE) ×2 IMPLANT
GLOVE SURG UNDER POLY LF SZ8 (GLOVE) ×2
GLOVE SURG UNDER POLY LF SZ8.5 (GLOVE) ×2 IMPLANT
GOWN STRL REUS W/ TWL LRG LVL3 (GOWN DISPOSABLE) IMPLANT
GOWN STRL REUS W/ TWL XL LVL3 (GOWN DISPOSABLE) ×1 IMPLANT
GOWN STRL REUS W/TWL 2XL LVL3 (GOWN DISPOSABLE) ×2 IMPLANT
GOWN STRL REUS W/TWL LRG LVL3 (GOWN DISPOSABLE) ×2
GOWN STRL REUS W/TWL XL LVL3 (GOWN DISPOSABLE) ×2
KIT BASIN OR (CUSTOM PROCEDURE TRAY) ×2 IMPLANT
KIT TURNOVER KIT B (KITS) ×2 IMPLANT
NDL HYPO 25X1 1.5 SAFETY (NEEDLE) ×1 IMPLANT
NEEDLE HYPO 25X1 1.5 SAFETY (NEEDLE) ×2 IMPLANT
NEUROSTIM OCTOPOLAR ~~LOC~~ 60X55 (Neuro Prosthesis/Implant) ×2 IMPLANT
NS IRRIG 1000ML POUR BTL (IV SOLUTION) ×2 IMPLANT
OIL CARTRIDGE MAESTRO DRILL (MISCELLANEOUS)
PACK LAMINECTOMY NEURO (CUSTOM PROCEDURE TRAY) ×2 IMPLANT
PAD ARMBOARD 7.5X6 YLW CONV (MISCELLANEOUS) ×6 IMPLANT
SUT VIC AB 2-0 CP2 18 (SUTURE) ×2 IMPLANT
SUT VIC AB 3-0 SH 8-18 (SUTURE) ×3 IMPLANT
TOWEL GREEN STERILE (TOWEL DISPOSABLE) ×2 IMPLANT
TOWEL GREEN STERILE FF (TOWEL DISPOSABLE) ×2 IMPLANT
WATER STERILE IRR 1000ML POUR (IV SOLUTION) ×2 IMPLANT

## 2020-11-01 NOTE — Anesthesia Procedure Notes (Signed)
Procedure Name: LMA Insertion Date/Time: 11/01/2020 5:22 PM Performed by: Clearnce Sorrel, CRNA Pre-anesthesia Checklist: Patient identified, Emergency Drugs available, Suction available and Patient being monitored Patient Re-evaluated:Patient Re-evaluated prior to induction Oxygen Delivery Method: Circle System Utilized Preoxygenation: Pre-oxygenation with 100% oxygen Induction Type: IV induction Ventilation: Mask ventilation without difficulty LMA: LMA inserted LMA Size: 4.0 Number of attempts: 1 Airway Equipment and Method: Bite block Placement Confirmation: positive ETCO2 Tube secured with: Tape Dental Injury: Teeth and Oropharynx as per pre-operative assessment

## 2020-11-01 NOTE — Anesthesia Preprocedure Evaluation (Addendum)
Anesthesia Evaluation  Patient identified by MRN, date of birth, ID band Patient awake    Reviewed: Allergy & Precautions, H&P , NPO status , Patient's Chart, lab work & pertinent test results  Airway Mallampati: III  TM Distance: >3 FB Neck ROM: Full    Dental no notable dental hx. (+) Teeth Intact, Dental Advisory Given   Pulmonary neg pulmonary ROS,    Pulmonary exam normal breath sounds clear to auscultation       Cardiovascular negative cardio ROS   Rhythm:Regular Rate:Normal     Neuro/Psych negative neurological ROS  negative psych ROS   GI/Hepatic negative GI ROS, Neg liver ROS,   Endo/Other  diabetes, Well Controlled, Insulin Dependent  Renal/GU negative Renal ROS  negative genitourinary   Musculoskeletal   Abdominal   Peds  Hematology negative hematology ROS (+)   Anesthesia Other Findings   Reproductive/Obstetrics negative OB ROS                            Anesthesia Physical Anesthesia Plan  ASA: 3  Anesthesia Plan: General   Post-op Pain Management:    Induction: Intravenous  PONV Risk Score and Plan: 3 and Ondansetron and Treatment may vary due to age or medical condition  Airway Management Planned: Oral ETT  Additional Equipment:   Intra-op Plan:   Post-operative Plan: Extubation in OR  Informed Consent: I have reviewed the patients History and Physical, chart, labs and discussed the procedure including the risks, benefits and alternatives for the proposed anesthesia with the patient or authorized representative who has indicated his/her understanding and acceptance.     Dental advisory given  Plan Discussed with: CRNA  Anesthesia Plan Comments:         Anesthesia Quick Evaluation

## 2020-11-01 NOTE — Brief Op Note (Signed)
11/01/2020  6:15 PM  PATIENT:  John Reese  70 y.o. male  PRE-OPERATIVE DIAGNOSIS:  End of battery life of bilateral deep brain stimulator  POST-OPERATIVE DIAGNOSIS:  End of battery life of bilateral deep brain stimulator  PROCEDURE:  Procedure(s) with comments: Implantable pulse generator battery change, Right and left chest (N/A) - 3C/RM 21  SURGEON:  Surgeon(s) and Role:    Erline Levine, MD - Primary  PHYSICIAN ASSISTANT:   ASSISTANTS: Poteat, RN   ANESTHESIA:   general LMA  EBL:  5 mL   BLOOD ADMINISTERED:none  DRAINS: none   LOCAL MEDICATIONS USED:  MARCAINE    and LIDOCAINE   SPECIMEN:  No Specimen  DISPOSITION OF SPECIMEN:  N/A  COUNTS:  YES  TOURNIQUET:  * No tourniquets in log *  DICTATION: DICTATION: Patient has implanted subthalamic stimulator electrodes and bilateral  IPGs, which are now depleted.  It was elected for patient to undergo bilateral IPG revision.  PROCEDURE: Patient was brought to the operating room and given LMA general anesthesia.  Bilateral upper chest were prepped with betadine scrub and Duraprep.  Area of planned incision was infiltrated with lidocaine.  Prior incisions were reopened and the old IPGs were externalized.  New IPGs (Medtronic Activa [place]) were placed in the pockets.  Wound was irrigated with vancomycin. Then irrigated once more.  Incisions were closed with 2-0 Vicryl and 3-0 vicryl sutures and dressed with Dermabond and sterile occlusive dressings.  Counts were correct at the end of the case.   PLAN OF CARE: Discharge to home after PACU  PATIENT DISPOSITION:  PACU - hemodynamically stable.   Delay start of Pharmacological VTE agent (>24hrs) due to surgical blood loss or risk of bleeding: yes

## 2020-11-01 NOTE — Op Note (Signed)
11/01/2020  6:15 PM  PATIENT:  John Reese  70 y.o. male  PRE-OPERATIVE DIAGNOSIS:  End of battery life of bilateral deep brain stimulator  POST-OPERATIVE DIAGNOSIS:  End of battery life of bilateral deep brain stimulator  PROCEDURE:  Procedure(s) with comments: Implantable pulse generator battery change, Right and left chest (N/A) - 3C/RM 21  SURGEON:  Surgeon(s) and Role:    Erline Levine, MD - Primary  PHYSICIAN ASSISTANT:   ASSISTANTS: Poteat, RN   ANESTHESIA:   general LMA  EBL:  5 mL   BLOOD ADMINISTERED:none  DRAINS: none   LOCAL MEDICATIONS USED:  MARCAINE    and LIDOCAINE   SPECIMEN:  No Specimen  DISPOSITION OF SPECIMEN:  N/A  COUNTS:  YES  TOURNIQUET:  * No tourniquets in log *  DICTATION: DICTATION: Patient has implanted subthalamic stimulator electrodes and bilateral  IPGs, which are now depleted.  It was elected for patient to undergo bilateral IPG revision.  PROCEDURE: Patient was brought to the operating room and given LMA general anesthesia.  Bilateral upper chest were prepped with betadine scrub and Duraprep.  Area of planned incision was infiltrated with lidocaine.  Prior incisions were reopened and the old IPGs were externalized.  New IPGs (Medtronic Activa [place]) were placed in the pockets.  Wound was irrigated with vancomycin. Then irrigated once more.  Incisions were closed with 2-0 Vicryl and 3-0 vicryl sutures and dressed with Dermabond and sterile occlusive dressings.  Counts were correct at the end of the case.   PLAN OF CARE: Discharge to home after PACU  PATIENT DISPOSITION:  PACU - hemodynamically stable.   Delay start of Pharmacological VTE agent (>24hrs) due to surgical blood loss or risk of bleeding: yes

## 2020-11-01 NOTE — Anesthesia Postprocedure Evaluation (Signed)
Anesthesia Post Note  Patient: John Reese  Procedure(s) Performed: Implantable pulse generator battery change, Right and left chest     Patient location during evaluation: PACU Anesthesia Type: General Level of consciousness: sedated and patient cooperative Pain management: pain level controlled Vital Signs Assessment: post-procedure vital signs reviewed and stable Respiratory status: spontaneous breathing Cardiovascular status: stable Anesthetic complications: no   No notable events documented.  Last Vitals:  Vitals:   11/01/20 1835 11/01/20 1850  BP: 108/87 (!) 129/57  Pulse: 83 81  Resp: 18 12  Temp:  (!) 36.1 C  SpO2: 100% 100%    Last Pain:  Vitals:   11/01/20 1850  TempSrc:   PainSc: 0-No pain                 Nolon Nations

## 2020-11-01 NOTE — Interval H&P Note (Signed)
History and Physical Interval Note:  11/01/2020 4:27 PM  John Reese  has presented today for surgery, with the diagnosis of End of battery life of deep brain stimulator.  The various methods of treatment have been discussed with the patient and family. After consideration of risks, benefits and other options for treatment, the patient has consented to  Procedure(s) with comments: Implantable pulse generator battery change, Right and left chest (N/A) - 3C/RM 21 as a surgical intervention.  The patient's history has been reviewed, patient examined, no change in status, stable for surgery.  I have reviewed the patient's chart and labs.  Questions were answered to the patient's satisfaction.     Peggyann Shoals

## 2020-11-01 NOTE — Transfer of Care (Signed)
Immediate Anesthesia Transfer of Care Note  Patient: John Reese  Procedure(s) Performed: Implantable pulse generator battery change, Right and left chest  Patient Location: PACU  Anesthesia Type:General  Level of Consciousness: drowsy  Airway & Oxygen Therapy: Patient Spontanous Breathing  Post-op Assessment: Report given to RN and Post -op Vital signs reviewed and stable  Post vital signs: Reviewed and stable  Last Vitals:  Vitals Value Taken Time  BP 119/45 11/01/20 1819  Temp    Pulse 93 11/01/20 1822  Resp 19 11/01/20 1822  SpO2 100 % 11/01/20 1822  Vitals shown include unvalidated device data.  Last Pain:  Vitals:   11/01/20 1314  TempSrc:   PainSc: 0-No pain         Complications: No notable events documented.

## 2020-11-04 ENCOUNTER — Other Ambulatory Visit: Payer: Self-pay

## 2020-11-04 ENCOUNTER — Encounter: Payer: Self-pay | Admitting: Family Medicine

## 2020-11-04 ENCOUNTER — Encounter: Payer: Self-pay | Admitting: Gastroenterology

## 2020-11-04 ENCOUNTER — Ambulatory Visit (INDEPENDENT_AMBULATORY_CARE_PROVIDER_SITE_OTHER): Payer: PPO | Admitting: Family Medicine

## 2020-11-04 VITALS — BP 128/68 | HR 78 | Ht 73.0 in | Wt 136.0 lb

## 2020-11-04 DIAGNOSIS — E1169 Type 2 diabetes mellitus with other specified complication: Secondary | ICD-10-CM

## 2020-11-04 DIAGNOSIS — R634 Abnormal weight loss: Secondary | ICD-10-CM

## 2020-11-04 DIAGNOSIS — I10 Essential (primary) hypertension: Secondary | ICD-10-CM | POA: Diagnosis not present

## 2020-11-04 DIAGNOSIS — Z794 Long term (current) use of insulin: Secondary | ICD-10-CM

## 2020-11-04 DIAGNOSIS — E782 Mixed hyperlipidemia: Secondary | ICD-10-CM

## 2020-11-04 LAB — BAYER DCA HB A1C WAIVED: HB A1C (BAYER DCA - WAIVED): 6.9 % (ref <7.0)

## 2020-11-04 NOTE — Progress Notes (Signed)
BP 128/68   Pulse 78   Ht 6\' 1"  (1.854 m)   Wt 136 lb (61.7 kg)   SpO2 98%   BMI 17.94 kg/m    Subjective:   Patient ID: John Reese, male    DOB: June 26, 1950, 70 y.o.   MRN: [id number]  HPI: John Reese is a 70 y.o. male presenting on 11/04/2020 for Medical Management of Chronic Issues, Diabetes, and Weight Loss (Still has appetite)   HPI Type 2 diabetes mellitus Patient comes in today for recheck of his diabetes. Patient has been currently taking Jardiance and Toujeo 10 units and A1c today is 6.9. Patient is not currently on an ACE inhibitor/ARB. Patient has seen an ophthalmologist this year. Patient denies any issues with their feet. The symptom started onset as an adult hypertension and hyperlipidemia ARE RELATED TO DM   Hypertension Patient is currently on no medication except the Jardiance is helping with, and their blood pressure today is 120/68. Patient denies any lightheadedness or dizziness. Patient denies headaches, blurred vision, chest pains, shortness of breath, or weakness. Denies any side effects from medication and is content with current medication.   Hyperlipidemia Patient is coming in for recheck of his hyperlipidemia. The patient is currently taking fish oil and pravastatin. They deny any issues with myalgias or history of liver damage from it. They deny any focal numbness or weakness or chest pain.   Patient comes in complaining of losing weight, this started before the Vania Rea, looks like since April he has lost about 10 pounds, he says he has a good appetite and denies any abdominal issues or nausea or vomiting.  He does have a colonoscopy scheduled for later in the year.  He has never been a smoker and denies any breathing issues.  He has no new lumps or bumps or lymph nodes that we can find anywhere.  Recommend Glucerna  Relevant past medical, surgical, family and social history reviewed and updated as indicated. Interim medical history since our last  visit reviewed. Allergies and medications reviewed and updated.  Review of Systems  Constitutional:  Positive for unexpected weight change. Negative for activity change, appetite change, chills and fever.  Eyes:  Negative for visual disturbance.  Respiratory:  Negative for shortness of breath and wheezing.   Cardiovascular:  Negative for chest pain and leg swelling.  Musculoskeletal:  Negative for back pain and gait problem.  Skin:  Negative for rash.  All other systems reviewed and are negative.  Per HPI unless specifically indicated above   Allergies as of 11/04/2020   No Known Allergies      Medication List        Accurate as of November 04, 2020 11:25 AM. If you have any questions, ask your nurse or doctor.          aspirin EC 81 MG tablet Take 81 mg by mouth daily.   CINNAMON PO Take 1,000 mg by mouth daily.   fish oil-omega-3 fatty acids 1000 MG capsule Take 1 g by mouth daily.   Jardiance 10 MG Tabs tablet Generic drug: empagliflozin TAKE 1 TABLET BY MOUTH DAILY BEFORE BREAKFAST. What changed:  how much to take when to take this   multivitamin tablet Take 1 tablet by mouth daily.   pravastatin 40 MG tablet Commonly known as: PRAVACHOL Take 1 tablet (40 mg total) by mouth daily.   Toujeo SoloStar 300 UNIT/ML Solostar Pen Generic drug: insulin glargine (1 Unit Dial) Inject 10 Units into the  skin at bedtime.   UltiCare Mini Pen Needles 31G X 6 MM Misc Generic drug: Insulin Pen Needle USE AS DIRECTED AT BEDTIME         Objective:   BP 128/68   Pulse 78   Ht 6\' 1"  (1.854 m)   Wt 136 lb (61.7 kg)   SpO2 98%   BMI 17.94 kg/m   Wt Readings from Last 3 Encounters:  11/04/20 136 lb (61.7 kg)  11/01/20 135 lb (61.2 kg)  10/04/20 140 lb (63.5 kg)    Physical Exam Vitals and nursing note reviewed.  Constitutional:      General: He is not in acute distress.    Appearance: He is well-developed. He is not diaphoretic.  Eyes:     General: No  scleral icterus.    Conjunctiva/sclera: Conjunctivae normal.  Neck:     Thyroid: No thyromegaly.  Cardiovascular:     Rate and Rhythm: Normal rate and regular rhythm.     Heart sounds: Normal heart sounds. No murmur heard. Pulmonary:     Effort: Pulmonary effort is normal. No respiratory distress.     Breath sounds: Normal breath sounds. No wheezing.  Abdominal:     General: Abdomen is flat. Bowel sounds are normal. There is no distension.     Tenderness: There is no abdominal tenderness. There is no guarding or rebound.  Musculoskeletal:        General: Normal range of motion.     Cervical back: Neck supple.  Lymphadenopathy:     Cervical: No cervical adenopathy.  Skin:    General: Skin is warm and dry.     Findings: No rash.  Neurological:     Mental Status: He is alert and oriented to person, place, and time.     Coordination: Coordination normal.  Psychiatric:        Behavior: Behavior normal.      Assessment & Plan:   Problem List Items Addressed This Visit       Cardiovascular and Mediastinum   Essential hypertension     Endocrine   Type 2 diabetes mellitus with other specified complication ([place]) - Primary   Relevant Orders   Bayer DCA Hb A1c Waived     Other   Mixed hyperlipidemia   Other Visit Diagnoses     Unexplained weight loss           No palpable lymph nodes, is due for colonoscopy and is going to get that later in the year.  No history of smoking, cannot decipher any alternative for why he is losing weight at this point, recommended Glucerna  Continue other medication, A1c looks good at 6.9 Follow up plan: Return in about 3 months (around 02/04/2021), or if symptoms worsen or fail to improve, for Diabetes recheck.  Counseling provided for all of the vaccine components Orders Placed This Encounter  Procedures   Bayer Hyndman Hb A1c [place] Amazin Pincock, MD [facility] 11/04/2020, 11:25 AM

## 2020-11-10 ENCOUNTER — Telehealth: Payer: Self-pay | Admitting: Family Medicine

## 2020-11-10 NOTE — Telephone Encounter (Signed)
Left message for pt to return call.   Need to schedule with Lannette Donath 10mg 

## 2020-11-11 NOTE — Telephone Encounter (Signed)
Patient cannot afford Jardiance.  He has an appointment to discuss meds with you but not until 12/01/20.  He is out of medication.  What else can he take until his appointment?

## 2020-11-11 NOTE — Telephone Encounter (Signed)
Spouse was upset that she had to schedule an apt with Almyra Free. She ended up scheduling for pt and then ask what about the medication. He is out. Please call back

## 2020-11-16 NOTE — Telephone Encounter (Signed)
Patient needs to be scheduled as a CCM --I scheduled him for 8-5 at 9am if they can make that Jardiance samples left up front for patient to take in the mean time  Please cancel pharmacy clinic appt on 8/11 if they are able to make 8/5 with me

## 2020-11-16 NOTE — Telephone Encounter (Signed)
Left message informing wife of appt with Almyra Free on 8/5 and that samples are up front for Jardiance. Kept 8/11 appt booked since unable to reach pt during time of call.

## 2020-11-25 ENCOUNTER — Other Ambulatory Visit: Payer: Self-pay

## 2020-11-25 ENCOUNTER — Ambulatory Visit (INDEPENDENT_AMBULATORY_CARE_PROVIDER_SITE_OTHER): Payer: PPO | Admitting: Pharmacist

## 2020-11-25 DIAGNOSIS — Z794 Long term (current) use of insulin: Secondary | ICD-10-CM

## 2020-11-25 DIAGNOSIS — E1169 Type 2 diabetes mellitus with other specified complication: Secondary | ICD-10-CM

## 2020-11-25 NOTE — Patient Instructions (Signed)
Visit Information  PATIENT GOALS:  Goals Addressed               This Visit's Progress     Patient Stated     T2DM (pt-stated)        Current Barriers:  Unable to independently afford treatment regimen Suboptimal therapeutic regimen for T2DM  Pharmacist Clinical Goal(s):  Over the next 90 days, patient will verbalize ability to afford treatment regimen maintain control of T2DM as evidenced by improved glycemic control; decreased side effects from medication switch  through collaboration with PharmD and provider.    Interventions: 1:1 collaboration with [name], Fransisca Kaufmann, MD regarding development and update of comprehensive plan of care as evidenced by provider attestation and co-signature Inter-disciplinary care team collaboration (see longitudinal plan of care) Comprehensive medication review performed; medication list updated in electronic medical record  Diabetes: Controlled; current treatment:jardiance '10mg'$ , touejo 10 units at bedtime;  A1c 6.9--controlled Nocturia from jardiance '10mg'$  -- duration 4 months (since initiation of jardiance) Weight loss from [employer] is $400 (coverage gap) He would like to try another medication class Will trial DPP4 class-->tradjenta '5mg'$  daily samples given Will f/u with patient in 2 weeks for response and apply for patient assistance at that time STOP Jardiance CONTINUE Toujeo Current glucose readings: fasting glucose: <130, post prandial glucose: <200 Denies hypoglycemic/hyperglycemic symptoms Discussed meal planning options and Plate method for healthy eating Avoid sugary drinks and desserts Incorporate balanced protein, non starchy veggies, 1 serving of carbohydrate with each meal Increase water intake Increase physical activity as able Current exercise: n/a Educated on new medication (tradjenta) Assessed patient finances. Will enroll in patient assistance at follow up   Patient Goals/Self-Care Activities Over the  next 90 days, patient will:  - take medications as prescribed  Follow Up Plan: Telephone follow up appointment with care management team member scheduled for: 2 weeks         The patient verbalized understanding of instructions, educational materials, and care plan provided today and declined offer to receive copy of patient instructions, educational materials, and care plan.   Telephone follow up appointment with care management team member scheduled for: 12/07/20  Signature Regina Eck, PharmD, BCPS Clinical Pharmacist, [place]  [facility] Phone [phone]

## 2020-11-25 NOTE — Progress Notes (Signed)
Chronic Care Management Pharmacy Note  11/25/2020 Name:  John Reese MRN:  [id number] DOB:  [birth year]  Summary: diabetes management   Recommendations/Changes made from today's visit: Diabetes: Controlled; current treatment:jardiance 90m, touejo 10 units at bedtime;  A1c 6.9--controlled Nocturia from jardiance 132m-- duration 4 months (since initiation of jardiance) Weight loss from [employer] $400 (coverage gap) He would like to try another medication class Will trial DPP4 class-->tradjenta 87m37maily samples given Will f/u with patient in 2 weeks for response and apply for patient assistance at that time STOP Jardiance CONTINUE Toujeo Current glucose readings: fasting glucose: <130, post prandial glucose: <200 Denies hypoglycemic/hyperglycemic symptoms Discussed meal planning options and Plate method for healthy eating Avoid sugary drinks and desserts Incorporate balanced protein, non starchy veggies, 1 serving of carbohydrate with each meal Increase water intake Increase physical activity as able Current exercise: n/a Educated on new medication (tradjenta) Assessed patient finances. Will enroll in patient assistance at follow up  Follow Up Plan: Telephone follow up appointment with care management team member scheduled for: 2 weeks  Subjective: John Reese an [age]o. year old male who is a primary patient of [name], [name]  The CCM team was consulted for assistance with disease management and care coordination needs.    Engaged with patient face to face for initial visit in response to provider referral for pharmacy case management and/or care coordination services.   Consent to Services:  The patient was given information about Chronic Care Management services, agreed to services, and gave verbal consent prior to initiation of services.  Please see initial visit note for detailed documentation.   Patient Care Team: [name], JosFransisca KaufmannMD as PCP - General (Family Medicine) [name] as PCP - Cardiology (Cardiology) [name], [name] as Consulting Physician (Neurology) [name] Pharmacist (Family Medicine)  Objective:  Lab Results  Component Value Date   CREATININE 1.12 11/01/2020   CREATININE 1.28 (H) 08/04/2020   CREATININE 0.96 10/06/2019    Lab Results  Component Value Date   HGBA1C 6.9 11/04/2020   Last diabetic Eye exam: No results found for: HMDIABEYEEXA  Last diabetic Foot exam: No results found for: HMDIABFOOTEX      Component Value Date/Time   CHOL 149 08/04/2020 1001   TRIG 51 08/04/2020 1001   HDL 50 08/04/2020 1001   CHOLHDL 3.0 08/04/2020 1001   CHOLHDL 3.4 09/23/2018 0749   VLDL 20 06/27/2015 0816   LDLCALC 88 08/04/2020 1001   LDLCALC 100 (H) 09/23/2018 0749    Hepatic Function Latest Ref Rng & Units 08/04/2020 10/06/2019 01/27/2019  Total Protein 6.0 - 8.5 g/dL 6.9 6.2 6.3  Albumin 3.7 - 4.7 g/dL 4.7 - -  AST 0 - 40 IU/L _0 ALT 0 - 44 IU/L _1 Alk Phosphatase 44 - 121 IU/L 51 - -  Total Bilirubin 0.0 - 1.2 mg/dL 0.4 0.5 0.4    Lab Results  Component Value Date/Time   TSH 1.39 10/06/2019 09:25 AM   TSH 2.09 02/05/2018 08:18 AM   FREET4 1.0 10/06/2019 09:25 AM   FREET4 1.0 02/05/2018 08:18 AM    CBC Latest Ref Rng & Units 11/01/2020 08/04/2020 09/07/2017  WBC 4.0 - 10.5 K/uL 4.8 4.4 5.9  Hemoglobin 13.0 - 17.0 g/dL 13.3 13.2 10.7(L)  Hematocrit 39.0 - 52.0 % 42.4 40.2 32.0(L)  Platelets 150 - 400 K/uL 147(L) 185 135(L)  Lab Results  Component Value Date/Time   VD25OH 43 10/06/2019 09:25 AM   VD25OH 55 06/03/2017 08:56 AM    Clinical ASCVD: No  The 10-year ASCVD risk score Mikey Bussing DC Jr., et al., 2013) is: 32.2%   Values used to calculate the score:     Age: 47 years     Sex: Male     Is Non-Hispanic African American: No     Diabetic: Yes     Tobacco smoker: No     Systolic Blood Pressure: 742 mmHg     Is BP treated: No     HDL  Cholesterol: 50 mg/dL     Total Cholesterol: 149 mg/dL    Other: (CHADS2VASc if Afib, PHQ9 if depression, MMRC or CAT for COPD, ACT, DEXA)  Social History   Tobacco Use  Smoking Status Never  Smokeless Tobacco Never   BP Readings from Last 3 Encounters:  11/04/20 128/68  11/01/20 (!) 129/57  10/04/20 122/62   Pulse Readings from Last 3 Encounters:  11/04/20 78  11/01/20 81  10/04/20 60   Wt Readings from Last 3 Encounters:  11/04/20 136 lb (61.7 kg)  11/01/20 135 lb (61.2 kg)  10/04/20 140 lb (63.5 kg)    Assessment: Review of patient past medical history, allergies, medications, health status, including review of consultants reports, laboratory and other test data, was performed as part of comprehensive evaluation and provision of chronic care management services.   SDOH:  (Social Determinants of Health) assessments and interventions performed:    CCM Care Plan  No Known Allergies  Medications Reviewed Today     Reviewed by Lavera Guise, [facility] (Pharmacist) on 11/25/20 at 0907  Med List Status: <None>   Medication Order Taking? Sig Documenting Provider Last Dose Status Informant  aspirin EC 81 MG tablet [id number]  Take 81 mg by mouth daily. [provider]  Active Spouse/Significant Other  CINNAMON PO [id number]  Take 1,000 mg by mouth daily. [provider]  Active Spouse/Significant Other  fish oil-omega-3 fatty acids 1000 MG capsule [id number]  Take 1 g by mouth daily. [provider]  Active Spouse/Significant Other  insulin glargine, 1 Unit Dial, (TOUJEO SOLOSTAR) 300 UNIT/ML Solostar Pen [id number]  Inject 10 Units into the skin at bedtime. [name], Fransisca Kaufmann, MD  Active Spouse/Significant Other  Insulin Pen Needle (ULTICARE MINI PEN NEEDLES) 31G X 6 MM MISC [id number]  USE AS DIRECTED AT BEDTIME Cassandria Anger, MD  Active Spouse/Significant Other  JARDIANCE 10 MG TABS tablet [id number]  TAKE 1 TABLET BY MOUTH DAILY BEFORE BREAKFAST.   Patient taking differently: Take 10 mg by mouth daily.   [name], Fransisca Kaufmann, MD  Active   Multiple Vitamin (MULTIVITAMIN) tablet [id number]  Take 1 tablet by mouth daily. [provider]  Active Spouse/Significant Other  pravastatin (PRAVACHOL) 40 MG tablet [id number]  Take 1 tablet (40 mg total) by mouth daily. [name], Fransisca Kaufmann, MD  Active Spouse/Significant Other            Patient Active Problem List   Diagnosis Date Noted   Multiple fractures of ribs, bilateral, initial encounter for closed fracture 09/06/2017   S/P deep brain stimulator placement 04/26/2016   Essential tremor 04/13/2015   Type 2 diabetes mellitus with other specified complication ([place]) 26/94/8546   Essential hypertension 03/24/2014   Mixed hyperlipidemia 03/24/2014   Family history of coronary artery disease 03/24/2014   Trigeminal neuralgia 03/24/2012    Immunization History  Administered Date(s) Administered  Fluad Quad(high Dose 65+) 12/30/2018, 02/05/2020   Influenza, High Dose Seasonal PF 02/07/2015, 02/01/2016, 01/02/2017, 01/24/2018, 01/24/2018   Influenza-Unspecified 02/02/2014   PFIZER(Purple Top)SARS-COV-2 Vaccination 05/10/2019, 05/28/2019, 01/07/2020   Pneumococcal Conjugate-13 04/07/2015   Pneumococcal Polysaccharide-23 07/02/2016, 12/30/2018   Tdap 08/23/2017   Zoster Recombinat (Shingrix) 07/08/2020, 09/16/2020   Zoster, Live 12/02/2012    Conditions to be addressed/monitored: DMII  Care Plan : PHARMD MEDICATION MANAGEMENT  Updates made by Lavera Guise, [place] since 11/25/2020 12:00 AM     Problem: DISEASE PROGRESSION PREVENTION      Long-Range Goal: T2DM   This Visit's Progress: On track  Priority: High  Note:   Current Barriers:  Unable to independently afford treatment regimen Suboptimal therapeutic regimen for T2DM  Pharmacist Clinical Goal(s):  Over the next 90 days, patient will verbalize ability to afford treatment regimen maintain control of T2DM as  evidenced by improved glycemic control; decreased side effects from medication switch  through collaboration with PharmD and provider.    Interventions: 1:1 collaboration with [name], Fransisca Kaufmann, MD regarding development and update of comprehensive plan of care as evidenced by provider attestation and co-signature Inter-disciplinary care team collaboration (see longitudinal plan of care) Comprehensive medication review performed; medication list updated in electronic medical record  Diabetes: Controlled; current treatment:jardiance 59m, touejo 10 units at bedtime;  A1c 6.9--controlled Nocturia from jardiance 131m-- duration 4 months (since initiation of jardiance) Weight loss from [employer] $400 (coverage gap) He would like to try another medication class Will trial DPP4 class-->tradjenta 52m64maily samples given Will f/u with patient in 2 weeks for response and apply for patient assistance at that time STOP Jardiance CONTINUE Toujeo Current glucose readings: fasting glucose: <130, post prandial glucose: <200 Denies hypoglycemic/hyperglycemic symptoms Discussed meal planning options and Plate method for healthy eating Avoid sugary drinks and desserts Incorporate balanced protein, non starchy veggies, 1 serving of carbohydrate with each meal Increase water intake Increase physical activity as able Current exercise: n/a Educated on new medication (tradjenta) Assessed patient finances. Will enroll in patient assistance at follow up   Patient Goals/Self-Care Activities Over the next 90 days, patient will:  - take medications as prescribed  Follow Up Plan: Telephone follow up appointment with care management team member scheduled for: 2 weeks      Medication Assistance: will apply at follow up  Patient's preferred pharmacy is:  [id number] [place]: [phone]:  [phone] [place] -[place] [place]: [phone]: [address] [place]: [phone]: [phone] Up:  Patient agrees to Care Plan and Follow-up.  Plan: Telephone follow up appointment with care management team member scheduled for:  12/07/20  JuliRegina EckarmD, BCPS Clinical Pharmacist, [place] Phone [phone]

## 2020-12-01 ENCOUNTER — Ambulatory Visit: Payer: PPO | Admitting: Pharmacist

## 2020-12-07 ENCOUNTER — Ambulatory Visit: Payer: PPO | Admitting: Pharmacist

## 2020-12-07 DIAGNOSIS — E1169 Type 2 diabetes mellitus with other specified complication: Secondary | ICD-10-CM

## 2020-12-07 DIAGNOSIS — Z794 Long term (current) use of insulin: Secondary | ICD-10-CM

## 2020-12-07 NOTE — Progress Notes (Signed)
Chronic Care Management Pharmacy Note  12/07/2020 Name:  John Reese MRN:  [id number] DOB:  1950/08/11  Summary: T2DM  Recommendations/Changes made from today's visit: Diabetes: Controlled; current treatment:jardiance 617m, touejo 10 units at bedtime;  A1c 6.9--controlled Nocturia from jardiance 17m-- duration 4 months (since initiation of jardiance) Weight loss from [employer] $400 (coverage gap) He would like to try another medication class Tradjenta is not lower Bgs as we would like Will [facility]- 1 tablet by mouth every morning (samples given STOP Jardiance CONTINUE Toujeo Current glucose readings: fasting glucose: <130, post prandial glucose: 171-258 Denies hypoglycemic/hyperglycemic symptoms Discussed meal planning options and Plate method for healthy eating Avoid sugary drinks and desserts Incorporate balanced protein, non starchy veggies, 1 serving of carbohydrate with each meal Increase water intake Increase physical activity as able Current exercise: n/a Educated on new medication (tradjenta) Assessed patient finances. Will enroll in patient assistance at follow up  Follow Up Plan: Telephone follow up appointment with care management team member scheduled for: 2 weeks  Subjective: John Reese an 70 25o. year old male who is a primary patient of [name], [name]  The CCM team was consulted for assistance with disease management and care coordination needs.    Engaged with patient by telephone for follow up visit in response to provider referral for pharmacy case management and/or care coordination services.   Consent to Services:  The patient was given information about Chronic Care Management services, agreed to services, and gave verbal consent prior to initiation of services.  Please see initial visit note for detailed documentation.   Patient Care Team: [name], JosFransisca KaufmannD as PCP - General  (Family Medicine) [name] as PCP - Cardiology (Cardiology) [name], [name] as Consulting Physician (Neurology) PruLavera [facility]PHProm[facility] Pharmacist (Family Medicine)  Objective:  Lab Results  Component Value Date   CREATININE 1.12 11/01/2020   CREATININE 1.28 (H) 08/04/2020   CREATININE 0.96 10/06/2019    Lab Results  Component Value Date   HGBA1C 6.9 11/04/2020   Last diabetic Eye exam: No results found for: HMDIABEYEEXA  Last diabetic Foot exam: No results found for: HMDIABFOOTEX      Component Value Date/Time   CHOL 149 08/04/2020 1001   TRIG 51 08/04/2020 1001   HDL 50 08/04/2020 1001   CHOLHDL 3.0 08/04/2020 1001   CHOLHDL 3.4 09/23/2018 0749   VLDL 20 06/27/2015 0816   LDLCALC 88 08/04/2020 1001   LDLCALC 100 (H) 09/23/2018 0749    Hepatic Function Latest Ref Rng & Units 08/04/2020 10/06/2019 01/27/2019  Total Protein 6.0 - 8.5 g/dL 6.9 6.2 6.3  Albumin 3.7 - 4.7 g/dL 4.7 - -  AST 0 - 40 IU/L _0 ALT 0 - 44 IU/L _1 Alk Phosphatase 44 - 121 IU/L 51 - -  Total Bilirubin 0.0 - 1.2 mg/dL 0.4 0.5 0.4    Lab Results  Component Value Date/Time   TSH 1.39 10/06/2019 09:25 AM   TSH 2.09 02/05/2018 08:18 AM   FREET4 1.0 10/06/2019 09:25 AM   FREET4 1.0 02/05/2018 08:18 AM    CBC Latest Ref Rng & Units 11/01/2020 08/04/2020 09/07/2017  WBC 4.0 - 10.5 K/uL 4.8 4.4 5.9  Hemoglobin 13.0 - 17.0 g/dL 13.3 13.2 10.7(L)  Hematocrit 39.0 - 52.0 % 42.4 40.2 32.0(L)  Platelets 150 - 400 K/uL 147(L) 185 135(L)    Lab  Results  Component Value Date/Time   VD25OH 43 10/06/2019 09:25 AM   VD25OH 55 06/03/2017 08:56 AM    Clinical ASCVD: No  The 10-year ASCVD risk score Mikey Bussing DC Jr., et al., 2013) is: 34.4%   Values used to calculate the score:     Age: 49 years     Sex: Male     Is Non-Hispanic African American: No     Diabetic: Yes     Tobacco smoker: No     Systolic Blood Pressure: 952 mmHg     Is BP treated: No     HDL Cholesterol: 50 mg/dL      Total Cholesterol: 149 mg/dL    Other: (CHADS2VASc if Afib, PHQ9 if depression, MMRC or CAT for COPD, ACT, DEXA)  Social History   Tobacco Use  Smoking Status Never  Smokeless Tobacco Never   BP Readings from Last 3 Encounters:  11/04/20 128/68  11/01/20 (!) 129/57  10/04/20 122/62   Pulse Readings from Last 3 Encounters:  11/04/20 78  11/01/20 81  10/04/20 60   Wt Readings from Last 3 Encounters:  11/04/20 136 lb (61.7 kg)  11/01/20 135 lb (61.2 kg)  10/04/20 140 lb (63.5 kg)    Assessment: Review of patient past medical history, allergies, medications, health status, including review of consultants reports, laboratory and other test data, was performed as part of comprehensive evaluation and provision of chronic care management services.   SDOH:  (Social Determinants of Health) assessments and interventions performed:    CCM Care Plan  No Known Allergies  Medications Reviewed Today     Reviewed by Lavera Guise, [facility] (Pharmacist) on 12/21/20 at 36  Med List Status: <None>   Medication Order Taking? Sig Documenting Provider Last Dose Status Informant  aspirin EC 81 MG tablet [id number]  Take 81 mg by mouth daily. [provider]  Active Spouse/Significant Other  CINNAMON PO [id number]  Take 1,000 mg by mouth daily. [provider]  Active Spouse/Significant Other  fish oil-omega-3 fatty acids 1000 MG capsule [id number]  Take 1 g by mouth daily. [provider]  Active Spouse/Significant Other  insulin glargine, 1 Unit Dial, (TOUJEO SOLOSTAR) 300 UNIT/ML Solostar Pen [id number]  Inject 10 Units into the skin at bedtime. [name], Fransisca Kaufmann, MD  Active Spouse/Significant Other  Insulin Pen Needle (ULTICARE MINI PEN NEEDLES) 31G X 6 MM MISC [id number]  USE AS DIRECTED AT BEDTIME Cassandria Anger, MD  Active Spouse/Significant Other    Discontinued 12/21/20 1103 (Change in therapy)            Med Note (Teesha Ohm D   Fri Nov 25, 2020  9:45  AM) SAMPLES GIVEN  Multiple Vitamin (MULTIVITAMIN) tablet [id number]  Take 1 tablet by mouth daily. [provider]  Active Spouse/Significant Other  pravastatin (PRAVACHOL) 40 MG tablet [id number]  Take 1 tablet (40 mg total) by mouth daily. [name], Fransisca Kaufmann, MD  Active Spouse/Significant Other            Patient Active Problem List   Diagnosis Date Noted   Multiple fractures of ribs, bilateral, initial encounter for closed fracture 09/06/2017   S/P deep brain stimulator placement 04/26/2016   Essential tremor 04/13/2015   Type 2 diabetes mellitus with other specified complication ([place]) 57/32/2025   Essential hypertension 03/24/2014   Mixed hyperlipidemia 03/24/2014   Family history of coronary artery disease 03/24/2014   Trigeminal neuralgia 03/24/2012    Immunization History  Administered Date(s) Administered  Fluad Quad(high Dose 65+) 12/30/2018, 02/05/2020   Influenza, High Dose Seasonal PF 02/07/2015, 02/01/2016, 01/02/2017, 01/24/2018, 01/24/2018   Influenza-Unspecified 02/02/2014   PFIZER(Purple Top)SARS-COV-2 Vaccination 05/10/2019, 05/28/2019, 01/07/2020   Pneumococcal Conjugate-13 04/07/2015   Pneumococcal Polysaccharide-23 07/02/2016, 12/30/2018   Tdap 08/23/2017   Zoster Recombinat (Shingrix) 07/08/2020, 09/16/2020   Zoster, Live 12/02/2012    Conditions to be addressed/monitored: DMII  Care Plan : PHARMD MEDICATION MANAGEMENT  Updates made by ,  D, RPH since 12/21/2020 12:00 AM     Problem: DISEASE PROGRESSION PREVENTION      Long-Range Goal: T2DM   Recent Progress: On track  Priority: High  Note:   Current Barriers:  Unable to independently afford treatment regimen Suboptimal therapeutic regimen for T2DM  Pharmacist Clinical Goal(s):  Over the next 90 days, patient will verbalize ability to afford treatment regimen maintain control of T2DM as evidenced by improved glycemic control; decreased side effects from medication switch   through collaboration with PharmD and provider.    Interventions: 1:1 collaboration with [name], Joshua A, MD regarding development and update of comprehensive plan of care as evidenced by provider attestation and co-signature Inter-disciplinary care team collaboration (see longitudinal plan of care) Comprehensive medication review performed; medication list updated in electronic medical record  Diabetes: Controlled; current treatment:jardiance 10mg, touejo 10 units at bedtime;  A1c 6.9--controlled Nocturia from jardiance 10mg -- duration 4 months (since initiation of jardiance) Weight loss from jardiance Jardiance is $400 (coverage gap) He would like to try another medication class Tradjenta is not lower Bgs as we would like Will transition patient to Glyxambi 10/5mg -- 1 tablet by mouth every morning (samples given STOP Jardiance CONTINUE Toujeo Current glucose readings: fasting glucose: <130, post prandial glucose: 171-258 Denies hypoglycemic/hyperglycemic symptoms Discussed meal planning options and Plate method for healthy eating Avoid sugary drinks and desserts Incorporate balanced protein, non starchy veggies, 1 serving of carbohydrate with each meal Increase water intake Increase physical activity as able Current exercise: n/a Educated on new medication (tradjenta) Assessed patient finances. Will enroll in patient assistance at follow up   Patient Goals/Self-Care Activities Over the next 90 days, patient will:  - take medications as prescribed  Follow Up Plan: Telephone follow up appointment with care management team member scheduled for: 2 weeks      Medication Assistance:  WILL APPLY ONCE MEDICATION CHOICE IS KNOWN  Patient's preferred pharmacy is:  [id number] [place], [place] - [address] [place] [address] Phone: [phone] Fax: [phone]  [facility] - [place], [place] - [address] [place] [address] Phone: [phone] Fax: [phone]  [id number] - [place], [place] - [address] [place] [address] Phone: [phone] Fax: [phone]   Follow Up:  Patient agrees to Care Plan and Follow-up.  Plan: Telephone follow up appointment with care management team member scheduled for:  12/21/20     [place] , PharmD, BCPS Clinical Pharmacist, [facility] [place]  II Phone [phone]          

## 2020-12-21 ENCOUNTER — Ambulatory Visit: Payer: PPO | Admitting: Pharmacist

## 2020-12-21 DIAGNOSIS — Z794 Long term (current) use of insulin: Secondary | ICD-10-CM

## 2020-12-21 DIAGNOSIS — E1169 Type 2 diabetes mellitus with other specified complication: Secondary | ICD-10-CM

## 2020-12-21 NOTE — Progress Notes (Signed)
Chronic Care Management Pharmacy Note  12/21/2020 Name:  John Reese MRN:  [id number] DOB:  January 17, 1951  Summary: T2DM  Recommendations/Changes made from today's visit: Diabetes: Controlled; current treatment: Glyxambi 10/21m, touejo 10 units at bedtime;  A1c 6.9--controlled Jardiance stopped due to reported weight loss  Tradjenta did not lower Bgs enough [facility]-- 1 tablet by mouth every morning (samples given-tolerating well) CONTINUE Toujeo Current glucose readings: fasting glucose: 88-104, post prandial glucose: 160-180 Denies hypoglycemic/hyperglycemic symptoms Discussed meal planning options and Plate method for healthy eating Avoid sugary drinks and desserts Incorporate balanced protein, non starchy veggies, 1 serving of carbohydrate with each meal Increase water intake Increase physical activity as able Current exercise: n/a Educated on new medication (glyxambi) Assessed patient finances. Application sent on 8/2/6/94[place] for [place]tern  Follow Up Plan: Telephone follow up appointment with care management team member scheduled for: 2 weeks  Subjective: John HARARIs an [age].o. year old male who is a primary patient of John Reese.  The CCM team was consulted for assistance with disease management and care coordination needs.    Engaged with patient by telephone for follow up visit in response to provider referral for pharmacy case management and/or care coordination services.   Consent to Services:  The patient was given information about Chronic Care Management services, agreed to services, and gave verbal consent prior to initiation of services.  Please see initial visit note for detailed documentation.   Patient Care Team: John Reese as PCP - General (Family Medicine) SkJerline PainMD as PCP - Cardiology (Cardiology) [name], ReEustace QuailDO as Consulting Physician  (Neurology) PrLavera GuiseRPChildren[facility] Pharmacist (Family Medicine)  Objective:  Lab Results  Component Value Date   CREATININE 1.12 11/01/2020   CREATININE 1.28 (H) 08/04/2020   CREATININE 0.96 10/06/2019    Lab Results  Component Value Date   HGBA1C 6.9 11/04/2020   Last diabetic Eye exam: No results found for: HMDIABEYEEXA  Last diabetic Foot exam: No results found for: HMDIABFOOTEX      Component Value Date/Time   CHOL 149 08/04/2020 1001   TRIG 51 08/04/2020 1001   HDL 50 08/04/2020 1001   CHOLHDL 3.0 08/04/2020 1001   CHOLHDL 3.4 09/23/2018 0749   VLDL 20 06/27/2015 0816   LDLCALC 88 08/04/2020 1001   LDLCALC 100 (H) 09/23/2018 0749    Hepatic Function Latest Ref Rng & Units 08/04/2020 10/06/2019 01/27/2019  Total Protein 6.0 - 8.5 g/dL 6.9 6.2 6.3  Albumin 3.7 - 4.7 g/dL 4.7 - -  AST 0 - 40 IU/L 17 14 14   ALT 0 - 44 IU/L 18 14 16   Alk Phosphatase 44 - 121 IU/L 51 - -  Total Bilirubin 0.0 - 1.2 mg/dL 0.4 0.5 0.4    Lab Results  Component Value Date/Time   TSH 1.39 10/06/2019 09:25 AM   TSH 2.09 02/05/2018 08:18 AM   FREET4 1.0 10/06/2019 09:25 AM   FREET4 1.0 02/05/2018 08:18 AM    CBC Latest Ref Rng & Units 11/01/2020 08/04/2020 09/07/2017  WBC 4.0 - 10.5 K/uL 4.8 4.4 5.9  Hemoglobin 13.0 - 17.0 g/dL 13.3 13.2 10.7(L)  Hematocrit 39.0 - 52.0 % 42.4 40.2 32.0(L)  Platelets 150 - 400 K/uL 147(L) 185 135(L)    Lab Results  Component Value Date/Time   VD25OH 43 10/06/2019 09:25 AM   VD25OH 55 06/03/2017 08:56 AM    Clinical  ASCVD: No  The 10-year ASCVD risk score Mikey Bussing DC Jr., et al., 2013) is: 34.4%   Values used to calculate the score:     Age: 11 years     Sex: Male     Is Non-Hispanic African American: No     Diabetic: Yes     Tobacco smoker: No     Systolic Blood Pressure: 789 mmHg     Is BP treated: No     HDL Cholesterol: 50 mg/dL     Total Cholesterol: 149 mg/dL    Other: (CHADS2VASc if Afib, PHQ9 if depression, MMRC or CAT for COPD, ACT,  DEXA)  Social History   Tobacco Use  Smoking Status Never  Smokeless Tobacco Never   BP Readings from Last 3 Encounters:  11/04/20 128/68  11/01/20 (!) 129/57  10/04/20 122/62   Pulse Readings from Last 3 Encounters:  11/04/20 78  11/01/20 81  10/04/20 60   Wt Readings from Last 3 Encounters:  11/04/20 136 lb (61.7 kg)  11/01/20 135 lb (61.2 kg)  10/04/20 140 lb (63.5 kg)    Assessment: Review of patient past medical history, allergies, medications, health status, including review of consultants reports, laboratory and other test data, was performed as part of comprehensive evaluation and provision of chronic care management services.   SDOH:  (Social Determinants of Health) assessments and interventions performed:    CCM Care Plan  No Known Allergies  Medications Reviewed Today     Reviewed by Lavera Guise, [facility] (Pharmacist) on 12/21/20 at 74  Med List Status: <None>   Medication Order Taking? Sig Documenting Provider Last Dose Status Informant  aspirin EC 81 MG tablet [id number]  Take 81 mg by mouth daily. [provider]  Active Spouse/Significant Other  CINNAMON PO [id number]  Take 1,000 mg by mouth daily. [provider]  Active Spouse/Significant Other  fish oil-omega-3 fatty acids 1000 MG capsule [id number]  Take 1 g by mouth daily. [provider]  Active Spouse/Significant Other  insulin glargine, 1 Unit Dial, (TOUJEO SOLOSTAR) 300 UNIT/ML Solostar Pen [id number]  Inject 10 Units into the skin at bedtime. [name], Fransisca Kaufmann, MD  Active Spouse/Significant Other  Insulin Pen Needle (ULTICARE MINI PEN NEEDLES) 31G X 6 MM MISC [id number]  USE AS DIRECTED AT BEDTIME Cassandria Anger, MD  Active Spouse/Significant Other    Discontinued 12/21/20 1103 (Change in therapy)            Med Note (Stephano Arrants D   Fri Nov 25, 2020  9:45 AM) SAMPLES GIVEN  Multiple Vitamin (MULTIVITAMIN) tablet [id number]  Take 1 tablet by mouth daily. [provider]  Active Spouse/Significant Other  pravastatin (PRAVACHOL) 40 MG tablet [id number]  Take 1 tablet (40 mg total) by mouth daily. [name], Fransisca Kaufmann, MD  Active Spouse/Significant Other            Patient Active Problem List   Diagnosis Date Noted   Multiple fractures of ribs, bilateral, initial encounter for closed fracture 09/06/2017   S/P deep brain stimulator placement 04/26/2016   Essential tremor 04/13/2015   Type 2 diabetes mellitus with other specified complication ([place]) 53/97/6734   Essential hypertension 03/24/2014   Mixed hyperlipidemia 03/24/2014   Family history of coronary artery disease 03/24/2014   Trigeminal neuralgia 03/24/2012    Immunization History  Administered Date(s) Administered   Fluad Quad(high Dose 65+) 12/30/2018, 02/05/2020   Influenza, High Dose Seasonal PF 02/07/2015, 02/01/2016, 01/02/2017, 01/24/2018, 01/24/2018   Influenza-Unspecified 02/02/2014  PFIZER(Purple Top)SARS-COV-2 Vaccination 05/10/2019, 05/28/2019, 01/07/2020   Pneumococcal Conjugate-13 04/07/2015   Pneumococcal Polysaccharide-23 07/02/2016, 12/30/2018   Tdap 08/23/2017   Zoster Recombinat (Shingrix) 07/08/2020, 09/16/2020   Zoster, Live 12/02/2012    Conditions to be addressed/monitored: DMII  Care Plan : PHARMD MEDICATION MANAGEMENT  Updates made by Lavera Guise, [place] since 12/28/2020 12:00 AM     Problem: DISEASE PROGRESSION PREVENTION      Long-Range Goal: T2DM   Recent Progress: On track  Priority: High  Note:   Current Barriers:  Unable to independently afford treatment regimen Suboptimal therapeutic regimen for T2DM  Pharmacist Clinical Goal(s):  Over the next 90 days, patient will verbalize ability to afford treatment regimen maintain control of T2DM as evidenced by improved glycemic control; decreased side effects from medication switch  through collaboration with PharmD and provider.    Interventions: 1:1 collaboration with [name],  Fransisca Kaufmann, MD regarding development and update of comprehensive plan of care as evidenced by provider attestation and co-signature Inter-disciplinary care team collaboration (see longitudinal plan of care) Comprehensive medication review performed; medication list updated in electronic medical record  Diabetes: Controlled; current treatment: Glyxambi 10/45m, touejo 10 units at bedtime;  A1c 6.9--controlled Jardiance stopped due to reported weight loss  Tradjenta did not lower Bgs enough [facility]-- 1 tablet by mouth every morning (samples given-tolerating well) CONTINUE Toujeo Current glucose readings: fasting glucose: 88-104, post prandial glucose: 160-180 Denies hypoglycemic/hyperglycemic symptoms Discussed meal planning options and Plate method for healthy eating Avoid sugary drinks and desserts Incorporate balanced protein, non starchy veggies, 1 serving of carbohydrate with each meal Increase water intake Increase physical activity as able Current exercise: n/a Educated on new medication (glyxambi) Assessed patient finances. Application sent on 8/9[place]2/01[place]   Patient Goals/Self-Care Activities Over the next 90 days, patient will:  - take medications as prescribed  Follow Up Plan: Telephone follow up appointment with care management team member scheduled for: 2 weeks      Medication Assistance: [place] for [place]  medication assistance program. in process.  Anticipated assistance start date TBD.  See plan of care for additional detail.  Patient's [facility]  [id number]: [phone]: [phone] [place]one: [phone]: [address] [address] [address]one: [phone]: [phone] Up:  Patient agrees to Care Plan and Follow-up.  Plan: Telephone follow up appointment with care management team member scheduled for:  2-3 WEEKS  JulRegina EckharmD, BCPS Clinical Pharmacist, [place] Phone [phone]

## 2020-12-21 NOTE — Patient Instructions (Signed)
Visit Information  PATIENT GOALS:  Goals Addressed               This Visit's Progress     Patient Stated     T2DM (pt-stated)        Current Barriers:  Unable to independently afford treatment regimen Suboptimal therapeutic regimen for T2DM  Pharmacist Clinical Goal(s):  Over the next 90 days, patient will verbalize ability to afford treatment regimen maintain control of T2DM as evidenced by improved glycemic control; decreased side effects from medication switch  through collaboration with PharmD and provider.    Interventions: 1:1 collaboration with [name], Fransisca Kaufmann, MD regarding development and update of comprehensive plan of care as evidenced by provider attestation and co-signature Inter-disciplinary care team collaboration (see longitudinal plan of care) Comprehensive medication review performed; medication list updated in electronic medical record  Diabetes: Controlled; current treatment:jardiance '10mg'$ , touejo 10 units at bedtime;  A1c 6.9--controlled Nocturia from jardiance '10mg'$  -- duration 4 months (since initiation of jardiance) Weight loss from [employer] is $400 (coverage gap) He would like to try another medication class Tradjenta is not lower Bgs as we would like Will transition patient to [facility] 10/'5mg'$  -- 1 tablet by mouth every morning (samples given STOP Jardiance CONTINUE Toujeo Current glucose readings: fasting glucose: <130, post prandial glucose: 171-258 Denies hypoglycemic/hyperglycemic symptoms Discussed meal planning options and Plate method for healthy eating Avoid sugary drinks and desserts Incorporate balanced protein, non starchy veggies, 1 serving of carbohydrate with each meal Increase water intake Increase physical activity as able Current exercise: n/a Educated on new medication (tradjenta) Assessed patient finances. Will enroll in patient assistance at follow up   Patient Goals/Self-Care Activities Over the next 90  days, patient will:  - take medications as prescribed  Follow Up Plan: Telephone follow up appointment with care management team member scheduled for: 2 weeks         The patient verbalized understanding of instructions, educational materials, and care plan provided today and declined offer to receive copy of patient instructions, educational materials, and care plan.   Telephone follow up appointment with care management team member scheduled for: 12/21/20  Signature Regina Eck, PharmD, BCPS Clinical Pharmacist, [place]  II Phone [phone]

## 2020-12-28 NOTE — Patient Instructions (Signed)
Visit Information  PATIENT GOALS:  Goals Addressed               This Visit's Progress     Patient Stated     T2DM (pt-stated)        Current Barriers:  Unable to independently afford treatment regimen Suboptimal therapeutic regimen for T2DM  Pharmacist Clinical Goal(s):  Over the next 90 days, patient will verbalize ability to afford treatment regimen maintain control of T2DM as evidenced by improved glycemic control; decreased side effects from medication switch  through collaboration with PharmD and provider.    Interventions: 1:1 collaboration with [name], Fransisca Kaufmann, MD regarding development and update of comprehensive plan of care as evidenced by provider attestation and co-signature Inter-disciplinary care team collaboration (see longitudinal plan of care) Comprehensive medication review performed; medication list updated in electronic medical record  Diabetes: Controlled; current treatment: Glyxambi 10/'5mg'$ , touejo 10 units at bedtime;  A1c 6.9--controlled Jardiance stopped due to reported weight loss  Tradjenta did not lower Bgs enough Transitioned to [facility] 10/'5mg'$  -- 1 tablet by mouth every morning (samples given-tolerating well) CONTINUE Toujeo Current glucose readings: fasting glucose: 88-104, post prandial glucose: 160-180 Denies hypoglycemic/hyperglycemic symptoms Discussed meal planning options and Plate method for healthy eating Avoid sugary drinks and desserts Incorporate balanced protein, non starchy veggies, 1 serving of carbohydrate with each meal Increase water intake Increase physical activity as able Current exercise: n/a Educated on new medication (glyxambi) Assessed patient finances. Application sent on [phone] for [place] patient [place] for qtern   Patient Goals/Self-Care Activities Over the next 90 days, patient will:  - take medications as prescribed  Follow Up Plan: Telephone follow up appointment with care management team  member scheduled for: 2 weeks         The patient verbalized understanding of instructions, educational materials, and care plan provided today and declined offer to receive copy of patient instructions, educational materials, and care plan.   Telephone follow up appointment with care management team member scheduled for: 2-3 WEEKS  Signature Regina Eck, PharmD, BCPS Clinical Pharmacist, [place]  II Phone [phone]

## 2021-01-06 NOTE — Progress Notes (Signed)
Received notification from [place]&ME regarding approval for ONGLYZA '5MG'$ . Patient assistance approved from 01/02/21 to 04/22/21.  MEDICATION WILL SHIP TO PT'S HOME  Phone: [phone]

## 2021-01-09 ENCOUNTER — Ambulatory Visit (AMBULATORY_SURGERY_CENTER): Payer: PPO

## 2021-01-09 ENCOUNTER — Other Ambulatory Visit: Payer: Self-pay

## 2021-01-09 VITALS — Ht 73.0 in | Wt 138.2 lb

## 2021-01-09 DIAGNOSIS — Z86010 Personal history of colonic polyps: Secondary | ICD-10-CM

## 2021-01-09 DIAGNOSIS — Z800 Family history of malignant neoplasm of digestive organs: Secondary | ICD-10-CM

## 2021-01-09 MED ORDER — PLENVU 140 G PO SOLR: 1.0000 | ORAL | 0 refills | Status: DC

## 2021-01-09 NOTE — Progress Notes (Signed)
Pre visit completed via phone call; Patient verified name, DOB, and address; No egg or soy allergy known to patient  No issues known to pt with past sedation with any surgeries or procedures Patient denies ever being told they had issues or difficulty with intubation  No FH of Malignant Hyperthermia Pt is not on diet pills Pt is not on  home 02  Pt is not on blood thinners  Pt denies issues with constipation  No A fib or A flutter  EMMI video via MyChart  COVID 19 guidelines implemented in PV today with Pt and RN  Pt is fully vaccinated for Covid x 2 + booster; Coupon given to pt in PV today and NO PA's for preps discussed with pt in PV today  Discussed with pt there will be an out-of-pocket cost for prep and that varies from $0 to 70 +  dollars  Due to the COVID-19 pandemic we are asking patients to follow certain guidelines.  Pt aware of COVID protocols and [facility] guidelines

## 2021-01-10 ENCOUNTER — Encounter: Payer: Self-pay | Admitting: Gastroenterology

## 2021-01-13 ENCOUNTER — Ambulatory Visit (INDEPENDENT_AMBULATORY_CARE_PROVIDER_SITE_OTHER): Payer: PPO | Admitting: Pharmacist

## 2021-01-13 DIAGNOSIS — Z794 Long term (current) use of insulin: Secondary | ICD-10-CM

## 2021-01-13 MED ORDER — QTERN 10-5 MG PO TABS: 1.0000 | ORAL_TABLET | Freq: Every morning | ORAL | 4 refills | Status: DC

## 2021-01-13 NOTE — Progress Notes (Signed)
Chronic Care Management Pharmacy Note  01/13/2021 Name:  John Reese MRN:  [id number] DOB:  10/16/1950  Summary: T2DM  Recommendations/Changes made from today's visit: Diabetes: Controlled; current treatment: Glyxambi 10/52m, touejo 10 units at bedtime;  A1c 6.9--controlled Jardiance stopped due to reported weight loss  Tradjenta did not lower Bgs enough CONTINUE Glyxambi 10/567m-- 1 tablet by mouth every morning (samples given-tolerating well) CONTINUE Toujeo Current glucose readings: fasting glucose: 88-104, post prandial glucose: 160-180 Denies hypoglycemic/hyperglycemic symptoms Discussed meal planning options and Plate method for healthy eating Avoid sugary drinks and desserts Incorporate balanced protein, non starchy veggies, 1 serving of carbohydrate with each meal Increase water intake Increase physical activity as able Current exercise: n/a Educated on new medication (glyxambi) Assessed patient finances. Application sent on 8/9/7/98or [place] patient assistance application for qtern 1092/1JH--ERDEYCXKo patient assistance pharmacy--this will replace glyxambi (easier to get)--az&ME STILL HASN'T RECEIVED NEW APPLICATION--WILL SEND AGAIN ONCE BACK IN OFFICE  Follow Up Plan: Telephone follow up appointment with care management team member scheduled for: 6 weeks   Subjective: John WESTRUPs an [age].o. year old male who is a primary patient of [name], JoFransisca KaufmannMD.  The CCM team was consulted for assistance with disease management and care coordination needs.    Engaged with patient by telephone for follow up visit in response to provider referral for pharmacy case management and/or care coordination services.   Consent to Services:  The patient was given information about Chronic Care Management services, agreed to services, and gave verbal consent prior to initiation of services.  Please see initial visit note for detailed documentation.   Patient Care  Team: [name], JoFransisca KaufmannMD as PCP - General (Family Medicine) SkJerline PainMD as PCP - Cardiology (Cardiology) [name], ReEustace QuailDO as Consulting Physician (Neurology) PrLavera GuiseRPEndsocopy [facility] Pharmacist (Family Medicine)  Objective:  Lab Results  Component Value Date   CREATININE 1.12 11/01/2020   CREATININE 1.28 (H) 08/04/2020   CREATININE 0.96 10/06/2019    Lab Results  Component Value Date   HGBA1C 6.9 11/04/2020   Last diabetic Eye exam: No results found for: HMDIABEYEEXA  Last diabetic Foot exam: No results found for: HMDIABFOOTEX      Component Value Date/Time   CHOL 149 08/04/2020 1001   TRIG 51 08/04/2020 1001   HDL 50 08/04/2020 1001   CHOLHDL 3.0 08/04/2020 1001   CHOLHDL 3.4 09/23/2018 0749   VLDL 20 06/27/2015 0816   LDLCALC 88 08/04/2020 1001   LDLCALC 100 (H) 09/23/2018 0749    Hepatic Function Latest Ref Rng & Units 08/04/2020 10/06/2019 01/27/2019  Total Protein 6.0 - 8.5 g/dL 6.9 6.2 6.3  Albumin 3.7 - 4.7 g/dL 4.7 - -  AST 0 - 40 IU/L 17 14 14   ALT 0 - 44 IU/L 18 14 16   Alk Phosphatase 44 - 121 IU/L 51 - -  Total Bilirubin 0.0 - 1.2 mg/dL 0.4 0.5 0.4    Lab Results  Component Value Date/Time   TSH 1.39 10/06/2019 09:25 AM   TSH 2.09 02/05/2018 08:18 AM   FREET4 1.0 10/06/2019 09:25 AM   FREET4 1.0 02/05/2018 08:18 AM    CBC Latest Ref Rng & Units 11/01/2020 08/04/2020 09/07/2017  WBC 4.0 - 10.5 K/uL 4.8 4.4 5.9  Hemoglobin 13.0 - 17.0 g/dL 13.3 13.2 10.7(L)  Hematocrit 39.0 - 52.0 % 42.4 40.2 32.0(L)  Platelets 150 - 400 K/uL 147(L) 185 135(L)    Lab Results  Component Value Date/Time   VD25OH 43 10/06/2019 09:25 AM   VD25OH 55 06/03/2017 08:56 AM    Clinical ASCVD: No  The 10-year ASCVD risk score (Arnett DK, et al., 2019) is: 34.4%   Values used to calculate the score:     Age: 70 years     Sex: Male     Is Non-Hispanic African American: No     Diabetic: Yes     Tobacco smoker: No     Systolic Blood Pressure: 481 mmHg     Is  BP treated: No     HDL Cholesterol: 50 mg/dL     Total Cholesterol: 149 mg/dL    Other: (CHADS2VASc if Afib, PHQ9 if depression, MMRC or CAT for COPD, ACT, DEXA)  Social History   Tobacco Use  Smoking Status Never  Smokeless Tobacco Never   BP Readings from Last 3 Encounters:  11/04/20 128/68  11/01/20 (!) 129/57  10/04/20 122/62   Pulse Readings from Last 3 Encounters:  11/04/20 78  11/01/20 81  10/04/20 60   Wt Readings from Last 3 Encounters:  01/09/21 138 lb 3.2 oz (62.7 kg)  11/04/20 136 lb (61.7 kg)  11/01/20 135 lb (61.2 kg)    Assessment: Review of patient past medical history, allergies, medications, health status, including review of consultants reports, laboratory and other test data, was performed as part of comprehensive evaluation and provision of chronic care management services.   SDOH:  (Social Determinants of Health) assessments and interventions performed:    CCM Care Plan  No Known Allergies  Medications Reviewed Today     Reviewed by Lavera Guise, [facility] (Pharmacist) on 01/13/21 at 1540  Med List Status: <None>   Medication Order Taking? Sig Documenting Provider Last Dose Status Informant  aspirin EC 81 MG tablet [id number] No Take 81 mg by mouth daily. [provider] Taking Active Spouse/Significant Other  CINNAMON PO [id number] No Take 1,000 mg by mouth daily. [provider] Taking Active Spouse/Significant Other  Empagliflozin-linaGLIPtin (GLYXAMBI) 10-5 MG TABS [id number] No Take 1 tablet by mouth every morning. [provider] Taking Active            Med Note Blanca Friend, Shelanda Duvall D   Wed Dec 21, 2020 11:08 AM) Samples given  fish oil-omega-3 fatty acids 1000 MG capsule [id number] No Take 1 g by mouth daily. [provider] Taking Active Spouse/Significant Other  insulin glargine, 1 Unit Dial, (TOUJEO SOLOSTAR) 300 UNIT/ML Solostar Pen [id number] No Inject 10 Units into the skin at bedtime. [name], Fransisca Kaufmann, MD  Taking Active Spouse/Significant Other  Insulin Pen Needle (ULTICARE MINI PEN NEEDLES) 31G X 6 MM MISC [id number] No USE AS DIRECTED AT BEDTIME Cassandria Anger, MD Taking Active Spouse/Significant Other  Multiple Vitamin (MULTIVITAMIN) tablet [id number] No Take 1 tablet by mouth daily. [provider] Taking Active Spouse/Significant Other  PEG-KCl-NaCl-NaSulf-Na Asc-C (PLENVU) 140 g SOLR [id number]  Take 1 kit by mouth as directed. Mauri Pole, MD  Active   pravastatin (PRAVACHOL) 40 MG tablet [id number] No Take 1 tablet (40 mg total) by mouth daily. [name], Fransisca Kaufmann, MD Taking Active Spouse/Significant Other            Patient Active Problem List   Diagnosis Date Noted   Multiple fractures of ribs, bilateral, initial encounter for closed fracture 09/06/2017   S/P deep brain stimulator placement 04/26/2016   Essential tremor 04/13/2015   Type 2 diabetes mellitus with other specified complication ([place]) 59/16/3846   Essential  hypertension 03/24/2014   Mixed hyperlipidemia 03/24/2014   Family history of coronary artery disease 03/24/2014   Trigeminal neuralgia 03/24/2012    Immunization History  Administered Date(s) Administered   Fluad Quad(high Dose 65+) 12/30/2018, 02/05/2020   Influenza, High Dose Seasonal PF 02/07/2015, 02/01/2016, 01/02/2017, 01/24/2018, 01/24/2018   Influenza-Unspecified 02/02/2014   PFIZER(Purple Top)SARS-COV-2 Vaccination 05/10/2019, 05/28/2019, 01/07/2020   Pneumococcal Conjugate-13 04/07/2015   Pneumococcal Polysaccharide-23 07/02/2016, 12/30/2018   Tdap 08/23/2017   Zoster Recombinat (Shingrix) 07/08/2020, 09/16/2020   Zoster, Live 12/02/2012    Conditions to be addressed/monitored: DMII  Care Plan : PHARMD MEDICATION MANAGEMENT  Updates made by Lavera Guise, [place] since 01/13/2021 12:00 AM     Problem: DISEASE PROGRESSION PREVENTION      Long-Range Goal: T2DM   Recent Progress: On track  Priority: High  Note:    Current Barriers:  Unable to independently afford treatment regimen Suboptimal therapeutic regimen for T2DM  Pharmacist Clinical Goal(s):  Over the next 90 days, patient will verbalize ability to afford treatment regimen maintain control of T2DM as evidenced by improved glycemic control; decreased side effects from medication switch  through collaboration with PharmD and provider.    Interventions: 1:1 collaboration with [name], Fransisca Kaufmann, MD regarding development and update of comprehensive plan of care as evidenced by provider attestation and co-signature Inter-disciplinary care team collaboration (see longitudinal plan of care) Comprehensive medication review performed; medication list updated in electronic medical record  Diabetes: Controlled; current treatment: Glyxambi 10/69m, touejo 10 units at bedtime;  A1c 6.9--controlled Jardiance stopped due to reported weight loss  Tradjenta did not lower Bgs enough [facility]-- 1 tablet by mouth every morning (samples given-tolerating well) CONTINUE Toujeo Current glucose readings: fasting glucose: 88-104, post prandial glucose: 160-180 Denies hypoglycemic/hyperglycemic symptoms Discussed meal planning options and Plate method for healthy eating Avoid sugary drinks and desserts Incorporate balanced protein, non starchy veggies, 1 serving of carbohydrate with each meal Increase water intake Increase physical activity as able Current exercise: n/a Educated on new medication (glyxambi) Assessed patient finances. Application sent on 8/0/8/67or [place] patient assistance application for qtern 1061/9JK--DTOIZTIWo patient assistance pharmacy--this will replace glyxambi (easier to get)   Patient Goals/Self-Care Activities Over the next 90 days, patient will:  - take medications as prescribed  Follow Up Plan: Telephone follow up appointment with care management team member scheduled for: 6 weeks      Medication  Assistance: Application for QTERN/[place]  medication assistance program. in process.  Anticipated assistance start date TBD.  See plan of care for additional detail.  Patient's preferred pharmacy is:  [id number] [place] [address]one: [phone]: [phone] [address]one: [phone]: [phone] [place] [address]one: [phone]: [address]one: [phone]: [phone] Up:  Patient agrees to Care Plan and Follow-up.  Plan: Telephone follow up appointment with care management team member scheduled for:  4-6 WEEKS  JulRegina EckharmD, BCPS Clinical Pharmacist, [place] Phone [phone]

## 2021-01-17 NOTE — Patient Instructions (Signed)
Visit Information  PATIENT GOALS:  Goals Addressed               This Visit's Progress     Patient Stated     T2DM (pt-stated)        Current Barriers:  Unable to independently afford treatment regimen Suboptimal therapeutic regimen for T2DM  Pharmacist Clinical Goal(s):  Over the next 90 days, patient will verbalize ability to afford treatment regimen maintain control of T2DM as evidenced by improved glycemic control; decreased side effects from medication switch  through collaboration with PharmD and provider.    Interventions: 1:1 collaboration with [name], Fransisca Kaufmann, MD regarding development and update of comprehensive plan of care as evidenced by provider attestation and co-signature Inter-disciplinary care team collaboration (see longitudinal plan of care) Comprehensive medication review performed; medication list updated in electronic medical record  Diabetes: Controlled; current treatment: Glyxambi 10/5mg , touejo 10 units at bedtime;  A1c 6.9--controlled Jardiance stopped due to reported weight loss  Tradjenta did not lower Bgs enough CONTINUE Glyxambi 10/5mg  -- 1 tablet by mouth every morning (samples given-tolerating well) CONTINUE Toujeo Current glucose readings: fasting glucose: 88-104, post prandial glucose: 160-180 Denies hypoglycemic/hyperglycemic symptoms Discussed meal planning options and Plate method for healthy eating Avoid sugary drinks and desserts Incorporate balanced protein, non starchy veggies, 1 serving of carbohydrate with each meal Increase water intake Increase physical activity as able Current exercise: n/a Educated on new medication (glyxambi) Assessed patient finances. Application sent on 05/25/46 for [place]&me patient assistance application for qtern 10/5mg --escribed to patient assistance pharmacy--this will replace glyxambi (easier to get)--az&ME STILL HASN'T RECEIVED NEW APPLICATION--WILL SEND AGAIN ONCE BACK IN OFFICE   Patient  Goals/Self-Care Activities Over the next 90 days, patient will:  - take medications as prescribed  Follow Up Plan: Telephone follow up appointment with care management team member scheduled for: 6 weeks         The patient verbalized understanding of instructions, educational materials, and care plan provided today and declined offer to receive copy of patient instructions, educational materials, and care plan.   Telephone follow up appointment with care management team member scheduled for: 4-6 WEEKS  [id number]$, PharmD, BCPS Clinical Pharmacist, [place]  II Phone [phone]

## 2021-01-20 ENCOUNTER — Telehealth: Payer: PPO

## 2021-01-20 DIAGNOSIS — E1169 Type 2 diabetes mellitus with other specified complication: Secondary | ICD-10-CM | POA: Diagnosis not present

## 2021-01-20 DIAGNOSIS — Z794 Long term (current) use of insulin: Secondary | ICD-10-CM

## 2021-01-23 ENCOUNTER — Other Ambulatory Visit: Payer: Self-pay

## 2021-01-23 ENCOUNTER — Ambulatory Visit (AMBULATORY_SURGERY_CENTER): Payer: PPO | Admitting: Gastroenterology

## 2021-01-23 ENCOUNTER — Encounter: Payer: Self-pay | Admitting: Gastroenterology

## 2021-01-23 VITALS — BP 97/57 | HR 75 | Temp 97.3°F | Resp 14 | Ht 73.0 in | Wt 138.0 lb

## 2021-01-23 DIAGNOSIS — Z800 Family history of malignant neoplasm of digestive organs: Secondary | ICD-10-CM

## 2021-01-23 DIAGNOSIS — D122 Benign neoplasm of ascending colon: Secondary | ICD-10-CM | POA: Diagnosis not present

## 2021-01-23 DIAGNOSIS — Z86010 Personal history of colonic polyps: Secondary | ICD-10-CM

## 2021-01-23 DIAGNOSIS — E119 Type 2 diabetes mellitus without complications: Secondary | ICD-10-CM | POA: Diagnosis not present

## 2021-01-23 DIAGNOSIS — D120 Benign neoplasm of cecum: Secondary | ICD-10-CM | POA: Diagnosis not present

## 2021-01-23 DIAGNOSIS — E7800 Pure hypercholesterolemia, unspecified: Secondary | ICD-10-CM | POA: Diagnosis not present

## 2021-01-23 MED ORDER — SODIUM CHLORIDE 0.9 % IV SOLN: 500.0000 mL | Freq: Once | INTRAVENOUS | Status: DC

## 2021-01-23 NOTE — Progress Notes (Signed)
[facility] History and Physical   Primary Care Physician:  [name], Fransisca Kaufmann, MD   Reason for Procedure:  History of adenomatous colon polyps  Plan:    Surveillance colonoscopy with possible interventions as needed     HPI: John Reese is a very pleasant 70 y.o. male here for colonoscopy. Denies any nausea, vomiting, abdominal pain, melena or bright red blood per rectum  The risks and benefits as well as alternatives of endoscopic procedure(s) have been discussed and reviewed. All questions answered. The patient agrees to proceed.    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    Tip   Diabetes (HCC)    on meds   Dyslipidemia    Hyperlipidemia    on meds   Tremor    has a deep brain stimulator in place to treat -   Trigeminal neuralgia    Right, V2 distribution    Past Surgical History:  Procedure Laterality Date   COLONOSCOPY  2021   FINGER AMPUTATION Right 1968   RIGHT pointer finger end   INNER EAR SURGERY Right    Ear drum repair   POLYPECTOMY  2021   TA's   PULSE GENERATOR IMPLANT Bilateral 03/30/2016   Procedure: BILATERAL PLACEMENT OF IMPLANTABLE PULSE GENERATOR;  Surgeon: Erline Levine, MD;  Location: [place];  Service: Neurosurgery;  Laterality: Bilateral;  BILATERAL PLACEMENT OF IMPLANTABLE PULSE GENERATOR   SUBTHALAMIC STIMULATOR BATTERY REPLACEMENT N/A 11/01/2020   Procedure: Implantable pulse generator battery change, Right and left chest;  Surgeon: Erline Levine, MD;  Location: [place];  Service: Neurosurgery;  Laterality: N/A;  3C/RM 21   SUBTHALAMIC STIMULATOR INSERTION Bilateral 03/22/2016   Procedure: BILATERAL DEEP BRAIN STIMULATOR PLACEMENT WITH STARFIX WITH DR. Carles Collet;  Surgeon: Erline Levine, MD;  Location: [place];  Service: Neurosurgery;  Laterality: Bilateral;    Prior to Admission medications   Medication Sig  Start Date End Date Taking? Authorizing Provider  CINNAMON PO Take 1,000 mg by mouth daily.   Yes [provider]  Empagliflozin-linaGLIPtin (GLYXAMBI) 10-5 MG TABS Take 1 tablet by mouth every morning.   Yes [provider]  fish oil-omega-3 fatty acids 1000 MG capsule Take 1 g by mouth daily.   Yes [provider]  insulin glargine, 1 Unit Dial, (TOUJEO SOLOSTAR) 300 UNIT/ML Solostar Pen Inject 10 Units into the skin at bedtime. 08/04/20  Yes [name], Fransisca Kaufmann, MD  Insulin Pen Needle (ULTICARE MINI PEN NEEDLES) 31G X 6 MM MISC USE AS DIRECTED AT BEDTIME 06/04/19  Yes Nida, Marella Chimes, MD  Multiple Vitamin (MULTIVITAMIN) tablet Take 1 tablet by mouth daily.   Yes [provider]  pravastatin (PRAVACHOL) 40 MG tablet Take 1 tablet (40 mg total) by mouth daily. 08/04/20  Yes [name], Fransisca Kaufmann, MD  aspirin EC 81 MG tablet Take 81 mg by mouth daily.    [provider]  Dapagliflozin-sAXagliptin (QTERN) 10-5 MG TABS Take 1 tablet by mouth in the morning. Cancel onglyza RX. 01/13/21   [name], Fransisca Kaufmann, MD    Current Outpatient Medications  Medication Sig Dispense Refill   CINNAMON PO Take 1,000 mg by mouth daily.     Empagliflozin-linaGLIPtin (GLYXAMBI) 10-5 MG TABS Take 1 tablet by mouth every morning.     fish oil-omega-3 fatty acids 1000 MG capsule Take 1 g by mouth daily.     insulin glargine, 1 Unit  Dial, (TOUJEO SOLOSTAR) 300 UNIT/ML Solostar Pen Inject 10 Units into the skin at bedtime. 3 mL 3   Insulin Pen Needle (ULTICARE MINI PEN NEEDLES) 31G X 6 MM MISC USE AS DIRECTED AT BEDTIME 100 each 1   Multiple Vitamin (MULTIVITAMIN) tablet Take 1 tablet by mouth daily.     pravastatin (PRAVACHOL) 40 MG tablet Take 1 tablet (40 mg total) by mouth daily. 90 tablet 3   aspirin EC 81 MG tablet Take 81 mg by mouth daily.     Dapagliflozin-sAXagliptin (QTERN) 10-5 MG TABS Take 1 tablet by mouth in the morning. Cancel onglyza RX. 90 tablet 4    Current Facility-Administered Medications  Medication Dose Route Frequency Provider Last Rate Last Admin   0.9 %  sodium chloride infusion  500 mL Intravenous Once Mauri Pole, MD        Allergies as of 01/23/2021   (No Known Allergies)    Family History  Problem Relation Age of Onset   Colon cancer Mother 38   Stroke Mother    Colon polyps Mother 66   Stroke Father    Peripheral vascular disease Sister    Healthy Sister    Heart attack Brother    Healthy Brother    Healthy Brother    Healthy Brother    Healthy Brother    Healthy Daughter    Healthy Son    Esophageal cancer Neg Hx    Stomach cancer Neg Hx    Rectal cancer Neg Hx     Social History   Socioeconomic History   Marital status: Married    Spouse name: Not on file   Number of children: 2   Years of education: HS   Highest education level: 12th grade  Occupational History   Occupation: Works in Optometrist: [employer]    Comment: RETIRED  Tobacco Use   Smoking status: Never   Smokeless tobacco: Never  Vaping Use   Vaping Use: Never used  Substance and Sexual Activity   Alcohol use: No   Drug use: No   Sexual activity: Not on file  Other Topics Concern   Not on file  Social History Narrative   Patient is right handed.   Patient drinks 2 cups coffee daily.   Social Determinants of Health   Financial Resource Strain: Not on file  Food Insecurity: Not on file  Transportation Needs: Not on file  Physical Activity: Not on file  Stress: Not on file  Social Connections: Not on file  Intimate Partner Violence: Not on file    Review of Systems:  All other review of systems negative except as mentioned in the HPI.  Physical Exam: Vital signs in last 24 hours: BP 114/71 (BP Location: Right Arm, Patient Position: Sitting, Cuff Size: Normal)   Pulse 98   Temp (!) 97.3 F (36.3 C) (Temporal)   Ht 6\' 1"  (1.854 m)   Wt 138 lb (62.6 kg)   SpO2 100%   BMI 18.21 kg/m      General:   Alert, NAD Lungs:  Clear .   Heart:  Regular rate and rhythm Abdomen:  Soft, nontender and nondistended. Neuro/Psych:  Alert and cooperative. Normal mood and affect. A and O x 3  Reviewed labs, radiology imaging, old records and pertinent past GI work up  Patient is appropriate for planned procedure(s) and anesthesia in an ambulatory setting   K. Denzil Magnuson , MD [phone]

## 2021-01-23 NOTE — Progress Notes (Signed)
Report given to PACU, vss 

## 2021-01-23 NOTE — Progress Notes (Signed)
Vitals-CW  Pt's states no medical or surgical changes since previsit or office visit. 

## 2021-01-23 NOTE — Op Note (Signed)
[place] Patient Name: John Reese Procedure Date: 01/23/2021 8:48 AM MRN: [id number] Endoscopist: Mauri Pole , MD Age: 70 Referring MD:  Date of Birth: 06/28/1950 Gender: Male Account #: [id number] Procedure:                Colonoscopy Indications:              High risk colon cancer surveillance: Personal                            history of colonic polyps, High risk colon cancer                            surveillance: Personal history of adenoma (10 mm or                            greater in size), High risk colon cancer                            surveillance: Personal history of multiple (3 or                            more) adenomas Medicines:                Monitored Anesthesia Care Procedure:                Pre-Anesthesia Assessment:                           - Prior to the procedure, a History and Physical                            was performed, and patient medications and                            allergies were reviewed. The patient's tolerance of                            previous anesthesia was also reviewed. The risks                            and benefits of the procedure and the sedation                            options and risks were discussed with the patient.                            All questions were answered, and informed consent                            was obtained. Prior Anticoagulants: The patient has                            taken no previous anticoagulant or antiplatelet  agents. ASA Grade Assessment: II - A patient with                            mild systemic disease. After reviewing the risks                            and benefits, the patient was deemed in                            satisfactory condition to undergo the procedure.                           After obtaining informed consent, the colonoscope                            was passed under direct vision. Throughout the                             procedure, the patient's blood pressure, pulse, and                            oxygen saturations were monitored continuously. The                            Olympus PCF-H190DL (#[id number]) Colonoscope was                            introduced through the anus and advanced to the the                            cecum, identified by appendiceal orifice and                            ileocecal valve. The colonoscopy was performed                            without difficulty. The patient tolerated the                            procedure well. The quality of the bowel                            preparation was adequate. The ileocecal valve,                            appendiceal orifice, and rectum were photographed. Scope In: 8:53:17 AM Scope Out: 9:11:30 AM Scope Withdrawal Time: 0 hours 12 minutes 2 seconds  Total Procedure Duration: 0 hours 18 minutes 13 seconds  Findings:                 The perianal and digital rectal examinations were                            normal.  Three sessile polyps were found in the ascending                            colon and cecum. The polyps were 3 to 6 mm in size.                            These polyps were removed with a cold snare.                            Resection and retrieval were complete.                           A few small-mouthed diverticula were found in the                            sigmoid colon.                           Non-bleeding external and internal hemorrhoids were                            found during retroflexion. The hemorrhoids were                            medium-sized. Complications:            No immediate complications. Estimated Blood Loss:     Estimated blood loss was minimal. Impression:               - Three 3 to 6 mm polyps in the ascending colon and                            in the cecum, removed with a cold snare. Resected                            and  retrieved.                           - Diverticulosis in the sigmoid colon.                           - Non-bleeding external and internal hemorrhoids. Recommendation:           - Patient has a contact number available for                            emergencies. The signs and symptoms of potential                            delayed complications were discussed with the                            patient. Return to normal activities tomorrow.  Written discharge instructions were provided to the                            patient.                           - Resume previous diet.                           - Continue present medications.                           - Await pathology results.                           - Repeat colonoscopy in 3 years for surveillance                            based on pathology results. Mauri Pole, MD 01/23/2021 9:16:29 AM This report has been signed electronically.

## 2021-01-23 NOTE — Patient Instructions (Signed)
YOU HAD AN ENDOSCOPIC PROCEDURE TODAY AT [facility]:   Refer to the procedure report that was given to you for any specific questions about what was found during the examination.  If the procedure report does not answer your questions, please call your gastroenterologist to clarify.  If you requested that your care partner not be given the details of your procedure findings, then the procedure report has been included in a sealed envelope for you to review at your convenience later.  **Handouts given on Polyps, Hemorrhoids and Diverticulosis**  YOU SHOULD EXPECT: Some feelings of bloating in the abdomen. Passage of more gas than usual.  Walking can help get rid of the air that was put into your GI tract during the procedure and reduce the bloating. If you had a lower endoscopy (such as a colonoscopy or flexible sigmoidoscopy) you may notice spotting of blood in your stool or on the toilet paper. If you underwent a bowel prep for your procedure, you may not have a normal bowel movement for a few days.  Please Note:  You might notice some irritation and congestion in your nose or some drainage.  This is from the oxygen used during your procedure.  There is no need for concern and it should clear up in a day or so.  SYMPTOMS TO REPORT IMMEDIATELY:  Following lower endoscopy (colonoscopy or flexible sigmoidoscopy):  Excessive amounts of blood in the stool  Significant tenderness or worsening of abdominal pains  Swelling of the abdomen that is new, acute  Fever of 100F or higher  For urgent or emergent issues, a gastroenterologist can be reached at any hour by calling [phone]. Do not use MyChart messaging for urgent concerns.    DIET:  We do recommend a small meal at first, but then you may proceed to your regular diet.  Drink plenty of fluids but you should avoid alcoholic beverages for 24 hours.  ACTIVITY:  You should plan to take it easy for the rest of today and you  should NOT DRIVE or use heavy machinery until tomorrow (because of the sedation medicines used during the test).    FOLLOW UP: Our staff will call the number listed on your records 48-72 hours following your procedure to check on you and address any questions or concerns that you may have regarding the information given to you following your procedure. If we do not reach you, we will leave a message.  We will attempt to reach you two times.  During this call, we will ask if you have developed any symptoms of COVID 19. If you develop any symptoms (ie: fever, flu-like symptoms, shortness of breath, cough etc.) before then, please call [phone].  If you test positive for Covid 19 in the 2 weeks post procedure, please call and report this information to [place].    If any biopsies were taken you will be contacted by phone or by letter within the next 1-3 weeks.  Please call us at [phone] if you have not heard about the biopsies in 3 weeks.    SIGNATURES/CONFIDENTIALITY: You and/or your care partner have signed paperwork which will be entered into your electronic medical record.  These signatures attest to the fact that that the information above on your After Visit Summary has been reviewed and is understood.  Full responsibility of the confidentiality of this discharge information lies with you and/or your care-partner.

## 2021-01-23 NOTE — Progress Notes (Signed)
Called to room to assist during endoscopic procedure.  Patient ID and intended procedure confirmed with present staff. Received instructions for my participation in the procedure from the performing physician.  

## 2021-01-25 ENCOUNTER — Telehealth: Payer: Self-pay | Admitting: *Deleted

## 2021-01-25 NOTE — Telephone Encounter (Signed)
  Follow up Call-  Call back number 01/23/2021 09/10/2019  Post procedure Call Back phone  # - [phone]  Permission to leave phone message Yes Yes  Some recent data might be hidden     Patient questions:  Do you have a fever, pain , or abdominal swelling? No. Pain Score  0 *  Have you tolerated food without any problems? Yes.    Have you been able to return to your normal activities? Yes.    Do you have any questions about your discharge instructions: Diet   No. Medications  No. Follow up visit  No.  Do you have questions or concerns about your Care? No.  Actions: * If pain score is 4 or above: No action needed, pain <4.  Have you developed a fever since your procedure? no  2.   Have you had an respiratory symptoms (SOB or cough) since your procedure? no  3.   Have you tested positive for COVID 19 since your procedure no  4.   Have you had any family members/close contacts diagnosed with the COVID 19 since your procedure?  no   If yes to any of these questions please route to Joylene John, RN and Joella Prince, RN

## 2021-01-26 ENCOUNTER — Telehealth: Payer: Self-pay | Admitting: Family Medicine

## 2021-01-26 NOTE — Progress Notes (Signed)
Received notification from [employer] regarding approval for QTERN 10/5. Patient assistance approved from 01/23/21 to 04/22/21.  Phone: [phone]

## 2021-01-26 NOTE — Telephone Encounter (Signed)
Wife aware

## 2021-01-26 NOTE — Telephone Encounter (Signed)
Samples left up front Will call patient assistance to see what is going on

## 2021-01-31 ENCOUNTER — Encounter: Payer: Self-pay | Admitting: Gastroenterology

## 2021-02-02 ENCOUNTER — Other Ambulatory Visit: Payer: Self-pay

## 2021-02-02 ENCOUNTER — Other Ambulatory Visit: Payer: PPO

## 2021-02-02 DIAGNOSIS — I10 Essential (primary) hypertension: Secondary | ICD-10-CM

## 2021-02-02 DIAGNOSIS — E1169 Type 2 diabetes mellitus with other specified complication: Secondary | ICD-10-CM

## 2021-02-02 DIAGNOSIS — E782 Mixed hyperlipidemia: Secondary | ICD-10-CM | POA: Diagnosis not present

## 2021-02-02 DIAGNOSIS — Z794 Long term (current) use of insulin: Secondary | ICD-10-CM | POA: Diagnosis not present

## 2021-02-02 LAB — BAYER DCA HB A1C WAIVED: HB A1C (BAYER DCA - WAIVED): 6.7 % — ABNORMAL HIGH (ref 4.8–5.6)

## 2021-02-03 LAB — LIPID PANEL
Chol/HDL Ratio: 3.1 ratio (ref 0.0–5.0)
Cholesterol, Total: 159 mg/dL (ref 100–199)
HDL: 52 mg/dL (ref >39)
LDL Chol Calc (NIH): 94 mg/dL (ref 0–99)
Triglycerides: 65 mg/dL (ref 0–149)
VLDL Cholesterol Cal: 13 mg/dL (ref 5–40)

## 2021-02-03 LAB — CBC WITH DIFFERENTIAL/PLATELET
Basophils Absolute: 0 10*3/uL (ref 0.0–0.2)
Basos: 1 %
EOS (ABSOLUTE): 0.1 10*3/uL (ref 0.0–0.4)
Eos: 3 %
Hematocrit: 43.1 % (ref 37.5–51.0)
Hemoglobin: 14.2 g/dL (ref 13.0–17.7)
Immature Grans (Abs): 0 10*3/uL (ref 0.0–0.1)
Immature Granulocytes: 0 %
Lymphocytes Absolute: 1.7 10*3/uL (ref 0.7–3.1)
Lymphs: 39 %
MCH: 28.6 pg (ref 26.6–33.0)
MCHC: 32.9 g/dL (ref 31.5–35.7)
MCV: 87 fL (ref 79–97)
Monocytes Absolute: 0.4 10*3/uL (ref 0.1–0.9)
Monocytes: 9 %
Neutrophils Absolute: 2.2 10*3/uL (ref 1.4–7.0)
Neutrophils: 48 %
Platelets: 156 10*3/uL (ref 150–450)
RBC: 4.96 x10E6/uL (ref 4.14–5.80)
RDW: 13.1 % (ref 11.6–15.4)
WBC: 4.4 10*3/uL (ref 3.4–10.8)

## 2021-02-03 LAB — PSA, TOTAL AND FREE
PSA, Free Pct: 19.3 %
PSA, Free: 0.54 ng/mL
Prostate Specific Ag, Serum: 2.8 ng/mL (ref 0.0–4.0)

## 2021-02-03 LAB — CMP14+EGFR
ALT: 17 IU/L (ref 0–44)
AST: 14 IU/L (ref 0–40)
Albumin/Globulin Ratio: 2.1 (ref 1.2–2.2)
Albumin: 4.8 g/dL — ABNORMAL HIGH (ref 3.7–4.7)
Alkaline Phosphatase: 53 IU/L (ref 44–121)
BUN/Creatinine Ratio: 26 — ABNORMAL HIGH (ref 10–24)
BUN: 30 mg/dL — ABNORMAL HIGH (ref 8–27)
Bilirubin Total: 0.5 mg/dL (ref 0.0–1.2)
CO2: 25 mmol/L (ref 20–29)
Calcium: 10.3 mg/dL — ABNORMAL HIGH (ref 8.6–10.2)
Chloride: 99 mmol/L (ref 96–106)
Creatinine, Ser: 1.16 mg/dL (ref 0.76–1.27)
Globulin, Total: 2.3 g/dL (ref 1.5–4.5)
Glucose: 108 mg/dL — ABNORMAL HIGH (ref 70–99)
Potassium: 5.5 mmol/L — ABNORMAL HIGH (ref 3.5–5.2)
Sodium: 138 mmol/L (ref 134–144)
Total Protein: 7.1 g/dL (ref 6.0–8.5)
eGFR: 67 mL/min/{1.73_m2} (ref >59)

## 2021-02-06 ENCOUNTER — Other Ambulatory Visit: Payer: Self-pay

## 2021-02-06 ENCOUNTER — Encounter: Payer: Self-pay | Admitting: Family Medicine

## 2021-02-06 ENCOUNTER — Telehealth: Payer: Self-pay | Admitting: Family Medicine

## 2021-02-06 ENCOUNTER — Ambulatory Visit (INDEPENDENT_AMBULATORY_CARE_PROVIDER_SITE_OTHER): Payer: PPO | Admitting: Family Medicine

## 2021-02-06 VITALS — BP 136/77 | HR 71 | Ht 73.0 in | Wt 137.0 lb

## 2021-02-06 DIAGNOSIS — I10 Essential (primary) hypertension: Secondary | ICD-10-CM | POA: Diagnosis not present

## 2021-02-06 DIAGNOSIS — E782 Mixed hyperlipidemia: Secondary | ICD-10-CM | POA: Diagnosis not present

## 2021-02-06 DIAGNOSIS — E1169 Type 2 diabetes mellitus with other specified complication: Secondary | ICD-10-CM | POA: Diagnosis not present

## 2021-02-06 DIAGNOSIS — Z794 Long term (current) use of insulin: Secondary | ICD-10-CM | POA: Diagnosis not present

## 2021-02-06 DIAGNOSIS — Z23 Encounter for immunization: Secondary | ICD-10-CM | POA: Diagnosis not present

## 2021-02-06 NOTE — Progress Notes (Signed)
BP 136/77   Pulse 71   Ht _0  (1.854 m)   Wt 137 lb (62.1 kg)   SpO2 97%   BMI 18.07 kg/m    Subjective:   Patient ID: John Reese, male    DOB: 1951-01-15, 70 y.o.   MRN: [id number]  HPI: John Reese is a 70 y.o. male presenting on 02/06/2021 for Medical Management of Chronic Issues and Diabetes   HPI Type 2 diabetes mellitus Patient comes in today for recheck of his diabetes. Patient has been currently taking Glyxambi and Toujeo, A1c is good at 6.7 and denies any hypoglycemic episodes. Patient is not currently on an ACE inhibitor/ARB. Patient has seen an ophthalmologist this year. Patient denies any issues with their feet. The symptom started onset as an adult hypertension and hyperlipidemia ARE RELATED TO DM   Hypertension Patient is currently on diet control, and their blood pressure today is 136/77. Patient denies any lightheadedness or dizziness. Patient denies headaches, blurred vision, chest pains, shortness of breath, or weakness. Denies any side effects from medication and is content with current medication.   Hyperlipidemia Patient is coming in for recheck of his hyperlipidemia. The patient is currently taking pravastatin and fish oils. They deny any issues with myalgias or history of liver damage from it. They deny any focal numbness or weakness or chest pain.   Relevant past medical, surgical, family and social history reviewed and updated as indicated. Interim medical history since our last visit reviewed. Allergies and medications reviewed and updated.  Review of Systems  Constitutional:  Negative for chills and fever.  Eyes:  Negative for visual disturbance.  Respiratory:  Negative for shortness of breath and wheezing.   Cardiovascular:  Negative for chest pain and leg swelling.  Musculoskeletal:  Negative for back pain and gait problem.  Skin:  Negative for rash.  Neurological:  Negative for dizziness, weakness and light-headedness.  All other systems  reviewed and are negative.  Per HPI unless specifically indicated above   Allergies as of 02/06/2021   No Known Allergies      Medication List        Accurate as of February 06, 2021  8:27 AM. If you have any questions, ask your nurse or doctor.          aspirin EC 81 MG tablet Take 81 mg by mouth daily.   CINNAMON PO Take 1,000 mg by mouth daily.   fish oil-omega-3 fatty acids 1000 MG capsule Take 1 g by mouth daily.   Glyxambi 10-5 MG Tabs Generic drug: Empagliflozin-linaGLIPtin Take 1 tablet by mouth every morning.   multivitamin tablet Take 1 tablet by mouth daily.   pravastatin 40 MG tablet Commonly known as: PRAVACHOL Take 1 tablet (40 mg total) by mouth daily.   Qtern 10-5 MG Tabs Generic drug: Dapagliflozin-sAXagliptin Take 1 tablet by mouth in the morning. Cancel onglyza RX.   Toujeo SoloStar 300 UNIT/ML Solostar Pen Generic drug: insulin glargine (1 Unit Dial) Inject 10 Units into the skin at bedtime.   UltiCare Mini Pen Needles 31G X 6 MM Misc Generic drug: Insulin Pen Needle USE AS DIRECTED AT BEDTIME         Objective:   BP 136/77   Pulse 71   Ht _1  (1.854 m)   Wt 137 lb (62.1 kg)   SpO2 97%   BMI 18.07 kg/m   Wt Readings from Last 3 Encounters:  02/06/21 137 lb (62.1 kg)  01/23/21 138 lb (  62.6 kg)  01/09/21 138 lb 3.2 oz (62.7 kg)    Physical Exam Vitals and nursing note reviewed.  Constitutional:      General: He is not in acute distress.    Appearance: He is well-developed. He is not diaphoretic.  Eyes:     General: No scleral icterus.    Conjunctiva/sclera: Conjunctivae normal.  Neck:     Thyroid: No thyromegaly.  Cardiovascular:     Rate and Rhythm: Normal rate and regular rhythm.     Heart sounds: Normal heart sounds. No murmur heard. Pulmonary:     Effort: Pulmonary effort is normal. No respiratory distress.     Breath sounds: Normal breath sounds. No wheezing.  Musculoskeletal:        General: Normal range  of motion.     Cervical back: Neck supple.  Lymphadenopathy:     Cervical: No cervical adenopathy.  Skin:    General: Skin is warm and dry.     Findings: No rash.  Neurological:     Mental Status: He is alert and oriented to person, place, and time.     Coordination: Coordination normal.  Psychiatric:        Behavior: Behavior normal.    Results for orders placed or performed in visit on 02/02/21  CBC with Differential/Platelet  Result Value Ref Range   WBC 4.4 3.4 - 10.8 x10E3/uL   RBC 4.96 4.14 - 5.80 x10E6/uL   Hemoglobin 14.2 13.0 - 17.7 g/dL   Hematocrit 43.1 37.5 - 51.0 %   MCV 87 79 - 97 fL   MCH 28.6 26.6 - 33.0 pg   MCHC 32.9 31.5 - 35.7 g/dL   RDW 13.1 11.6 - 15.4 %   Platelets 156 150 - 450 x10E3/uL   Neutrophils 48 Not Estab. %   Lymphs 39 Not Estab. %   Monocytes 9 Not Estab. %   Eos 3 Not Estab. %   Basos 1 Not Estab. %   Neutrophils Absolute 2.2 1.4 - 7.0 x10E3/uL   Lymphocytes Absolute 1.7 0.7 - 3.1 x10E3/uL   Monocytes Absolute 0.4 0.1 - 0.9 x10E3/uL   EOS (ABSOLUTE) 0.1 0.0 - 0.4 x10E3/uL   Basophils Absolute 0.0 0.0 - 0.2 x10E3/uL   Immature Granulocytes 0 Not Estab. %   Immature Grans (Abs) 0.0 0.0 - 0.1 x10E3/uL  CMP14+EGFR  Result Value Ref Range   Glucose 108 (H) 70 - 99 mg/dL   BUN 30 (H) 8 - 27 mg/dL   Creatinine, Ser 1.16 0.76 - 1.27 mg/dL   eGFR 67 >59 mL/min/1.73   BUN/Creatinine Ratio 26 (H) 10 - 24   Sodium 138 134 - 144 mmol/L   Potassium 5.5 (H) 3.5 - 5.2 mmol/L   Chloride 99 96 - 106 mmol/L   CO2 25 20 - 29 mmol/L   Calcium 10.3 (H) 8.6 - 10.2 mg/dL   Total Protein 7.1 6.0 - 8.5 g/dL   Albumin 4.8 (H) 3.7 - 4.7 g/dL   Globulin, Total 2.3 1.5 - 4.5 g/dL   Albumin/Globulin Ratio 2.1 1.2 - 2.2   Bilirubin Total 0.5 0.0 - 1.2 mg/dL   Alkaline Phosphatase 53 44 - 121 IU/L   AST 14 0 - 40 IU/L   ALT 17 0 - 44 IU/L  Lipid panel  Result Value Ref Range   Cholesterol, Total 159 100 - 199 mg/dL   Triglycerides 65 0 - 149 mg/dL   HDL  52 >39 mg/dL   VLDL Cholesterol Cal 13 5 -  40 mg/dL   LDL Chol Calc (NIH) 94 0 - 99 mg/dL   Chol/HDL Ratio 3.1 0.0 - 5.0 ratio  Bayer DCA Hb A1c Waived  Result Value Ref Range   HB A1C (BAYER DCA - WAIVED) 6.7 (H) 4.8 - 5.6 %  PSA, total and free  Result Value Ref Range   Prostate Specific Ag, Serum 2.8 0.0 - 4.0 ng/mL   PSA, Free 0.54 N/A ng/mL   PSA, Free Pct 19.3 %    Assessment & Plan:   Problem List Items Addressed This Visit       Cardiovascular and Mediastinum   Essential hypertension     Endocrine   Type 2 diabetes mellitus with other specified complication ([place]) - Primary     Other   Mixed hyperlipidemia    Continue current medication, A1c looks good and blood pressure looks good, no changes. Follow up plan: Return in about 3 months (around 05/09/2021), or if symptoms worsen or fail to improve, for Physical exam.  Counseling provided for all of the vaccine components No orders of the defined types were placed in this encounter.   Caryl Pina, MD [facility] 02/06/2021, 8:27 AM

## 2021-02-06 NOTE — Telephone Encounter (Signed)
Per Julie's note in September, pt is supposed to be taking qtern in place of Glyxambi. Today during OV pt stated and pointed out that he needed refills of Glyzambi. 2 weeks worth of samples given.  When pt arrived home the qtern had came in the mail. Informed wife that qtern has taken place of Glyzambi and to hold those for now and continue with qtern. Wife understood.

## 2021-02-22 ENCOUNTER — Ambulatory Visit (INDEPENDENT_AMBULATORY_CARE_PROVIDER_SITE_OTHER): Payer: PPO | Admitting: Pharmacist

## 2021-02-22 DIAGNOSIS — E782 Mixed hyperlipidemia: Secondary | ICD-10-CM

## 2021-02-22 DIAGNOSIS — E1169 Type 2 diabetes mellitus with other specified complication: Secondary | ICD-10-CM

## 2021-03-08 NOTE — Patient Instructions (Signed)
Visit Information  Current Barriers:  Unable to independently afford treatment regimen Suboptimal therapeutic regimen for T2DM  Pharmacist Clinical Goal(s):  Over the next 90 days, patient will verbalize ability to afford treatment regimen maintain control of T2DM as evidenced by improved glycemic control; decreased side effects from medication switch  through collaboration with PharmD and provider.    Interventions: 1:1 collaboration with [name], Fransisca Kaufmann, MD regarding development and update of comprehensive plan of care as evidenced by provider attestation and co-signature Inter-disciplinary care team collaboration (see longitudinal plan of care) Comprehensive medication review performed; medication list updated in electronic medical record  Diabetes: Controlled; current treatment: QTERN 10/5mg  (SGLT2/DPP4 COMBO-->1 TABLET EVERY AM), TOUJEO 10 units at bedtime;  A1c 6.7%--controlled/IMPROVED Jardiance stopped due to reported weight loss  Tradjenta did not lower Bgs enough CONTINUE QTERN 10/5mg  -- 1 tablet by mouth every morning Patient [place] until 04/22/22 CONTINUE Toujeo Current glucose readings: fasting glucose: 88-104, post prandial glucose: 160-180 Denies hypoglycemic/hyperglycemic symptoms Discussed meal planning options and Plate method for healthy eating Avoid sugary drinks and desserts Incorporate balanced protein, non starchy veggies, 1 serving of carbohydrate with each meal Increase water intake Increase physical activity as able Current exercise: n/a Educated on new medication (QTERN) Assessed patient finances. Application [place] for [place] patient [place] application for QTERN 10/5mg --escribed to patient assistance pharmacy--this will replace glyxambi (easier to get)--MEDICATION TO SHIP TO PATIENT'S HOME IN 3 MONTH INCREMENTS; APPROVED UNTIL 04/22/22  HYPERLIPIDEMIA -LDL 94 (goal <70 w/ T2DM) -no mention of previous  intolerances with statins --> currently on Pravastatin 40mg  qHS -would like to transition patient to rosuvastatin-->will explore -labs reviewed -diet/lifestyle modifications encouraged  Patient Goals/Self-Care Activities Over the next 90 days, patient will:  - take medications as prescribed  Follow Up Plan: Telephone follow up appointment with care management team member scheduled for: 3 MONTHS   The patient verbalized understanding of instructions, educational materials, and care plan provided today and declined offer to receive copy of patient instructions, educational materials, and care plan.   Telephone follow up appointment with care management team member scheduled for: 3 MONTHS  Signature 05/05/22, PharmD, BCPS Clinical Pharmacist, [place]  II Phone [phone]

## 2021-03-08 NOTE — Progress Notes (Signed)
Chronic Care Management Pharmacy Note  02/22/2021 Name:  MACARIUS RUARK MRN:  [id number] DOB:  02-01-1951  Summary: T2DM, HLD  Recommendations/Changes made from today's visit:  Diabetes: Controlled; current treatment: QTERN 10/19m (SGLT2/DPP4 COMBO-->1 TABLET EVERY AM), TOUJEO 10 units at bedtime;  A1c 6.7%--controlled/IMPROVED Jardiance stopped due to reported weight loss  Tradjenta did not lower Bgs enough CONTINUE QTERN 10/51m-- 1 tablet by mouth every morning Patient [place] patient [place] program until 04/22/22 CONTINUE Toujeo Current glucose readings: fasting glucose: 88-104, post prandial glucose: 160-180 Denies hypoglycemic/hyperglycemic symptoms Discussed meal planning options and Plate method for healthy eating Avoid sugary drinks and desserts Incorporate balanced protein, non starchy veggies, 1 serving of carbohydrate with each meal Increase water intake Increase physical activity as able Current exercise: n/a Educated on new medication (QTERN) Assessed patient finances. Application approved for [place] patient [place] application for QTERN 1027/2ZD--GUYQIHKVo patient assistance pharmacy--this will replace glyxambi (easier to get)--MEDICATION TO SHIP TO PATIENT'S HOME IN 3 MONTH INCREMENTS; APPROVED UNTIL 04/22/22  HYPERLIPIDEMIA -LDL 94 (goal <70 w/ T2DM) -no mention of previous intolerances with statins --> currently on Pravastatin 4053mHS -would like to transition patient to rosuvastatin-->will explore -labs reviewed -diet/lifestyle modifications encouraged  Follow Up Plan: Telephone follow up appointment with care management team member scheduled for: 3 MONTHS  Subjective: LloHAKIEM MALIZIA an [age]o. year old male who is a primary patient of [name], [name]  The CCM team was consulted for assistance with disease management and care coordination needs.    Engaged with patient by telephone for follow up visit in response to  provider referral for pharmacy case management and/or care coordination services.   Consent to Services:  The patient was given information about Chronic Care Management services, agreed to services, and gave verbal consent prior to initiation of services.  Please see initial visit note for detailed documentation.   Patient Care Team: [name], JosFransisca KaufmannD as PCP - General (Family Medicine) [name] as PCP - Cardiology (Cardiology) [name], [name] as Consulting Physician (Neurology) [facility] Pharmacist (Family Medicine)  Objective:  Lab Results  Component Value Date   CREATININE 1.16 02/02/2021   CREATININE 1.12 11/01/2020   CREATININE 1.28 (H) 08/04/2020    Lab Results  Component Value Date   HGBA1C 6.7 (H) 02/02/2021   Last diabetic Eye exam: No results found for: HMDIABEYEEXA  Last diabetic Foot exam: No results found for: HMDIABFOOTEX      Component Value Date/Time   CHOL 159 02/02/2021 0844   TRIG 65 02/02/2021 0844   HDL 52 02/02/2021 0844   CHOLHDL 3.1 02/02/2021 0844   CHOLHDL 3.4 09/23/2018 0749   VLDL 20 06/27/2015 0816   LDLCALC 94 02/02/2021 0844   LDLCALC 100 (H) 09/23/2018 0749    Hepatic Function Latest Ref Rng & Units 02/02/2021 08/04/2020 10/06/2019  Total Protein 6.0 - 8.5 g/dL 7.1 6.9 6.2  Albumin 3.7 - 4.7 g/dL 4.8(H) 4.7 -  AST 0 - 40 IU/L 14 17 14   ALT 0 - 44 IU/L 17 18 14   Alk Phosphatase 44 - 121 IU/L 53 51 -  Total Bilirubin 0.0 - 1.2 mg/dL 0.5 0.4 0.5    Lab Results  Component Value Date/Time   TSH 1.39 10/06/2019 09:25 AM   TSH 2.09 02/05/2018 08:18 AM   FREET4 1.0 10/06/2019 09:25 AM   FREET4 1.0 02/05/2018 08:18 AM    CBC Latest Ref Rng &  Units 02/02/2021 11/01/2020 08/04/2020  WBC 3.4 - 10.8 x10E3/uL 4.4 4.8 4.4  Hemoglobin 13.0 - 17.7 g/dL 14.2 13.3 13.2  Hematocrit 37.5 - 51.0 % 43.1 42.4 40.2  Platelets 150 - 450 x10E3/uL 156 147(L) 185    Lab Results  Component Value Date/Time   VD25OH 43  10/06/2019 09:25 AM   VD25OH 55 06/03/2017 08:56 AM    Clinical ASCVD: No  The 10-year ASCVD risk score (Arnett DK, et al., 2019) is: 35.5%   Values used to calculate the score:     Age: 45 years     Sex: Male     Is Non-Hispanic African American: No     Diabetic: Yes     Tobacco smoker: No     Systolic Blood Pressure: 973 mmHg     Is BP treated: No     HDL Cholesterol: 52 mg/dL     Total Cholesterol: 159 mg/dL    Other: (CHADS2VASc if Afib, PHQ9 if depression, MMRC or CAT for COPD, ACT, DEXA)  Social History   Tobacco Use  Smoking Status Never  Smokeless Tobacco Never   BP Readings from Last 3 Encounters:  02/06/21 136/77  01/23/21 (!) 97/57  11/04/20 128/68   Pulse Readings from Last 3 Encounters:  02/06/21 71  01/23/21 75  11/04/20 78   Wt Readings from Last 3 Encounters:  02/06/21 137 lb (62.1 kg)  01/23/21 138 lb (62.6 kg)  01/09/21 138 lb 3.2 oz (62.7 kg)    Assessment: Review of patient past medical history, allergies, medications, health status, including review of consultants reports, laboratory and other test data, was performed as part of comprehensive evaluation and provision of chronic care management services.   SDOH:  (Social Determinants of Health) assessments and interventions performed:    CCM Care Plan  No Known Allergies  Medications Reviewed Today     Reviewed by Lavera Guise, [facility] (Pharmacist) on 03/08/21 at [place] List Status: <None>   Medication Order Taking? Sig Documenting Provider Last Dose Status Informant  aspirin EC 81 MG tablet [id number] No Take 81 mg by mouth daily. [provider] Taking Active Spouse/Significant Other  CINNAMON PO [id number] No Take 1,000 mg by mouth daily. [provider] Taking Active Spouse/Significant Other  Dapagliflozin-sAXagliptin (QTERN) 10-5 MG TABS [id number] No Take 1 tablet by mouth in the morning. Cancel onglyza RX. [name], Fransisca Kaufmann, MD Taking Active            Med Note  Leisa Lenz Mar 08, 2021  1:01 AM) VIA [place] PATIENT ASSISTANCE PROGRAM   Discontinued 03/08/21 0100 (Change in therapy)            Med Note (Dacy Enrico D   Wed Dec 21, 2020 11:08 AM) Samples given  fish oil-omega-3 fatty acids 1000 MG capsule [id number] No Take 1 g by mouth daily. [provider] Taking Active Spouse/Significant Other  insulin glargine, 1 Unit Dial, (TOUJEO SOLOSTAR) 300 UNIT/ML Solostar Pen [id number] No Inject 10 Units into the skin at bedtime. [name], Fransisca Kaufmann, MD Taking Active Spouse/Significant Other  Insulin Pen Needle (ULTICARE MINI PEN NEEDLES) 31G X 6 MM MISC [id number] No USE AS DIRECTED AT BEDTIME Cassandria Anger, MD Taking Active Spouse/Significant Other  Multiple Vitamin (MULTIVITAMIN) tablet [id number] No Take 1 tablet by mouth daily. [provider] Taking Active Spouse/Significant Other  pravastatin (PRAVACHOL) 40 MG tablet [id number] No Take 1 tablet (40 mg total) by mouth daily. [name], Fransisca Kaufmann,  MD Taking Active Spouse/Significant Other            Patient Active Problem List   Diagnosis Date Noted   S/P deep brain stimulator placement 04/26/2016   Essential tremor 04/13/2015   Type 2 diabetes mellitus with other specified complication ([place]) 45/40/9811   Essential hypertension 03/24/2014   Mixed hyperlipidemia 03/24/2014   Family history of coronary artery disease 03/24/2014   Trigeminal neuralgia 03/24/2012    Immunization History  Administered Date(s) Administered   Fluad Quad(high Dose 65+) 12/30/2018, 02/05/2020, 02/06/2021   Influenza, High Dose Seasonal PF 02/07/2015, 02/01/2016, 01/02/2017, 01/24/2018, 01/24/2018   Influenza-Unspecified 02/02/2014   PFIZER(Purple Top)SARS-COV-2 Vaccination 05/10/2019, 05/28/2019, 01/07/2020   Pneumococcal Conjugate-13 04/07/2015   Pneumococcal Polysaccharide-23 07/02/2016, 12/30/2018   Tdap 08/23/2017   Zoster Recombinat (Shingrix) 07/08/2020, 09/16/2020   Zoster,  Live 12/02/2012    Conditions to be addressed/monitored: HLD and DMII  Care Plan : PHARMD MEDICATION MANAGEMENT  Updates made by Lavera Guise, [place] since 03/08/2021 12:00 AM     Problem: DISEASE PROGRESSION PREVENTION      Long-Range Goal: T2DM, HYPERLIPIDEMIA   Recent Progress: On track  Priority: High  Note:   Current Barriers:  Unable to independently afford treatment regimen Suboptimal therapeutic regimen for T2DM  Pharmacist Clinical Goal(s):  Over the next 90 days, patient will verbalize ability to afford treatment regimen maintain control of T2DM as evidenced by improved glycemic control; decreased side effects from medication switch  through collaboration with PharmD and provider.    Interventions: 1:1 collaboration with [name], Fransisca Kaufmann, MD regarding development and update of comprehensive plan of care as evidenced by provider attestation and co-signature Inter-disciplinary care team collaboration (see longitudinal plan of care) Comprehensive medication review performed; medication list updated in electronic medical record  Diabetes: Controlled; current treatment: QTERN 10/61m (SGLT2/DPP4 COMBO-->1 TABLET EVERY AM), TOUJEO 10 units at bedtime;  A1c 6.7%--controlled/IMPROVED Jardiance stopped due to reported weight loss  Tradjenta did not lower Bgs enough CONTINUE QTERN 10/530m-- 1 tablet by mouth every morning Patient enrolled in [place]&me patient assistance program until 04/22/22 CONTINUE Toujeo Current glucose readings: fasting glucose: 88-104, post prandial glucose: 160-180 Denies hypoglycemic/hyperglycemic symptoms Discussed meal planning options and Plate method for healthy eating Avoid sugary drinks and desserts Incorporate balanced protein, non starchy veggies, 1 serving of carbohydrate with each meal Increase water intake Increase physical activity as able Current exercise: n/a Educated on new medication (QTERN) Assessed patient finances. Application  approved for [place]&me patient assistance application for QTERN 1091/4NW--GNFAOZHYo patient assistance pharmacy--this will replace glyxambi (easier to get)--MEDICATION TO SHIP TO PATIENT'S HOME IN 3 MONTH INCREMENTS; APPROVED UNTIL 04/22/22  HYPERLIPIDEMIA -LDL 94 (goal <70 w/ T2DM) -no mention of previous intolerances with statins --> currently on Pravastatin 4048mHS -would like to transition patient to rosuvastatin-->will explore -labs reviewed -diet/lifestyle modifications encouraged   Patient Goals/Self-Care Activities Over the next 90 days, patient will:  - take medications as prescribed  Follow Up Plan: Telephone follow up appointment with care management team member scheduled for: 3 MONTHS      Medication Assistance:  QTERN obtained through [place] medication assistance program.  Enrollment ends 04/22/22  Patient's preferred pharmacy is:  [id number] [address]: [phone]: [phone] [facility] [place]: [phone]: [phone]  [place] - [address] [place] [address] Phone: [phone] Fax: [place], [place]. [place] [address] Phone: [phone] Fax: [phone]  Uses pill box? No - N/A Pt endorses 100% compliance  Follow Up:  Patient agrees to Care Plan and Follow-up.  Plan: Telephone follow up appointment with care management team member scheduled for:  3 MONTHS   Regina Eck, PharmD, BCPS Clinical Pharmacist, [place]  [place] Phone [phone]

## 2021-03-22 DIAGNOSIS — E1169 Type 2 diabetes mellitus with other specified complication: Secondary | ICD-10-CM

## 2021-03-22 DIAGNOSIS — E782 Mixed hyperlipidemia: Secondary | ICD-10-CM | POA: Diagnosis not present

## 2021-04-10 NOTE — Progress Notes (Signed)
Assessment/Plan:    1.  Essential Tremor   -Status post DBS surgery.  IPG changed March 22, 2016, and November 01, 2020.  2.  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  -pt now off of lyrica and carbamazepine  -had V2/V3 rhizotomy with Dr. Vertell Limber 10/2019  -had repeat V2/V3 rhizotomy with Dr. Vertell Limber October, 2021.  Great results and remains pain-free. 3.  Low back pain  -MRI lumbar spine in February, 2022 fairly unremarkable.  Following with Dr. Vertell Limber.  Has done physical therapy. 4.  Weight loss  -Told patient that he will need to follow-up further with primary care.  I am unsure of cause of weight loss.  I have taken him off of any medication prescribed by me.  DBS would certainly not be the cause.  I wonder if Vania Rea is the source of his weight loss. 5.  Follow-up 1 year  Subjective:   John Reese was seen today in follow up for essential tremor.  My previous records were reviewed prior to todays visit.  Patient with wife who supplements history.  Pt denies falls.  Pt denies lightheadedness, near syncope.  No hallucinations.  His IPG was changed July 12.  Patient has done well with that.  They ask about weight loss.  States that primary care has done a work-up and unknown cause.  Patient feels good.  Feels that he has a good appetite.  Current prescribed movement disorder medications: None (weaned off)  Prior meds: Carbamazepine; Lyrica (myoclonus with higher dosages)   ALLERGIES:   No Known Allergies   CURRENT MEDICATIONS:  Outpatient Encounter Medications as of 04/12/2021  Medication Sig   aspirin EC 81 MG tablet Take 81 mg by mouth daily.   CINNAMON PO Take 1,000 mg by mouth daily.   fish oil-omega-3 fatty acids 1000 MG capsule Take 1 g by mouth daily.   insulin glargine, 1 Unit Dial, (TOUJEO SOLOSTAR) 300 UNIT/ML Solostar Pen Inject 10 Units into the skin at bedtime.   Insulin Pen Needle (ULTICARE  MINI PEN NEEDLES) 31G X 6 MM MISC USE AS DIRECTED AT BEDTIME   Multiple Vitamin (MULTIVITAMIN) tablet Take 1 tablet by mouth daily.   pravastatin (PRAVACHOL) 40 MG tablet Take 1 tablet (40 mg total) by mouth daily.   Dapagliflozin-sAXagliptin (QTERN) 10-5 MG TABS Take 1 tablet by mouth in the morning. Cancel onglyza RX.   No facility-administered encounter medications on file as of 04/12/2021.     Objective:    PHYSICAL EXAMINATION:    VITALS:   Vitals:   04/12/21 0929  BP: 132/78  Pulse: 78  SpO2: 98%  Weight: 138 lb 6.4 oz (62.8 kg)  Height: 6\' 1"  (1.854 m)      GEN:  The patient appears stated age and is in NAD. HEENT:  Normocephalic, atraumatic.  The mucous membranes are moist. The superficial temporal arteries are without ropiness or tenderness.   Neurological examination:  Orientation: The patient is alert and oriented x3. Cranial nerves: There is good facial symmetry. The speech is fluent and clear. Soft palate rises symmetrically and there is no tongue deviation. Hearing is intact to conversational tone. Sensation: Sensation is intact to light touch throughout Motor: Strength is at least antigravity x4.  Movement examination: Tone: There is normal tone in the UE/LE Abnormal movements: none.  No rest tremor.  No postural tremor.  No intention tremor. Coordination:  There is no decremation with RAM's  Gait and Station: The patient has no difficulty arising out of a deep-seated chair without the use of the hands. The patient's stride length is good I have reviewed and interpreted the following labs independently   Chemistry      Component Value Date/Time   NA 138 02/02/2021 0844   K 5.5 (H) 02/02/2021 0844   CL 99 02/02/2021 0844   CO2 25 02/02/2021 0844   BUN 30 (H) 02/02/2021 0844   CREATININE 1.16 02/02/2021 0844   CREATININE 0.96 10/06/2019 0925      Component Value Date/Time   CALCIUM 10.3 (H) 02/02/2021 0844   ALKPHOS 53 02/02/2021 0844   AST 14  02/02/2021 0844   ALT 17 02/02/2021 0844   BILITOT 0.5 02/02/2021 0844      Lab Results  Component Value Date   WBC 4.4 02/02/2021   HGB 14.2 02/02/2021   HCT 43.1 02/02/2021   MCV 87 02/02/2021   PLT 156 02/02/2021   Lab Results  Component Value Date   TSH 1.39 10/06/2019     Chemistry      Component Value Date/Time   NA 138 02/02/2021 0844   K 5.5 (H) 02/02/2021 0844   CL 99 02/02/2021 0844   CO2 25 02/02/2021 0844   BUN 30 (H) 02/02/2021 0844   CREATININE 1.16 02/02/2021 0844   CREATININE 0.96 10/06/2019 0925      Component Value Date/Time   CALCIUM 10.3 (H) 02/02/2021 0844   ALKPHOS 53 02/02/2021 0844   AST 14 02/02/2021 0844   ALT 17 02/02/2021 0844   BILITOT 0.5 02/02/2021 0844       Cc:  [name], Fransisca Kaufmann, MD

## 2021-04-12 ENCOUNTER — Other Ambulatory Visit: Payer: Self-pay

## 2021-04-12 ENCOUNTER — Ambulatory Visit: Payer: PPO | Admitting: Neurology

## 2021-04-12 VITALS — BP 132/78 | HR 78 | Ht 73.0 in | Wt 138.4 lb

## 2021-04-12 DIAGNOSIS — G250 Essential tremor: Secondary | ICD-10-CM | POA: Diagnosis not present

## 2021-04-12 DIAGNOSIS — R634 Abnormal weight loss: Secondary | ICD-10-CM

## 2021-04-12 DIAGNOSIS — Z9689 Presence of other specified functional implants: Secondary | ICD-10-CM | POA: Diagnosis not present

## 2021-04-12 NOTE — Procedures (Signed)
DBS Programming was performed.    Manufacturer of DBS device: Medtronic  Total time spent programming was 10 minutes.  Device was confirmed to be on.  Soft start was confirmed to be on.  Impedences were checked and were within normal limits.  Battery was checked and was determined to be functioning normally and not near end of life  Final settings were as follows:   Active Contact Amplitude (V) PW (ms) Frequency (hz) Side Effects Battery  Left Brain 1-2+ 2.8 90 150  2.85  02/02/20        10/04/20 1-2+ 2.8 90 150  2.75  04/12/21 1-2+ 2.8 90 150  2.97                  Right Brain        02/02/20 1-2+ 2.7 90 150  2.83  10/04/20 1-2+ 2.7 90 150  2.58  04/12/21 1-2+ 2.7 90 150  2.97

## 2021-05-10 ENCOUNTER — Encounter: Payer: Self-pay | Admitting: Family Medicine

## 2021-05-10 ENCOUNTER — Ambulatory Visit (INDEPENDENT_AMBULATORY_CARE_PROVIDER_SITE_OTHER): Payer: PPO | Admitting: Family Medicine

## 2021-05-10 VITALS — BP 124/66 | HR 76 | Temp 92.8°F | Ht 73.0 in | Wt 134.0 lb

## 2021-05-10 DIAGNOSIS — E782 Mixed hyperlipidemia: Secondary | ICD-10-CM

## 2021-05-10 DIAGNOSIS — Z0001 Encounter for general adult medical examination with abnormal findings: Secondary | ICD-10-CM

## 2021-05-10 DIAGNOSIS — Z0000 Encounter for general adult medical examination without abnormal findings: Secondary | ICD-10-CM

## 2021-05-10 DIAGNOSIS — E1169 Type 2 diabetes mellitus with other specified complication: Secondary | ICD-10-CM | POA: Diagnosis not present

## 2021-05-10 DIAGNOSIS — Z125 Encounter for screening for malignant neoplasm of prostate: Secondary | ICD-10-CM | POA: Diagnosis not present

## 2021-05-10 DIAGNOSIS — I10 Essential (primary) hypertension: Secondary | ICD-10-CM | POA: Diagnosis not present

## 2021-05-10 LAB — BAYER DCA HB A1C WAIVED: HB A1C (BAYER DCA - WAIVED): 7.1 % — ABNORMAL HIGH (ref 4.8–5.6)

## 2021-05-10 NOTE — Progress Notes (Signed)
BP 124/66    Pulse 76    Temp (!) 92.8 F (33.8 C) (Temporal)    Ht _0  (1.854 m)    Wt 134 lb (60.8 kg)    SpO2 97%    BMI 17.68 kg/m    Subjective:   Patient ID: John Reese, male    DOB: [birth year], 71 y.o.   MRN: [id number]  HPI: John NIDIFFER is a 71 y.o. male presenting on 05/10/2021 for Annual Exam   HPI Physical exam Patient denies any chest pain, shortness of breath, headaches or vision issues, abdominal complaints, diarrhea, nausea, vomiting, or joint issues.   Type 2 diabetes mellitus Patient comes in today for recheck of his diabetes. Patient has been currently taking dapagliflozin and saxagliptin and Toujeo. Patient is not currently on an ACE inhibitor/ARB. Patient has seen an ophthalmologist this year. Patient denies any issues with their feet. The symptom started onset as an adult hld and htn ARE RELATED TO DM   Hyperlipidemia Patient is coming in for recheck of his hyperlipidemia. The patient is currently taking fish oil and pravastatin. They deny any issues with myalgias or history of liver damage from it. They deny any focal numbness or weakness or chest pain.   Hypertension Patient is currently on no medication currently, and their blood pressure today is 124/60. Patient denies any lightheadedness or dizziness. Patient denies headaches, blurred vision, chest pains, shortness of breath, or weakness. Denies any side effects from medication and is content with current medication.   Patient had hyperkalemia and slightly elevated on last blood draw, recheck today  Relevant past medical, surgical, family and social history reviewed and updated as indicated. Interim medical history since our last visit reviewed. Allergies and medications reviewed and updated.  Review of Systems  Constitutional:  Negative for chills and fever.  Eyes:  Negative for visual disturbance.  Respiratory:  Negative for shortness of breath and wheezing.   Cardiovascular:  Negative for  chest pain and leg swelling.  Musculoskeletal:  Negative for back pain and gait problem.  Skin:  Negative for rash.  Neurological:  Negative for dizziness, weakness and light-headedness.  All other systems reviewed and are negative.  Per HPI unless specifically indicated above   Allergies as of 05/10/2021   No Known Allergies      Medication List        Accurate as of May 10, 2021 11:05 AM. If you have any questions, ask your nurse or doctor.          aspirin EC 81 MG tablet Take 81 mg by mouth daily.   CINNAMON PO Take 1,000 mg by mouth daily.   fish oil-omega-3 fatty acids 1000 MG capsule Take 1 g by mouth daily.   multivitamin tablet Take 1 tablet by mouth daily.   pravastatin 40 MG tablet Commonly known as: PRAVACHOL Take 1 tablet (40 mg total) by mouth daily.   Qtern 10-5 MG Tabs Generic drug: Dapagliflozin-sAXagliptin Take 1 tablet by mouth in the morning. Cancel onglyza RX.   Toujeo SoloStar 300 UNIT/ML Solostar Pen Generic drug: insulin glargine (1 Unit Dial) Inject 10 Units into the skin at bedtime.   UltiCare Mini Pen Needles 31G X 6 MM Misc Generic drug: Insulin Pen Needle USE AS DIRECTED AT BEDTIME         Objective:   BP 124/66    Pulse 76    Temp (!) 92.8 F (33.8 C) (Temporal)    Ht _1  (1.854  m)    Wt 134 lb (60.8 kg)    SpO2 97%    BMI 17.68 kg/m   Wt Readings from Last 3 Encounters:  05/10/21 134 lb (60.8 kg)  04/12/21 138 lb 6.4 oz (62.8 kg)  02/06/21 137 lb (62.1 kg)    Physical Exam Vitals and nursing note reviewed.  Constitutional:      General: He is not in acute distress.    Appearance: He is well-developed. He is not diaphoretic.  Eyes:     General: No scleral icterus.    Conjunctiva/sclera: Conjunctivae normal.  Neck:     Thyroid: No thyromegaly.  Cardiovascular:     Rate and Rhythm: Normal rate and regular rhythm.     Heart sounds: Normal heart sounds. No murmur heard. Pulmonary:     Effort: Pulmonary  effort is normal. No respiratory distress.     Breath sounds: Normal breath sounds. No wheezing.  Musculoskeletal:        General: Normal range of motion.     Cervical back: Neck supple.  Lymphadenopathy:     Cervical: No cervical adenopathy.  Skin:    General: Skin is warm and dry.     Findings: No rash.  Neurological:     Mental Status: He is alert and oriented to person, place, and time.     Coordination: Coordination normal.  Psychiatric:        Behavior: Behavior normal.      Assessment & Plan:   Problem List Items Addressed This Visit       Cardiovascular and Mediastinum   Essential hypertension   Relevant Orders   CMP14+EGFR     Endocrine   Type 2 diabetes mellitus with other specified complication ([place]) - Primary   Relevant Orders   Bayer DCA Hb A1c Waived   CMP14+EGFR     Other   Mixed hyperlipidemia   Other Visit Diagnoses     Prostate cancer screening       Relevant Orders   PSA, total and free   Physical exam           A1c elevated, focus on diet.  Only slightly elevated.  No change in medicine at this point.  Blood pressure looks good, no other changes Follow up plan: Return in about 3 months (around 08/08/2021), or if symptoms worsen or fail to improve, for Diabetes recheck.  Counseling provided for all of the vaccine components Orders Placed This Encounter  Procedures   Bayer DCA Hb A1c Waived   CMP14+EGFR   PSA, total and free    Caryl Pina, MD [facility] 05/10/2021, 11:05 AM

## 2021-05-11 LAB — CMP14+EGFR
ALT: 14 IU/L (ref 0–44)
AST: 14 IU/L (ref 0–40)
Albumin/Globulin Ratio: 1.7 (ref 1.2–2.2)
Albumin: 4.2 g/dL (ref 3.7–4.7)
Alkaline Phosphatase: 62 IU/L (ref 44–121)
BUN/Creatinine Ratio: 23 (ref 10–24)
BUN: 26 mg/dL (ref 8–27)
Bilirubin Total: 0.2 mg/dL (ref 0.0–1.2)
CO2: 24 mmol/L (ref 20–29)
Calcium: 9.9 mg/dL (ref 8.6–10.2)
Chloride: 102 mmol/L (ref 96–106)
Creatinine, Ser: 1.12 mg/dL (ref 0.76–1.27)
Globulin, Total: 2.5 g/dL (ref 1.5–4.5)
Glucose: 116 mg/dL — ABNORMAL HIGH (ref 70–99)
Potassium: 5.3 mmol/L — ABNORMAL HIGH (ref 3.5–5.2)
Sodium: 139 mmol/L (ref 134–144)
Total Protein: 6.7 g/dL (ref 6.0–8.5)
eGFR: 70 mL/min/{1.73_m2} (ref >59)

## 2021-05-11 LAB — PSA, TOTAL AND FREE
PSA, Free Pct: 15.8 %
PSA, Free: 0.52 ng/mL
Prostate Specific Ag, Serum: 3.3 ng/mL (ref 0.0–4.0)

## 2021-06-07 ENCOUNTER — Ambulatory Visit (INDEPENDENT_AMBULATORY_CARE_PROVIDER_SITE_OTHER): Payer: PPO | Admitting: Pharmacist

## 2021-06-07 DIAGNOSIS — E782 Mixed hyperlipidemia: Secondary | ICD-10-CM

## 2021-06-07 DIAGNOSIS — E1169 Type 2 diabetes mellitus with other specified complication: Secondary | ICD-10-CM

## 2021-06-07 MED ORDER — QTERN 10-5 MG PO TABS: 1.0000 | ORAL_TABLET | Freq: Every morning | ORAL | 4 refills | Status: DC

## 2021-06-07 NOTE — Patient Instructions (Addendum)
Visit Information  Following are the goals we discussed today:   Current Barriers:  Unable to independently afford treatment regimen Suboptimal therapeutic regimen for T2DM  Pharmacist Clinical Goal(s):  Over the next 90 days, patient will verbalize ability to afford treatment regimen maintain control of T2DM as evidenced by improved glycemic control; decreased side effects from medication switch  through collaboration with PharmD and provider.   Interventions: 1:1 collaboration with [name], Fransisca Kaufmann, MD regarding development and update of comprehensive plan of care as evidenced by provider attestation and co-signature Inter-disciplinary care team collaboration (see longitudinal plan of care) Comprehensive medication review performed; medication list updated in electronic medical record  Diabetes: Controlled; Current treatment: QTERN 10/5mg  (SGLT2/DPP4 COMBO-->1 TABLET EVERY AM), TOUJEO 10 units at bedtime;  A1c 6.7%-->7.1%--slight increase, patient thinks it is due to 2 weeks of illness where his sugar ran highere Jardiance stopped due to reported weight loss  Tradjenta did not lower BGs enough CONTINUE QTERN 10/5mg  -- 1 tablet by mouth every morning [place] until 04/22/22 CONTINUE Toujeo Continue current regimen Current glucose readings: fasting glucose: 88-104, post prandial glucose: 160-180 Denies hypoglycemic/hyperglycemic symptoms Discussed meal planning options and Plate method for healthy eating Avoid sugary drinks and desserts Incorporate balanced protein, non starchy veggies, 1 serving of carbohydrate with each meal Increase water intake Increase physical activity as able Current exercise: n/a Educated on new medication (QTERN) Assessed patient finances. Application [place] for [place] patient [place] application for QTERN 10/5mg --escribed to patient assistance pharmacy--this will replace glyxambi (easier to  get)--MEDICATION TO SHIP TO PATIENT'S HOME IN 3 MONTH INCREMENTS; APPROVED UNTIL 04/22/22  HYPERLIPIDEMIA -LDL 94 (goal <70 w/ T2DM) -no mention of previous intolerances with statins --> currently on Pravastatin 40mg  qHS -would like to transition patient to rosuvastatin-->will explore -labs reviewed -diet/lifestyle modifications encouraged  Hypertension -patient not on ACEi/ARB -was on ramipril in the past but unclear why d/c'd -BP controlled-will address at f/u  Patient Goals/Self-Care Activities Over the next 90 days, patient will:  - take medications as prescribed  Follow Up Plan: Telephone follow up appointment with care management team member scheduled for: 3 MONTHS   Plan: Telephone follow up appointment with care management team member scheduled for:  3 months  Signature 05/05/22, PharmD, BCPS Clinical Pharmacist, [place]  [place] Phone [phone]   Please call the care guide team at [phone] if you need to cancel or reschedule your appointment.   Patient verbalizes understanding of instructions and care plan provided today and agrees to view in [place]. Active MyChart status confirmed with patient.

## 2021-06-07 NOTE — Progress Notes (Signed)
Chronic Care Management Pharmacy Note  06/07/2021 Name:  John Reese MRN:  [id number] DOB:  1950/11/19  Summary: T2DM  Recommendations/Changes made from today's visit: Diabetes: Controlled; Current treatment: QTERN 10/58m (SGLT2/DPP4 COMBO-->1 TABLET EVERY AM), TOUJEO 10 units at bedtime;  A1c 6.7%-->7.1%--slight increase, patient thinks it is due to 2 weeks of illness where his sugar ran higher Jardiance stopped due to reported weight loss  Tradjenta did not lower BGs enough CONTINUE QTERN 10/532m-- 1 tablet by mouth every morning Patient [place] in [place] patient [place] program until 04/22/22 CONTINUE Toujeo Continue current regimen Current glucose readings: fasting glucose: <130, post prandial glucose: 160-180  Subjective: John SMESTADs an [age].o. year old male who is a primary patient of [name], JoFransisca KaufmannMD.  The CCM team was consulted for assistance with disease management and care coordination needs.    Engaged with patient by telephone for follow up visit in response to provider referral for pharmacy case management and/or care coordination services.   Consent to Services:  The patient was given information about Chronic Care Management services, agreed to services, and gave verbal consent prior to initiation of services.  Please see initial visit note for detailed documentation.   Patient Care Team: [name], JoFransisca KaufmannMD as PCP - General (Family Medicine) John PainMD as PCP - Cardiology (Cardiology) [name], ReEustace QuailDO as Consulting Physician (Neurology) [facility] Pharmacist (Family Medicine)  Objective:  Lab Results  Component Value Date   CREATININE 1.12 05/10/2021   CREATININE 1.16 02/02/2021   CREATININE 1.12 11/01/2020    Lab Results  Component Value Date   HGBA1C 7.1 (H) 05/10/2021   Last diabetic Eye exam: No results found for: HMDIABEYEEXA  Last diabetic Foot exam: No results found for: HMDIABFOOTEX       Component Value Date/Time   CHOL 159 02/02/2021 0844   TRIG 65 02/02/2021 0844   HDL 52 02/02/2021 0844   CHOLHDL 3.1 02/02/2021 0844   CHOLHDL 3.4 09/23/2018 0749   VLDL 20 06/27/2015 0816   LDLCALC 94 02/02/2021 0844   LDLCALC 100 (H) 09/23/2018 0749    Hepatic Function Latest Ref Rng & Units 05/10/2021 02/02/2021 08/04/2020  Total Protein 6.0 - 8.5 g/dL 6.7 7.1 6.9  Albumin 3.7 - 4.7 g/dL 4.2 4.8(H) 4.7  AST 0 - 40 IU/L 14 14 17   ALT 0 - 44 IU/L 14 17 18   Alk Phosphatase 44 - 121 IU/L 62 53 51  Total Bilirubin 0.0 - 1.2 mg/dL 0.2 0.5 0.4    Lab Results  Component Value Date/Time   TSH 1.39 10/06/2019 09:25 AM   TSH 2.09 02/05/2018 08:18 AM   FREET4 1.0 10/06/2019 09:25 AM   FREET4 1.0 02/05/2018 08:18 AM    CBC Latest Ref Rng & Units 02/02/2021 11/01/2020 08/04/2020  WBC 3.4 - 10.8 x10E3/uL 4.4 4.8 4.4  Hemoglobin 13.0 - 17.7 g/dL 14.2 13.3 13.2  Hematocrit 37.5 - 51.0 % 43.1 42.4 40.2  Platelets 150 - 450 x10E3/uL 156 147(L) 185    Lab Results  Component Value Date/Time   VD25OH 43 10/06/2019 09:25 AM   VD25OH 55 06/03/2017 08:56 AM    Clinical ASCVD: No  The 10-year ASCVD risk score (Arnett DK, et al., 2019) is: 31.1%   Values used to calculate the score:     Age: [age]     Sex: Male     Is Non-Hispanic African American: No     Diabetic:  Yes     Tobacco smoker: No     Systolic Blood Pressure: 409 mmHg     Is BP treated: No     HDL Cholesterol: 52 mg/dL     Total Cholesterol: 159 mg/dL    Other: (CHADS2VASc if Afib, PHQ9 if depression, MMRC or CAT for COPD, ACT, DEXA)  Social History   Tobacco Use  Smoking Status Never  Smokeless Tobacco Never   BP Readings from Last 3 Encounters:  05/10/21 124/66  04/12/21 132/78  02/06/21 136/77   Pulse Readings from Last 3 Encounters:  05/10/21 76  04/12/21 78  02/06/21 71   Wt Readings from Last 3 Encounters:  05/10/21 134 lb (60.8 kg)  04/12/21 138 lb 6.4 oz (62.8 kg)  02/06/21 137 lb (62.1 kg)     Assessment: Review of patient past medical history, allergies, medications, health status, including review of consultants reports, laboratory and other test data, was performed as part of comprehensive evaluation and provision of chronic care management services.   SDOH:  (Social Determinants of Health) assessments and interventions performed:    CCM Care Plan  No Known Allergies  Medications Reviewed Today     Reviewed by John Reese, [facility] (Pharmacist) on 06/07/21 at 1321  Med List Status: <None>   Medication Order Taking? Sig Documenting Provider Last Dose Status Informant  aspirin EC 81 MG tablet [id number] No Take 81 mg by mouth daily. [provider] Taking Active Spouse/Significant Other  CINNAMON PO [id number] No Take 1,000 mg by mouth daily. [provider] Taking Active Spouse/Significant Other  Dapagliflozin-sAXagliptin (QTERN) 10-5 MG TABS [id number]  Take 1 tablet by mouth in the morning. Cancel onglyza RX. [name], Fransisca Kaufmann, MD  Active            Med Note Blanca Friend, Royce Macadamia   Wed Jun 07, 2021  1:21 PM) Via [place] patient assistance program   fish oil-omega-3 fatty acids 1000 MG capsule [id number] No Take 1 g by mouth daily. [provider] Taking Active Spouse/Significant Other  insulin glargine, 1 Unit Dial, (TOUJEO SOLOSTAR) 300 UNIT/ML Solostar Pen [id number] No Inject 10 Units into the skin at bedtime. [name], Fransisca Kaufmann, MD Taking Active Spouse/Significant Other  Insulin Pen Needle (ULTICARE MINI PEN NEEDLES) 31G X 6 MM MISC [id number] No USE AS DIRECTED AT BEDTIME Cassandria Anger, MD Taking Active Spouse/Significant Other  Multiple Vitamin (MULTIVITAMIN) tablet [id number] No Take 1 tablet by mouth daily. [provider] Taking Active Spouse/Significant Other  pravastatin (PRAVACHOL) 40 MG tablet [id number] No Take 1 tablet (40 mg total) by mouth daily. [name], Fransisca Kaufmann, MD Taking Active Spouse/Significant Other             Patient Active Problem List   Diagnosis Date Noted   S/P deep brain stimulator placement 04/26/2016   Essential tremor 04/13/2015   Type 2 diabetes mellitus with other specified complication ([place]) 67/20/9470   Essential hypertension 03/24/2014   Mixed hyperlipidemia 03/24/2014   Family history of coronary artery disease 03/24/2014   Trigeminal neuralgia 03/24/2012    Immunization History  Administered Date(s) Administered   Fluad Quad(high Dose 65+) 12/30/2018, 02/05/2020, 02/06/2021   Influenza, High Dose Seasonal PF 02/07/2015, 02/01/2016, 01/02/2017, 01/24/2018, 01/24/2018   Influenza-Unspecified 02/02/2014   PFIZER(Purple Top)SARS-COV-2 Vaccination 05/10/2019, 05/28/2019, 01/07/2020   Pneumococcal Conjugate-13 04/07/2015   Pneumococcal Polysaccharide-23 07/02/2016, 12/30/2018   Tdap 08/23/2017   Zoster Recombinat (Shingrix) 07/08/2020, 09/16/2020   Zoster, Live 12/02/2012    Conditions to be addressed/monitored: HTN  and DMII  Care Plan : PHARMD MEDICATION MANAGEMENT  Updates made by John Reese, RPH since 06/07/2021 12:00 AM     Problem: DISEASE PROGRESSION PREVENTION      Long-Range Goal: T2DM, HYPERLIPIDEMIA   Recent Progress: On track  Priority: High  Note:   Current Barriers:  Unable to independently afford treatment regimen Suboptimal therapeutic regimen for T2DM  Pharmacist Clinical Goal(s):  Over the next 90 days, patient will verbalize ability to afford treatment regimen maintain control of T2DM as evidenced by improved glycemic control; decreased side effects from medication switch  through collaboration with PharmD and provider.    Interventions: 1:1 collaboration with [name], Fransisca Kaufmann, MD regarding development and update of comprehensive plan of care as evidenced by provider attestation and co-signature Inter-disciplinary care team collaboration (see longitudinal plan of care) Comprehensive medication review performed; medication list  updated in electronic medical record  Diabetes: Controlled; current treatment: QTERN 10/77m (SGLT2/DPP4 COMBO-->1 TABLET EVERY AM), TOUJEO 10 units at bedtime;  A1c 6.7%-->7.1%--slight increase Jardiance stopped due to reported weight loss  Tradjenta did not lower BGs enough CONTINUE QTERN 10/546m-- 1 tablet by mouth every morning Patient [place] in [place] patient [place] program until 04/22/22 CONTINUE Toujeo Current glucose readings: fasting glucose: 88-104, post prandial glucose: 160-180 Denies hypoglycemic/hyperglycemic symptoms Discussed meal planning options and Plate method for healthy eating Avoid sugary drinks and desserts Incorporate balanced protein, non starchy veggies, 1 serving of carbohydrate with each meal Increase water intake Increase physical activity as able Current exercise: n/a Educated on new medication (QTERN) Assessed patient finances. Application approved for [place] patient assistance application for QTERN 1080/1KP--[facility] patient assistance pharmacy--this will replace glyxambi (easier to get)--MEDICATION TO SHIP TO PATIENT'S HOME IN 3 MONTH INCREMENTS; APPROVED UNTIL 04/22/22  HYPERLIPIDEMIA -LDL 94 (goal <70 w/ T2DM) -no mention of previous intolerances with statins --> currently on Pravastatin 4043mHS -would like to transition patient to rosuvastatin-->will explore -labs reviewed -diet/lifestyle modifications encouraged  Hypertension -patient not on ACEi/ARB -was on ramipril in the past but unclear why d/c'd  Patient Goals/Self-Care Activities Over the next 90 days, patient will:  - take medications as prescribed  Follow Up Plan: Telephone follow up appointment with care management team member scheduled for: 3 MONTHS      Medication Assistance:  QTERN obtained through [place] medication assistance program.  Enrollment ends 04/22/22  Patient's [facility] pharmacy is:  [id number] [id number]: [phone]: [phone] pill box? NO Pt endorses 100% compliance  Follow Up:  Patient agrees to Care Plan and Follow-up.  Plan: Telephone follow up appointment with care management team member scheduled for:  4 months   JuliRegina EckarmD, BCPS Clinical Pharmacist, [place] Phone [phone]

## 2021-06-19 ENCOUNTER — Ambulatory Visit (INDEPENDENT_AMBULATORY_CARE_PROVIDER_SITE_OTHER): Payer: PPO | Admitting: Family Medicine

## 2021-06-19 ENCOUNTER — Encounter: Payer: Self-pay | Admitting: Family Medicine

## 2021-06-19 ENCOUNTER — Ambulatory Visit (INDEPENDENT_AMBULATORY_CARE_PROVIDER_SITE_OTHER): Payer: PPO

## 2021-06-19 VITALS — BP 120/69 | HR 105 | Temp 98.1°F | Ht 73.0 in | Wt 131.0 lb

## 2021-06-19 DIAGNOSIS — R059 Cough, unspecified: Secondary | ICD-10-CM | POA: Diagnosis not present

## 2021-06-19 DIAGNOSIS — R0602 Shortness of breath: Secondary | ICD-10-CM

## 2021-06-19 DIAGNOSIS — J449 Chronic obstructive pulmonary disease, unspecified: Secondary | ICD-10-CM | POA: Diagnosis not present

## 2021-06-19 LAB — URINALYSIS
Bilirubin, UA: NEGATIVE
Leukocytes,UA: NEGATIVE
Nitrite, UA: NEGATIVE
RBC, UA: NEGATIVE
Specific Gravity, UA: 1.015 (ref 1.005–1.030)
Urobilinogen, Ur: 1 mg/dL (ref 0.2–1.0)
pH, UA: 5 (ref 5.0–7.5)

## 2021-06-19 MED ORDER — BETAMETHASONE SOD PHOS & ACET 6 (3-3) MG/ML IJ SUSP
6.0000 mg | Freq: Once | INTRAMUSCULAR | Status: AC
  Administered 2021-06-19: 6 mg via INTRAMUSCULAR

## 2021-06-19 MED ORDER — LEVOFLOXACIN 500 MG PO TABS: 500.0000 mg | ORAL_TABLET | Freq: Every day | ORAL | 0 refills | Status: DC

## 2021-06-19 MED ORDER — CEFTRIAXONE SODIUM 1 G IJ SOLR
1.0000 g | Freq: Once | INTRAMUSCULAR | Status: AC
  Administered 2021-06-19: 1 g via INTRAMUSCULAR

## 2021-06-19 NOTE — Progress Notes (Signed)
Subjective:  Patient ID: John Reese, male    DOB: 03-Jan-1951  Age: 71 y.o. MRN: [id number]  CC: Cold Exposure (Per pt had this cold of and on for about month. Feels like its in his lungs. Been taking over the counter meds, not helping. )   HPI John Reese presents for cough bringing up white sputum during the day & brown sputumat night . Cough getting worse since onset a month ago. For the last two weeks activity level is low. Appetite is off and not taking in much water. He is dyspneic. Lungs feel real tight.  HAd to sleep sitting up in a recliner last night due to dyspnea laying down. WIfe concerned he isn't drinking enough water.   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   He has a past surgical history that includes Inner ear surgery (Right); Finger amputation (Right, 1968); Pulse generator implant (Bilateral, 03/30/2016); Subthalamic stimulator insertion (Bilateral, 03/22/2016); Subthalamic stimulator battery replacement (N/A, 11/01/2020); Colonoscopy (2021); and Polypectomy (2021).   His family history includes Colon cancer (age of onset: 79) in his mother; Colon polyps (age of onset: 22) in his mother; Healthy in his brother, brother, brother, brother, daughter, sister, and son; Heart attack in his brother; Peripheral vascular disease in his sister; Stroke in his father and mother.He reports that he has never smoked. He  has never used smokeless tobacco. He reports that he does not drink alcohol and does not use drugs.    ROS Review of Systems  Constitutional:  Positive for activity change and appetite change. Negative for fever.  HENT:  Positive for rhinorrhea.   Respiratory:  Positive for cough and shortness of breath.   Cardiovascular:  Negative for chest pain.  Musculoskeletal:  Negative for arthralgias.  Skin:  Negative for rash.   Objective:  BP 120/69    Pulse (!) 105    Temp 98.1 F (36.7 C) (Temporal)    Ht 6' 1"  (1.854 m)    Wt 131 lb (59.4 kg)    SpO2 95%    BMI 17.28 kg/m   BP Readings from Last 3 Encounters:  06/19/21 120/69  05/10/21 124/66  04/12/21 132/78    Wt Readings from Last 3 Encounters:  06/19/21 131 lb (59.4 kg)  05/10/21 134 lb (60.8 kg)  04/12/21 138 lb 6.4 oz (62.8 kg)     Physical Exam Constitutional:      General: He is not in acute distress.    Appearance: He is well-developed.  HENT:     Head: Normocephalic and atraumatic.     Right Ear: External ear normal.     Left Ear: External ear normal.     Nose: Nose normal.  Eyes:     Conjunctiva/sclera: Conjunctivae normal.     Pupils: Pupils are equal, round, and reactive to light.  Cardiovascular:  Rate and Rhythm: Normal rate and regular rhythm.     Heart sounds: Normal heart sounds. No murmur heard. Pulmonary:     Effort: Pulmonary effort is normal. No respiratory distress.     Breath sounds: No stridor. Wheezing and rhonchi (scattered) present. No rales.  Abdominal:     Palpations: Abdomen is soft.     Tenderness: There is no abdominal tenderness.  Musculoskeletal:        General: Normal range of motion.     Cervical back: Normal range of motion and neck supple.  Skin:    General: Skin is warm and dry.  Neurological:     Mental Status: He is alert and oriented to person, place, and time.     Deep Tendon Reflexes: Reflexes are normal and symmetric.  Psychiatric:        Behavior: Behavior  normal.        Thought Content: Thought content normal.        Judgment: Judgment normal.      Assessment & Plan:   Marvell was seen today for cold exposure.  Diagnoses and all orders for this visit:  Shortness of breath -     CBC with Differential/Platelet -     CMP14+EGFR -     Urinalysis -     DG Chest 2 View; Future -     cefTRIAXone (ROCEPHIN) injection 1 g -     betamethasone acetate-betamethasone sodium phosphate (CELESTONE) injection 6 mg -     Novel Coronavirus, NAA ([facility])  Other orders -     levofloxacin (LEVAQUIN) 500 MG tablet; Take 1 tablet (500 mg total) by mouth daily. For 10 days       I am having Dam L. Kaine start on levofloxacin. I am also having him maintain his multivitamin, fish oil-omega-3 fatty acids, CINNAMON PO, aspirin EC, UltiCare Mini Pen Needles, pravastatin, Toujeo SoloStar, and Qtern. We administered cefTRIAXone and betamethasone acetate-betamethasone sodium phosphate.  Allergies as of 06/19/2021   No Known Allergies      Medication List        Accurate as of June 19, 2021  4:35 PM. If you have any questions, ask your nurse or doctor.          aspirin EC 81 MG tablet Take 81 mg by mouth daily.   CINNAMON PO Take 1,000 mg by mouth daily.   fish oil-omega-3 fatty acids 1000 MG capsule Take 1 g by mouth daily.   levofloxacin 500 MG tablet Commonly known as: LEVAQUIN Take 1 tablet (500 mg total) by mouth daily. For 10 days Started by: Claretta Fraise, MD   multivitamin tablet Take 1 tablet by mouth daily.   pravastatin 40 MG tablet Commonly known as: PRAVACHOL Take 1 tablet (40 mg total) by mouth daily.   Qtern 10-5 MG Tabs Generic drug: Dapagliflozin-sAXagliptin Take 1 tablet by mouth in the morning. Cancel onglyza RX.   Toujeo SoloStar 300 UNIT/ML Solostar Pen Generic drug: insulin glargine (1 Unit Dial) Inject 10 Units into the skin at bedtime.   UltiCare Mini Pen Needles 31G X 6 MM Misc Generic drug:  Insulin Pen Needle USE AS DIRECTED AT BEDTIME         Follow-up: Return if symptoms worsen or fail to improve.  Claretta Fraise, M.D.

## 2021-06-20 DIAGNOSIS — E782 Mixed hyperlipidemia: Secondary | ICD-10-CM

## 2021-06-20 DIAGNOSIS — E1169 Type 2 diabetes mellitus with other specified complication: Secondary | ICD-10-CM | POA: Diagnosis not present

## 2021-06-20 LAB — CMP14+EGFR
ALT: 15 IU/L (ref 0–44)
AST: 16 IU/L (ref 0–40)
Albumin/Globulin Ratio: 1.2 (ref 1.2–2.2)
Albumin: 4.1 g/dL (ref 3.7–4.7)
Alkaline Phosphatase: 87 IU/L (ref 44–121)
BUN/Creatinine Ratio: 19 (ref 10–24)
BUN: 20 mg/dL (ref 8–27)
Bilirubin Total: 0.5 mg/dL (ref 0.0–1.2)
CO2: 23 mmol/L (ref 20–29)
Calcium: 9.9 mg/dL (ref 8.6–10.2)
Chloride: 99 mmol/L (ref 96–106)
Creatinine, Ser: 1.03 mg/dL (ref 0.76–1.27)
Globulin, Total: 3.3 g/dL (ref 1.5–4.5)
Glucose: 104 mg/dL — ABNORMAL HIGH (ref 70–99)
Potassium: 4.6 mmol/L (ref 3.5–5.2)
Sodium: 141 mmol/L (ref 134–144)
Total Protein: 7.4 g/dL (ref 6.0–8.5)
eGFR: 77 mL/min/{1.73_m2} (ref >59)

## 2021-06-20 LAB — CBC WITH DIFFERENTIAL/PLATELET
Basophils Absolute: 0 10*3/uL (ref 0.0–0.2)
Basos: 0 %
EOS (ABSOLUTE): 0 10*3/uL (ref 0.0–0.4)
Eos: 0 %
Hematocrit: 39.4 % (ref 37.5–51.0)
Hemoglobin: 13 g/dL (ref 13.0–17.7)
Immature Grans (Abs): 0 10*3/uL (ref 0.0–0.1)
Immature Granulocytes: 0 %
Lymphocytes Absolute: 1.6 10*3/uL (ref 0.7–3.1)
Lymphs: 12 %
MCH: 27.9 pg (ref 26.6–33.0)
MCHC: 33 g/dL (ref 31.5–35.7)
MCV: 85 fL (ref 79–97)
Monocytes Absolute: 1.1 10*3/uL — ABNORMAL HIGH (ref 0.1–0.9)
Monocytes: 8 %
Neutrophils Absolute: 10.4 10*3/uL — ABNORMAL HIGH (ref 1.4–7.0)
Neutrophils: 80 %
Platelets: 305 10*3/uL (ref 150–450)
RBC: 4.66 x10E6/uL (ref 4.14–5.80)
RDW: 12.9 % (ref 11.6–15.4)
WBC: 13.2 10*3/uL — ABNORMAL HIGH (ref 3.4–10.8)

## 2021-06-20 LAB — NOVEL CORONAVIRUS, NAA: SARS-CoV-2, NAA: NOT DETECTED

## 2021-08-11 ENCOUNTER — Ambulatory Visit (INDEPENDENT_AMBULATORY_CARE_PROVIDER_SITE_OTHER): Payer: PPO | Admitting: Family Medicine

## 2021-08-11 ENCOUNTER — Encounter: Payer: Self-pay | Admitting: Family Medicine

## 2021-08-11 ENCOUNTER — Other Ambulatory Visit: Payer: Self-pay | Admitting: Family Medicine

## 2021-08-11 VITALS — BP 131/55 | HR 73 | Ht 73.0 in | Wt 139.0 lb

## 2021-08-11 DIAGNOSIS — E782 Mixed hyperlipidemia: Secondary | ICD-10-CM

## 2021-08-11 DIAGNOSIS — E1169 Type 2 diabetes mellitus with other specified complication: Secondary | ICD-10-CM

## 2021-08-11 DIAGNOSIS — Z794 Long term (current) use of insulin: Secondary | ICD-10-CM

## 2021-08-11 DIAGNOSIS — I10 Essential (primary) hypertension: Secondary | ICD-10-CM

## 2021-08-11 LAB — BAYER DCA HB A1C WAIVED: HB A1C (BAYER DCA - WAIVED): 6.8 % — ABNORMAL HIGH (ref 4.8–5.6)

## 2021-08-11 MED ORDER — TOUJEO SOLOSTAR 300 UNIT/ML ~~LOC~~ SOPN: 10.0000 [IU] | PEN_INJECTOR | Freq: Every day | SUBCUTANEOUS | 3 refills | Status: DC

## 2021-08-11 MED ORDER — PRAVASTATIN SODIUM 40 MG PO TABS: 40.0000 mg | ORAL_TABLET | Freq: Every day | ORAL | 3 refills | Status: DC

## 2021-08-11 MED ORDER — QTERN 10-5 MG PO TABS: 1.0000 | ORAL_TABLET | Freq: Every morning | ORAL | 4 refills | Status: DC

## 2021-08-11 NOTE — Progress Notes (Signed)
? ?BP (!) 131/55   Pulse 73   Ht '6\' 1"'$  (1.854 m)   Wt 139 lb (63 kg)   SpO2 100%   BMI 18.34 kg/m?   ? ?Subjective:  ? ?Patient ID: John Reese, male    DOB: [birth year], 71 y.o.   MRN: [id number] ? ?HPI: ?John Reese is a 71 y.o. male presenting on 08/11/2021 for No chief complaint on file. ? ? ?HPI ?Type 2 diabetes mellitus ?Patient comes in today for recheck of his diabetes. Patient has been currently taking toujeo and qtern. Patient is not currently on an ACE inhibitor/ARB. Patient has not seen an ophthalmologist this year. Patient denies any issues with their feet. The symptom started onset as an adult htn and hld ARE RELATED TO DM  ? ?Hypertension ?Patient is currently on diet controlled, and their blood pressure today is 131/55. Patient denies any lightheadedness or dizziness. Patient denies headaches, blurred vision, chest pains, shortness of breath, or weakness. Denies any side effects from medication and is content with current medication.  ? ?Hyperlipidemia ?Patient is coming in for recheck of his hyperlipidemia. The patient is currently taking pravastatin and fish oil. They deny any issues with myalgias or history of liver damage from it. They deny any focal numbness or weakness or chest pain.  ? ?Relevant past medical, surgical, family and social history reviewed and updated as indicated. Interim medical history since our last visit reviewed. ?Allergies and medications reviewed and updated. ? ?Review of Systems  ?Constitutional:  Negative for chills and fever.  ?Eyes:  Negative for visual disturbance.  ?Respiratory:  Negative for shortness of breath and wheezing.   ?Cardiovascular:  Negative for chest pain and leg swelling.  ?Musculoskeletal:  Negative for back pain and gait problem.  ?Skin:  Negative for rash.  ?Neurological:  Negative for dizziness, weakness and light-headedness.  ?All other systems reviewed and are negative. ? ?Per HPI unless specifically indicated above ? ? ?Allergies as  of 08/11/2021   ?No Known Allergies ?  ? ?  ?Medication List  ?  ? ?  ? Accurate as of August 11, 2021  8:21 AM. If you have any questions, ask your nurse or doctor.  ?  ?  ? ?  ? ?STOP taking these medications   ? ?levofloxacin 500 MG tablet ?Commonly known as: LEVAQUIN ?Stopped by: Worthy Rancher, MD ?  ? ?  ? ?TAKE these medications   ? ?aspirin EC 81 MG tablet ?Take 81 mg by mouth daily. ?  ?CINNAMON PO ?Take 1,000 mg by mouth daily. ?  ?fish oil-omega-3 fatty acids 1000 MG capsule ?Take 1 g by mouth daily. ?  ?multivitamin tablet ?Take 1 tablet by mouth daily. ?  ?pravastatin 40 MG tablet ?Commonly known as: PRAVACHOL ?Take 1 tablet (40 mg total) by mouth daily. ?  ?Qtern 10-5 MG Tabs ?Generic drug: Dapagliflozin-sAXagliptin ?Take 1 tablet by mouth in the morning. Cancel onglyza RX. ?  ?Toujeo SoloStar 300 UNIT/ML Solostar Pen ?Generic drug: insulin glargine (1 Unit Dial) ?Inject 10 Units into the skin at bedtime. ?  ?UltiCare Mini Pen Needles 31G X 6 MM Misc ?Generic drug: Insulin Pen Needle ?USE AS DIRECTED AT BEDTIME ?  ? ?  ? ? ? ?Objective:  ? ?BP (!) 131/55   Pulse 73   Ht '6\' 1"'$  (1.854 m)   Wt 139 lb (63 kg)   SpO2 100%   BMI 18.34 kg/m?   ?Wt Readings from Last 3 Encounters:  ?08/11/21  139 lb (63 kg)  ?06/19/21 131 lb (59.4 kg)  ?05/10/21 134 lb (60.8 kg)  ?  ?Physical Exam ?Vitals and nursing note reviewed.  ?Constitutional:   ?   General: He is not in acute distress. ?   Appearance: He is well-developed. He is not diaphoretic.  ?Eyes:  ?   General: No scleral icterus. ?   Conjunctiva/sclera: Conjunctivae normal.  ?Neck:  ?   Thyroid: No thyromegaly.  ?Cardiovascular:  ?   Rate and Rhythm: Normal rate and regular rhythm.  ?   Heart sounds: Normal heart sounds. No murmur heard. ?Pulmonary:  ?   Effort: Pulmonary effort is normal. No respiratory distress.  ?   Breath sounds: Normal breath sounds. No wheezing.  ?Musculoskeletal:     ?   General: No swelling. Normal range of motion.  ?   Cervical  back: Neck supple.  ?Lymphadenopathy:  ?   Cervical: No cervical adenopathy.  ?Skin: ?   General: Skin is warm and dry.  ?   Findings: No rash.  ?Neurological:  ?   Mental Status: He is alert and oriented to person, place, and time.  ?   Coordination: Coordination normal.  ?Psychiatric:     ?   Behavior: Behavior normal.  ? ? ? ? ?Assessment & Plan:  ? ?Problem List Items Addressed This Visit   ? ?  ? Cardiovascular and Mediastinum  ? Essential hypertension  ? Relevant Medications  ? pravastatin (PRAVACHOL) 40 MG tablet  ?  ? Endocrine  ? Type 2 diabetes mellitus with other specified complication ([place]) - Primary  ? Relevant Medications  ? pravastatin (PRAVACHOL) 40 MG tablet  ? insulin glargine, 1 Unit Dial, (TOUJEO SOLOSTAR) 300 UNIT/ML Solostar Pen  ? Dapagliflozin-sAXagliptin (QTERN) 10-5 MG TABS  ? Other Relevant Orders  ? Bayer DCA Hb A1c Waived  ?  ? Other  ? Mixed hyperlipidemia  ? Relevant Medications  ? pravastatin (PRAVACHOL) 40 MG tablet  ?A1c looks good at 6.8, continue current medicine.  No changes ? ?Follow up plan: ?Return in about 3 months (around 11/10/2021), or if symptoms worsen or fail to improve, for dm and htn and hld. ? ?Counseling provided for all of the vaccine components ?Orders Placed This Encounter  ?Procedures  ? Bayer DCA Hb A1c Waived  ? ? ?Caryl Pina, MD ?[place] ?08/11/2021, 8:21 AM ? ? ? ? ?

## 2021-08-11 NOTE — Telephone Encounter (Signed)
Please let him know that I sent Glyxambi instead of Vergia Alcon because of insurance saying that the Vergia Alcon is not covered anymore ?

## 2021-08-31 ENCOUNTER — Ambulatory Visit (INDEPENDENT_AMBULATORY_CARE_PROVIDER_SITE_OTHER): Payer: PPO

## 2021-08-31 VITALS — Wt 139.0 lb

## 2021-08-31 DIAGNOSIS — Z0000 Encounter for general adult medical examination without abnormal findings: Secondary | ICD-10-CM

## 2021-08-31 DIAGNOSIS — M48061 Spinal stenosis, lumbar region without neurogenic claudication: Secondary | ICD-10-CM | POA: Insufficient documentation

## 2021-08-31 DIAGNOSIS — M5416 Radiculopathy, lumbar region: Secondary | ICD-10-CM | POA: Insufficient documentation

## 2021-08-31 DIAGNOSIS — M5440 Lumbago with sciatica, unspecified side: Secondary | ICD-10-CM | POA: Insufficient documentation

## 2021-08-31 NOTE — Progress Notes (Signed)
? ?Subjective:  ? John Reese is a 71 y.o. male who presents for Medicare Annual/Subsequent preventive examination. ? ?Virtual Visit via Telephone Note ? ?I connected with  John Reese on 08/31/21 at  9:45 AM EDT by telephone and verified that I am speaking with the correct person using two identifiers. ? ?Location: ?Patient: Home ?Provider: [facility] ?Persons participating in the virtual visit: patient/Nurse Health Advisor ?  ?I discussed the limitations, risks, security and privacy concerns of performing an evaluation and management service by telephone and the availability of in person appointments. The patient expressed understanding and agreed to proceed. ? ?Interactive audio and video telecommunications were attempted between this nurse and patient, however failed, due to patient having technical difficulties OR patient did not have access to video capability.  We continued and completed visit with audio only. ? ?Some vital signs may be absent or patient reported.  ? ?Turner Baillie Dionne Ano, LPN  ? ?Review of Systems    ? ?Cardiac Risk Factors include: advanced age (>23mn, >>27women);diabetes mellitus;dyslipidemia;hypertension;male gender ? ?   ?Objective:  ?  ?Today's Vitals  ? 08/31/21 0956  ?Weight: 139 lb (63 kg)  ? ?Body mass index is 18.34 kg/m?. ? ? ?  08/31/2021  ? 10:02 AM 04/12/2021  ?  9:31 AM 11/01/2020  ?  1:09 PM 10/04/2020  ? 11:05 AM 08/09/2020  ?  8:23 AM 07/06/2020  ?  8:52 AM 02/02/2020  ? 10:41 AM  ?Advanced Directives  ?Does Patient Have a Medical Advance Directive? No Yes No No Yes No No  ?Type of Advance Directive  Living will   Healthcare Power of Attorney    ?Does patient want to make changes to medical advance directive?     No - Patient declined    ?Would patient like information on creating a medical advance directive? No - Patient declined  No - Patient declined      ? ? ?Current Medications (verified) ?Outpatient Encounter Medications as of 08/31/2021  ?Medication Sig  ? aspirin EC 81 MG  tablet Take 81 mg by mouth daily.  ? CINNAMON PO Take 1,000 mg by mouth daily.  ? fish oil-omega-3 fatty acids 1000 MG capsule Take 1 g by mouth daily.  ? insulin glargine, 1 Unit Dial, (TOUJEO SOLOSTAR) 300 UNIT/ML Solostar Pen Inject 10 Units into the skin at bedtime.  ? Insulin Pen Needle (ULTICARE MINI PEN NEEDLES) 31G X 6 MM MISC USE AS DIRECTED AT BEDTIME  ? Multiple Vitamin (MULTIVITAMIN) tablet Take 1 tablet by mouth daily.  ? pravastatin (PRAVACHOL) 40 MG tablet Take 1 tablet (40 mg total) by mouth daily.  ? UNABLE TO FIND Med Name: [facility]  ? [DISCONTINUED] Empagliflozin-linaGLIPtin (GLYXAMBI) 25-5 MG TABS Take 1 tablet by mouth daily at 12 noon.  ? ?No facility-administered encounter medications on file as of 08/31/2021.  ? ? ?Allergies (verified) ?Patient has no known allergies.  ? ?History: ?Past Medical History:  ?Diagnosis Date  ? Auditory disturbance   ? Decreased auditory acuity  ? 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 Tip  ? Diabetes ([place]   ? on meds  ? Dyslipidemia   ? Hyperlipidemia   ? on meds  ? Tremor   ? has a deep brain stimulator in place to treat -  ? Trigeminal neuralgia   ? Right, V2 distribution  ? ?Past Surgical History:  ?Procedure Laterality Date  ? COLONOSCOPY  2021  ? FINGER AMPUTATION  Right 1968  ? RIGHT pointer finger end  ? INNER EAR SURGERY Right   ? Ear drum repair  ? POLYPECTOMY  2021  ? TA's  ? PULSE GENERATOR IMPLANT Bilateral 03/30/2016  ? Procedure: BILATERAL PLACEMENT OF IMPLANTABLE PULSE GENERATOR;  Surgeon: Erline Levine, MD;  Location: [place];  Service: Neurosurgery;  Laterality: Bilateral;  BILATERAL PLACEMENT OF IMPLANTABLE PULSE GENERATOR  ? SUBTHALAMIC STIMULATOR BATTERY REPLACEMENT N/A 11/01/2020  ? Procedure: Implantable pulse generator battery change, Right and left chest;  Surgeon: Erline Levine, MD;  Location: [place];  Service: Neurosurgery;  Laterality: N/A;  3C/RM 21  ? SUBTHALAMIC STIMULATOR  INSERTION Bilateral 03/22/2016  ? Procedure: BILATERAL DEEP BRAIN STIMULATOR PLACEMENT WITH STARFIX WITH DR. [name];  Surgeon: Erline Levine, MD;  Location: [facility];  Service: Neurosurgery;  Laterality: Bilateral;  ? ?Family History  ?Problem Relation Age of Onset  ? Colon cancer Mother 43  ? Stroke Mother   ? Colon polyps Mother 69  ? Stroke Father   ? Peripheral vascular disease Sister   ? Healthy Sister   ? Heart attack Brother   ? Healthy Brother   ? Healthy Brother   ? Healthy Brother   ? Healthy Brother   ? Healthy Daughter   ? Healthy Son   ? Esophageal cancer Neg Hx   ? Stomach cancer Neg Hx   ? Rectal cancer Neg Hx   ? ?Social History  ? ?Socioeconomic History  ? Marital status: Married  ?  Spouse name: Not on file  ? Number of children: 2  ? Years of education: HS  ? Highest education level: 12th grade  ?Occupational History  ? Occupation: Works in Maintenance  ?  Employer: OTHER  ?  Comment: RETIRED  ?Tobacco Use  ? Smoking status: Never  ? Smokeless tobacco: Never  ?Vaping Use  ? Vaping Use: Never used  ?Substance and Sexual Activity  ? Alcohol use: No  ? Drug use: No  ? Sexual activity: Not on file  ?Other Topics Concern  ? Not on file  ?Social History Narrative  ? Patient is right handed.  ? Patient drinks 2 cups coffee daily.  ? ?Social Determinants of Health  ? ?Financial Resource Strain: Low Risk   ? Difficulty of Paying Living Expenses: Not hard at all  ?Food Insecurity: No Food Insecurity  ? Worried About Charity fundraiser in the Last Year: Never true  ? Ran Out of Food in the Last Year: Never true  ?Transportation Needs: No Transportation Needs  ? Lack of Transportation (Medical): No  ? Lack of Transportation (Non-Medical): No  ?Physical Activity: Sufficiently Active  ? Days of Exercise per Week: 7 days  ? Minutes of Exercise per Session: 30 min  ?Stress: No Stress Concern Present  ? Feeling of Stress : Not at all  ?Social Connections: Socially Integrated  ? Frequency of Communication with Friends  and Family: More than three times a week  ? Frequency of Social Gatherings with Friends and Family: More than three times a week  ? Attends Religious Services: More than 4 times per year  ? Active Member of Clubs or Organizations: Yes  ? Attends Archivist Meetings: More than 4 times per year  ? Marital Status: Married  ? ? ?Tobacco Counseling ?Counseling given: Not Answered ? ? ?Clinical Intake: ? ?Pre-visit preparation completed: Yes ? ?Pain : No/denies pain ? ?  ? ?BMI - recorded: 18.34 ?Nutritional Status: BMI <19  Underweight ?Nutritional Risks:  None ?Diabetes: Yes ?CBG done?: No ?Did pt. bring in CBG monitor from home?: No ? ?How often do you need to have someone help you when you read instructions, pamphlets, or other written materials from your doctor or pharmacy?: 1 - Never ? ?Diabetic? Nutrition Risk Assessment: ? ?Has the patient had any N/V/D within the last 2 months?  No  ?Does the patient have any non-healing wounds?  No  ?Has the patient had any unintentional weight loss or weight gain?  No  ? ?Diabetes: ? ?Is the patient diabetic?  Yes  ?If diabetic, was a CBG obtained today?  No  ?Did the patient bring in their glucometer from home?  No  ?How often do you monitor your CBG's? BID - 96 this am per patient fasting.  ? ?Financial Strains and Diabetes Management: ? ?Are you having any financial strains with the device, your supplies or your medication? No .  ?Does the patient want to be seen by Chronic Care Management for management of their diabetes?  No  ?Would the patient like to be referred to a Nutritionist or for Diabetic Management?  No  ? ?Diabetic Exams: ? ?Diabetic Eye Exam: Completed 07/24/2021.  ? ?Diabetic Foot Exam:  Pt has been advised about the importance in completing this exam. Pt is scheduled for diabetic foot exam on next appt with Dr [name] since he no longer sees endocrinologist.   ? ?Interpreter Needed?: No ? ?Information entered by :: Jasha Hodzic Glick, LPN ? ? ?Activities of  Daily Living ? ?  08/31/2021  ? 10:03 AM 11/01/2020  ?  1:19 PM  ?In your present state of health, do you have any difficulty performing the following activities:  ?Hearing? 1   ?Vision? 0   ?Difficulty concent

## 2021-08-31 NOTE — Patient Instructions (Signed)
John Reese , ?Thank you for taking time to come for your Medicare Wellness Visit. I appreciate your ongoing commitment to your health goals. Please review the following plan we discussed and let me know if I can assist you in the future.  ? ?Screening recommendations/referrals: ?Colonoscopy: Done 01/23/2021 - Repeat in 3 years ?Recommended yearly ophthalmology/optometry visit for glaucoma screening and checkup ?Recommended yearly dental visit for hygiene and checkup ? ?Vaccinations: ?Influenza vaccine: Done 02/06/2021 - Repeat annually ?Pneumococcal vaccine:  Done 04/07/2015 & 12/30/2018 ?Tdap vaccine: Done 08/23/2017 - Repeat in 10 years ?Shingles vaccine: Done 07/08/2020 & 09/16/2020   ?Covid-19: Done 1/17/221, 05/28/2019, 01/07/2020, & 03/25/2021 ? ?Advanced directives: Advance directive discussed with you today. Even though you declined this today, please call our office should you change your mind, and we can give you the proper paperwork for you to fill out.  ? ?Conditions/risks identified: Aim for 30 minutes of exercise or brisk walking, 6-8 glasses of water, and 5 servings of fruits and vegetables each day.  ? ?Next appointment: Follow up in one year for your annual wellness visit.  ? ?Preventive Care 55 Years and Older, Male ? ?Preventive care refers to lifestyle choices and visits with your health care provider that can promote health and wellness. ?What does preventive care include? ?A yearly physical exam. This is also called an annual well check. ?Dental exams once or twice a year. ?Routine eye exams. Ask your health care provider how often you should have your eyes checked. ?Personal lifestyle choices, including: ?Daily care of your teeth and gums. ?Regular physical activity. ?Eating a healthy diet. ?Avoiding tobacco and drug use. ?Limiting alcohol use. ?Practicing safe sex. ?Taking low doses of aspirin every day. ?Taking vitamin and mineral supplements as recommended by your health care provider. ?What happens  during an annual well check? ?The services and screenings done by your health care provider during your annual well check will depend on your age, overall health, lifestyle risk factors, and family history of disease. ?Counseling  ?Your health care provider may ask you questions about your: ?Alcohol use. ?Tobacco use. ?Drug use. ?Emotional well-being. ?Home and relationship well-being. ?Sexual activity. ?Eating habits. ?History of falls. ?Memory and ability to understand (cognition). ?Work and work Statistician. ?Screening  ?You may have the following tests or measurements: ?Height, weight, and BMI. ?Blood pressure. ?Lipid and cholesterol levels. These may be checked every 5 years, or more frequently if you are over 50 years old. ?Skin check. ?Lung cancer screening. You may have this screening every year starting at age 22 if you have a 30-pack-year history of smoking and currently smoke or have quit within the past 15 years. ?Fecal occult blood test (FOBT) of the stool. You may have this test every year starting at age 52. ?Flexible sigmoidoscopy or colonoscopy. You may have a sigmoidoscopy every 5 years or a colonoscopy every 10 years starting at age 87. ?Prostate cancer screening. Recommendations will vary depending on your family history and other risks. ?Hepatitis C blood test. ?Hepatitis B blood test. ?Sexually transmitted disease (STD) testing. ?Diabetes screening. This is done by checking your blood sugar (glucose) after you have not eaten for a while (fasting). You may have this done every 1-3 years. ?Abdominal aortic aneurysm (AAA) screening. You may need this if you are a current or former smoker. ?Osteoporosis. You may be screened starting at age 65 if you are at high risk. ?Talk with your health care provider about your test results, treatment options, and if necessary,  the need for more tests. ?Vaccines  ?Your health care provider may recommend certain vaccines, such as: ?Influenza vaccine. This is  recommended every year. ?Tetanus, diphtheria, and acellular pertussis (Tdap, Td) vaccine. You may need a Td booster every 10 years. ?Zoster vaccine. You may need this after age 52. ?Pneumococcal 13-valent conjugate (PCV13) vaccine. One dose is recommended after age 98. ?Pneumococcal polysaccharide (PPSV23) vaccine. One dose is recommended after age 16. ?Talk to your health care provider about which screenings and vaccines you need and how often you need them. ?This information is not intended to replace advice given to you by your health care provider. Make sure you discuss any questions you have with your health care provider. ?Document Released: 05/06/2015 Document Revised: 12/28/2015 Document Reviewed: 02/08/2015 ?Elsevier Interactive Patient Education ? 2017 [place]. ? ?Fall Prevention in the Home ?Falls can cause injuries. They can happen to people of all ages. There are many things you can do to make your home safe and to help prevent falls. ?What can I do on the outside of my home? ?Regularly fix the edges of walkways and driveways and fix any cracks. ?Remove anything that might make you trip as you walk through a door, such as a raised step or threshold. ?Trim any bushes or trees on the path to your home. ?Use bright outdoor lighting. ?Clear any walking paths of anything that might make someone trip, such as rocks or tools. ?Regularly check to see if handrails are loose or broken. Make sure that both sides of any steps have handrails. ?Any raised decks and porches should have guardrails on the edges. ?Have any leaves, snow, or ice cleared regularly. ?Use sand or salt on walking paths during winter. ?Clean up any spills in your garage right away. This includes oil or grease spills. ?What can I do in the bathroom? ?Use night lights. ?Install grab bars by the toilet and in the tub and shower. Do not use towel bars as grab bars. ?Use non-skid mats or decals in the tub or shower. ?If you need to sit down in  the shower, use a plastic, non-slip stool. ?Keep the floor dry. Clean up any water that spills on the floor as soon as it happens. ?Remove soap buildup in the tub or shower regularly. ?Attach bath mats securely with double-sided non-slip rug tape. ?Do not have throw rugs and other things on the floor that can make you trip. ?What can I do in the bedroom? ?Use night lights. ?Make sure that you have a light by your bed that is easy to reach. ?Do not use any sheets or blankets that are too big for your bed. They should not hang down onto the floor. ?Have a firm chair that has side arms. You can use this for support while you get dressed. ?Do not have throw rugs and other things on the floor that can make you trip. ?What can I do in the kitchen? ?Clean up any spills right away. ?Avoid walking on wet floors. ?Keep items that you use a lot in easy-to-reach places. ?If you need to reach something above you, use a strong step stool that has a grab bar. ?Keep electrical cords out of the way. ?Do not use floor polish or wax that makes floors slippery. If you must use wax, use non-skid floor wax. ?Do not have throw rugs and other things on the floor that can make you trip. ?What can I do with my stairs? ?Do not leave any items on  the stairs. ?Make sure that there are handrails on both sides of the stairs and use them. Fix handrails that are broken or loose. Make sure that handrails are as long as the stairways. ?Check any carpeting to make sure that it is firmly attached to the stairs. Fix any carpet that is loose or worn. ?Avoid having throw rugs at the top or bottom of the stairs. If you do have throw rugs, attach them to the floor with carpet tape. ?Make sure that you have a light switch at the top of the stairs and the bottom of the stairs. If you do not have them, ask someone to add them for you. ?What else can I do to help prevent falls? ?Wear shoes that: ?Do not have high heels. ?Have rubber bottoms. ?Are comfortable  and fit you well. ?Are closed at the toe. Do not wear sandals. ?If you use a stepladder: ?Make sure that it is fully opened. Do not climb a closed stepladder. ?Make sure that both sides of the stepladder ar

## 2021-10-13 ENCOUNTER — Encounter: Payer: PPO | Admitting: Neurology

## 2021-11-22 ENCOUNTER — Other Ambulatory Visit: Payer: PPO

## 2021-11-22 DIAGNOSIS — E1169 Type 2 diabetes mellitus with other specified complication: Secondary | ICD-10-CM | POA: Diagnosis not present

## 2021-11-22 DIAGNOSIS — E782 Mixed hyperlipidemia: Secondary | ICD-10-CM

## 2021-11-22 DIAGNOSIS — I10 Essential (primary) hypertension: Secondary | ICD-10-CM

## 2021-11-22 LAB — BAYER DCA HB A1C WAIVED: HB A1C (BAYER DCA - WAIVED): 7.2 % — ABNORMAL HIGH (ref 4.8–5.6)

## 2021-11-23 LAB — CMP14+EGFR
ALT: 13 IU/L (ref 0–44)
AST: 15 IU/L (ref 0–40)
Albumin/Globulin Ratio: 2.3 — ABNORMAL HIGH (ref 1.2–2.2)
Albumin: 4.6 g/dL (ref 3.8–4.8)
Alkaline Phosphatase: 54 IU/L (ref 44–121)
BUN/Creatinine Ratio: 16 (ref 10–24)
BUN: 17 mg/dL (ref 8–27)
Bilirubin Total: 0.4 mg/dL (ref 0.0–1.2)
CO2: 26 mmol/L (ref 20–29)
Calcium: 10.3 mg/dL — ABNORMAL HIGH (ref 8.6–10.2)
Chloride: 103 mmol/L (ref 96–106)
Creatinine, Ser: 1.08 mg/dL (ref 0.76–1.27)
Globulin, Total: 2 g/dL (ref 1.5–4.5)
Glucose: 106 mg/dL — ABNORMAL HIGH (ref 70–99)
Potassium: 5.5 mmol/L — ABNORMAL HIGH (ref 3.5–5.2)
Sodium: 142 mmol/L (ref 134–144)
Total Protein: 6.6 g/dL (ref 6.0–8.5)
eGFR: 73 mL/min/{1.73_m2} (ref >59)

## 2021-11-23 LAB — LIPID PANEL
Chol/HDL Ratio: 3.3 ratio (ref 0.0–5.0)
Cholesterol, Total: 167 mg/dL (ref 100–199)
HDL: 50 mg/dL (ref >39)
LDL Chol Calc (NIH): 106 mg/dL — ABNORMAL HIGH (ref 0–99)
Triglycerides: 52 mg/dL (ref 0–149)
VLDL Cholesterol Cal: 11 mg/dL (ref 5–40)

## 2021-11-23 LAB — CBC WITH DIFFERENTIAL/PLATELET
Basophils Absolute: 0 10*3/uL (ref 0.0–0.2)
Basos: 1 %
EOS (ABSOLUTE): 0.2 10*3/uL (ref 0.0–0.4)
Eos: 4 %
Hematocrit: 40.1 % (ref 37.5–51.0)
Hemoglobin: 13.4 g/dL (ref 13.0–17.7)
Immature Grans (Abs): 0 10*3/uL (ref 0.0–0.1)
Immature Granulocytes: 0 %
Lymphocytes Absolute: 1.4 10*3/uL (ref 0.7–3.1)
Lymphs: 32 %
MCH: 28.5 pg (ref 26.6–33.0)
MCHC: 33.4 g/dL (ref 31.5–35.7)
MCV: 85 fL (ref 79–97)
Monocytes Absolute: 0.3 10*3/uL (ref 0.1–0.9)
Monocytes: 8 %
Neutrophils Absolute: 2.5 10*3/uL (ref 1.4–7.0)
Neutrophils: 55 %
Platelets: 160 10*3/uL (ref 150–450)
RBC: 4.71 x10E6/uL (ref 4.14–5.80)
RDW: 12.9 % (ref 11.6–15.4)
WBC: 4.5 10*3/uL (ref 3.4–10.8)

## 2021-11-24 ENCOUNTER — Encounter: Payer: Self-pay | Admitting: Family Medicine

## 2021-11-24 ENCOUNTER — Ambulatory Visit (INDEPENDENT_AMBULATORY_CARE_PROVIDER_SITE_OTHER): Payer: PPO | Admitting: Family Medicine

## 2021-11-24 VITALS — BP 129/72 | HR 81 | Temp 97.9°F | Ht 73.0 in | Wt 141.0 lb

## 2021-11-24 DIAGNOSIS — I10 Essential (primary) hypertension: Secondary | ICD-10-CM

## 2021-11-24 DIAGNOSIS — Z794 Long term (current) use of insulin: Secondary | ICD-10-CM | POA: Diagnosis not present

## 2021-11-24 DIAGNOSIS — E1169 Type 2 diabetes mellitus with other specified complication: Secondary | ICD-10-CM

## 2021-11-24 DIAGNOSIS — E782 Mixed hyperlipidemia: Secondary | ICD-10-CM | POA: Diagnosis not present

## 2021-11-24 MED ORDER — TOUJEO SOLOSTAR 300 UNIT/ML ~~LOC~~ SOPN: 10.0000 [IU] | PEN_INJECTOR | Freq: Every day | SUBCUTANEOUS | 3 refills | Status: DC

## 2021-11-24 NOTE — Progress Notes (Signed)
BP 129/72   Pulse 81   Temp 97.9 F (36.6 C)   Ht _0  (1.854 m)   Wt 141 lb (64 kg)   SpO2 99%   BMI 18.60 kg/m    Subjective:   Patient ID: John Reese, male    DOB: 1951/01/04, 71 y.o.   MRN: [id number]  HPI: John Reese is a 71 y.o. male presenting on 11/24/2021 for Medical Management of Chronic Issues and Diabetes   HPI Type 2 diabetes mellitus Patient comes in today for recheck of his diabetes. Patient has been currently taking toujeo. Patient is not currently on an ACE inhibitor/ARB. Patient has not seen an ophthalmologist this year. Patient denies any issues with their feet. The symptom started onset as an adult htn and hld ARE RELATED TO DM   Hypertension Patient is currently on no medicine, and their blood pressure today is 129/72. Patient denies any lightheadedness or dizziness. Patient denies headaches, blurred vision, chest pains, shortness of breath, or weakness. Denies any side effects from medication and is content with current medication.   Hyperlipidemia Patient is coming in for recheck of his hyperlipidemia. The patient is currently taking pravastatin. They deny any issues with myalgias or history of liver damage from it. They deny any focal numbness or weakness or chest pain.   Relevant past medical, surgical, family and social history reviewed and updated as indicated. Interim medical history since our last visit reviewed. Allergies and medications reviewed and updated.  Review of Systems  Constitutional:  Negative for chills and fever.  Respiratory:  Negative for shortness of breath and wheezing.   Cardiovascular:  Negative for chest pain and leg swelling.  Musculoskeletal:  Negative for back pain and gait problem.  Skin:  Negative for rash.  Neurological:  Negative for dizziness and light-headedness.  All other systems reviewed and are negative.   Per HPI unless specifically indicated above   Allergies as of 11/24/2021   No Known Allergies       Medication List        Accurate as of November 24, 2021  8:36 AM. If you have any questions, ask your nurse or doctor.          aspirin EC 81 MG tablet Take 81 mg by mouth daily.   CINNAMON PO Take 1,000 mg by mouth daily.   fish oil-omega-3 fatty acids 1000 MG capsule Take 1 g by mouth daily.   multivitamin tablet Take 1 tablet by mouth daily.   pravastatin 40 MG tablet Commonly known as: PRAVACHOL Take 1 tablet (40 mg total) by mouth daily.   Toujeo SoloStar 300 UNIT/ML Solostar Pen Generic drug: insulin glargine (1 Unit Dial) Inject 10 Units into the skin at bedtime.   UltiCare Mini Pen Needles 31G X 6 MM Misc Generic drug: Insulin Pen Needle USE AS DIRECTED AT BEDTIME   UNABLE TO FIND Med Name: QTERE sample from [facility] pharmacist         Objective:   BP 129/72   Pulse 81   Temp 97.9 F (36.6 C)   Ht _1  (1.854 m)   Wt 141 lb (64 kg)   SpO2 99%   BMI 18.60 kg/m   Wt Readings from Last 3 Encounters:  11/24/21 141 lb (64 kg)  08/31/21 139 lb (63 kg)  08/11/21 139 lb (63 kg)    Physical Exam Vitals and nursing note reviewed.  Constitutional:      General: He is not in acute  distress.    Appearance: He is well-developed. He is not diaphoretic.  Eyes:     General: No scleral icterus.       Right eye: No discharge.     Conjunctiva/sclera: Conjunctivae normal.     Pupils: Pupils are equal, round, and reactive to light.  Neck:     Thyroid: No thyromegaly.  Cardiovascular:     Rate and Rhythm: Normal rate and regular rhythm.     Heart sounds: Normal heart sounds. No murmur heard. Pulmonary:     Effort: Pulmonary effort is normal. No respiratory distress.     Breath sounds: Normal breath sounds. No wheezing.  Musculoskeletal:        General: No swelling. Normal range of motion.     Cervical back: Neck supple.  Lymphadenopathy:     Cervical: No cervical adenopathy.  Skin:    General: Skin is warm and dry.     Findings: No rash.   Neurological:     Mental Status: He is alert and oriented to person, place, and time.     Coordination: Coordination normal.  Psychiatric:        Behavior: Behavior normal.     Results for orders placed or performed in visit on 11/22/21  CBC with Differential/Platelet  Result Value Ref Range   WBC 4.5 3.4 - 10.8 x10E3/uL   RBC 4.71 4.14 - 5.80 x10E6/uL   Hemoglobin 13.4 13.0 - 17.7 g/dL   Hematocrit 40.1 37.5 - 51.0 %   MCV 85 79 - 97 fL   MCH 28.5 26.6 - 33.0 pg   MCHC 33.4 31.5 - 35.7 g/dL   RDW 12.9 11.6 - 15.4 %   Platelets 160 150 - 450 x10E3/uL   Neutrophils 55 Not Estab. %   Lymphs 32 Not Estab. %   Monocytes 8 Not Estab. %   Eos 4 Not Estab. %   Basos 1 Not Estab. %   Neutrophils Absolute 2.5 1.4 - 7.0 x10E3/uL   Lymphocytes Absolute 1.4 0.7 - 3.1 x10E3/uL   Monocytes Absolute 0.3 0.1 - 0.9 x10E3/uL   EOS (ABSOLUTE) 0.2 0.0 - 0.4 x10E3/uL   Basophils Absolute 0.0 0.0 - 0.2 x10E3/uL   Immature Granulocytes 0 Not Estab. %   Immature Grans (Abs) 0.0 0.0 - 0.1 x10E3/uL  CMP14+EGFR  Result Value Ref Range   Glucose 106 (H) 70 - 99 mg/dL   BUN 17 8 - 27 mg/dL   Creatinine, Ser 1.08 0.76 - 1.27 mg/dL   eGFR 73 >59 mL/min/1.73   BUN/Creatinine Ratio 16 10 - 24   Sodium 142 134 - 144 mmol/L   Potassium 5.5 (H) 3.5 - 5.2 mmol/L   Chloride 103 96 - 106 mmol/L   CO2 26 20 - 29 mmol/L   Calcium 10.3 (H) 8.6 - 10.2 mg/dL   Total Protein 6.6 6.0 - 8.5 g/dL   Albumin 4.6 3.8 - 4.8 g/dL   Globulin, Total 2.0 1.5 - 4.5 g/dL   Albumin/Globulin Ratio 2.3 (H) 1.2 - 2.2   Bilirubin Total 0.4 0.0 - 1.2 mg/dL   Alkaline Phosphatase 54 44 - 121 IU/L   AST 15 0 - 40 IU/L   ALT 13 0 - 44 IU/L  Lipid panel  Result Value Ref Range   Cholesterol, Total 167 100 - 199 mg/dL   Triglycerides 52 0 - 149 mg/dL   HDL 50 >39 mg/dL   VLDL Cholesterol Cal 11 5 - 40 mg/dL   LDL Chol Calc (NIH) 106 (  H) 0 - 99 mg/dL   Chol/HDL Ratio 3.3 0.0 - 5.0 ratio  Bayer DCA Hb A1c Waived  Result  Value Ref Range   HB A1C (BAYER DCA - WAIVED) 7.2 (H) 4.8 - 5.6 %    Assessment & Plan:   Problem List Items Addressed This Visit       Cardiovascular and Mediastinum   Essential hypertension - Primary     Endocrine   Type 2 diabetes mellitus with other specified complication (HCC)   Relevant Medications   insulin glargine, 1 Unit Dial, (TOUJEO SOLOSTAR) 300 UNIT/ML Solostar Pen     Other   Mixed hyperlipidemia    Potassium slightly elevated, recommended hydration.  Patient's blood pressure looks good.  A1c is slightly elevated, recommended diet at this point. Follow up plan: Return in about 3 months (around 02/24/2022), or if symptoms worsen or fail to improve, for Diabetes and hypertension.  Counseling provided for all of the vaccine components No orders of the defined types were placed in this encounter.   Caryl Pina, MD [facility] 11/24/2021, 8:36 AM

## 2022-02-28 ENCOUNTER — Encounter: Payer: Self-pay | Admitting: Family Medicine

## 2022-02-28 ENCOUNTER — Ambulatory Visit (INDEPENDENT_AMBULATORY_CARE_PROVIDER_SITE_OTHER): Payer: PPO | Admitting: Family Medicine

## 2022-02-28 ENCOUNTER — Other Ambulatory Visit: Payer: Self-pay | Admitting: Family Medicine

## 2022-02-28 VITALS — BP 125/68 | HR 74 | Temp 98.3°F | Ht 73.0 in | Wt 138.0 lb

## 2022-02-28 DIAGNOSIS — E1169 Type 2 diabetes mellitus with other specified complication: Secondary | ICD-10-CM

## 2022-02-28 DIAGNOSIS — Z23 Encounter for immunization: Secondary | ICD-10-CM | POA: Diagnosis not present

## 2022-02-28 DIAGNOSIS — E875 Hyperkalemia: Secondary | ICD-10-CM

## 2022-02-28 DIAGNOSIS — I10 Essential (primary) hypertension: Secondary | ICD-10-CM | POA: Diagnosis not present

## 2022-02-28 DIAGNOSIS — E782 Mixed hyperlipidemia: Secondary | ICD-10-CM

## 2022-02-28 DIAGNOSIS — Z794 Long term (current) use of insulin: Secondary | ICD-10-CM

## 2022-02-28 LAB — BMP8+EGFR
BUN/Creatinine Ratio: 19 (ref 10–24)
BUN: 21 mg/dL (ref 8–27)
CO2: 23 mmol/L (ref 20–29)
Calcium: 9.8 mg/dL (ref 8.6–10.2)
Chloride: 100 mmol/L (ref 96–106)
Creatinine, Ser: 1.13 mg/dL (ref 0.76–1.27)
Glucose: 126 mg/dL — ABNORMAL HIGH (ref 70–99)
Potassium: 4.6 mmol/L (ref 3.5–5.2)
Sodium: 139 mmol/L (ref 134–144)
eGFR: 69 mL/min/{1.73_m2} (ref >59)

## 2022-02-28 LAB — BAYER DCA HB A1C WAIVED: HB A1C (BAYER DCA - WAIVED): 7.1 % — ABNORMAL HIGH (ref 4.8–5.6)

## 2022-02-28 MED ORDER — QTERN 10-5 MG PO TABS: 1.0000 | ORAL_TABLET | Freq: Every day | ORAL | 5 refills | Status: DC

## 2022-02-28 NOTE — Telephone Encounter (Signed)
Name from pharmacy: [facility]-5 [place] comment: Alternative Requested:DRUG NOT ON FORMULARY; PLEASE CALL INSURANCE OR CONSIDER ALTERNATIVE.

## 2022-02-28 NOTE — Progress Notes (Signed)
BP 125/68   Pulse 74   Temp 98.3 F (36.8 C)   Ht _0  (1.854 m)   Wt 138 lb (62.6 kg)   SpO2 100%   BMI 18.21 kg/m    Subjective:   Patient ID: John Reese, male    DOB: 06-18-1950, 71 y.o.   MRN: [id number]  HPI: KROSS SWALLOWS is a 71 y.o. male presenting on 02/28/2022 for Medical Management of Chronic Issues   HPI Type 2 diabetes mellitus Patient comes in today for recheck of his diabetes. Patient has been currently taking qtern and toujeo. Patient is not currently on an ACE inhibitor/ARB. Patient has seen an ophthalmologist this year. Patient denies any issues with their feet. The symptom started onset as an adult hypertension and hyperlipidemia ARE RELATED TO DM   Hypertension Patient is currently on no medicine, diet control, and their blood pressure today is 125/68. Patient denies any lightheadedness or dizziness. Patient denies headaches, blurred vision, chest pains, shortness of breath, or weakness. Denies any side effects from medication and is content with current medication.   Hyperlipidemia Patient is coming in for recheck of his hyperlipidemia. The patient is currently taking pravastatin and fish oil. They deny any issues with myalgias or history of liver damage from it. They deny any focal numbness or weakness or chest pain.   Last visit he had slight hypokalemia, will recheck today.  Relevant past medical, surgical, family and social history reviewed and updated as indicated. Interim medical history since our last visit reviewed. Allergies and medications reviewed and updated.  Review of Systems  Constitutional:  Negative for chills and fever.  Eyes:  Negative for visual disturbance.  Respiratory:  Negative for shortness of breath and wheezing.   Cardiovascular:  Negative for chest pain and leg swelling.  Musculoskeletal:  Negative for back pain and gait problem.  Skin:  Negative for rash.  Neurological:  Negative for dizziness, weakness and  light-headedness.  All other systems reviewed and are negative.   Per HPI unless specifically indicated above   Allergies as of 02/28/2022   No Known Allergies      Medication List        Accurate as of February 28, 2022  8:14 AM. If you have any questions, ask your nurse or doctor.          STOP taking these medications    UNABLE TO FIND Stopped by: Fransisca Kaufmann Dayvion Sans, MD       TAKE these medications    aspirin EC 81 MG tablet Take 81 mg by mouth daily.   CINNAMON PO Take 1,000 mg by mouth daily.   fish oil-omega-3 fatty acids 1000 MG capsule Take 1 g by mouth daily.   multivitamin tablet Take 1 tablet by mouth daily.   pravastatin 40 MG tablet Commonly known as: PRAVACHOL Take 1 tablet (40 mg total) by mouth daily.   Qtern 10-5 MG Tabs Generic drug: Dapagliflozin-sAXagliptin Take 1 tablet by mouth daily. Started by: Worthy Rancher, MD   Toujeo SoloStar 300 UNIT/ML Solostar Pen Generic drug: insulin glargine (1 Unit Dial) Inject 10 Units into the skin at bedtime.   UltiCare Mini Pen Needles 31G X 6 MM Misc Generic drug: Insulin Pen Needle USE AS DIRECTED AT BEDTIME         Objective:   BP 125/68   Pulse 74   Temp 98.3 F (36.8 C)   Ht _1  (1.854 m)   Wt 138 lb (62.6  kg)   SpO2 100%   BMI 18.21 kg/m   Wt Readings from Last 3 Encounters:  02/28/22 138 lb (62.6 kg)  11/24/21 141 lb (64 kg)  08/31/21 139 lb (63 kg)    Physical Exam Vitals and nursing note reviewed.  Constitutional:      General: He is not in acute distress.    Appearance: He is well-developed. He is not diaphoretic.  Eyes:     General: No scleral icterus.    Conjunctiva/sclera: Conjunctivae normal.  Neck:     Thyroid: No thyromegaly.  Cardiovascular:     Rate and Rhythm: Normal rate and regular rhythm.     Heart sounds: Normal heart sounds. No murmur heard. Pulmonary:     Effort: Pulmonary effort is normal. No respiratory distress.     Breath sounds:  Normal breath sounds. No wheezing.  Musculoskeletal:        General: Normal range of motion.     Cervical back: Neck supple.  Lymphadenopathy:     Cervical: No cervical adenopathy.  Skin:    General: Skin is warm and dry.     Findings: No rash.  Neurological:     Mental Status: He is alert and oriented to person, place, and time.     Coordination: Coordination normal.  Psychiatric:        Behavior: Behavior normal.       Assessment & Plan:   Problem List Items Addressed This Visit       Cardiovascular and Mediastinum   Essential hypertension   Relevant Orders   Microalbumin / creatinine urine ratio     Endocrine   Type 2 diabetes mellitus with other specified complication (HCC) - Primary   Relevant Medications   Dapagliflozin-sAXagliptin (QTERN) 10-5 MG TABS   Other Relevant Orders   Bayer DCA Hb A1c Waived   BMP8+EGFR   Microalbumin / creatinine urine ratio     Other   Mixed hyperlipidemia   Other Visit Diagnoses     Hyperkalemia       Need for immunization against influenza       Relevant Orders   Flu Vaccine QUAD High Dose(Fluad) (Completed)       A1c was 7.1.  Slight improvement, continue current medicine. Follow up plan: Return in about 3 months (around 05/31/2022), or if symptoms worsen or fail to improve, for Diabetes recheck.  Counseling provided for all of the vaccine components Orders Placed This Encounter  Procedures   Flu Vaccine QUAD High Dose(Fluad)   Bayer DCA Hb A1c Waived   BMP8+EGFR   Microalbumin / creatinine urine ratio    Caryl Pina, MD [facility] 02/28/2022, 8:14 AM

## 2022-03-01 ENCOUNTER — Telehealth: Payer: Self-pay | Admitting: Pharmacist

## 2022-03-01 NOTE — Telephone Encounter (Signed)
John Reese, Can you please mail paperwork for [facility] glyxambi 25/'5mg'$  daily (1 tab by mouth every morning) Send PCP portion here John Reese is cancelling their PAP to my knowledge (if not we can keep qtern, but I think I read where they are d/c'ing that one)   [employer], can you do telephone app with patient (wife John Reese usually answers and can help) For pharmacy clinic (patient assistance)  Thank you both!

## 2022-03-02 LAB — MICROALBUMIN / CREATININE URINE RATIO
Creatinine, Urine: 31.4 mg/dL
Microalb/Creat Ratio: <10 mg/g creat (ref 0–29)
Microalbumin, Urine: <3 ug/mL

## 2022-03-12 NOTE — Telephone Encounter (Signed)
Mailed app to pt's home.

## 2022-04-12 ENCOUNTER — Ambulatory Visit (INDEPENDENT_AMBULATORY_CARE_PROVIDER_SITE_OTHER): Payer: PPO | Admitting: Pharmacist

## 2022-04-12 DIAGNOSIS — E1169 Type 2 diabetes mellitus with other specified complication: Secondary | ICD-10-CM

## 2022-04-12 DIAGNOSIS — Z794 Long term (current) use of insulin: Secondary | ICD-10-CM

## 2022-04-12 NOTE — Progress Notes (Signed)
   Pharmacy Note   04/12/2022 Name:  John Reese         MRN:  [id number]      DOB:  01-24-1951   Summary: T2DM   Recommendations/Changes made from today's visit: Diabetes: Controlled; Current treatment: QTERN 10/'5mg'$  (SGLT2/DPP4 COMBO-->1 TABLET EVERY AM), TOUJEO 10 units at bedtime;  A1c 6.7%-->7.1%-GFR 69 Past meds t2dm: Jardiance stopped due to reported weight loss  Tradjenta did not lower BGs enough CONTINUE QTERN 10/'5mg'$  -- 1 tablet by mouth every morning until supply runs out (has #60 tabs) [place] discontinuing QTERN Application submitted to [facility] for glyxambi substitution (same class of med as QTERN) CONTINUE Toujeo Patient aware of changes Current glucose readings: fasting glucose: <130, post prandial glucose: 160-180   Denies hypoglycemic/hyperglycemic symptoms Discussed meal planning options and Plate method for healthy eating Avoid sugary drinks and desserts Incorporate balanced protein, non starchy veggies, 1 serving of carbohydrate with each meal Increase water intake Increase physical activity as able Current exercise: n/a Educated on new medication (QTERN) Assessed patient finances. application submitted to [facility] for Glyxambi   HYPERLIPIDEMIA -LDL 94 (goal <70 w/ T2DM) -no mention of previous intolerances with statins --> currently on Pravastatin '40mg'$  qHS -would like to transition patient to rosuvastatin-->will explore -labs reviewed -diet/lifestyle modifications encouraged   Hypertension -patient not on ACEi/ARB -was on ramipril in the past but unclear why d/c'd   Patient Goals/Self-Care Activities Over the next 90 days, patient will:  - take medications as prescribed   Follow Up Plan: Telephone follow up appointment with care management team member scheduled for: 3 MONTHS       Lipid Panel     Component Value Date/Time   CHOL 167 11/22/2021 0803   TRIG 52 11/22/2021 0803   HDL 50 11/22/2021 0803   CHOLHDL 3.3  11/22/2021 0803   CHOLHDL 3.4 09/23/2018 0749   VLDL 20 06/27/2015 0816   LDLCALC 106 (H) 11/22/2021 0803   LDLCALC 100 (H) 09/23/2018 0749   LABVLDL 11 11/22/2021 0803     Regina Eck, PharmD, BCPS, BCACP Clinical Pharmacist, [place]  [facility]  T [phone]

## 2022-04-12 NOTE — Progress Notes (Signed)
Assessment/Plan:    1.  Essential Tremor   -Status post DBS surgery.  IPG changed March 22, 2016, and November 01, 2020.  2.  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  -pt now off of lyrica and carbamazepine  -had V2/V3 rhizotomy with Dr. Vertell Limber 10/2019  -had repeat V2/V3 rhizotomy with Dr. Vertell Limber October, 2021.  Great results and remains pain-free. 3.  Leg pain  -Currently without low back pain.  Has had low back pain in the past, for which he used to follow with Dr. Vertell Limber before he retired.  -Leg pain does not at all sound radicular or neuropathic.  Told him to follow-up with primary care. 4.  Follow-up 9 months to 1 year  Subjective:   John Reese was seen today in follow up for essential tremor.  My previous records were reviewed prior to todays visit.  Patient with wife who supplements history.  Doing well with tremor and dbs.    He has a bit of R hand tremor in "spells."  Saw pcp in November.  Wife mentions leg pain.  States that she told the patient to mention it to her primary care, but they have not discussed at this point.  Patient states that it is from the groin all the way down into the feet.  It only occurs when he first stands.  It gets better as he walks.  He denies paresthesias or low back pain.  He has had low back pain in the past.  Current prescribed movement disorder medications: None (weaned off)  Prior meds: Carbamazepine; Lyrica (myoclonus with higher dosages)   ALLERGIES:   No Known Allergies   CURRENT MEDICATIONS:  Outpatient Encounter Medications as of 04/24/2022  Medication Sig   aspirin EC 81 MG tablet Take 81 mg by mouth daily.   CINNAMON PO Take 1,000 mg by mouth daily.   fish oil-omega-3 fatty acids 1000 MG capsule Take 1 g by mouth daily.   insulin glargine, 1 Unit Dial, (TOUJEO SOLOSTAR) 300 UNIT/ML Solostar Pen Inject 10 Units into the skin at bedtime.   Insulin Pen Needle  (ULTICARE MINI PEN NEEDLES) 31G X 6 MM MISC USE AS DIRECTED AT BEDTIME   Multiple Vitamin (MULTIVITAMIN) tablet Take 1 tablet by mouth daily.   pravastatin (PRAVACHOL) 40 MG tablet Take 1 tablet (40 mg total) by mouth daily.   Dapagliflozin-sAXagliptin (QTERN) 10-5 MG TABS Take 1 tablet by mouth daily. (Patient not taking: Reported on 04/24/2022)   No facility-administered encounter medications on file as of 04/24/2022.     Objective:    PHYSICAL EXAMINATION:    VITALS:   Vitals:   04/24/22 1021  BP: 132/80  Pulse: 72  SpO2: 98%  Weight: 145 lb 6.4 oz (66 kg)  Height: '6\' 1"'$  (1.854 m)   Wt Readings from Last 3 Encounters:  04/24/22 145 lb 6.4 oz (66 kg)  02/28/22 138 lb (62.6 kg)  11/24/21 141 lb (64 kg)       GEN:  The patient appears stated age and is in NAD. HEENT:  Normocephalic, atraumatic.  The mucous membranes are moist. The superficial temporal arteries are without ropiness or tenderness.   Neurological examination:  Orientation: The patient is alert and oriented x3. Cranial nerves: There is good facial symmetry. The speech is fluent and clear. Soft palate rises symmetrically and there is no tongue deviation. Hearing is intact to conversational tone. Sensation: Sensation is intact  to light touch throughout Motor: Strength is at least antigravity x4.  Movement examination: Tone: There is normal tone in the UE/LE Abnormal movements: none.  No rest tremor.  No postural tremor.  Slight tremor when given away in the right hand.  This is corrected post DBS adjustment. Coordination:  There is no decremation with RAM's Gait and Station: The patient has no difficulty arising out of a deep-seated chair without the use of the hands. The patient's stride length is good I have reviewed and interpreted the following labs independently   Chemistry      Component Value Date/Time   NA 139 02/28/2022 0820   K 4.6 02/28/2022 0820   CL 100 02/28/2022 0820   CO2 23 02/28/2022  0820   BUN 21 02/28/2022 0820   CREATININE 1.13 02/28/2022 0820   CREATININE 0.96 10/06/2019 0925      Component Value Date/Time   CALCIUM 9.8 02/28/2022 0820   ALKPHOS 54 11/22/2021 0803   AST 15 11/22/2021 0803   ALT 13 11/22/2021 0803   BILITOT 0.4 11/22/2021 0803      Lab Results  Component Value Date   WBC 4.5 11/22/2021   HGB 13.4 11/22/2021   HCT 40.1 11/22/2021   MCV 85 11/22/2021   PLT 160 11/22/2021   Lab Results  Component Value Date   TSH 1.39 10/06/2019     Chemistry      Component Value Date/Time   NA 139 02/28/2022 0820   K 4.6 02/28/2022 0820   CL 100 02/28/2022 0820   CO2 23 02/28/2022 0820   BUN 21 02/28/2022 0820   CREATININE 1.13 02/28/2022 0820   CREATININE 0.96 10/06/2019 0925      Component Value Date/Time   CALCIUM 9.8 02/28/2022 0820   ALKPHOS 54 11/22/2021 0803   AST 15 11/22/2021 0803   ALT 13 11/22/2021 0803   BILITOT 0.4 11/22/2021 0803     Total time spent on today's visit was 22 minutes, including both face-to-face time and nonface-to-face time.  Time included that spent on review of records (prior notes available to me/labs/imaging if pertinent), discussing treatment and goals, answering patient's questions and coordinating care.  This didn't include dbs time   Cc:  [name], Fransisca Kaufmann, MD

## 2022-04-24 ENCOUNTER — Ambulatory Visit: Payer: PPO | Admitting: Neurology

## 2022-04-24 ENCOUNTER — Encounter: Payer: Self-pay | Admitting: Neurology

## 2022-04-24 VITALS — BP 132/80 | HR 72 | Ht 73.0 in | Wt 145.4 lb

## 2022-04-24 DIAGNOSIS — M79605 Pain in left leg: Secondary | ICD-10-CM | POA: Diagnosis not present

## 2022-04-24 DIAGNOSIS — G250 Essential tremor: Secondary | ICD-10-CM

## 2022-04-24 DIAGNOSIS — M79604 Pain in right leg: Secondary | ICD-10-CM

## 2022-04-24 DIAGNOSIS — Z9689 Presence of other specified functional implants: Secondary | ICD-10-CM

## 2022-04-24 NOTE — Procedures (Signed)
DBS Programming was performed.    Manufacturer of DBS device: [employer]onic  Total time spent programming was 12 minutes.  Device was confirmed to be on.  Soft start was confirmed to be on.  Impedences were checked and were within normal limits.  Battery was checked and was determined to be functioning normally and not near end of life  Final settings were as follows:   Active Contact Amplitude (V) PW (ms) Frequency (hz) Side Effects Battery  Left Brain 1-2+ 2.8 90 150  2.85  02/02/20        10/04/20 1-2+ 2.8 90 150  2.75  04/12/21 1-2+ 2.8 90 150  2.97  04/24/22 1-2+ 3.0 90 150  2.94                  Right Brain        02/02/20 1-2+ 2.7 90 150  2.83  10/04/20 1-2+ 2.7 90 150  2.58  04/12/21 1-2+ 2.7 90 150  2.97  04/24/22 1-2+ 2.7 90 150  2.95

## 2022-05-22 ENCOUNTER — Ambulatory Visit: Payer: PPO | Admitting: Pharmacist

## 2022-05-22 ENCOUNTER — Encounter: Payer: PPO | Admitting: Neurology

## 2022-05-22 NOTE — Progress Notes (Signed)
   Pharmacy Note   05/22/2022 Name:  John Reese         MRN:  [id number]      DOB:  05-17-1950   Summary: T2DM   Recommendations/Changes made from today's visit: Diabetes: Controlled; Current treatment: Glyxambi 25/5 mg daily (SGLT2/DPP4 COMBO-->1 TABLET EVERY AM), TOUJEO 10 units at bedtime;  A1c 6.7%-->7.1%-GFR 69 Past meds t2dm: Jardiance stopped due to reported weight loss  Tradjenta did not lower BGs enough Qtern: [place] CONTINUE Glyxambi 25/5 mg -- 1 tablet by mouth every morning  Received first shipment from [facility] ~ 2 weeks ago; denies any side effects since starting this medication Patient appropriately discontinued Qtern when he started [employer] Current glucose readings: fasting glucose: <130, post prandial glucose: 149-199, will occasionally see 200s ~1x/week   Denies hypoglycemic/hyperglycemic symptoms Discussed meal planning options and Plate method for healthy eating Avoid sugary drinks and desserts Incorporate balanced protein, non starchy veggies, 1 serving of carbohydrate with each meal Increase water intake Increase physical activity as able Current exercise: n/a Educated on new medication (Glyxambi) Assessed patient finances. Patient received first shipment from [facility] and started taking Glyxambi ~2 weeks ago.    HYPERLIPIDEMIA -LDL 106 on 11/2021 (goal <70 w/ T2DM) -no mention of previous intolerances with statins --> currently on Pravastatin '40mg'$  qHS -would like to transition patient to rosuvastatin-->will explore -labs reviewed -diet/lifestyle modifications encouraged   Hypertension -patient not on ACEi/ARB -was on ramipril in the past but unclear why d/c'd   Patient Goals/Self-Care Activities Over the next 90 days, patient will:  - take medications as prescribed   Follow Up Plan: Telephone follow up appointment with care management team member scheduled for: 3 MONTHS       Lipid Panel      Component Value Date/Time   CHOL 167 11/22/2021 0803   TRIG 52 11/22/2021 0803   HDL 50 11/22/2021 0803   CHOLHDL 3.3 11/22/2021 0803   CHOLHDL 3.4 09/23/2018 0749   VLDL 20 06/27/2015 0816   LDLCALC 106 (H) 11/22/2021 0803   LDLCALC 100 (H) 09/23/2018 0749   LABVLDL 11 11/22/2021 0803     Regina Eck, PharmD, BCPS, BCACP Clinical Pharmacist, [place]  [facility]  T [phone]

## 2022-05-29 ENCOUNTER — Other Ambulatory Visit: Payer: Self-pay

## 2022-05-29 ENCOUNTER — Other Ambulatory Visit: Payer: PPO

## 2022-05-29 DIAGNOSIS — I10 Essential (primary) hypertension: Secondary | ICD-10-CM

## 2022-05-29 DIAGNOSIS — E1169 Type 2 diabetes mellitus with other specified complication: Secondary | ICD-10-CM

## 2022-05-29 DIAGNOSIS — Z125 Encounter for screening for malignant neoplasm of prostate: Secondary | ICD-10-CM

## 2022-05-29 DIAGNOSIS — E782 Mixed hyperlipidemia: Secondary | ICD-10-CM

## 2022-05-29 DIAGNOSIS — Z794 Long term (current) use of insulin: Secondary | ICD-10-CM | POA: Diagnosis not present

## 2022-05-29 LAB — BAYER DCA HB A1C WAIVED: HB A1C (BAYER DCA - WAIVED): 7.9 % — ABNORMAL HIGH (ref 4.8–5.6)

## 2022-05-30 LAB — CBC WITH DIFFERENTIAL/PLATELET
Basophils Absolute: 0 10*3/uL (ref 0.0–0.2)
Basos: 1 %
EOS (ABSOLUTE): 0.3 10*3/uL (ref 0.0–0.4)
Eos: 5 %
Hematocrit: 41.4 % (ref 37.5–51.0)
Hemoglobin: 13.7 g/dL (ref 13.0–17.7)
Immature Grans (Abs): 0 10*3/uL (ref 0.0–0.1)
Immature Granulocytes: 0 %
Lymphocytes Absolute: 1.4 10*3/uL (ref 0.7–3.1)
Lymphs: 24 %
MCH: 28.7 pg (ref 26.6–33.0)
MCHC: 33.1 g/dL (ref 31.5–35.7)
MCV: 87 fL (ref 79–97)
Monocytes Absolute: 0.4 10*3/uL (ref 0.1–0.9)
Monocytes: 7 %
Neutrophils Absolute: 3.7 10*3/uL (ref 1.4–7.0)
Neutrophils: 63 %
Platelets: 160 10*3/uL (ref 150–450)
RBC: 4.78 x10E6/uL (ref 4.14–5.80)
RDW: 12.7 % (ref 11.6–15.4)
WBC: 5.8 10*3/uL (ref 3.4–10.8)

## 2022-05-30 LAB — LIPID PANEL
Chol/HDL Ratio: 2.9 ratio (ref 0.0–5.0)
Cholesterol, Total: 138 mg/dL (ref 100–199)
HDL: 48 mg/dL (ref >39)
LDL Chol Calc (NIH): 76 mg/dL (ref 0–99)
Triglycerides: 72 mg/dL (ref 0–149)
VLDL Cholesterol Cal: 14 mg/dL (ref 5–40)

## 2022-05-30 LAB — CMP14+EGFR
ALT: 28 IU/L (ref 0–44)
AST: 17 IU/L (ref 0–40)
Albumin/Globulin Ratio: 1.9 (ref 1.2–2.2)
Albumin: 4.5 g/dL (ref 3.8–4.8)
Alkaline Phosphatase: 62 IU/L (ref 44–121)
BUN/Creatinine Ratio: 20 (ref 10–24)
BUN: 23 mg/dL (ref 8–27)
Bilirubin Total: 0.4 mg/dL (ref 0.0–1.2)
CO2: 23 mmol/L (ref 20–29)
Calcium: 10.2 mg/dL (ref 8.6–10.2)
Chloride: 100 mmol/L (ref 96–106)
Creatinine, Ser: 1.15 mg/dL (ref 0.76–1.27)
Globulin, Total: 2.4 g/dL (ref 1.5–4.5)
Glucose: 111 mg/dL — ABNORMAL HIGH (ref 70–99)
Potassium: 5.8 mmol/L — ABNORMAL HIGH (ref 3.5–5.2)
Sodium: 138 mmol/L (ref 134–144)
Total Protein: 6.9 g/dL (ref 6.0–8.5)
eGFR: 67 mL/min/{1.73_m2} (ref >59)

## 2022-06-01 ENCOUNTER — Encounter: Payer: Self-pay | Admitting: Family Medicine

## 2022-06-01 ENCOUNTER — Ambulatory Visit (INDEPENDENT_AMBULATORY_CARE_PROVIDER_SITE_OTHER): Payer: PPO | Admitting: Family Medicine

## 2022-06-01 VITALS — BP 129/72 | HR 79 | Ht 73.0 in | Wt 147.0 lb

## 2022-06-01 DIAGNOSIS — M7631 Iliotibial band syndrome, right leg: Secondary | ICD-10-CM | POA: Diagnosis not present

## 2022-06-01 DIAGNOSIS — I152 Hypertension secondary to endocrine disorders: Secondary | ICD-10-CM

## 2022-06-01 DIAGNOSIS — E1159 Type 2 diabetes mellitus with other circulatory complications: Secondary | ICD-10-CM

## 2022-06-01 DIAGNOSIS — E782 Mixed hyperlipidemia: Secondary | ICD-10-CM

## 2022-06-01 DIAGNOSIS — Z794 Long term (current) use of insulin: Secondary | ICD-10-CM

## 2022-06-01 DIAGNOSIS — I10 Essential (primary) hypertension: Secondary | ICD-10-CM

## 2022-06-01 DIAGNOSIS — E1169 Type 2 diabetes mellitus with other specified complication: Secondary | ICD-10-CM

## 2022-06-01 MED ORDER — TOUJEO SOLOSTAR 300 UNIT/ML ~~LOC~~ SOPN: 10.0000 [IU] | PEN_INJECTOR | Freq: Every day | SUBCUTANEOUS | 3 refills | Status: DC

## 2022-06-01 MED ORDER — PRAVASTATIN SODIUM 40 MG PO TABS: 40.0000 mg | ORAL_TABLET | Freq: Every day | ORAL | 3 refills | Status: DC

## 2022-06-01 NOTE — Progress Notes (Signed)
BP 129/72   Pulse 79   Ht 6' 1"$  (1.854 m)   Wt 147 lb (66.7 kg)   SpO2 97%   BMI 19.39 kg/m    Subjective:   Patient ID: John Reese, male    DOB: 10/25/1950, 72 y.o.   MRN: [id number]  HPI: John Reese is a 72 y.o. male presenting on 06/01/2022 for Medical Management of Chronic Issues, Diabetes, Hyperlipidemia, and Hypertension   HPI Type 2 diabetes mellitus Patient comes in today for recheck of his diabetes. Patient has been currently taking Glyxambi. Patient is not currently on an ACE inhibitor/ARB. Patient has not seen an ophthalmologist this year. Patient denies any issues with their feet. The symptom started onset as an adult hypertension and hyperlipidemia ARE RELATED TO DM   Hypertension Patient is currently on none currently, and their blood pressure today is 129/72. Patient denies any lightheadedness or dizziness. Patient denies headaches, blurred vision, chest pains, shortness of breath, or weakness. Denies any side effects from medication and is content with current medication.   Hyperlipidemia Patient is coming in for recheck of his hyperlipidemia. The patient is currently taking pravastatin. They deny any issues with myalgias or history of liver damage from it. They deny any focal numbness or weakness or chest pain.   Patient is complaining of pain along the lateral aspect of his hip down to his knee on both sides but worse on the right than left.  He says it does hurt when he lays on that side.  He says it hurts when he walks sometimes and when he moves sometimes.  He says is been bothering him for a few months.  Relevant past medical, surgical, family and social history reviewed and updated as indicated. Interim medical history since our last visit reviewed. Allergies and medications reviewed and updated.  Review of Systems  Constitutional:  Negative for chills and fever.  Eyes:  Negative for visual disturbance.  Respiratory:  Negative for shortness of breath  and wheezing.   Cardiovascular:  Negative for chest pain and leg swelling.  Musculoskeletal:  Negative for back pain and gait problem.  Skin:  Negative for rash.  Neurological:  Negative for dizziness, weakness and light-headedness.  All other systems reviewed and are negative.   Per HPI unless specifically indicated above   Allergies as of 06/01/2022   No Known Allergies      Medication List        Accurate as of June 01, 2022  8:58 AM. If you have any questions, ask your nurse or doctor.          aspirin EC 81 MG tablet Take 81 mg by mouth daily.   CINNAMON PO Take 1,000 mg by mouth daily.   fish oil-omega-3 fatty acids 1000 MG capsule Take 1 g by mouth daily.   Glyxambi 25-5 MG Tabs Generic drug: Empagliflozin-linaGLIPtin Take 1 tablet by mouth in the morning.   multivitamin tablet Take 1 tablet by mouth daily.   pravastatin 40 MG tablet Commonly known as: PRAVACHOL Take 1 tablet (40 mg total) by mouth daily.   Toujeo SoloStar 300 UNIT/ML Solostar Pen Generic drug: insulin glargine (1 Unit Dial) Inject 10 Units into the skin at bedtime.   UltiCare Mini Pen Needles 31G X 6 MM Misc Generic drug: Insulin Pen Needle USE AS DIRECTED AT BEDTIME         Objective:   BP 129/72   Pulse 79   Ht 6' 1"$  (1.854 m)  Wt 147 lb (66.7 kg)   SpO2 97%   BMI 19.39 kg/m   Wt Readings from Last 3 Encounters:  06/01/22 147 lb (66.7 kg)  04/24/22 145 lb 6.4 oz (66 kg)  02/28/22 138 lb (62.6 kg)    Physical Exam Vitals and nursing note reviewed.  Constitutional:      General: He is not in acute distress.    Appearance: He is well-developed. He is not diaphoretic.  Eyes:     General: No scleral icterus.    Conjunctiva/sclera: Conjunctivae normal.  Neck:     Thyroid: No thyromegaly.  Cardiovascular:     Rate and Rhythm: Normal rate and regular rhythm.     Heart sounds: Normal heart sounds. No murmur heard. Pulmonary:     Effort: Pulmonary effort is  normal. No respiratory distress.     Breath sounds: Normal breath sounds. No wheezing.  Musculoskeletal:        General: No swelling. Normal range of motion.     Cervical back: Neck supple.  Lymphadenopathy:     Cervical: No cervical adenopathy.  Skin:    General: Skin is warm and dry.     Findings: No rash.  Neurological:     Mental Status: He is alert and oriented to person, place, and time.     Coordination: Coordination normal.  Psychiatric:        Behavior: Behavior normal.     Results for orders placed or performed in visit on 05/29/22  CMP14+EGFR  Result Value Ref Range   Glucose 111 (H) 70 - 99 mg/dL   BUN 23 8 - 27 mg/dL   Creatinine, Ser 1.15 0.76 - 1.27 mg/dL   eGFR 67 >59 mL/min/1.73   BUN/Creatinine Ratio 20 10 - 24   Sodium 138 134 - 144 mmol/L   Potassium 5.8 (H) 3.5 - 5.2 mmol/L   Chloride 100 96 - 106 mmol/L   CO2 23 20 - 29 mmol/L   Calcium 10.2 8.6 - 10.2 mg/dL   Total Protein 6.9 6.0 - 8.5 g/dL   Albumin 4.5 3.8 - 4.8 g/dL   Globulin, Total 2.4 1.5 - 4.5 g/dL   Albumin/Globulin Ratio 1.9 1.2 - 2.2   Bilirubin Total 0.4 0.0 - 1.2 mg/dL   Alkaline Phosphatase 62 44 - 121 IU/L   AST 17 0 - 40 IU/L   ALT 28 0 - 44 IU/L  CBC with Differential/Platelet  Result Value Ref Range   WBC 5.8 3.4 - 10.8 x10E3/uL   RBC 4.78 4.14 - 5.80 x10E6/uL   Hemoglobin 13.7 13.0 - 17.7 g/dL   Hematocrit 41.4 37.5 - 51.0 %   MCV 87 79 - 97 fL   MCH 28.7 26.6 - 33.0 pg   MCHC 33.1 31.5 - 35.7 g/dL   RDW 12.7 11.6 - 15.4 %   Platelets 160 150 - 450 x10E3/uL   Neutrophils 63 Not Estab. %   Lymphs 24 Not Estab. %   Monocytes 7 Not Estab. %   Eos 5 Not Estab. %   Basos 1 Not Estab. %   Neutrophils Absolute 3.7 1.4 - 7.0 x10E3/uL   Lymphocytes Absolute 1.4 0.7 - 3.1 x10E3/uL   Monocytes Absolute 0.4 0.1 - 0.9 x10E3/uL   EOS (ABSOLUTE) 0.3 0.0 - 0.4 x10E3/uL   Basophils Absolute 0.0 0.0 - 0.2 x10E3/uL   Immature Granulocytes 0 Not Estab. %   Immature Grans (Abs) 0.0 0.0  - 0.1 x10E3/uL  Bayer DCA Hb A1c Waived  Result  Value Ref Range   HB A1C (BAYER DCA - WAIVED) 7.9 (H) 4.8 - 5.6 %  Lipid panel  Result Value Ref Range   Cholesterol, Total 138 100 - 199 mg/dL   Triglycerides 72 0 - 149 mg/dL   HDL 48 >39 mg/dL   VLDL Cholesterol Cal 14 5 - 40 mg/dL   LDL Chol Calc (NIH) 76 0 - 99 mg/dL   Chol/HDL Ratio 2.9 0.0 - 5.0 ratio    Assessment & Plan:   Problem List Items Addressed This Visit       Cardiovascular and Mediastinum   Essential hypertension - Primary   Relevant Medications   pravastatin (PRAVACHOL) 40 MG tablet     Endocrine   Type 2 diabetes mellitus with other specified complication (HCC)   Relevant Medications   pravastatin (PRAVACHOL) 40 MG tablet   insulin glargine, 1 Unit Dial, (TOUJEO SOLOSTAR) 300 UNIT/ML Solostar Pen     Other   Mixed hyperlipidemia   Relevant Medications   pravastatin (PRAVACHOL) 40 MG tablet   Other Visit Diagnoses     Iliotibial band syndrome of right side           Continue current medicine, focus on diet.  Discussed the possibility of Ozempic but he is worried about cost.  If not improved next time then we may have to adjust something different.  Recommended stretching and ice and massage for his IT band syndrome back Follow up plan: Return in about 3 months (around 08/30/2022), or if symptoms worsen or fail to improve, for diabetes and htn and hld.  Counseling provided for all of the vaccine components No orders of the defined types were placed in this encounter.   Caryl Pina, MD [facility] 06/01/2022, 8:58 AM

## 2022-06-05 ENCOUNTER — Telehealth: Payer: Self-pay | Admitting: Family Medicine

## 2022-06-06 NOTE — Telephone Encounter (Signed)
I do understand her concerns and we will discuss it at the next visit.  I would say if he is going to low then back off on the dose of the insulin and only take half of it every day.

## 2022-06-06 NOTE — Telephone Encounter (Signed)
His diabetes was not under control at 7.9.  I understand if she is concerned about his weight loss because Ozempic can decrease her appetite and that can be a side effect but I do not think we can go without something because his diabetes is uncontrolled.  We would have to replace it with something and if they want to discuss options to replace it with and have them please make an appointment.  Ozempic is a great medicine and that lower doses typically does not cause a significant amount of weight loss

## 2022-06-06 NOTE — Telephone Encounter (Signed)
Wife aware during phone conversation.

## 2022-06-06 NOTE — Telephone Encounter (Signed)
Wife does not want to schedule anything. Will wait and have pt discuss at 17mfollow up.  Wife states that pts blood sugar can be high at night but pt does not feel bad. When waking in the morning then his blood sugar is low.  Sometimes blood sugar is too low and pt does not need all of his insulin or will have to eat a cookie or two to bring blood sugar back up.  Wife is very concerned about any weight loss with Ozempic. She states that pt is already very thin and cannot lose any weight even if it is just 5lbs.

## 2022-08-01 ENCOUNTER — Telehealth: Payer: Self-pay

## 2022-08-01 NOTE — Telephone Encounter (Signed)
Returned patients wifes call from 07/27/22  Pts wife was able to reach out to company to get refills for husbands medication. Was confused who to reach out to regarding any patient assistance info, let her know to reach out to me directly and to keep the number she was given.

## 2022-09-03 ENCOUNTER — Ambulatory Visit (INDEPENDENT_AMBULATORY_CARE_PROVIDER_SITE_OTHER): Payer: PPO

## 2022-09-03 VITALS — Ht 73.0 in | Wt 148.0 lb

## 2022-09-03 DIAGNOSIS — Z0000 Encounter for general adult medical examination without abnormal findings: Secondary | ICD-10-CM

## 2022-09-03 NOTE — Progress Notes (Signed)
Subjective:   John Reese is a 72 y.o. male who presents for Medicare Annual/Subsequent preventive examination. I connected with  John Reese on 09/03/22 by a audio enabled telemedicine application and verified that I am speaking with the correct person using two identifiers.  Patient Location: Home  Provider Location: Home Office  I discussed the limitations of evaluation and management by telemedicine. The patient expressed understanding and agreed to proceed.  Review of Systems     Cardiac Risk Factors include: advanced age (>93men, >34 women);diabetes mellitus;male gender;dyslipidemia;hypertension     Objective:    Today's Vitals   09/03/22 0848  Weight: 148 lb (67.1 kg)  Height: 6\' 1"  (1.854 m)   Body mass index is 19.53 kg/m.     09/03/2022    8:51 AM 04/24/2022   10:24 AM 08/31/2021   10:02 AM 04/12/2021    9:31 AM 11/01/2020    1:09 PM 10/04/2020   11:05 AM 08/09/2020    8:23 AM  Advanced Directives  Does Patient Have a Medical Advance Directive? No Yes No Yes No No Yes  Type of Advance Directive  Living will  Living will   Healthcare Power of Attorney  Does patient want to make changes to medical advance directive?       No - Patient declined  Would patient like information on creating a medical advance directive? No - Patient declined  No - Patient declined  No - Patient declined      Current Medications (verified) Outpatient Encounter Medications as of 09/03/2022  Medication Sig   aspirin EC 81 MG tablet Take 81 mg by mouth daily.   CINNAMON PO Take 1,000 mg by mouth daily.   Empagliflozin-linaGLIPtin (GLYXAMBI) 25-5 MG TABS Take 1 tablet by mouth in the morning.   fish oil-omega-3 fatty acids 1000 MG capsule Take 1 g by mouth daily.   insulin glargine, 1 Unit Dial, (TOUJEO SOLOSTAR) 300 UNIT/ML Solostar Pen Inject 10 Units into the skin at bedtime.   Insulin Pen Needle (ULTICARE MINI PEN NEEDLES) 31G X 6 MM MISC USE AS DIRECTED AT BEDTIME   Multiple  Vitamin (MULTIVITAMIN) tablet Take 1 tablet by mouth daily.   pravastatin (PRAVACHOL) 40 MG tablet Take 1 tablet (40 mg total) by mouth daily.   No facility-administered encounter medications on file as of 09/03/2022.    Allergies (verified) Patient has no known allergies.   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    Tip   Diabetes (HCC)    on meds   Dyslipidemia    Hyperlipidemia    on meds   Tremor    has a deep brain stimulator in place to treat -   Trigeminal neuralgia    Right, V2 distribution   Past Surgical History:  Procedure Laterality Date   COLONOSCOPY  2021   FINGER AMPUTATION Right 1968   RIGHT pointer finger end   INNER EAR SURGERY Right    Ear drum repair   POLYPECTOMY  2021   TA's   PULSE GENERATOR IMPLANT Bilateral 03/30/2016   Procedure: BILATERAL PLACEMENT OF IMPLANTABLE PULSE GENERATOR;  Surgeon: Maeola Harman, MD;  Location: [facility] OR;  Service: Neurosurgery;  Laterality: Bilateral;  BILATERAL PLACEMENT OF IMPLANTABLE PULSE GENERATOR   SUBTHALAMIC STIMULATOR BATTERY REPLACEMENT N/A 11/01/2020   Procedure: Implantable pulse generator battery change, Right and left chest;  Surgeon: Maeola Harman,  MD;  Location: [facility] OR;  Service: Neurosurgery;  Laterality: N/A;  3C/RM 21   SUBTHALAMIC STIMULATOR INSERTION Bilateral 03/22/2016   Procedure: BILATERAL DEEP BRAIN STIMULATOR PLACEMENT WITH STARFIX WITH DR. Arbutus Leas;  Surgeon: Maeola Harman, MD;  Location: [facility] OR;  Service: Neurosurgery;  Laterality: Bilateral;   Family History  Problem Relation Age of Onset   Colon cancer Mother 46   Stroke Mother    Colon polyps Mother 58   Stroke Father    Peripheral vascular disease Sister    Healthy Sister    Heart attack Brother    Healthy Brother    Healthy Brother    Healthy Brother    Healthy Brother    Healthy Daughter    Healthy Son     Esophageal cancer Neg Hx    Stomach cancer Neg Hx    Rectal cancer Neg Hx    Social History   Socioeconomic History   Marital status: Married    Spouse name: Not on file   Number of children: 2   Years of education: HS   Highest education level: 12th grade  Occupational History   Occupation: Works in Cytogeneticist: OTHER    Comment: RETIRED  Tobacco Use   Smoking status: Never   Smokeless tobacco: Never  Vaping Use   Vaping Use: Never used  Substance and Sexual Activity   Alcohol use: No   Drug use: No   Sexual activity: Not on file  Other Topics Concern   Not on file  Social History Narrative   Patient is right handed.   Patient drinks 2 cups coffee daily.   Social Determinants of Health   Financial Resource Strain: Low Risk  (09/03/2022)   Overall Financial Resource Strain (CARDIA)    Difficulty of Paying Living Expenses: Not hard at all  Food Insecurity: Patient Declined (09/03/2022)   Hunger Vital Sign    Worried About Running Out of Food in the Last Year: Patient declined    Ran Out of Food in the Last Year: Patient declined  Transportation Needs: No Transportation Needs (09/03/2022)   PRAPARE - Administrator, Civil Service (Medical): No    Lack of Transportation (Non-Medical): No  Physical Activity: Sufficiently Active (09/03/2022)   Exercise Vital Sign    Days of Exercise per Week: 5 days    Minutes of Exercise per Session: 30 min  Stress: No Stress Concern Present (09/03/2022)   [employer] of Occupational Health - Occupational Stress Questionnaire    Feeling of Stress : Not at all  Social Connections: Socially Integrated (09/03/2022)   Social Connection and Isolation Panel [NHANES]    Frequency of Communication with Friends and Family: More than three times a week    Frequency of Social Gatherings with Friends and Family: More than three times a week    Attends Religious Services: More than 4 times per year    Active Member of  [employer] or Organizations: Yes    Attends Engineer, structural: More than 4 times per year    Marital Status: Married    Tobacco Counseling Counseling given: Not Answered   Clinical Intake:  Pre-visit preparation completed: Yes  Pain : No/denies pain     Nutritional Risks: None Diabetes: Yes CBG done?: No Did pt. bring in CBG monitor from home?: No  How often do you need to have someone help you when you read instructions, pamphlets, or other written materials from your doctor or pharmacy?:  1 - Never  Diabetic?yes Nutrition Risk Assessment:  Has the patient had any N/V/D within the last 2 months?  No  Does the patient have any non-healing wounds?  No  Has the patient had any unintentional weight loss or weight gain?  No   Diabetes:  Is the patient diabetic?  Yes  If diabetic, was a CBG obtained today?  No  Did the patient bring in their glucometer from home?  No  How often do you monitor your CBG's? 2 x day .   Financial Strains and Diabetes Management:  Are you having any financial strains with the device, your supplies or your medication? No .  Does the patient want to be seen by Chronic Care Management for management of their diabetes?  No  Would the patient like to be referred to a Nutritionist or for Diabetic Management?  No   Diabetic Exams:  Diabetic Eye Exam: Completed 05/2022 Diabetic Foot Exam: Overdue, Pt has been advised about the importance in completing this exam. Pt is scheduled for diabetic foot exam on next office visit .   Interpreter Needed?: No  Information entered by :: Renie Ora, LPN   Activities of Daily Living    09/03/2022    8:51 AM  In your present state of health, do you have any difficulty performing the following activities:  Hearing? 0  Vision? 0  Difficulty concentrating or making decisions? 0  Walking or climbing stairs? 0  Dressing or bathing? 0  Doing errands, shopping? 0  Preparing Food and eating ? N  Using  the Toilet? N  In the past six months, have you accidently leaked urine? N  Do you have problems with loss of bowel control? N  Managing your Medications? N  Managing your Finances? N  Housekeeping or managing your Housekeeping? N    Patient Care Team: [name], Elige Radon, MD as PCP - General (Family Medicine) Jake Bathe, MD as PCP - Cardiology (Cardiology) [name], Octaviano Batty, DO as Consulting Physician (Neurology) Danella Maiers, [facility] as Pharmacist (Family Medicine) Napoleon Form, MD as Consulting Physician (Gastroenterology) Maeola Harman, MD as Consulting Physician (Neurosurgery)  Indicate any recent Medical Services you may have received from other than [facility] providers in the past year (date may be approximate).     Assessment:   This is a routine wellness examination for [employer].  Hearing/Vision screen Vision Screening - Comments:: Wears rx glasses - up to date with routine eye exams with  Dr.Johnson   Dietary issues and exercise activities discussed: Current Exercise Habits: Home exercise routine, Type of exercise: walking, Time (Minutes): 30, Frequency (Times/Week): 5, Weekly Exercise (Minutes/Week): 150, Intensity: Mild, Exercise limited by: None identified   Goals Addressed             This Visit's Progress    DIET - INCREASE WATER INTAKE         Depression Screen    09/03/2022    8:50 AM 06/01/2022    8:24 AM 02/28/2022    7:59 AM 11/24/2021    8:21 AM 08/31/2021   10:01 AM 08/11/2021    8:27 AM 06/19/2021    3:00 PM  PHQ 2/9 Scores  PHQ - 2 Score 0 0 0 0 0 0 0  PHQ- 9 Score  0 0 0   0    Fall Risk    09/03/2022    8:49 AM 06/01/2022    8:24 AM 04/24/2022   10:24 AM 02/28/2022  7:59 AM 11/24/2021    8:21 AM  Fall Risk   Falls in the past year? 0 0 0 0 0  Number falls in past yr: 0  0    Injury with Fall? 0  0    Risk for fall due to : No Fall Risks      Follow up Falls prevention discussed  Falls evaluation completed      FALL RISK PREVENTION  PERTAINING TO THE HOME:  Any stairs in or around the home? No  If so, are there any without handrails? No  Home free of loose throw rugs in walkways, pet beds, electrical cords, etc? Yes  Adequate lighting in your home to reduce risk of falls? Yes   ASSISTIVE DEVICES UTILIZED TO PREVENT FALLS:  Life alert? No  Use of a cane, walker or w/c? No  Grab bars in the bathroom? Yes  Shower chair or bench in shower? Yes  Elevated toilet seat or a handicapped toilet? Yes          09/03/2022    8:51 AM 08/31/2021   10:06 AM 08/09/2020    8:16 AM  6CIT Screen  What Year? 0 points 0 points 0 points  What month? 0 points 0 points 0 points  What time? 0 points 0 points 0 points  Count back from 20 0 points 0 points 0 points  Months in reverse 0 points 0 points 0 points  Repeat phrase 0 points 0 points 2 points  Total Score 0 points 0 points 2 points    Immunizations Immunization History  Administered Date(s) Administered   Fluad Quad(high Dose 65+) 02/01/2016, 01/02/2017, 12/30/2018, 02/05/2020, 02/06/2021, 02/28/2022   Influenza, High Dose Seasonal PF 02/07/2015, 02/01/2016, 01/02/2017, 01/24/2018, 01/24/2018   Influenza-Unspecified 02/02/2014   PFIZER(Purple Top)SARS-COV-2 Vaccination 05/10/2019, 05/28/2019, 01/07/2020   Pneumococcal Conjugate-13 04/07/2015   Pneumococcal Polysaccharide-23 07/02/2016, 12/30/2018   Tdap 08/23/2017   Zoster Recombinat (Shingrix) 07/08/2020, 09/16/2020   Zoster, Live 12/02/2012    TDAP status: Up to date  Flu Vaccine status: Up to date  Pneumococcal vaccine status: Up to date  Covid-19 vaccine status: Completed vaccines  Qualifies for Shingles Vaccine? Yes   Zostavax completed Yes   Shingrix Completed?: Yes  Screening Tests Health Maintenance  Topic Date Due   Hepatitis C Screening  Never done   COVID-19 Vaccine (4 - 2023-24 season) 09/19/2022 (Originally 12/22/2021)   INFLUENZA VACCINE  11/22/2022   FOOT EXAM  11/25/2022   HEMOGLOBIN A1C   11/27/2022   Diabetic kidney evaluation - Urine ACR  03/01/2023   OPHTHALMOLOGY EXAM  05/26/2023   Diabetic kidney evaluation - eGFR measurement  05/30/2023   Medicare Annual Wellness (AWV)  09/03/2023   COLONOSCOPY (Pts 45-43yrs Insurance coverage will need to be confirmed)  01/24/2024   DTaP/Tdap/Td (2 - Td or Tdap) 08/24/2027   Pneumonia Vaccine 64+ Years old  Completed   Zoster Vaccines- Shingrix  Completed   HPV VACCINES  Aged Out    Health Maintenance  Health Maintenance Due  Topic Date Due   Hepatitis C Screening  Never done    Colorectal cancer screening: Type of screening: Colonoscopy. Completed 01/23/2021. Repeat every 3 years  Lung Cancer Screening: (Low Dose CT Chest recommended if Age 5-80 years, 30 pack-year currently smoking OR have quit w/in 15years.) does not qualify.   Lung Cancer Screening Referral: n/a  Additional Screening:  Hepatitis C Screening: does qualify;   Vision Screening: Recommended annual ophthalmology exams for early detection of glaucoma  and other disorders of the eye. Is the patient up to date with their annual eye exam?  Yes  Who is the provider or what is the name of the office in which the patient attends annual eye exams? Dr.Johnson  If pt is not established with a provider, would they like to be referred to a provider to establish care? No .   Dental Screening: Recommended annual dental exams for proper oral hygiene  Community Resource Referral / Chronic Care Management: CRR required this visit?  No   CCM required this visit?  No      Plan:     I have personally reviewed and noted the following in the patient's chart:   Medical and social history Use of alcohol, tobacco or illicit drugs  Current medications and supplements including opioid prescriptions. Patient is not currently taking opioid prescriptions. Functional ability and status Nutritional status Physical activity Advanced directives List of other  physicians Hospitalizations, surgeries, and ER visits in previous 12 months Vitals Screenings to include cognitive, depression, and falls Referrals and appointments  In addition, I have reviewed and discussed with patient certain preventive protocols, quality metrics, and best practice recommendations. A written personalized care plan for preventive services as well as general preventive health recommendations were provided to patient.     Lorrene Reid, LPN   07/30/8117   Nurse Notes: none

## 2022-09-03 NOTE — Patient Instructions (Signed)
[name] , Thank you for taking time to come for your Medicare Wellness Visit. I appreciate your ongoing commitment to your health goals. Please review the following plan we discussed and let me know if I can assist you in the future.   These are the goals we discussed:  Goals       Client will verbalize knowledge of diabetes self-management as evidenced by Hgb A1C <7 or as defined by provider. (pt-stated)      Since starting Toujeo blood sugars are much better      DIET - INCREASE WATER INTAKE      Exercise 150 min/wk Moderate Activity      T2DM, HLD PHARMD GOAL (pt-stated)      Current Barriers:  Unable to independently afford treatment regimen Suboptimal therapeutic regimen for T2DM  Pharmacist Clinical Goal(s):  Over the next 90 days, patient will verbalize ability to afford treatment regimen maintain control of T2DM as evidenced by improved glycemic control; decreased side effects from medication switch  through collaboration with PharmD and provider.   Interventions: 1:1 collaboration with [name], Elige Radon, MD regarding development and update of comprehensive plan of care as evidenced by provider attestation and co-signature Inter-disciplinary care team collaboration (see longitudinal plan of care) Comprehensive medication review performed; medication list updated in electronic medical record  Diabetes: Controlled; Current treatment: QTERN 10/5mg  (SGLT2/DPP4 COMBO-->1 TABLET EVERY AM), TOUJEO 10 units at bedtime;  A1c 6.7%-->7.1%--slight increase, patient thinks it is due to 2 weeks of illness where his sugar ran highere Jardiance stopped due to reported weight loss  Tradjenta did not lower BGs enough CONTINUE QTERN 10/5mg  -- 1 tablet by mouth every morning [place] until 04/22/22 CONTINUE Toujeo Continue current regimen Current glucose readings: fasting glucose: 88-104, post prandial glucose: 160-180 Denies  hypoglycemic/hyperglycemic symptoms Discussed meal planning options and Plate method for healthy eating Avoid sugary drinks and desserts Incorporate balanced protein, non starchy veggies, 1 serving of carbohydrate with each meal Increase water intake Increase physical activity as able Current exercise: n/a Educated on new medication (QTERN) Assessed patient finances. Application [place] for [place] patient [place] for QTERN 10/5mg --escribed to patient assistance pharmacy--this will replace glyxambi (easier to get)--MEDICATION TO SHIP TO PATIENT'S HOME IN 3 MONTH INCREMENTS; APPROVED UNTIL 04/22/22  HYPERLIPIDEMIA -LDL 94 (goal <70 w/ T2DM) -no mention of previous intolerances with statins --> currently on Pravastatin 40mg  qHS -would like to transition patient to rosuvastatin-->will explore -labs reviewed -diet/lifestyle modifications encouraged  Hypertension -patient not on ACEi/ARB -was on ramipril in the past but unclear why d/c'd -BP controlled-will address at f/u  Patient Goals/Self-Care Activities Over the next 90 days, patient will:  - take medications as prescribed  Follow Up Plan: Telephone follow up appointment with care management team member scheduled for: 3 MONTHS         This is a list of the screening recommended for you and due dates:  Health Maintenance  Topic Date Due   Hepatitis C Screening: USPSTF Recommendation to screen - Ages 27-79 yo.  Never done   COVID-19 Vaccine (4 - 2023-24 season) 09/19/2022*   Flu Shot  11/22/2022   Complete foot exam   11/25/2022   Hemoglobin A1C  11/27/2022   Yearly kidney health urinalysis for diabetes  03/01/2023   Eye exam for diabetics  05/26/2023   Yearly kidney function blood test for diabetes  05/30/2023   Medicare Annual Wellness Visit  09/03/2023   Colon Cancer Screening  01/24/2024  DTaP/Tdap/Td vaccine (2 - Td or Tdap) 08/24/2027   Pneumonia Vaccine  Completed   Zoster (Shingles) Vaccine   Completed   HPV Vaccine  Aged Out  *Topic was postponed. The date shown is not the original due date.    Advanced directives: Advance directive discussed with you today. I have provided a copy for you to complete at home and have notarized. Once this is complete please bring a copy in to our office so we can scan it into your chart.   Conditions/risks identified: Aim for 30 minutes of exercise or brisk walking, 6-8 glasses of water, and 5 servings of fruits and vegetables each day.   Next appointment: Follow up in one year for your annual wellness visit.   Preventive Care 29 Years and Older, Male  Preventive care refers to lifestyle choices and visits with your health care provider that can promote health and wellness. What does preventive care include? A yearly physical exam. This is also called an annual well check. Dental exams once or twice a year. Routine eye exams. Ask your health care provider how often you should have your eyes checked. Personal lifestyle choices, including: Daily care of your teeth and gums. Regular physical activity. Eating a healthy diet. Avoiding tobacco and drug use. Limiting alcohol use. Practicing safe sex. Taking low doses of aspirin every day. Taking vitamin and mineral supplements as recommended by your health care provider. What happens during an annual well check? The services and screenings done by your health care provider during your annual well check will depend on your age, overall health, lifestyle risk factors, and family history of disease. Counseling  Your health care provider may ask you questions about your: Alcohol use. Tobacco use. Drug use. Emotional well-being. Home and relationship well-being. Sexual activity. Eating habits. History of falls. Memory and ability to understand (cognition). Work and work Astronomer. Screening  You may have the following tests or measurements: Height, weight, and BMI. Blood pressure. Lipid  and cholesterol levels. These may be checked every 5 years, or more frequently if you are over 70 years old. Skin check. Lung cancer screening. You may have this screening every year starting at age 54 if you have a 30-pack-year history of smoking and currently smoke or have quit within the past 15 years. Fecal occult blood test (FOBT) of the stool. You may have this test every year starting at age 45. Flexible sigmoidoscopy or colonoscopy. You may have a sigmoidoscopy every 5 years or a colonoscopy every 10 years starting at age 76. Prostate cancer screening. Recommendations will vary depending on your family history and other risks. Hepatitis C blood test. Hepatitis B blood test. Sexually transmitted disease (STD) testing. Diabetes screening. This is done by checking your blood sugar (glucose) after you have not eaten for a while (fasting). You may have this done every 1-3 years. Abdominal aortic aneurysm (AAA) screening. You may need this if you are a current or former smoker. Osteoporosis. You may be screened starting at age 65 if you are at high risk. Talk with your health care provider about your test results, treatment options, and if necessary, the need for more tests. Vaccines  Your health care provider may recommend certain vaccines, such as: Influenza vaccine. This is recommended every year. Tetanus, diphtheria, and acellular pertussis (Tdap, Td) vaccine. You may need a Td booster every 10 years. Zoster vaccine. You may need this after age 104. Pneumococcal 13-valent conjugate (PCV13) vaccine. One dose is recommended after age 62.  Pneumococcal polysaccharide (PPSV23) vaccine. One dose is recommended after age 50. Talk to your health care provider about which screenings and vaccines you need and how often you need them. This information is not intended to replace advice given to you by your health care provider. Make sure you discuss any questions you have with your health care  provider. Document Released: 05/06/2015 Document Revised: 12/28/2015 Document Reviewed: 02/08/2015 Elsevier Interactive Patient Education  2017 [employer].  Fall Prevention in the Home Falls can cause injuries. They can happen to people of all ages. There are many things you can do to make your home safe and to help prevent falls. What can I do on the outside of my home? Regularly fix the edges of walkways and driveways and fix any cracks. Remove anything that might make you trip as you walk through a door, such as a raised step or threshold. Trim any bushes or trees on the path to your home. Use bright outdoor lighting. Clear any walking paths of anything that might make someone trip, such as rocks or tools. Regularly check to see if handrails are loose or broken. Make sure that both sides of any steps have handrails. Any raised decks and porches should have guardrails on the edges. Have any leaves, snow, or ice cleared regularly. Use sand or salt on walking paths during winter. Clean up any spills in your garage right away. This includes oil or grease spills. What can I do in the bathroom? Use night lights. Install grab bars by the toilet and in the tub and shower. Do not use towel bars as grab bars. Use non-skid mats or decals in the tub or shower. If you need to sit down in the shower, use a plastic, non-slip stool. Keep the floor dry. Clean up any water that spills on the floor as soon as it happens. Remove soap buildup in the tub or shower regularly. Attach bath mats securely with double-sided non-slip rug tape. Do not have throw rugs and other things on the floor that can make you trip. What can I do in the bedroom? Use night lights. Make sure that you have a light by your bed that is easy to reach. Do not use any sheets or blankets that are too big for your bed. They should not hang down onto the floor. Have a firm chair that has side arms. You can use this for support while  you get dressed. Do not have throw rugs and other things on the floor that can make you trip. What can I do in the kitchen? Clean up any spills right away. Avoid walking on wet floors. Keep items that you use a lot in easy-to-reach places. If you need to reach something above you, use a strong step stool that has a grab bar. Keep electrical cords out of the way. Do not use floor polish or wax that makes floors slippery. If you must use wax, use non-skid floor wax. Do not have throw rugs and other things on the floor that can make you trip. What can I do with my stairs? Do not leave any items on the stairs. Make sure that there are handrails on both sides of the stairs and use them. Fix handrails that are broken or loose. Make sure that handrails are as long as the stairways. Check any carpeting to make sure that it is firmly attached to the stairs. Fix any carpet that is loose or worn. Avoid having throw rugs at the  top or bottom of the stairs. If you do have throw rugs, attach them to the floor with carpet tape. Make sure that you have a light switch at the top of the stairs and the bottom of the stairs. If you do not have them, ask someone to add them for you. What else can I do to help prevent falls? Wear shoes that: Do not have high heels. Have rubber bottoms. Are comfortable and fit you well. Are closed at the toe. Do not wear sandals. If you use a stepladder: Make sure that it is fully opened. Do not climb a closed stepladder. Make sure that both sides of the stepladder are locked into place. Ask someone to hold it for you, if possible. Clearly mark and make sure that you can see: Any grab bars or handrails. First and last steps. Where the edge of each step is. Use tools that help you move around (mobility aids) if they are needed. These include: Canes. Walkers. Scooters. Crutches. Turn on the lights when you go into a dark area. Replace any light bulbs as soon as they burn  out. Set up your furniture so you have a clear path. Avoid moving your furniture around. If any of your floors are uneven, fix them. If there are any pets around you, be aware of where they are. Review your medicines with your doctor. Some medicines can make you feel dizzy. This can increase your chance of falling. Ask your doctor what other things that you can do to help prevent falls. This information is not intended to replace advice given to you by your health care provider. Make sure you discuss any questions you have with your health care provider. Document Released: 02/03/2009 Document Revised: 09/15/2015 Document Reviewed: 05/14/2014 Elsevier Interactive Patient Education  2017 [employer].

## 2022-09-26 ENCOUNTER — Encounter: Payer: Self-pay | Admitting: Family Medicine

## 2022-09-26 ENCOUNTER — Ambulatory Visit (INDEPENDENT_AMBULATORY_CARE_PROVIDER_SITE_OTHER): Payer: PPO | Admitting: Family Medicine

## 2022-09-26 ENCOUNTER — Telehealth: Payer: Self-pay | Admitting: Family Medicine

## 2022-09-26 VITALS — BP 130/70 | HR 80 | Ht 73.0 in | Wt 146.0 lb

## 2022-09-26 DIAGNOSIS — Z794 Long term (current) use of insulin: Secondary | ICD-10-CM | POA: Diagnosis not present

## 2022-09-26 DIAGNOSIS — E782 Mixed hyperlipidemia: Secondary | ICD-10-CM | POA: Diagnosis not present

## 2022-09-26 DIAGNOSIS — I10 Essential (primary) hypertension: Secondary | ICD-10-CM

## 2022-09-26 DIAGNOSIS — E1169 Type 2 diabetes mellitus with other specified complication: Secondary | ICD-10-CM

## 2022-09-26 LAB — BAYER DCA HB A1C WAIVED: HB A1C (BAYER DCA - WAIVED): 7.9 % — ABNORMAL HIGH (ref 4.8–5.6)

## 2022-09-26 NOTE — Progress Notes (Signed)
BP 130/70   Pulse 80   Ht 6\' 1"  (1.854 m)   Wt 146 lb (66.2 kg)   SpO2 97%   BMI 19.26 kg/m    Subjective:   Patient ID: John Reese, male    DOB: 23-Jan-1951, 72 y.o.   MRN: [id number]  HPI: John Reese is a 72 y.o. male presenting on 09/26/2022 for Medical Management of Chronic Issues, Diabetes, Hypertension, and Hyperlipidemia   HPI Type 2 diabetes mellitus Patient comes in today for recheck of his diabetes. Patient has been currently taking Glyxambi and Toujeo, he does admit that he was out of Glyxambi for about a month. Patient is not currently on an ACE inhibitor/ARB. Patient has not seen an ophthalmologist this year. Patient denies any new issues with their feet. The symptom started onset as an adult hypertension and hyperlipidemia ARE RELATED TO DM.  He says his blood sugars have been running good in the mornings between 89 and 15 but they run Higher in the evenings between 200 and 225  Hypertension Patient is currently on no medicine, diet control, and their blood pressure today is 130/70. Patient denies any lightheadedness or dizziness. Patient denies headaches, blurred vision, chest pains, shortness of breath, or weakness. Denies any side effects from medication and is content with current medication.   Hyperlipidemia Patient is coming in for recheck of his hyperlipidemia. The patient is currently taking fish oils and pravastatin. They deny any issues with myalgias or history of liver damage from it. They deny any focal numbness or weakness or chest pain.   Relevant past medical, surgical, family and social history reviewed and updated as indicated. Interim medical history since our last visit reviewed. Allergies and medications reviewed and updated.  Review of Systems  Constitutional:  Negative for chills and fever.  Eyes:  Negative for visual disturbance.  Respiratory:  Negative for shortness of breath and wheezing.   Cardiovascular:  Negative for chest pain and  leg swelling.  Musculoskeletal:  Negative for back pain and gait problem.  Skin:  Negative for rash.  Neurological:  Negative for dizziness, weakness and numbness.  All other systems reviewed and are negative.   Per HPI unless specifically indicated above   Allergies as of 09/26/2022   No Known Allergies      Medication List        Accurate as of September 26, 2022  8:33 AM. If you have any questions, ask your nurse or doctor.          aspirin EC 81 MG tablet Take 81 mg by mouth daily.   CINNAMON PO Take 1,000 mg by mouth daily.   fish oil-omega-3 fatty acids 1000 MG capsule Take 1 g by mouth daily.   Glyxambi 25-5 MG Tabs Generic drug: Empagliflozin-linaGLIPtin Take 1 tablet by mouth in the morning.   multivitamin tablet Take 1 tablet by mouth daily.   pravastatin 40 MG tablet Commonly known as: PRAVACHOL Take 1 tablet (40 mg total) by mouth daily.   Toujeo SoloStar 300 UNIT/ML Solostar Pen Generic drug: insulin glargine (1 Unit Dial) Inject 10 Units into the skin at bedtime.   UltiCare Mini Pen Needles 31G X 6 MM Misc Generic drug: Insulin Pen Needle USE AS DIRECTED AT BEDTIME         Objective:   BP 130/70   Pulse 80   Ht 6\' 1"  (1.854 m)   Wt 146 lb (66.2 kg)   SpO2 97%   BMI 19.26 kg/m  Wt Readings from Last 3 Encounters:  09/26/22 146 lb (66.2 kg)  09/03/22 148 lb (67.1 kg)  06/01/22 147 lb (66.7 kg)    Physical Exam Vitals and nursing note reviewed.  Constitutional:      General: He is not in acute distress.    Appearance: He is well-developed. He is not diaphoretic.  Eyes:     General: No scleral icterus.    Conjunctiva/sclera: Conjunctivae normal.  Neck:     Thyroid: No thyromegaly.  Cardiovascular:     Rate and Rhythm: Normal rate and regular rhythm.     Heart sounds: Normal heart sounds. No murmur heard. Pulmonary:     Effort: Pulmonary effort is normal. No respiratory distress.     Breath sounds: Normal breath sounds. No  wheezing.  Musculoskeletal:        General: No swelling. Normal range of motion.     Cervical back: Neck supple.  Lymphadenopathy:     Cervical: No cervical adenopathy.  Skin:    General: Skin is warm and dry.     Findings: No rash.  Neurological:     Mental Status: He is alert and oriented to person, place, and time.     Coordination: Coordination normal.  Psychiatric:        Behavior: Behavior normal.       Assessment & Plan:   Problem List Items Addressed This Visit       Cardiovascular and Mediastinum   Essential hypertension     Endocrine   Type 2 diabetes mellitus with other specified complication (HCC) - Primary   Relevant Orders   Bayer DCA Hb A1c Waived     Other   Mixed hyperlipidemia    A1c is up at 7.9, it was up last time as well.  He had been out of his Glyxambi during the last few months, he does have it back down, focus on diet and we will see how it looks next time Follow up plan: Return in about 3 months (around 12/27/2022), or if symptoms worsen or fail to improve, for Diabetes hypertension and cholesterol recheck.  Counseling provided for all of the vaccine components Orders Placed This Encounter  Procedures   Bayer DCA Hb A1c Waived    Arville Care, MD [facility] 09/26/2022, 8:33 AM

## 2022-09-26 NOTE — Telephone Encounter (Signed)
Patient wants labs added so that he came come in on 9/5 to have them drawn before his appt on 9/11

## 2022-09-26 NOTE — Telephone Encounter (Signed)
Patient informed lab orders will be placed

## 2022-10-25 IMAGING — MR MR LUMBAR SPINE W/O CM
4 series · 29 of 48 positions shown · non-contrast
Comparison: Radiography 05/20/2020

CLINICAL DATA: Spondylolisthesis. Deep brain stimulators in place
which limit technical parameters of scanning. I think this study is
sufficiently diagnostic.

EXAM:
MRI LUMBAR SPINE WITHOUT CONTRAST
TECHNIQUE: Multiplanar, multisequence MR imaging of the lumbar spine was
performed. No intravenous contrast was administered.

[Series 3: T1 · sagittal · 4.0mm · 1.02mm/px · 6 of 12 slices shown]
[im 1/12]
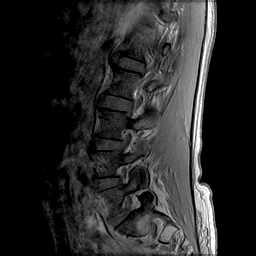
[im 3/12]
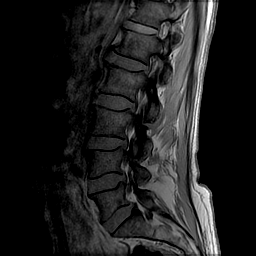
[im 5/12]
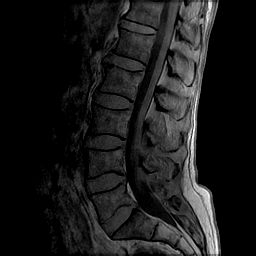
[im 7/12]
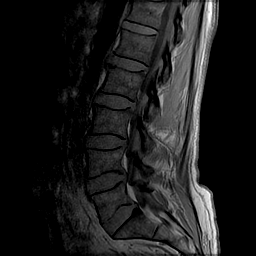
[im 9/12]
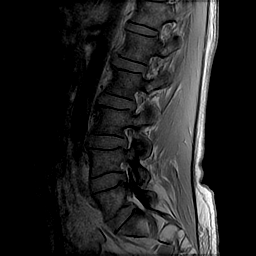
[im 12/12]
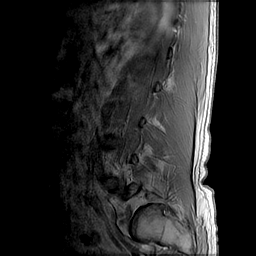

[Series 4: T2 · sagittal · 4.0mm · 1.02mm/px · 9 of 28 slices shown]
[im 1/28]
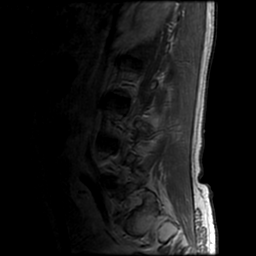
[im 5/28]
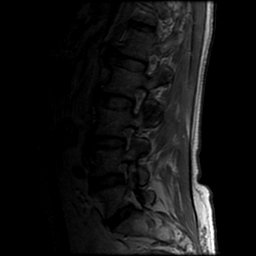
[im 10/28]
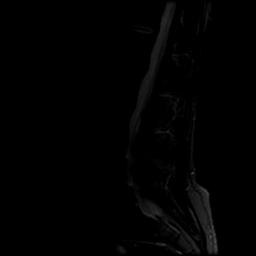
[im 12/28]
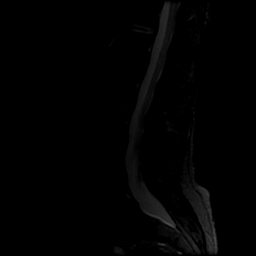
[im 14/28]
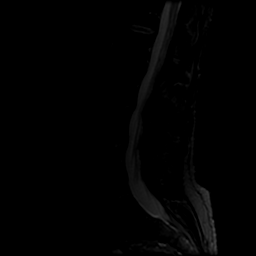
[im 16/28]
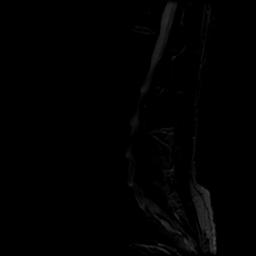
[im 19/28]
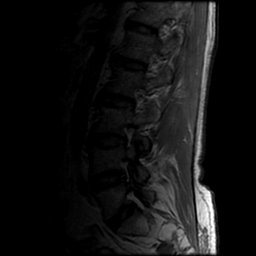
[im 23/28]
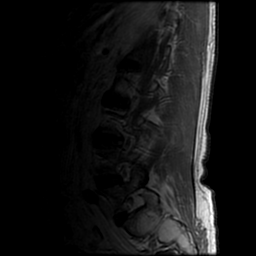
[im 28/28]
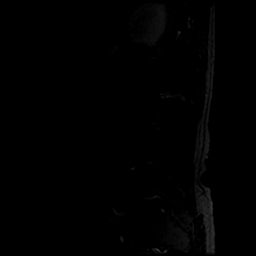

[Series 5: sag ir · sagittal · 4.0mm · 0.51mm/px · 6 of 12 slices shown]
[im 1/12]
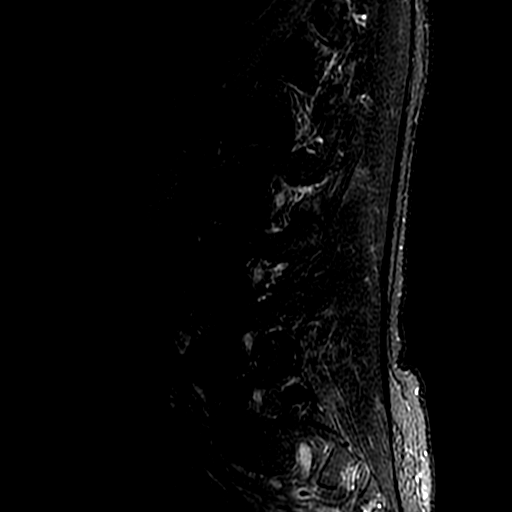
[im 3/12]
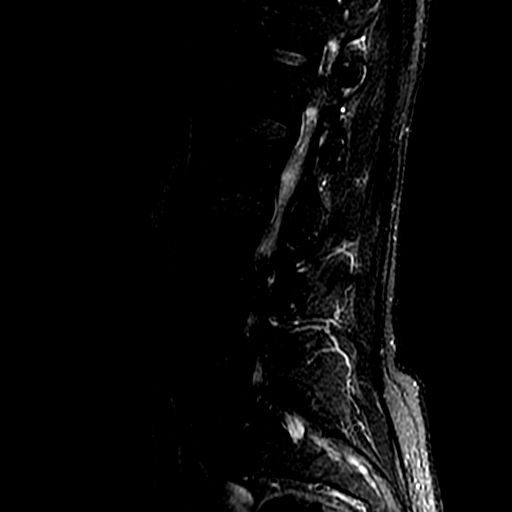
[im 5/12]
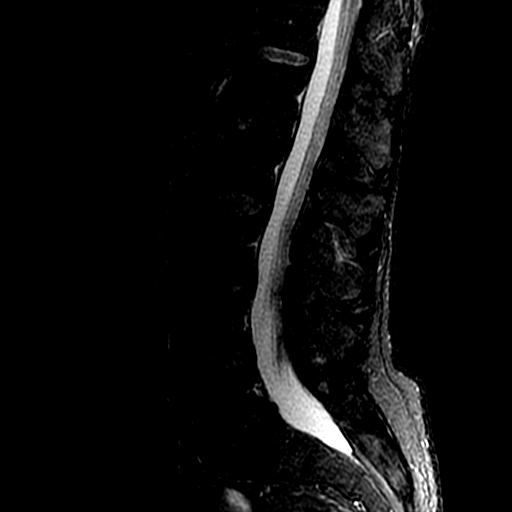
[im 7/12]
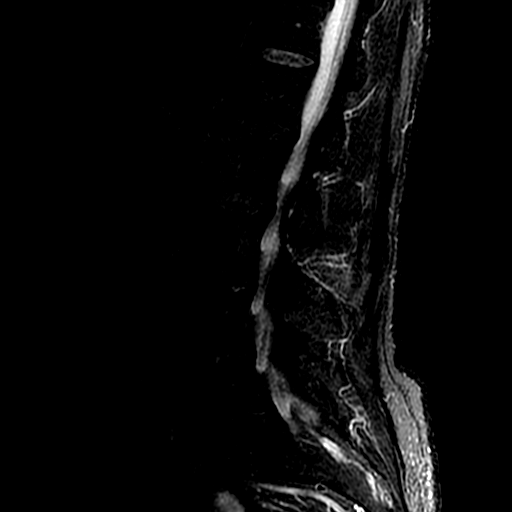
[im 9/12]
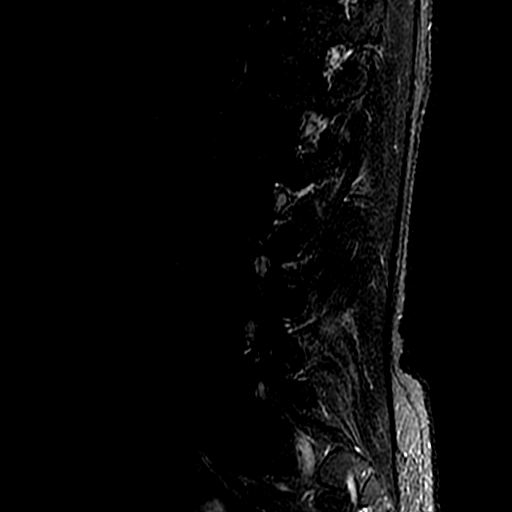
[im 12/12]
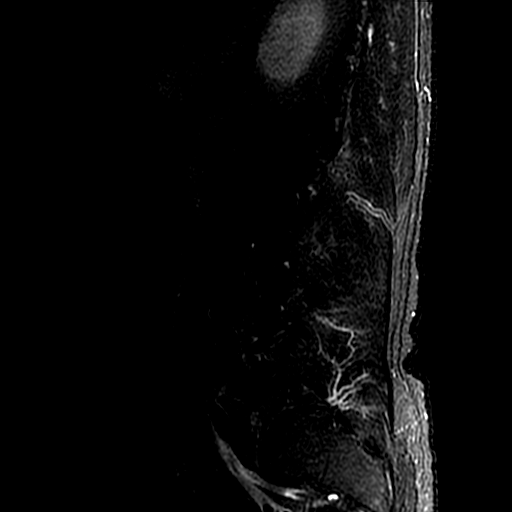

[Series 7: T2-star · axial · 3.0mm · 0.35mm/px · z∈[-194,-6]mm · 8 of 49 slices shown]
[im 3/49]
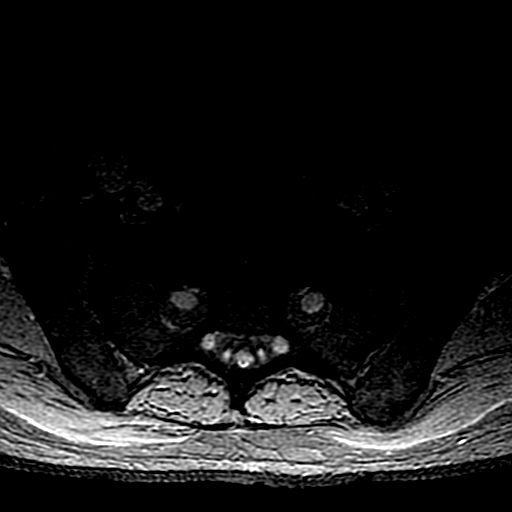
[im 7/49]
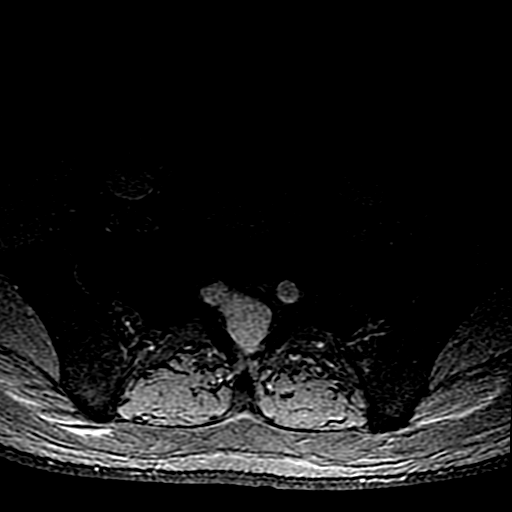
[im 9/49]
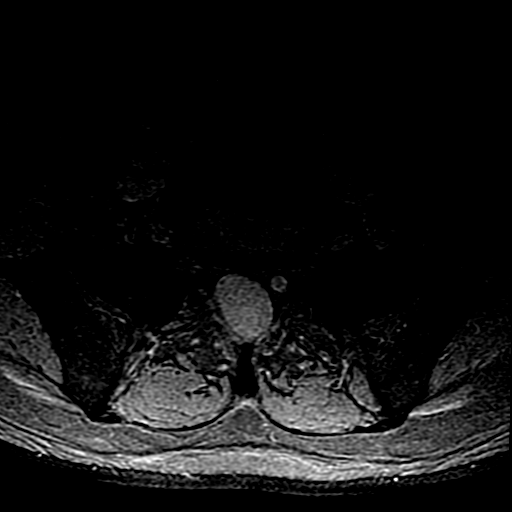
[im 16/49]
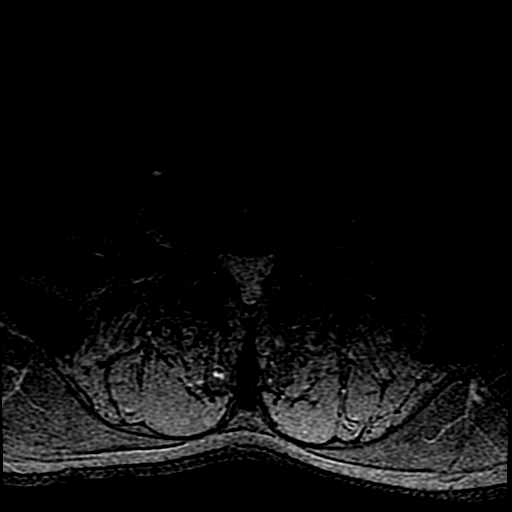
[im 22/49]
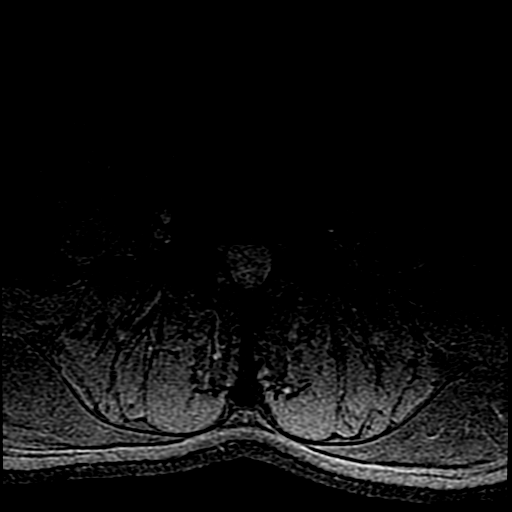
[im 25/49]
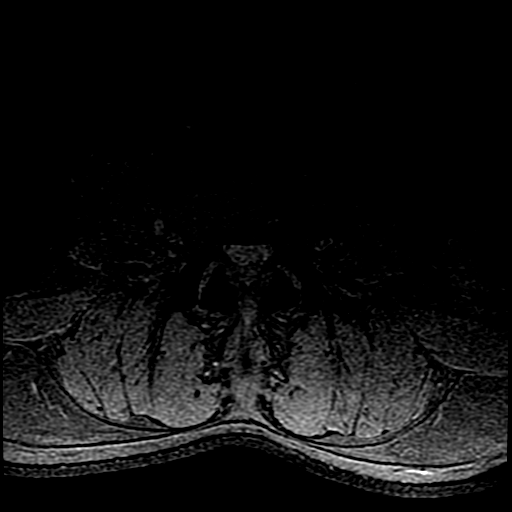
[im 27/49]
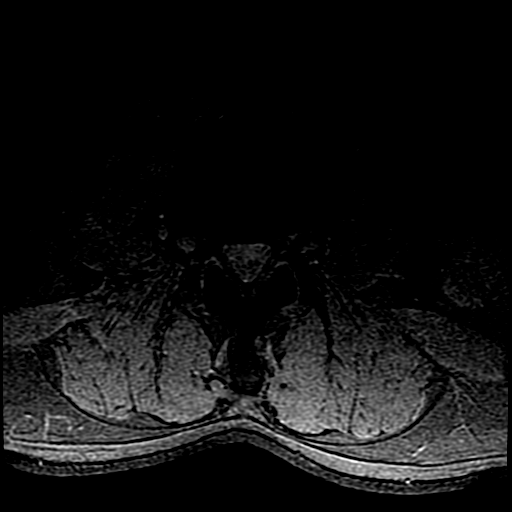
[im 42/49]
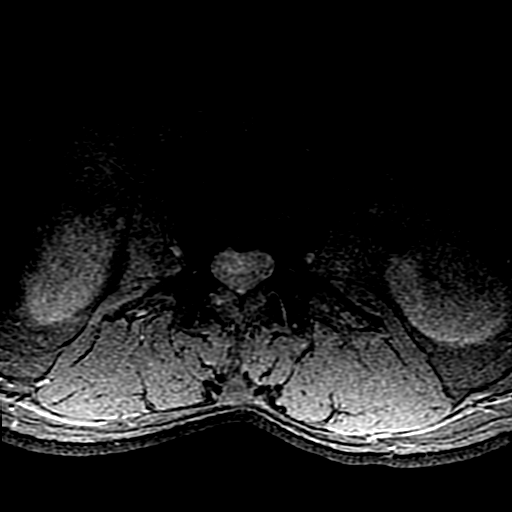

[29 of 48 positions shown; findings below may reference images not displayed]

FINDINGS: Segmentation:  5 lumbar type vertebral bodies.

Alignment:  Normal

Vertebrae:  No fracture or primary bone lesion.

Conus medullaris and cauda equina: Conus extends to the L1 level.
Conus and cauda equina appear normal.

Paraspinal and other soft tissues: Negative

Disc levels:

T12-L1: Normal

L1-2: Minimal disc desiccation. Minimal annular bulging. No
stenosis.

L2-3: Normal appearance of the disc. Minimal facet and ligamentous
prominence. No stenosis.

L3-4: Minimal bulging of the disc. Mild facet and ligamentous
hypertrophy. No compressive stenosis.

L4-5: Minimal annular bulging. Minimal facet and ligamentous
hypertrophy. No compressive stenosis.

L5-S1: Minimal bulging of the disc. No canal or foraminal stenosis.
No facet arthropathy.
IMPRESSION: No advanced finding. Minimal non-compressive disc bulges and mild
facet hypertrophy as outlined above.

## 2022-12-27 ENCOUNTER — Other Ambulatory Visit: Payer: PPO

## 2022-12-27 DIAGNOSIS — E1169 Type 2 diabetes mellitus with other specified complication: Secondary | ICD-10-CM | POA: Diagnosis not present

## 2022-12-27 DIAGNOSIS — E782 Mixed hyperlipidemia: Secondary | ICD-10-CM | POA: Diagnosis not present

## 2022-12-27 DIAGNOSIS — Z794 Long term (current) use of insulin: Secondary | ICD-10-CM

## 2022-12-27 DIAGNOSIS — I10 Essential (primary) hypertension: Secondary | ICD-10-CM

## 2022-12-27 LAB — CMP14+EGFR
ALT: 19 IU/L (ref 0–44)
AST: 19 IU/L (ref 0–40)
Albumin: 4.5 g/dL (ref 3.8–4.8)
Alkaline Phosphatase: 57 IU/L (ref 44–121)
BUN/Creatinine Ratio: 16 (ref 10–24)
BUN: 19 mg/dL (ref 8–27)
Bilirubin Total: 0.4 mg/dL (ref 0.0–1.2)
CO2: 24 mmol/L (ref 20–29)
Calcium: 9.8 mg/dL (ref 8.6–10.2)
Chloride: 101 mmol/L (ref 96–106)
Creatinine, Ser: 1.22 mg/dL (ref 0.76–1.27)
Globulin, Total: 2.6 g/dL (ref 1.5–4.5)
Glucose: 110 mg/dL — ABNORMAL HIGH (ref 70–99)
Potassium: 5.2 mmol/L (ref 3.5–5.2)
Sodium: 139 mmol/L (ref 134–144)
Total Protein: 7.1 g/dL (ref 6.0–8.5)
eGFR: 62 mL/min/{1.73_m2} (ref >59)

## 2022-12-27 LAB — CBC WITH DIFFERENTIAL/PLATELET
Basophils Absolute: 0 10*3/uL (ref 0.0–0.2)
Basos: 1 %
EOS (ABSOLUTE): 0.2 10*3/uL (ref 0.0–0.4)
Eos: 4 %
Hematocrit: 43.5 % (ref 37.5–51.0)
Hemoglobin: 13.8 g/dL (ref 13.0–17.7)
Immature Grans (Abs): 0 10*3/uL (ref 0.0–0.1)
Immature Granulocytes: 0 %
Lymphocytes Absolute: 2 10*3/uL (ref 0.7–3.1)
Lymphs: 36 %
MCH: 27.9 pg (ref 26.6–33.0)
MCHC: 31.7 g/dL (ref 31.5–35.7)
MCV: 88 fL (ref 79–97)
Monocytes Absolute: 0.5 10*3/uL (ref 0.1–0.9)
Monocytes: 9 %
Neutrophils Absolute: 2.9 10*3/uL (ref 1.4–7.0)
Neutrophils: 50 %
Platelets: 155 10*3/uL (ref 150–450)
RBC: 4.94 x10E6/uL (ref 4.14–5.80)
RDW: 13 % (ref 11.6–15.4)
WBC: 5.6 10*3/uL (ref 3.4–10.8)

## 2022-12-27 LAB — LIPID PANEL
Chol/HDL Ratio: 2.9 ratio (ref 0.0–5.0)
Cholesterol, Total: 146 mg/dL (ref 100–199)
HDL: 51 mg/dL (ref >39)
LDL Chol Calc (NIH): 82 mg/dL (ref 0–99)
Triglycerides: 62 mg/dL (ref 0–149)
VLDL Cholesterol Cal: 13 mg/dL (ref 5–40)

## 2022-12-27 LAB — BAYER DCA HB A1C WAIVED: HB A1C (BAYER DCA - WAIVED): 7.2 % — ABNORMAL HIGH (ref 4.8–5.6)

## 2023-01-02 ENCOUNTER — Encounter: Payer: Self-pay | Admitting: Family Medicine

## 2023-01-02 ENCOUNTER — Ambulatory Visit (INDEPENDENT_AMBULATORY_CARE_PROVIDER_SITE_OTHER): Payer: PPO | Admitting: Family Medicine

## 2023-01-02 VITALS — BP 126/55 | HR 77 | Ht 73.0 in | Wt 144.0 lb

## 2023-01-02 DIAGNOSIS — E1169 Type 2 diabetes mellitus with other specified complication: Secondary | ICD-10-CM

## 2023-01-02 DIAGNOSIS — Z23 Encounter for immunization: Secondary | ICD-10-CM

## 2023-01-02 DIAGNOSIS — Z7984 Long term (current) use of oral hypoglycemic drugs: Secondary | ICD-10-CM

## 2023-01-02 DIAGNOSIS — E1159 Type 2 diabetes mellitus with other circulatory complications: Secondary | ICD-10-CM

## 2023-01-02 DIAGNOSIS — E782 Mixed hyperlipidemia: Secondary | ICD-10-CM

## 2023-01-02 DIAGNOSIS — I10 Essential (primary) hypertension: Secondary | ICD-10-CM | POA: Diagnosis not present

## 2023-01-02 DIAGNOSIS — E119 Type 2 diabetes mellitus without complications: Secondary | ICD-10-CM | POA: Insufficient documentation

## 2023-01-02 NOTE — Progress Notes (Signed)
BP (!) 126/55   Pulse 77   Ht 6\' 1"  (1.854 m)   Wt 144 lb (65.3 kg)   SpO2 100%   BMI 19.00 kg/m    Subjective:   Patient ID: John Reese, male    DOB: March 15, 1951, 72 y.o.   MRN: [id number]  HPI: John Reese is a 72 y.o. male presenting on 01/02/2023 for Medical Management of Chronic Issues, Diabetes, and Hypertension   HPI Type 2 diabetes mellitus Patient comes in today for recheck of his diabetes. Patient has been currently taking Glyxambi. Patient is not currently on an ACE inhibitor/ARB. Patient has not seen an ophthalmologist this year. Patient denies any new issues with their feet. The symptom started onset as an adult hypertension and hyperlipidemia ARE RELATED TO DM   Hypertension Patient is currently on no medicine currently, diet control, and their blood pressure today is 126/55. Patient denies any lightheadedness or dizziness. Patient denies headaches, blurred vision, chest pains, shortness of breath, or weakness. Denies any side effects from medication and is content with current medication.   Hyperlipidemia Patient is coming in for recheck of his hyperlipidemia. The patient is currently taking fish oils and pravastatin. They deny any issues with myalgias or history of liver damage from it. They deny any focal numbness or weakness or chest pain.   Relevant past medical, surgical, family and social history reviewed and updated as indicated. Interim medical history since our last visit reviewed. Allergies and medications reviewed and updated.  Review of Systems  Constitutional:  Negative for chills and fever.  Eyes:  Negative for visual disturbance.  Respiratory:  Negative for shortness of breath and wheezing.   Cardiovascular:  Negative for chest pain and leg swelling.  Musculoskeletal:  Negative for back pain and gait problem.  Skin:  Negative for rash.  Neurological:  Negative for dizziness, weakness and light-headedness.  All other systems reviewed and are  negative.   Per HPI unless specifically indicated above   Allergies as of 01/02/2023   No Known Allergies      Medication List        Accurate as of January 02, 2023 10:13 AM. If you have any questions, ask your nurse or doctor.          aspirin EC 81 MG tablet Take 81 mg by mouth daily.   CINNAMON PO Take 1,000 mg by mouth daily.   fish oil-omega-3 fatty acids 1000 MG capsule Take 1 g by mouth daily.   Glyxambi 25-5 MG Tabs Generic drug: Empagliflozin-linaGLIPtin Take 1 tablet by mouth in the morning.   multivitamin tablet Take 1 tablet by mouth daily.   pravastatin 40 MG tablet Commonly known as: PRAVACHOL Take 1 tablet (40 mg total) by mouth daily.   Toujeo SoloStar 300 UNIT/ML Solostar Pen Generic drug: insulin glargine (1 Unit Dial) Inject 10 Units into the skin at bedtime.   UltiCare Mini Pen Needles 31G X 6 MM Misc Generic drug: Insulin Pen Needle USE AS DIRECTED AT BEDTIME         Objective:   BP (!) 126/55   Pulse 77   Ht 6\' 1"  (1.854 m)   Wt 144 lb (65.3 kg)   SpO2 100%   BMI 19.00 kg/m   Wt Readings from Last 3 Encounters:  01/02/23 144 lb (65.3 kg)  09/26/22 146 lb (66.2 kg)  09/03/22 148 lb (67.1 kg)    Physical Exam Vitals and nursing note reviewed.  Constitutional:  General: He is not in acute distress.    Appearance: He is well-developed. He is not diaphoretic.  Eyes:     General: No scleral icterus.    Conjunctiva/sclera: Conjunctivae normal.  Neck:     Thyroid: No thyromegaly.  Cardiovascular:     Rate and Rhythm: Normal rate and regular rhythm.     Heart sounds: Normal heart sounds. No murmur heard. Pulmonary:     Effort: Pulmonary effort is normal. No respiratory distress.     Breath sounds: Normal breath sounds. No wheezing.  Musculoskeletal:        General: Normal range of motion.     Cervical back: Neck supple.  Lymphadenopathy:     Cervical: No cervical adenopathy.  Skin:    General: Skin is warm  and dry.     Findings: No rash.  Neurological:     Mental Status: He is alert and oriented to person, place, and time.     Coordination: Coordination normal.  Psychiatric:        Behavior: Behavior normal.     Results for orders placed or performed in visit on 12/27/22  Bayer DCA Hb A1c Waived  Result Value Ref Range   HB A1C (BAYER DCA - WAIVED) 7.2 (H) 4.8 - 5.6 %  Lipid panel  Result Value Ref Range   Cholesterol, Total 146 100 - 199 mg/dL   Triglycerides 62 0 - 149 mg/dL   HDL 51 >14 mg/dL   VLDL Cholesterol Cal 13 5 - 40 mg/dL   LDL Chol Calc (NIH) 82 0 - 99 mg/dL   Chol/HDL Ratio 2.9 0.0 - 5.0 ratio  CBC with Differential/Platelet  Result Value Ref Range   WBC 5.6 3.4 - 10.8 x10E3/uL   RBC 4.94 4.14 - 5.80 x10E6/uL   Hemoglobin 13.8 13.0 - 17.7 g/dL   Hematocrit 78.2 95.6 - 51.0 %   MCV 88 79 - 97 fL   MCH 27.9 26.6 - 33.0 pg   MCHC 31.7 31.5 - 35.7 g/dL   RDW 21.3 08.6 - 57.8 %   Platelets 155 150 - 450 x10E3/uL   Neutrophils 50 Not Estab. %   Lymphs 36 Not Estab. %   Monocytes 9 Not Estab. %   Eos 4 Not Estab. %   Basos 1 Not Estab. %   Neutrophils Absolute 2.9 1.4 - 7.0 x10E3/uL   Lymphocytes Absolute 2.0 0.7 - 3.1 x10E3/uL   Monocytes Absolute 0.5 0.1 - 0.9 x10E3/uL   EOS (ABSOLUTE) 0.2 0.0 - 0.4 x10E3/uL   Basophils Absolute 0.0 0.0 - 0.2 x10E3/uL   Immature Granulocytes 0 Not Estab. %   Immature Grans (Abs) 0.0 0.0 - 0.1 x10E3/uL  CMP14+EGFR  Result Value Ref Range   Glucose 110 (H) 70 - 99 mg/dL   BUN 19 8 - 27 mg/dL   Creatinine, Ser 4.69 0.76 - 1.27 mg/dL   eGFR 62 >62 XB/MWU/1.32   BUN/Creatinine Ratio 16 10 - 24   Sodium 139 134 - 144 mmol/L   Potassium 5.2 3.5 - 5.2 mmol/L   Chloride 101 96 - 106 mmol/L   CO2 24 20 - 29 mmol/L   Calcium 9.8 8.6 - 10.2 mg/dL   Total Protein 7.1 6.0 - 8.5 g/dL   Albumin 4.5 3.8 - 4.8 g/dL   Globulin, Total 2.6 1.5 - 4.5 g/dL   Bilirubin Total 0.4 0.0 - 1.2 mg/dL   Alkaline Phosphatase 57 44 - 121 IU/L    AST 19 0 - 40 IU/L  ALT 19 0 - 44 IU/L    Assessment & Plan:   Problem List Items Addressed This Visit       Cardiovascular and Mediastinum   Essential hypertension     Endocrine   Type 2 diabetes mellitus with other specified complication (HCC) - Primary   Diabetes mellitus treated with oral medication (HCC)     Other   Mixed hyperlipidemia  A1c is better at 7.2, continue with diet.  Blood pressure looks good and everything else looks good.  No changes  Follow up plan: Return in about 3 months (around 04/03/2023), or if symptoms worsen or fail to improve, for Diabetes and hypertension recheck.  Counseling provided for all of the vaccine components No orders of the defined types were placed in this encounter.   Arville Care, MD [facility] 01/02/2023, 10:13 AM

## 2023-01-28 NOTE — Progress Notes (Unsigned)
Assessment/Plan:    1.  Essential Tremor   -Status post DBS surgery.  IPG changed March 22, 2016, and November 01, 2020.  2.  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  -pt now off of lyrica and carbamazepine  -had V2/V3 rhizotomy with Dr. Venetia Maxon 10/2019  -had repeat V2/V3 rhizotomy with Dr. Venetia Maxon October, 2021.  Great results and remains pain-free. 3.  Leg pain  -Following with primary care and felt due to iliotibial band syndrome   Subjective:   John Reese was seen today in follow up for essential tremor.  My previous records were reviewed prior to todays visit.  Patient with wife who supplements history.  Seems to be doing well with his DBS and with tremor.  Last visit, I told him to talk to his primary care about his leg pain and he did that and it was felt to be due to iliotibial band syndrome.  He is following with primary care for diabetes.  His last A1c was 7.2.  Current prescribed movement disorder medications: None (weaned off)  Prior meds: Carbamazepine; Lyrica (myoclonus with higher dosages)   ALLERGIES:   No Known Allergies   CURRENT MEDICATIONS:  Outpatient Encounter Medications as of 01/29/2023  Medication Sig   aspirin EC 81 MG tablet Take 81 mg by mouth daily.   CINNAMON PO Take 1,000 mg by mouth daily.   Empagliflozin-linaGLIPtin (GLYXAMBI) 25-5 MG TABS Take 1 tablet by mouth in the morning.   fish oil-omega-3 fatty acids 1000 MG capsule Take 1 g by mouth daily.   insulin glargine, 1 Unit Dial, (TOUJEO SOLOSTAR) 300 UNIT/ML Solostar Pen Inject 10 Units into the skin at bedtime.   Insulin Pen Needle (ULTICARE MINI PEN NEEDLES) 31G X 6 MM MISC USE AS DIRECTED AT BEDTIME   Multiple Vitamin (MULTIVITAMIN) tablet Take 1 tablet by mouth daily.   pravastatin (PRAVACHOL) 40 MG tablet Take 1 tablet (40 mg total) by mouth daily.   No facility-administered encounter medications on file as of  01/29/2023.     Objective:    PHYSICAL EXAMINATION:    VITALS:   There were no vitals filed for this visit.  Wt Readings from Last 3 Encounters:  01/02/23 144 lb (65.3 kg)  09/26/22 146 lb (66.2 kg)  09/03/22 148 lb (67.1 kg)       GEN:  The patient appears stated age and is in NAD. HEENT:  Normocephalic, atraumatic.  The mucous membranes are moist. The superficial temporal arteries are without ropiness or tenderness.   Neurological examination:  Orientation: The patient is alert and oriented x3. Cranial nerves: There is good facial symmetry. The speech is fluent and clear. Soft palate rises symmetrically and there is no tongue deviation. Hearing is intact to conversational tone. Sensation: Sensation is intact to light touch throughout Motor: Strength is at least antigravity x4.  Movement examination: Tone: There is normal tone in the UE/LE Abnormal movements: none.  No rest tremor.  No postural tremor.  Slight tremor when given away in the right hand.  This is corrected post DBS adjustment. Coordination:  There is no decremation with RAM's Gait and Station: The patient has no difficulty arising out of a deep-seated chair without the use of the hands. The patient's stride length is good I have reviewed and interpreted the following labs independently   Chemistry      Component Value Date/Time   NA 139 12/27/2022 1610  K 5.2 12/27/2022 0833   CL 101 12/27/2022 0833   CO2 24 12/27/2022 0833   BUN 19 12/27/2022 0833   CREATININE 1.22 12/27/2022 0833   CREATININE 0.96 10/06/2019 0925      Component Value Date/Time   CALCIUM 9.8 12/27/2022 0833   ALKPHOS 57 12/27/2022 0833   AST 19 12/27/2022 0833   ALT 19 12/27/2022 0833   BILITOT 0.4 12/27/2022 0833      Lab Results  Component Value Date   WBC 5.6 12/27/2022   HGB 13.8 12/27/2022   HCT 43.5 12/27/2022   MCV 88 12/27/2022   PLT 155 12/27/2022   Lab Results  Component Value Date   TSH 1.39 10/06/2019      Chemistry      Component Value Date/Time   NA 139 12/27/2022 0833   K 5.2 12/27/2022 0833   CL 101 12/27/2022 0833   CO2 24 12/27/2022 0833   BUN 19 12/27/2022 0833   CREATININE 1.22 12/27/2022 0833   CREATININE 0.96 10/06/2019 0925      Component Value Date/Time   CALCIUM 9.8 12/27/2022 0833   ALKPHOS 57 12/27/2022 0833   AST 19 12/27/2022 0833   ALT 19 12/27/2022 0833   BILITOT 0.4 12/27/2022 0833     Total time spent on today's visit was *** minutes, including both face-to-face time and nonface-to-face time.  Time included that spent on review of records (prior notes available to me/labs/imaging if pertinent), discussing treatment and goals, answering patient's questions and coordinating care.  This didn't include dbs time   Cc:  [name], Elige Radon, MD

## 2023-01-29 ENCOUNTER — Encounter: Payer: Self-pay | Admitting: Neurology

## 2023-01-29 ENCOUNTER — Ambulatory Visit: Payer: PPO | Admitting: Neurology

## 2023-01-29 VITALS — BP 128/72 | HR 71 | Ht 73.0 in | Wt 144.8 lb

## 2023-01-29 DIAGNOSIS — G500 Trigeminal neuralgia: Secondary | ICD-10-CM | POA: Diagnosis not present

## 2023-01-29 DIAGNOSIS — G250 Essential tremor: Secondary | ICD-10-CM

## 2023-01-29 DIAGNOSIS — Z9689 Presence of other specified functional implants: Secondary | ICD-10-CM | POA: Diagnosis not present

## 2023-01-29 NOTE — Procedures (Signed)
DBS Programming was performed.    Manufacturer of DBS device: Medtronic  Total time spent programming was 15 minutes.  Device was confirmed to be on.  Soft start was confirmed to be on.  Impedences were checked and were within normal limits.  Battery was checked and was determined to be functioning normally and not near end of life  Final settings were as follows:   Active Contact Amplitude (V) PW (ms) Frequency (hz) Side Effects Battery  Left Brain 1-2+ 2.8 90 150  2.85  02/02/20        10/04/20 1-2+ 2.8 90 150  2.75  04/12/21 1-2+ 2.8 90 150  2.97  04/24/22 1-2+ 3.0 90 150  2.94  01/29/23 1-2+ 3.1 90 150  2.90          Right Brain        02/02/20 1-2+ 2.7 90 150  2.83  10/04/20 1-2+ 2.7 90 150  2.58  04/12/21 1-2+ 2.7 90 150  2.97  04/24/22 1-2+ 2.7 90 150  2.95  01/29/23 1-2+ 2.7 90 150  2.92

## 2023-01-29 NOTE — Patient Instructions (Signed)
Good to see you!  Your DBS is working well!  The physicians and staff at [facility] are committed to providing excellent care. You may receive a survey requesting feedback about your experience at our office. We strive to receive "very good" responses to the survey questions. If you feel that your experience would prevent you from giving the office a "very good " response, please contact our office to try to remedy the situation. We may be reached at [phone]. Thank you for taking the time out of your busy day to complete the survey.

## 2023-03-12 ENCOUNTER — Telehealth: Payer: Self-pay

## 2023-03-12 DIAGNOSIS — E1169 Type 2 diabetes mellitus with other specified complication: Secondary | ICD-10-CM

## 2023-03-12 NOTE — Progress Notes (Deleted)
Pharmacy Medication Assistance Program Note    03/26/2023  Patient ID: John Reese, male   DOB: 30-May-1950, 72 y.o.   MRN: [id number]     03/12/2023  Outreach Medication One  Initial Outreach Date (Medication One) 02/04/2023  Manufacturer Medication One [employer]  [employer] Drugs Glxambi  Dose of Glyxambi 25-5mg   Type of Radiographer, therapeutic Assistance  Date Application Sent to Patient 02/13/2023  Application Items Requested Application  Date Application Sent to Prescriber 03/12/2023  Name of Prescriber Ivin Booty [name]  Date Application Received From Patient 03/06/2023  Application Items Received From Patient Application  Date Application Received From Provider 03/14/2023  Date Application Submitted to Manufacturer 03/26/2023  Method Application Sent to Manufacturer Fax       Renewal application.

## 2023-04-02 MED ORDER — GLYXAMBI 25-5 MG PO TABS
1.0000 | ORAL_TABLET | Freq: Every morning | ORAL | 5 refills | Status: DC
Start: 2023-04-02 — End: 2023-04-03

## 2023-04-02 NOTE — Telephone Encounter (Signed)
Pt's wife left message regarding John Reese almost being out of Glyxambi medication.  Are samples available? And also, could a refill rx be sent to [facility] pharmacy for patient to have a refill mailed to them from [facility]?

## 2023-04-02 NOTE — Telephone Encounter (Signed)
Refills sent They don't sample glyxambi anymore I could see if we have jardiance on Thursday when I'm back in office Thank you!

## 2023-04-03 ENCOUNTER — Encounter: Payer: Self-pay | Admitting: Family Medicine

## 2023-04-03 ENCOUNTER — Other Ambulatory Visit: Payer: Self-pay | Admitting: Family Medicine

## 2023-04-03 ENCOUNTER — Ambulatory Visit: Payer: PPO | Admitting: Family Medicine

## 2023-04-03 VITALS — BP 135/78 | HR 97 | Ht 73.0 in | Wt 144.0 lb

## 2023-04-03 DIAGNOSIS — E1169 Type 2 diabetes mellitus with other specified complication: Secondary | ICD-10-CM

## 2023-04-03 DIAGNOSIS — E782 Mixed hyperlipidemia: Secondary | ICD-10-CM | POA: Diagnosis not present

## 2023-04-03 DIAGNOSIS — Z7984 Long term (current) use of oral hypoglycemic drugs: Secondary | ICD-10-CM

## 2023-04-03 DIAGNOSIS — E119 Type 2 diabetes mellitus without complications: Secondary | ICD-10-CM

## 2023-04-03 DIAGNOSIS — R0981 Nasal congestion: Secondary | ICD-10-CM

## 2023-04-03 DIAGNOSIS — I10 Essential (primary) hypertension: Secondary | ICD-10-CM

## 2023-04-03 LAB — BAYER DCA HB A1C WAIVED: HB A1C (BAYER DCA - WAIVED): 7.6 % — ABNORMAL HIGH (ref 4.8–5.6)

## 2023-04-03 MED ORDER — GUAIFENESIN ER 600 MG PO TB12
600.0000 mg | ORAL_TABLET | Freq: Two times a day (BID) | ORAL | 1 refills | Status: DC
Start: 2023-04-03 — End: 2023-10-28

## 2023-04-03 MED ORDER — DIPHENHYDRAMINE HCL 25 MG PO CAPS: 25.0000 mg | ORAL_CAPSULE | Freq: Three times a day (TID) | ORAL | 1 refills | Status: DC | PRN

## 2023-04-03 MED ORDER — FLUTICASONE PROPIONATE 50 MCG/ACT NA SUSP
1.0000 | Freq: Two times a day (BID) | NASAL | 6 refills | Status: DC | PRN
Start: 2023-04-03 — End: 2023-10-28

## 2023-04-03 MED ORDER — ULTICARE MINI PEN NEEDLES 31G X 6 MM MISC: 1 refills | Status: DC

## 2023-04-03 NOTE — Telephone Encounter (Signed)
Copied from CRM [id number]. Topic: Clinical - Medication Question >> Apr 03, 2023 11:26 AM [name] wrote: Reason for CRM: Pt called because they are wanting to know if the insulin needles that he sticks himself with have been called in at the pharmacy for him, says he was told they would be called in after his visit today with Dr. Louanne Skye

## 2023-04-03 NOTE — Telephone Encounter (Signed)
Pt aware pen needles sent to pharmacy 

## 2023-04-03 NOTE — Progress Notes (Signed)
BP 135/78   Pulse 97   Ht 6\' 1"  (1.854 m)   Wt 144 lb (65.3 kg)   SpO2 96%   BMI 19.00 kg/m    Subjective:   Patient ID: John Reese, male    DOB: September 28, 1950, 72 y.o.   MRN: [id number]  HPI: John Reese is a 72 y.o. male presenting on 04/03/2023 for Medical Management of Chronic Issues, Diabetes, Hyperlipidemia, and Hypertension   HPI Type 2 diabetes mellitus Patient comes in today for recheck of his diabetes. Patient has been currently taking Glyxambi and Toujeo. Patient is not currently on an ACE inhibitor/ARB. Patient has seen an ophthalmologist this year. Patient denies any new issues with their feet. The symptom started onset as an adult hypertension and CAD and hyperlipidemia ARE RELATED TO DM   Hypertension Patient is currently on no medicine currently, diet controlled, and their blood pressure today is 135/70. Patient denies any lightheadedness or dizziness. Patient denies headaches, blurred vision, chest pains, shortness of breath, or weakness. Denies any side effects from medication and is content with current medication.   Hyperlipidemia and CAD recheck Patient is coming in for recheck of his hyperlipidemia. The patient is currently taking pravastatin and fish oils. They deny any issues with myalgias or history of liver damage from it. They deny any focal numbness or weakness or chest pain.   Having cough congestion has been going on for 2 months.  It started out as a cold but he just cannot clear it, he still has a cough and some drainage and some congestion.  Relevant past medical, surgical, family and social history reviewed and updated as indicated. Interim medical history since our last visit reviewed. Allergies and medications reviewed and updated.  Review of Systems  Constitutional:  Negative for chills and fever.  HENT:  Positive for congestion, postnasal drip and rhinorrhea. Negative for ear discharge, ear pain, sinus pressure, sneezing, sore throat and  voice change.   Eyes:  Negative for pain, discharge, redness and visual disturbance.  Respiratory:  Positive for cough. Negative for shortness of breath and wheezing.   Cardiovascular:  Negative for chest pain and leg swelling.  Musculoskeletal:  Negative for gait problem.  Skin:  Negative for rash.  All other systems reviewed and are negative.   Per HPI unless specifically indicated above   Allergies as of 04/03/2023   No Known Allergies      Medication List        Accurate as of April 03, 2023  8:45 AM. If you have any questions, ask your nurse or doctor.          aspirin EC 81 MG tablet Take 81 mg by mouth daily.   CINNAMON PO Take 1,000 mg by mouth daily.   diphenhydrAMINE 25 mg capsule Commonly known as: Benadryl Allergy Take 1 capsule (25 mg total) by mouth every 8 (eight) hours as needed (congestion). Started by: Elige Radon Manvi Guilliams   fish oil-omega-3 fatty acids 1000 MG capsule Take 1 g by mouth daily.   fluticasone 50 MCG/ACT nasal spray Commonly known as: FLONASE Place 1 spray into both nostrils 2 (two) times daily as needed for allergies or rhinitis. Started by: Elige Radon Semisi Biela   Glyxambi 25-5 MG Tabs Generic drug: Empagliflozin-linaGLIPtin Take 1 tablet by mouth in the morning.   guaiFENesin 600 MG 12 hr tablet Commonly known as: Mucinex Take 1 tablet (600 mg total) by mouth 2 (two) times daily. Started by: Elige Radon Kannan Proia  multivitamin tablet Take 1 tablet by mouth daily.   pravastatin 40 MG tablet Commonly known as: PRAVACHOL Take 1 tablet (40 mg total) by mouth daily.   Toujeo SoloStar 300 UNIT/ML Solostar Pen Generic drug: insulin glargine (1 Unit Dial) Inject 10 Units into the skin at bedtime.   UltiCare Mini Pen Needles 31G X 6 MM Misc Generic drug: Insulin Pen Needle USE AS DIRECTED AT BEDTIME         Objective:   BP 135/78   Pulse 97   Ht 6\' 1"  (1.854 m)   Wt 144 lb (65.3 kg)   SpO2 96%   BMI 19.00 kg/m    Wt Readings from Last 3 Encounters:  04/03/23 144 lb (65.3 kg)  01/29/23 144 lb 12.8 oz (65.7 kg)  01/02/23 144 lb (65.3 kg)    Physical Exam Vitals and nursing note reviewed.  Constitutional:      General: He is not in acute distress.    Appearance: He is well-developed. He is not diaphoretic.  Eyes:     General: No scleral icterus.    Conjunctiva/sclera: Conjunctivae normal.  Neck:     Thyroid: No thyromegaly.  Cardiovascular:     Rate and Rhythm: Normal rate and regular rhythm.     Heart sounds: Normal heart sounds. No murmur heard. Pulmonary:     Effort: Pulmonary effort is normal. No respiratory distress.     Breath sounds: Normal breath sounds. No wheezing.  Musculoskeletal:        General: No swelling. Normal range of motion.     Cervical back: Neck supple.  Lymphadenopathy:     Cervical: No cervical adenopathy.  Skin:    General: Skin is warm and dry.     Findings: No rash.  Neurological:     Mental Status: He is alert and oriented to person, place, and time.     Coordination: Coordination normal.  Psychiatric:        Behavior: Behavior normal.     Results for orders placed or performed in visit on 01/02/23  HM DIABETES EYE EXAM  Result Value Ref Range   HM Diabetic Eye Exam No Retinopathy No Retinopathy    Assessment & Plan:   Problem List Items Addressed This Visit       Cardiovascular and Mediastinum   Essential hypertension     Endocrine   Type 2 diabetes mellitus with other specified complication (HCC) - Primary   Relevant Orders   Bayer DCA Hb A1c Waived   Microalbumin / creatinine urine ratio   Diabetes mellitus treated with oral medication (HCC)     Other   Mixed hyperlipidemia   Other Visit Diagnoses     Nasal congestion       Relevant Medications   diphenhydrAMINE (BENADRYL ALLERGY) 25 mg capsule   guaiFENesin (MUCINEX) 600 MG 12 hr tablet   fluticasone (FLONASE) 50 MCG/ACT nasal spray     A1c 7.6 which is slightly up from  before.  He says it has been like this since ever he has had his cold, will do some things to help with his cold Patient's blood pressure and everything else looks good today.  Focus on exercise and getting the blood sugars back down. Follow up plan: Return in about 3 months (around 07/02/2023), or if symptoms worsen or fail to improve, for Diabetes recheck.  Counseling provided for all of the vaccine components Orders Placed This Encounter  Procedures   Bayer DCA Hb A1c Waived  Microalbumin / creatinine urine ratio    Arville Care, MD [facility] 04/03/2023, 8:45 AM

## 2023-05-16 NOTE — Progress Notes (Signed)
Pharmacy Medication Assistance Program Note    05/16/2023  Patient ID: John Reese, male   DOB: 1950/06/10, 73 y.o.   MRN: [id number]     03/12/2023  Outreach Medication One  Initial Outreach Date (Medication One) 02/04/2023  Manufacturer Medication One [employer]  [employer] Drugs Glxambi  Dose of Glyxambi 25-5mg   Type of Radiographer, therapeutic Assistance  Date Application Sent to Patient 02/13/2023  Application Items Requested Application  Date Application Sent to Prescriber 03/12/2023  Name of Prescriber Ivin Booty [name]  Date Application Received From Patient 03/06/2023  Application Items Received From Patient Application  Date Application Received From Provider 03/14/2023  Date Application Submitted to Manufacturer 03/26/2023  Method Application Sent to Manufacturer Fax  Patient Assistance Determination Approved  Approval Start Date 04/10/2023  Approval End Date 04/22/2024

## 2023-05-21 LAB — HM DIABETES EYE EXAM

## 2023-05-27 DIAGNOSIS — G500 Trigeminal neuralgia: Secondary | ICD-10-CM | POA: Diagnosis not present

## 2023-06-24 ENCOUNTER — Other Ambulatory Visit: Payer: Self-pay | Admitting: Family Medicine

## 2023-06-24 DIAGNOSIS — E1169 Type 2 diabetes mellitus with other specified complication: Secondary | ICD-10-CM

## 2023-06-26 ENCOUNTER — Telehealth: Payer: Self-pay | Admitting: Neurology

## 2023-06-26 NOTE — Telephone Encounter (Signed)
 I called an advised, thanked me for calling them back.

## 2023-06-26 NOTE — Telephone Encounter (Signed)
 Pt's wife called in stating they had an appointment at [facility] for the nerve blockage. That doctor told them they don't do that. They ended up waiting a month and got a call yesterday from a place in [place]. They don't want to go all the way to [place]. She wants to find out if Dr. Arbutus Leas knows someone in [place]? They would like [place] to refer them to someone.

## 2023-06-27 ENCOUNTER — Other Ambulatory Visit: Payer: PPO

## 2023-06-27 DIAGNOSIS — I10 Essential (primary) hypertension: Secondary | ICD-10-CM

## 2023-06-27 DIAGNOSIS — E782 Mixed hyperlipidemia: Secondary | ICD-10-CM

## 2023-06-27 DIAGNOSIS — E1169 Type 2 diabetes mellitus with other specified complication: Secondary | ICD-10-CM | POA: Diagnosis not present

## 2023-06-27 DIAGNOSIS — Z125 Encounter for screening for malignant neoplasm of prostate: Secondary | ICD-10-CM

## 2023-06-27 LAB — BAYER DCA HB A1C WAIVED: HB A1C (BAYER DCA - WAIVED): 7.6 % — ABNORMAL HIGH (ref 4.8–5.6)

## 2023-06-28 LAB — CBC WITH DIFFERENTIAL/PLATELET
Basophils Absolute: 0 10*3/uL (ref 0.0–0.2)
Basos: 1 %
EOS (ABSOLUTE): 0.1 10*3/uL (ref 0.0–0.4)
Eos: 2 %
Hematocrit: 39 % (ref 37.5–51.0)
Hemoglobin: 12.7 g/dL — ABNORMAL LOW (ref 13.0–17.7)
Immature Grans (Abs): 0 10*3/uL (ref 0.0–0.1)
Immature Granulocytes: 0 %
Lymphocytes Absolute: 1.5 10*3/uL (ref 0.7–3.1)
Lymphs: 26 %
MCH: 28.7 pg (ref 26.6–33.0)
MCHC: 32.6 g/dL (ref 31.5–35.7)
MCV: 88 fL (ref 79–97)
Monocytes Absolute: 0.6 10*3/uL (ref 0.1–0.9)
Monocytes: 10 %
Neutrophils Absolute: 3.6 10*3/uL (ref 1.4–7.0)
Neutrophils: 61 %
Platelets: 194 10*3/uL (ref 150–450)
RBC: 4.42 x10E6/uL (ref 4.14–5.80)
RDW: 13.1 % (ref 11.6–15.4)
WBC: 5.9 10*3/uL (ref 3.4–10.8)

## 2023-06-28 LAB — CMP14+EGFR
ALT: 17 IU/L (ref 0–44)
AST: 18 IU/L (ref 0–40)
Albumin: 4.3 g/dL (ref 3.8–4.8)
Alkaline Phosphatase: 70 IU/L (ref 44–121)
BUN/Creatinine Ratio: 14 (ref 10–24)
BUN: 15 mg/dL (ref 8–27)
Bilirubin Total: 0.3 mg/dL (ref 0.0–1.2)
CO2: 23 mmol/L (ref 20–29)
Calcium: 9.4 mg/dL (ref 8.6–10.2)
Chloride: 99 mmol/L (ref 96–106)
Creatinine, Ser: 1.05 mg/dL (ref 0.76–1.27)
Globulin, Total: 2.4 g/dL (ref 1.5–4.5)
Glucose: 103 mg/dL — ABNORMAL HIGH (ref 70–99)
Potassium: 5.2 mmol/L (ref 3.5–5.2)
Sodium: 135 mmol/L (ref 134–144)
Total Protein: 6.7 g/dL (ref 6.0–8.5)
eGFR: 74 mL/min/{1.73_m2} (ref >59)

## 2023-06-28 LAB — PSA, TOTAL AND FREE
PSA, Free Pct: 15.5 %
PSA, Free: 0.51 ng/mL
Prostate Specific Ag, Serum: 3.3 ng/mL (ref 0.0–4.0)

## 2023-06-28 LAB — LIPID PANEL
Chol/HDL Ratio: 2.8 ratio (ref 0.0–5.0)
Cholesterol, Total: 139 mg/dL (ref 100–199)
HDL: 49 mg/dL (ref >39)
LDL Chol Calc (NIH): 77 mg/dL (ref 0–99)
Triglycerides: 64 mg/dL (ref 0–149)
VLDL Cholesterol Cal: 13 mg/dL (ref 5–40)

## 2023-07-01 ENCOUNTER — Encounter: Payer: Self-pay | Admitting: Family Medicine

## 2023-07-05 ENCOUNTER — Ambulatory Visit: Payer: PPO | Admitting: Family Medicine

## 2023-07-05 ENCOUNTER — Encounter: Payer: Self-pay | Admitting: Family Medicine

## 2023-07-05 VITALS — BP 136/62 | HR 78 | Ht 73.0 in | Wt 144.0 lb

## 2023-07-05 DIAGNOSIS — E1169 Type 2 diabetes mellitus with other specified complication: Secondary | ICD-10-CM

## 2023-07-05 DIAGNOSIS — I10 Essential (primary) hypertension: Secondary | ICD-10-CM | POA: Diagnosis not present

## 2023-07-05 DIAGNOSIS — E782 Mixed hyperlipidemia: Secondary | ICD-10-CM

## 2023-07-05 DIAGNOSIS — G500 Trigeminal neuralgia: Secondary | ICD-10-CM | POA: Diagnosis not present

## 2023-07-05 DIAGNOSIS — Z7984 Long term (current) use of oral hypoglycemic drugs: Secondary | ICD-10-CM

## 2023-07-05 DIAGNOSIS — Z794 Long term (current) use of insulin: Secondary | ICD-10-CM | POA: Diagnosis not present

## 2023-07-05 DIAGNOSIS — E119 Type 2 diabetes mellitus without complications: Secondary | ICD-10-CM

## 2023-07-05 MED ORDER — GLYXAMBI 25-5 MG PO TABS: 1.0000 | ORAL_TABLET | Freq: Every day | ORAL | 3 refills | Status: DC

## 2023-07-05 MED ORDER — OXCARBAZEPINE 300 MG PO TABS: 300.0000 mg | ORAL_TABLET | Freq: Two times a day (BID) | ORAL | 1 refills | Status: DC

## 2023-07-05 MED ORDER — PRAVASTATIN SODIUM 40 MG PO TABS: 40.0000 mg | ORAL_TABLET | Freq: Every day | ORAL | 3 refills | Status: DC

## 2023-07-05 MED ORDER — TOUJEO SOLOSTAR 300 UNIT/ML ~~LOC~~ SOPN: 10.0000 [IU] | PEN_INJECTOR | Freq: Every day | SUBCUTANEOUS | 3 refills | Status: DC

## 2023-07-05 NOTE — Addendum Note (Signed)
 Addended by: Arville Care on: 07/05/2023 08:42 AM   Modules accepted: Orders

## 2023-07-05 NOTE — Progress Notes (Addendum)
 BP 136/62   Pulse 78   Ht 6\' 1"  (1.854 m)   Wt 144 lb (65.3 kg)   SpO2 100%   BMI 19.00 kg/m    Subjective:   Patient ID: John Reese, male    DOB: 10-Mar-1951, 73 y.o.   MRN: [id number]  HPI: John Reese is a 73 y.o. male presenting on 07/05/2023 for Medical Management of Chronic Issues, Diabetes, and Hypertension   HPI Type 2 diabetes mellitus Patient comes in today for recheck of his diabetes. Patient has been currently taking Glyxambi and Toujeo, he says his insulin he has been using at 10 units and his blood sugars are running between 80 and 100 and 10 in the morning but running up as high as 250.  He says has not been feeling as well and so has not been walking as much and feels like that is why his blood sugars are running higher.. Patient is not currently on an ACE inhibitor/ARB. Patient has not seen an ophthalmologist this year. Patient denies any new issues with their feet. The symptom started onset as an adult hypertension and hyperlipidemia ARE RELATED TO DM   Hypertension Patient is currently on no medication currently, and their blood pressure today is 136/62. Patient denies any lightheadedness or dizziness. Patient denies headaches, blurred vision, chest pains, shortness of breath, or weakness. Denies any side effects from medication and is content with current medication.   Hyperlipidemia Patient is coming in for recheck of his hyperlipidemia. The patient is currently taking pravastatin. They deny any issues with myalgias or history of liver damage from it. They deny any focal numbness or weakness or chest pain.   Trigeminal neuralgia Years ago patient had trigeminal neuralgia and took carbamazepine until he can get some injections to numb the nerve.  He is trying to get back in to get injections again because it is just recently flared up so wants to get some of the oxcarbazepine until he can get in for injections.  Relevant past medical, surgical, family and  social history reviewed and updated as indicated. Interim medical history since our last visit reviewed. Allergies and medications reviewed and updated.  Review of Systems  Constitutional:  Negative for chills and fever.  Eyes:  Negative for visual disturbance.  Respiratory:  Negative for shortness of breath and wheezing.   Cardiovascular:  Negative for chest pain and leg swelling.  Musculoskeletal:  Negative for back pain and gait problem.  Skin:  Negative for rash.  Neurological:  Negative for dizziness and light-headedness.  All other systems reviewed and are negative.   Per HPI unless specifically indicated above   Allergies as of 07/05/2023   No Known Allergies      Medication List        Accurate as of July 05, 2023  8:42 AM. If you have any questions, ask your nurse or doctor.          aspirin EC 81 MG tablet Take 81 mg by mouth daily.   CINNAMON PO Take 1,000 mg by mouth daily.   diphenhydrAMINE 25 mg capsule Commonly known as: Benadryl Allergy Take 1 capsule (25 mg total) by mouth every 8 (eight) hours as needed (congestion).   fish oil-omega-3 fatty acids 1000 MG capsule Take 1 g by mouth daily.   fluticasone 50 MCG/ACT nasal spray Commonly known as: FLONASE Place 1 spray into both nostrils 2 (two) times daily as needed for allergies or rhinitis.   Glyxambi 25-5 MG  Tabs Generic drug: Empagliflozin-linaGLIPtin Take 1 tablet by mouth daily.   guaiFENesin 600 MG 12 hr tablet Commonly known as: Mucinex Take 1 tablet (600 mg total) by mouth 2 (two) times daily.   multivitamin tablet Take 1 tablet by mouth daily.   Oxcarbazepine 300 MG tablet Commonly known as: TRILEPTAL Take 1 tablet (300 mg total) by mouth 2 (two) times daily. Started by: Elige Radon Yerick Eggebrecht   pravastatin 40 MG tablet Commonly known as: PRAVACHOL Take 1 tablet (40 mg total) by mouth daily.   Toujeo SoloStar 300 UNIT/ML Solostar Pen Generic drug: insulin glargine (1 Unit  Dial) Inject 10 Units into the skin at bedtime.   UltiCare Mini Pen Needles 31G X 6 MM Misc Generic drug: Insulin Pen Needle USE AS DIRECTED AT BEDTIME         Objective:   BP 136/62   Pulse 78   Ht 6\' 1"  (1.854 m)   Wt 144 lb (65.3 kg)   SpO2 100%   BMI 19.00 kg/m   Wt Readings from Last 3 Encounters:  07/05/23 144 lb (65.3 kg)  04/03/23 144 lb (65.3 kg)  01/29/23 144 lb 12.8 oz (65.7 kg)    Physical Exam Vitals and nursing note reviewed.  Constitutional:      General: He is not in acute distress.    Appearance: He is well-developed. He is not diaphoretic.  Eyes:     General: No scleral icterus.    Conjunctiva/sclera: Conjunctivae normal.  Neck:     Thyroid: No thyromegaly.  Cardiovascular:     Rate and Rhythm: Normal rate and regular rhythm.     Heart sounds: Normal heart sounds. No murmur heard. Pulmonary:     Effort: Pulmonary effort is normal. No respiratory distress.     Breath sounds: Normal breath sounds. No wheezing.  Musculoskeletal:        General: No swelling. Normal range of motion.     Cervical back: Neck supple.  Lymphadenopathy:     Cervical: No cervical adenopathy.  Skin:    General: Skin is warm and dry.     Findings: No rash.  Neurological:     Mental Status: He is alert and oriented to person, place, and time.     Coordination: Coordination normal.  Psychiatric:        Behavior: Behavior normal.     Results for orders placed or performed in visit on 06/27/23  Bayer DCA Hb A1c Waived   Collection Time: 06/27/23  8:38 AM  Result Value Ref Range   HB A1C (BAYER DCA - WAIVED) 7.6 (H) 4.8 - 5.6 %  CBC with Differential/Platelet   Collection Time: 06/27/23  8:40 AM  Result Value Ref Range   WBC 5.9 3.4 - 10.8 x10E3/uL   RBC 4.42 4.14 - 5.80 x10E6/uL   Hemoglobin 12.7 (L) 13.0 - 17.7 g/dL   Hematocrit 16.1 09.6 - 51.0 %   MCV 88 79 - 97 fL   MCH 28.7 26.6 - 33.0 pg   MCHC 32.6 31.5 - 35.7 g/dL   RDW 04.5 40.9 - 81.1 %   Platelets  194 150 - 450 x10E3/uL   Neutrophils 61 Not Estab. %   Lymphs 26 Not Estab. %   Monocytes 10 Not Estab. %   Eos 2 Not Estab. %   Basos 1 Not Estab. %   Neutrophils Absolute 3.6 1.4 - 7.0 x10E3/uL   Lymphocytes Absolute 1.5 0.7 - 3.1 x10E3/uL   Monocytes Absolute 0.6 0.1 -  0.9 x10E3/uL   EOS (ABSOLUTE) 0.1 0.0 - 0.4 x10E3/uL   Basophils Absolute 0.0 0.0 - 0.2 x10E3/uL   Immature Granulocytes 0 Not Estab. %   Immature Grans (Abs) 0.0 0.0 - 0.1 x10E3/uL  CMP14+EGFR   Collection Time: 06/27/23  8:40 AM  Result Value Ref Range   Glucose 103 (H) 70 - 99 mg/dL   BUN 15 8 - 27 mg/dL   Creatinine, Ser 1.61 0.76 - 1.27 mg/dL   eGFR 74 >09 UE/AVW/0.98   BUN/Creatinine Ratio 14 10 - 24   Sodium 135 134 - 144 mmol/L   Potassium 5.2 3.5 - 5.2 mmol/L   Chloride 99 96 - 106 mmol/L   CO2 23 20 - 29 mmol/L   Calcium 9.4 8.6 - 10.2 mg/dL   Total Protein 6.7 6.0 - 8.5 g/dL   Albumin 4.3 3.8 - 4.8 g/dL   Globulin, Total 2.4 1.5 - 4.5 g/dL   Bilirubin Total 0.3 0.0 - 1.2 mg/dL   Alkaline Phosphatase 70 44 - 121 IU/L   AST 18 0 - 40 IU/L   ALT 17 0 - 44 IU/L  Lipid panel   Collection Time: 06/27/23  8:40 AM  Result Value Ref Range   Cholesterol, Total 139 100 - 199 mg/dL   Triglycerides 64 0 - 149 mg/dL   HDL 49 >11 mg/dL   VLDL Cholesterol Cal 13 5 - 40 mg/dL   LDL Chol Calc (NIH) 77 0 - 99 mg/dL   Chol/HDL Ratio 2.8 0.0 - 5.0 ratio  PSA, total and free   Collection Time: 06/27/23  8:40 AM  Result Value Ref Range   Prostate Specific Ag, Serum 3.3 0.0 - 4.0 ng/mL   PSA, Free 0.51 N/A ng/mL   PSA, Free Pct 15.5 %    Assessment & Plan:   Problem List Items Addressed This Visit       Cardiovascular and Mediastinum   Essential hypertension   Relevant Medications   pravastatin (PRAVACHOL) 40 MG tablet     Endocrine   Type 2 diabetes mellitus with other specified complication (HCC) - Primary   Relevant Medications   insulin glargine, 1 Unit Dial, (TOUJEO SOLOSTAR) 300 UNIT/ML  Solostar Pen   GLYXAMBI 25-5 MG TABS   pravastatin (PRAVACHOL) 40 MG tablet   Other Relevant Orders   Bayer DCA Hb A1c Waived   Microalbumin / creatinine urine ratio   Bayer DCA Hb A1c Waived   Diabetes mellitus treated with oral medication (HCC)   Relevant Medications   insulin glargine, 1 Unit Dial, (TOUJEO SOLOSTAR) 300 UNIT/ML Solostar Pen   GLYXAMBI 25-5 MG TABS   pravastatin (PRAVACHOL) 40 MG tablet     Nervous and Auditory   Trigeminal neuralgia   Relevant Medications   Oxcarbazepine (TRILEPTAL) 300 MG tablet     Other   Mixed hyperlipidemia   Relevant Medications   pravastatin (PRAVACHOL) 40 MG tablet    A1c is still elevated, is really going to focus on getting back to exercising and walking through the day.  Discussed we possibly need to go to a medication such as Ozempic in the future.  The only challenge with that is that he is already low on weight and do not want him to lose weight.  Cannot go up on long-acting insulin.  The other option is we could do short acting insulin when he eats. Follow up plan: Return in about 3 months (around 10/05/2023), or if symptoms worsen or fail to improve, for Diabetes  and hypertension and cholesterol.  Counseling provided for all of the vaccine components Orders Placed This Encounter  Procedures   Microalbumin / creatinine urine ratio   Bayer DCA Hb A1c Waived    Arville Care, MD [facility] 07/05/2023, 8:42 AM

## 2023-08-29 ENCOUNTER — Other Ambulatory Visit: Payer: Self-pay | Admitting: Family Medicine

## 2023-08-29 DIAGNOSIS — G500 Trigeminal neuralgia: Secondary | ICD-10-CM

## 2023-09-04 ENCOUNTER — Telehealth: Payer: Self-pay

## 2023-09-04 NOTE — Telephone Encounter (Unsigned)
 Copied from CRM [id number]. Topic: Appointments - Appointment Scheduling >> Sep 04, 2023  9:40 AM Sophia H wrote: Pt wife called in, said husband never received a phone call for AWV.  Can call her cell # [phone], Decl resch at this time

## 2023-09-04 NOTE — Progress Notes (Signed)
 This encounter was created in error - please disregard.

## 2023-09-04 NOTE — Telephone Encounter (Signed)
 Appt scheduled

## 2023-09-18 ENCOUNTER — Other Ambulatory Visit: Payer: Self-pay | Admitting: Family Medicine

## 2023-10-03 NOTE — Progress Notes (Signed)
 Assessment/Plan:    1.  Essential Tremor   -Status post DBS surgery.  IPG changed March 22, 2016, and November 01, 2020.  Battery checked today and looks good.  2.  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  -had V2/V3 rhizotomy with Dr. Nigel Bart 10/2019  -had repeat V2/V3 rhizotomy with Dr. Nigel Bart October, 2021.    - Patient was completely off of Lyrica  and carbamazepine , but went back on oxcarbazepine  in March, 2025.  He is only taking 1 every other day and seems to be doing well right now.  - He saw Dr. Nat Badger for this in February, 2025.  He was told that they do not do the injections that are needed, now the symptoms are getting worse.  He was told that he needed to go to [place].  I am not sure if the [facility] neurologist do this, but as above seems to be doing well with very low-dose Trileptal .   Subjective:   John Reese was seen today in follow up for essential tremor.  My previous records were reviewed prior to todays visit.  Patient with wife who supplements history.  Patient doing well in terms of tremor per pt but wife states that if he is using a screw, she will note tremor.   He did see Dr. Nat Badger February, 2025 in regards to his history of trigeminal neuralgia.  He noted occasional pain along the right cheek.  While I can see a history of that visit, I do not see any assessment/plan.  Patient did call me about a month after that visit and asked about a referral elsewhere, stating that Dr. Nat Badger does not do the nerve blockage necessary and that they would have to go to [place].  He wanted a referral closer.  I told him I was not sure if anyone closer did it, but told them we could check with the neurosurgeons in [place], but it may be best if that referral comes from the local neurosurgery practice, since they have the history of what was previously done and I do not have any of those records.   The patient reports that he did not get that referral.  He did see his primary care physician on March 14 and asked for oxcarbamazepine because his trigeminal neuralgia was getting worse.  He was started on that and it was written for 300 mg bid.  I take one every other day.  Its working well.  Current prescribed movement disorder medications: None (weaned off)  Prior meds: Carbamazepine ; Lyrica  (myoclonus with higher dosages)   ALLERGIES:   No Known Allergies   CURRENT MEDICATIONS:  Outpatient Encounter Medications as of 10/07/2023  Medication Sig   aspirin EC 81 MG tablet Take 81 mg by mouth daily.   CINNAMON  PO Take 1,000 mg by mouth daily.   diphenhydrAMINE  (BENADRYL  ALLERGY) 25 mg capsule Take 1 capsule (25 mg total) by mouth every 8 (eight) hours as needed (congestion).   fish oil-omega-3 fatty acids  1000 MG capsule Take 1 g by mouth daily.   fluticasone  (FLONASE ) 50 MCG/ACT nasal spray Place 1 spray into both nostrils 2 (two) times daily as needed for allergies or rhinitis.   GLYXAMBI  25-5 MG TABS Take 1 tablet by mouth daily.   guaiFENesin  (MUCINEX ) 600 MG 12 hr tablet Take 1 tablet (600 mg total) by mouth 2 (two) times daily.   insulin  glargine, 1 Unit Dial , (TOUJEO  SOLOSTAR) 300 UNIT/ML  Solostar Pen Inject 10 Units into the skin at bedtime.   Insulin  Pen Needle (BD PEN NEEDLE MINI ULTRAFINE) 31G X 5 MM MISC UAD at bedtime Dx E11.9   Multiple Vitamin (MULTIVITAMIN) tablet Take 1 tablet by mouth daily.   Oxcarbazepine  (TRILEPTAL ) 300 MG tablet TAKE 1 TABLET BY MOUTH 2 TIMES DAILY.   pravastatin  (PRAVACHOL ) 40 MG tablet Take 1 tablet (40 mg total) by mouth daily.   No facility-administered encounter medications on file as of 10/07/2023.     Objective:    PHYSICAL EXAMINATION:    VITALS:   Vitals:   10/07/23 1009  BP: 118/62  Pulse: 74  SpO2: 99%  Weight: 146 lb 12.8 oz (66.6 kg)  Height: 6' 1 (1.854 m)    Wt Readings from Last 3 Encounters:  10/07/23 146 lb  12.8 oz (66.6 kg)  07/05/23 144 lb (65.3 kg)  04/03/23 144 lb (65.3 kg)   GEN:  The patient appears stated age and is in NAD. HEENT:  Normocephalic, atraumatic.  The mucous membranes are moist. The superficial temporal arteries are without ropiness or tenderness. Cardiovascular: Regular rate rhythm Lungs: Clear to auscultation bilaterally Neck: No bruit   Neurological examination:  Orientation: The patient is alert and oriented x3. Cranial nerves: There is good facial symmetry. The speech is fluent and clear. Soft palate rises symmetrically and there is no tongue deviation. Hearing is intact to conversational tone. Sensation: Sensation is intact to light touch throughout Motor: Strength is at least antigravity x4.  Movement examination: Tone: There is normal tone in the UE/LE Abnormal movements: none.  No rest tremor.  No postural tremor.   Coordination:  There is no decremation with RAM's Gait and Station: The patient has no difficulty arising out of a deep-seated chair without the use of the hands. The patient's stride length is good I have reviewed and interpreted the following labs independently   Chemistry      Component Value Date/Time   NA 135 06/27/2023 0840   K 5.2 06/27/2023 0840   CL 99 06/27/2023 0840   CO2 23 06/27/2023 0840   BUN 15 06/27/2023 0840   CREATININE 1.05 06/27/2023 0840   CREATININE 0.96 10/06/2019 0925      Component Value Date/Time   CALCIUM 9.4 06/27/2023 0840   ALKPHOS 70 06/27/2023 0840   AST 18 06/27/2023 0840   ALT 17 06/27/2023 0840   BILITOT 0.3 06/27/2023 0840      Lab Results  Component Value Date   WBC 5.9 06/27/2023   HGB 12.7 (L) 06/27/2023   HCT 39.0 06/27/2023   MCV 88 06/27/2023   PLT 194 06/27/2023   Lab Results  Component Value Date   TSH 1.39 10/06/2019     Chemistry      Component Value Date/Time   NA 135 06/27/2023 0840   K 5.2 06/27/2023 0840   CL 99 06/27/2023 0840   CO2 23 06/27/2023 0840   BUN 15  06/27/2023 0840   CREATININE 1.05 06/27/2023 0840   CREATININE 0.96 10/06/2019 0925      Component Value Date/Time   CALCIUM 9.4 06/27/2023 0840   ALKPHOS 70 06/27/2023 0840   AST 18 06/27/2023 0840   ALT 17 06/27/2023 0840   BILITOT 0.3 06/27/2023 0840      Total time spent on today's visit was 25 minutes, including both face-to-face time and nonface-to-face time.  Time included that spent on review of records (prior notes available to me/labs/imaging if pertinent), discussing treatment  and goals, answering patient's questions and coordinating care.  This was independent of dbs  Cc:  [name], Lucio Sabin, MD

## 2023-10-07 ENCOUNTER — Ambulatory Visit: Admitting: Neurology

## 2023-10-07 ENCOUNTER — Encounter: Payer: Self-pay | Admitting: Neurology

## 2023-10-07 VITALS — BP 118/62 | HR 74 | Ht 73.0 in | Wt 146.8 lb

## 2023-10-07 DIAGNOSIS — G250 Essential tremor: Secondary | ICD-10-CM | POA: Diagnosis not present

## 2023-10-07 DIAGNOSIS — Z9689 Presence of other specified functional implants: Secondary | ICD-10-CM | POA: Diagnosis not present

## 2023-10-07 DIAGNOSIS — G500 Trigeminal neuralgia: Secondary | ICD-10-CM | POA: Diagnosis not present

## 2023-10-07 NOTE — Procedures (Signed)
 DBS Programming was performed.    Manufacturer of DBS device: [employer]tronic  Total time spent programming was 10 minutes.  Device was confirmed to be on.  Soft start was confirmed to be on.  Impedences were checked and were within normal limits.  Battery was checked and was determined to be functioning normally and not near end of life  Final settings were as follows:   Active Contact Amplitude (V) PW (ms) Frequency (hz) Side Effects Battery  Left Brain 1-2+ 2.8 90 150  2.85  02/02/20        10/04/20 1-2+ 2.8 90 150  2.75  04/12/21 1-2+ 2.8 90 150  2.97  04/24/22 1-2+ 3.0 90 150  2.94  01/29/23 1-2+ 3.1 90 150  2.90  10/07/23 1-2+ 3.2 90 150  2.85          Right Brain        02/02/20 1-2+ 2.7 90 150  2.83  10/04/20 1-2+ 2.7 90 150  2.58  04/12/21 1-2+ 2.7 90 150  2.97  04/24/22 1-2+ 2.7 90 150  2.95  01/29/23 1-2+ 2.7 90 150  2.92  10/07/23 1-2+ 2.7 90 150  2.88

## 2023-10-22 ENCOUNTER — Other Ambulatory Visit: Payer: Self-pay | Admitting: Family Medicine

## 2023-10-22 DIAGNOSIS — G500 Trigeminal neuralgia: Secondary | ICD-10-CM

## 2023-10-28 ENCOUNTER — Ambulatory Visit: Admitting: Family Medicine

## 2023-10-28 ENCOUNTER — Encounter: Payer: Self-pay | Admitting: Family Medicine

## 2023-10-28 VITALS — BP 138/70 | HR 75 | Ht 73.0 in | Wt 146.0 lb

## 2023-10-28 DIAGNOSIS — I10 Essential (primary) hypertension: Secondary | ICD-10-CM | POA: Diagnosis not present

## 2023-10-28 DIAGNOSIS — E1169 Type 2 diabetes mellitus with other specified complication: Secondary | ICD-10-CM

## 2023-10-28 DIAGNOSIS — E782 Mixed hyperlipidemia: Secondary | ICD-10-CM

## 2023-10-28 DIAGNOSIS — H6122 Impacted cerumen, left ear: Secondary | ICD-10-CM

## 2023-10-28 LAB — BAYER DCA HB A1C WAIVED: HB A1C (BAYER DCA - WAIVED): 7.6 % — ABNORMAL HIGH (ref 4.8–5.6)

## 2023-10-28 NOTE — Progress Notes (Signed)
 BP 138/70   Pulse 75   Ht 6' 1 (1.854 m)   Wt 146 lb (66.2 kg)   SpO2 100%   BMI 19.26 kg/m    Subjective:   Patient ID: John Reese Bud, male    DOB: 11-Aug-1950, 73 y.o.   MRN: [id number]  HPI: John Reese is a 73 y.o. male presenting on 10/28/2023 for Medical Management of Chronic Issues, Diabetes, and Hypertension   HPI Type 2 diabetes mellitus Patient comes in today for recheck of his diabetes. Patient has been currently taking Glyxambi  and Toujeo . Patient is not currently on an ACE inhibitor/ARB. Patient has seen an ophthalmologist this year. Patient denies any new issues with their feet. The symptom started onset as an adult hypertension and hyperlipidemia ARE RELATED TO DM jadh   Hyperlipidemia Patient is coming in for recheck of his hyperlipidemia. The patient is currently taking fish oils and pravastatin . They deny any issues with myalgias or history of liver damage from it. They deny any focal numbness or weakness or chest pain.   Hypertension Patient is currently on no medication, diet control, and their blood pressure today is 138/70. Patient denies any lightheadedness or dizziness. Patient denies headaches, blurred vision, chest pains, shortness of breath, or weakness. Denies any side effects from medication and is content with current medication.   Patient has hearing aids and feels like his left ear has been stopped up and somewhat hurting and feels like he has some wax jammed in there.  He has had this issue before and has been building up over the past month or so.  He denies any fevers or chills or drainage.  Relevant past medical, surgical, family and social history reviewed and updated as indicated. Interim medical history since our last visit reviewed. Allergies and medications reviewed and updated.  Review of Systems  Constitutional:  Negative for chills and fever.  Eyes:  Negative for visual disturbance.  Respiratory:  Negative for shortness of breath and  wheezing.   Cardiovascular:  Negative for chest pain and leg swelling.  Musculoskeletal:  Negative for back pain and gait problem.  Skin:  Negative for rash.  Neurological:  Negative for dizziness and light-headedness.  All other systems reviewed and are negative.   Per HPI unless specifically indicated above   Allergies as of 10/28/2023   No Known Allergies      Medication List        Accurate as of October 28, 2023  8:07 AM. If you have any questions, ask your nurse or doctor.          STOP taking these medications    diphenhydrAMINE  25 mg capsule Commonly known as: Benadryl  Allergy Stopped by: [facility] LABOR Natarsha Hurwitz   fluticasone  50 MCG/ACT nasal spray Commonly known as: FLONASE  Stopped by: [facility] LABOR Aune Adami   guaiFENesin  600 MG 12 hr tablet Commonly known as: Mucinex  Stopped by: [facility] LABOR Shaquia Berkley       TAKE these medications    aspirin EC 81 MG tablet Take 81 mg by mouth daily.   BD Pen Needle Mini Ultrafine 31G X 5 MM Misc Generic drug: Insulin  Pen Needle UAD at bedtime Dx E11.9   CINNAMON  PO Take 1,000 mg by mouth daily.   fish oil-omega-3 fatty acids  1000 MG capsule Take 1 g by mouth daily.   Glyxambi  25-5 MG Tabs Generic drug: Empagliflozin -linaGLIPtin Take 1 tablet by mouth daily.   multivitamin tablet Take 1 tablet by mouth daily.   Oxcarbazepine  300  MG tablet Commonly known as: TRILEPTAL  TAKE 1 TABLET BY MOUTH 2 TIMES DAILY.   pravastatin  40 MG tablet Commonly known as: PRAVACHOL  Take 1 tablet (40 mg total) by mouth daily.   Toujeo  SoloStar 300 UNIT/ML Solostar Pen Generic drug: insulin  glargine (1 Unit Dial ) Inject 10 Units into the skin at bedtime.         Objective:   BP 138/70   Pulse 75   Ht 6' 1 (1.854 m)   Wt 146 lb (66.2 kg)   SpO2 100%   BMI 19.26 kg/m   Wt Readings from Last 3 Encounters:  10/28/23 146 lb (66.2 kg)  10/07/23 146 lb 12.8 oz (66.6 kg)  07/05/23 144 lb (65.3 kg)    Physical Exam Vitals and  nursing note reviewed.  Constitutional:      General: He is not in acute distress.    Appearance: He is well-developed. He is not diaphoretic.  HENT:     Left Ear: There is impacted cerumen.  Eyes:     General: No scleral icterus.    Conjunctiva/sclera: Conjunctivae normal.  Neck:     Thyroid : No thyromegaly.  Cardiovascular:     Rate and Rhythm: Normal rate and regular rhythm.     Heart sounds: Normal heart sounds. No murmur heard. Pulmonary:     Effort: Pulmonary effort is normal. No respiratory distress.     Breath sounds: Normal breath sounds. No wheezing.  Musculoskeletal:        General: No swelling.     Cervical back: Neck supple.  Lymphadenopathy:     Cervical: No cervical adenopathy.  Skin:    General: Skin is warm and dry.     Findings: No rash.  Neurological:     Mental Status: He is alert and oriented to person, place, and time.     Coordination: Coordination normal.  Psychiatric:        Behavior: Behavior normal.     Results for orders placed or performed in visit on 07/05/23  HM DIABETES EYE EXAM   Collection Time: 05/21/23  2:30 PM  Result Value Ref Range   HM Diabetic Eye Exam No Retinopathy No Retinopathy   Cerumen lavage: Nurse to block mild cerumen.  Patient tolerated well. Assessment & Plan:   Problem List Items Addressed This Visit       Cardiovascular and Mediastinum   Essential hypertension     Endocrine   Type 2 diabetes mellitus with other specified complication (HCC) - Primary   Relevant Orders   Microalbumin/Creatinine Ratio, Urine   Bayer DCA Hb A1c Waived     Other   Mixed hyperlipidemia   Other Visit Diagnoses       Hearing loss due to cerumen impaction, left           Will get A1c on the way out.  Blood pressure and everything else looks good.  No changes Follow up plan: Return in about 3 months (around 01/28/2024), or if symptoms worsen or fail to improve, for Diabetes recheck.  Counseling provided for all of the  vaccine components Orders Placed This Encounter  Procedures   Microalbumin/Creatinine Ratio, Urine   Bayer DCA Hb A1c Waived    Fonda Levins, MD [facility] 10/28/2023, 8:07 AM

## 2023-10-29 ENCOUNTER — Encounter: Payer: PPO | Admitting: Neurology

## 2023-10-29 LAB — MICROALBUMIN / CREATININE URINE RATIO
Creatinine, Urine: 43.2 mg/dL
Microalb/Creat Ratio: <7 mg/g{creat} (ref 0–29)
Microalbumin, Urine: <3 ug/mL

## 2023-10-30 ENCOUNTER — Ambulatory Visit: Payer: Self-pay | Admitting: Family Medicine

## 2023-11-17 ENCOUNTER — Other Ambulatory Visit: Payer: Self-pay | Admitting: Family Medicine

## 2023-11-17 DIAGNOSIS — G500 Trigeminal neuralgia: Secondary | ICD-10-CM

## 2023-11-22 ENCOUNTER — Ambulatory Visit: Admitting: Family Medicine

## 2023-11-22 ENCOUNTER — Encounter: Payer: Self-pay | Admitting: Family Medicine

## 2023-11-22 ENCOUNTER — Ambulatory Visit (INDEPENDENT_AMBULATORY_CARE_PROVIDER_SITE_OTHER)

## 2023-11-22 VITALS — BP 130/66 | HR 78 | Ht 73.0 in | Wt 143.0 lb

## 2023-11-22 DIAGNOSIS — M25551 Pain in right hip: Secondary | ICD-10-CM

## 2023-11-22 DIAGNOSIS — M25552 Pain in left hip: Secondary | ICD-10-CM

## 2023-11-22 DIAGNOSIS — M160 Bilateral primary osteoarthritis of hip: Secondary | ICD-10-CM | POA: Diagnosis not present

## 2023-11-22 MED ORDER — METHYLPREDNISOLONE ACETATE 80 MG/ML IJ SUSP
80.0000 mg | Freq: Once | INTRAMUSCULAR | Status: AC
  Administered 2023-11-22: 80 mg via INTRAMUSCULAR

## 2023-11-22 NOTE — Progress Notes (Signed)
 BP 130/66   Pulse 78   Ht 6' 1 (1.854 m)   Wt 143 lb (64.9 kg)   SpO2 98%   BMI 18.87 kg/m    Subjective:   Patient ID: John Reese, male    DOB: 05/13/1950, 73 y.o.   MRN: [id number]  HPI: John Reese is a 73 y.o. male presenting on 11/22/2023 for Back Pain and Hip Pain   HPI Patient is coming in with bilateral hip pain, it hurts around the sides of his hip, especially when he tries to lay down at night it hurts on the outside when he lays on that side.  He also has a little bit of lower back pain that hurts when bending over but that is not bending the majority of his pain or the worst of his pain recently.  He also had some pain anteriorly when he moving his legs on those hips.  He says he feels a little weaker and off balance in those legs as well.  He denies any recent falls or trauma.  He says it has been bothering him more for about the past month he has had to go sleep in a chair some because of the pain.  He has been using a rolling pin on both of his outside hips and stretches as instructed previously when he had this issue.  Relevant past medical, surgical, family and social history reviewed and updated as indicated. Interim medical history since our last visit reviewed. Allergies and medications reviewed and updated.  Review of Systems  Constitutional:  Negative for chills and fever.  Eyes:  Negative for visual disturbance.  Respiratory:  Negative for shortness of breath and wheezing.   Cardiovascular:  Negative for chest pain and leg swelling.  Musculoskeletal:  Positive for arthralgias, back pain, gait problem and myalgias.  Skin:  Negative for rash.  Neurological:  Negative for dizziness and light-headedness.  All other systems reviewed and are negative.   Per HPI unless specifically indicated above   Allergies as of 11/22/2023   No Known Allergies      Medication List        Accurate as of November 22, 2023  8:26 AM. If you have any questions, ask your  nurse or doctor.          aspirin EC 81 MG tablet Take 81 mg by mouth daily.   BD Pen Needle Mini Ultrafine 31G X 5 MM Misc Generic drug: Insulin  Pen Needle UAD at bedtime Dx E11.9   CINNAMON  PO Take 1,000 mg by mouth daily.   fish oil-omega-3 fatty acids  1000 MG capsule Take 1 g by mouth daily.   Glyxambi  25-5 MG Tabs Generic drug: Empagliflozin -linaGLIPtin Take 1 tablet by mouth daily.   multivitamin tablet Take 1 tablet by mouth daily.   Oxcarbazepine  300 MG tablet Commonly known as: TRILEPTAL  TAKE 1 TABLET BY MOUTH 2 TIMES DAILY.   pravastatin  40 MG tablet Commonly known as: PRAVACHOL  Take 1 tablet (40 mg total) by mouth daily.   Toujeo  SoloStar 300 UNIT/ML Solostar Pen Generic drug: insulin  glargine (1 Unit Dial ) Inject 10 Units into the skin at bedtime.         Objective:   BP 130/66   Pulse 78   Ht 6' 1 (1.854 m)   Wt 143 lb (64.9 kg)   SpO2 98%   BMI 18.87 kg/m   Wt Readings from Last 3 Encounters:  11/22/23 143 lb (64.9 kg)  10/28/23 146 lb (  66.2 kg)  10/07/23 146 lb 12.8 oz (66.6 kg)    Physical Exam Vitals and nursing note reviewed.  Constitutional:      General: He is not in acute distress.    Appearance: He is well-developed. He is not diaphoretic.  Eyes:     General: No scleral icterus.    Conjunctiva/sclera: Conjunctivae normal.  Neck:     Thyroid : No thyromegaly.  Cardiovascular:     Rate and Rhythm: Normal rate and regular rhythm.     Heart sounds: Normal heart sounds. No murmur heard. Pulmonary:     Effort: Pulmonary effort is normal. No respiratory distress.     Breath sounds: Normal breath sounds. No wheezing.  Musculoskeletal:        General: Normal range of motion.     Cervical back: Neck supple.     Right hip: Tenderness and crepitus present. No deformity or bony tenderness. Normal range of motion. Normal strength.     Left hip: Tenderness and crepitus present. No deformity or bony tenderness. Normal range of motion.  Normal strength.  Lymphadenopathy:     Cervical: No cervical adenopathy.  Skin:    General: Skin is warm and dry.     Findings: No rash.  Neurological:     Mental Status: He is alert and oriented to person, place, and time.     Coordination: Coordination normal.  Psychiatric:        Behavior: Behavior normal.       Assessment & Plan:   Problem List Items Addressed This Visit   None Visit Diagnoses       Bilateral hip pain    -  Primary   Relevant Medications   methylPREDNISolone acetate (DEPO-MEDROL) injection 80 mg (Start on 11/22/2023  8:30 AM)   Other Relevant Orders   DG HIP UNILAT W OR W/O PELVIS 2-3 VIEWS LEFT   DG HIP UNILAT W OR W/O PELVIS 2-3 VIEWS RIGHT     Will give him Depo-Medrol intramuscular injection and do x-ray on the way out today.  X-ray technician is not here today so he is going to hang out and get the x-ray and leave from there.  Follow up plan: Return if symptoms worsen or fail to improve.  Counseling provided for all of the vaccine components Orders Placed This Encounter  Procedures   DG HIP UNILAT W OR W/O PELVIS 2-3 VIEWS LEFT   DG HIP UNILAT W OR W/O PELVIS 2-3 VIEWS RIGHT    Fonda Levins, MD [facility] 11/22/2023, 8:26 AM

## 2023-11-26 ENCOUNTER — Telehealth: Payer: Self-pay

## 2023-11-26 NOTE — Telephone Encounter (Signed)
 Copied from CRM #[id number]. Topic: Clinical - Lab/Test Results >> Nov 26, 2023  8:12 AM Larissa RAMAN wrote: Reason for CRM: Patient spouse requesting xray results.

## 2023-11-26 NOTE — Telephone Encounter (Signed)
 Wife made aware that results have not been reviewed by radiology and that we would call once Dr. Maryanne seen the results. Pt is in pain and wife just wants to know results ASAP. Explained that xray is delayed and it make take another week before scan is read.

## 2023-12-02 ENCOUNTER — Ambulatory Visit: Payer: Self-pay | Admitting: Family Medicine

## 2023-12-03 ENCOUNTER — Ambulatory Visit

## 2023-12-03 VITALS — BP 130/66 | HR 78 | Ht 73.0 in | Wt 143.0 lb

## 2023-12-03 DIAGNOSIS — Z0000 Encounter for general adult medical examination without abnormal findings: Secondary | ICD-10-CM

## 2023-12-03 NOTE — Patient Instructions (Addendum)
 John Reese , Thank you for taking time out of your busy schedule to complete your Annual Wellness Visit with me. I enjoyed our conversation and look forward to speaking with you again next year. I, as well as your care team,  appreciate your ongoing commitment to your health goals. Please review the following plan we discussed and let me know if I can assist you in the future. Your Game plan/ To Do List    Referrals: If you haven't heard from the office you've been referred to, please reach out to them at the phone provided.   Follow up Visits: We will see or speak with you next year for your Next Medicare AWV with our clinical staff on 12/03/24 at 8:00a.m. Have you seen your provider in the last 6 months (3 months if uncontrolled diabetes)? Yes  Clinician Recommendations:  Aim for 30 minutes of exercise or brisk walking, 6-8 glasses of water, and 5 servings of fruits and vegetables each day.       This is a list of the screenings recommended for you:  Health Maintenance  Topic Date Due   Hepatitis C Screening  Never done   COVID-19 Vaccine (4 - 2024-25 season) 12/23/2022   Medicare Annual Wellness Visit  09/03/2023   Flu Shot  11/22/2023   Colon Cancer Screening  01/24/2024   Complete foot exam   01/02/2024   Hemoglobin A1C  04/29/2024   Eye exam for diabetics  05/20/2024   Yearly kidney function blood test for diabetes  06/26/2024   Yearly kidney health urinalysis for diabetes  10/27/2024   DTaP/Tdap/Td vaccine (2 - Td or Tdap) 08/24/2027   Pneumococcal Vaccine for age over 60  Completed   Zoster (Shingles) Vaccine  Completed   Hepatitis B Vaccine  Aged Out   HPV Vaccine  Aged Out   Meningitis B Vaccine  Aged Out    Advanced directives: (Copy Requested) Please bring a copy of your health care power of attorney and living will to the office to be added to your chart at your convenience. You can mail to [facility] [address] [place], [place] [address] or email  to [email or web address] Advance Care Planning is important because it:  [x]  Makes sure you receive the medical care that is consistent with your values, goals, and preferences  [x]  It provides guidance to your family and loved ones and reduces their decisional burden about whether or not they are making the right decisions based on your wishes.  Follow the link provided in your after visit summary or read over the paperwork we have mailed to you to help you started getting your Advance Directives in place. If you need assistance in completing these, please reach out to us  so that we can help you!  See attachments for Preventive Care and Fall Prevention Tips.

## 2023-12-03 NOTE — Progress Notes (Addendum)
 Subjective:   John Reese is a 73 y.o. who presents for a Medicare Wellness preventive visit.  As a reminder, Annual Wellness Visits don't include a physical exam, and some assessments may be limited, especially if this visit is performed virtually. We may recommend an in-person follow-up visit with your provider if needed.  Visit Complete: Virtual I connected with  Willma LITTIE Alsip on 12/03/23 by a audio enabled telemedicine application and verified that I am speaking with the correct person using two identifiers.  Patient Location: Home  Provider Location: Home Office  I discussed the limitations of evaluation and management by telemedicine. The patient expressed understanding and agreed to proceed.  Vital Signs: Because this visit was a virtual/telehealth visit, some criteria may be missing or patient reported. Any vitals not documented were not able to be obtained and vitals that have been documented are patient reported.  VideoDeclined- This patient declined Librarian, academic. Therefore the visit was completed with audio only.  Persons Participating in Visit: Patient.  AWV Questionnaire: No: Patient Medicare AWV questionnaire was not completed prior to this visit.  Cardiac Risk Factors include: advanced age (>36men, >14 women);diabetes mellitus;dyslipidemia;hypertension;male gender     Objective:    Today's Vitals   12/03/23 0809 12/03/23 0821  BP:  130/66  Pulse:  78  Weight:  143 lb (64.9 kg)  Height:  6' 1 (1.854 m)  PainSc: 6     Body mass index is 18.87 kg/m.     12/03/2023    8:12 AM 10/07/2023   10:10 AM 01/29/2023   10:33 AM 09/03/2022    8:51 AM 04/24/2022   10:24 AM 08/31/2021   10:02 AM 04/12/2021    9:31 AM  Advanced Directives  Does Patient Have a Medical Advance Directive? Yes Yes Yes No Yes No Yes  Type of Advance Directive Living will Living will Living will  Living will  Living will  Would patient like information on  creating a medical advance directive?    No - Patient declined  No - Patient declined     Current Medications (verified) Outpatient Encounter Medications as of 12/03/2023  Medication Sig   aspirin EC 81 MG tablet Take 81 mg by mouth daily.   CINNAMON  PO Take 1,000 mg by mouth daily.   fish oil-omega-3 fatty acids  1000 MG capsule Take 1 g by mouth daily.   GLYXAMBI  25-5 MG TABS Take 1 tablet by mouth daily.   insulin  glargine, 1 Unit Dial , (TOUJEO  SOLOSTAR) 300 UNIT/ML Solostar Pen Inject 10 Units into the skin at bedtime.   Insulin  Pen Needle (BD PEN NEEDLE MINI ULTRAFINE) 31G X 5 MM MISC UAD at bedtime Dx E11.9   Multiple Vitamin (MULTIVITAMIN) tablet Take 1 tablet by mouth daily.   Oxcarbazepine  (TRILEPTAL ) 300 MG tablet TAKE 1 TABLET BY MOUTH 2 TIMES DAILY.   pravastatin  (PRAVACHOL ) 40 MG tablet Take 1 tablet (40 mg total) by mouth daily.   No facility-administered encounter medications on file as of 12/03/2023.    Allergies (verified) Patient has no known allergies.   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    Tip   Diabetes (HCC)    on meds   Dyslipidemia    Hyperlipidemia    on meds   Tremor    has a deep brain stimulator in place to treat -   Trigeminal  neuralgia    Right, V2 distribution   Past Surgical History:  Procedure Laterality Date   COLONOSCOPY  2021   FINGER AMPUTATION Right 1968   RIGHT pointer finger end   INNER EAR SURGERY Right    Ear drum repair   POLYPECTOMY  2021   TA's   PULSE GENERATOR IMPLANT Bilateral 03/30/2016   Procedure: BILATERAL PLACEMENT OF IMPLANTABLE PULSE GENERATOR;  Surgeon: Fairy Levels, MD;  Location: [facility] OR;  Service: Neurosurgery;  Laterality: Bilateral;  BILATERAL PLACEMENT OF IMPLANTABLE PULSE GENERATOR   SUBTHALAMIC STIMULATOR BATTERY REPLACEMENT N/A 11/01/2020   Procedure: Implantable pulse  generator battery change, Right and left chest;  Surgeon: Levels Fairy, MD;  Location: [facility] OR;  Service: Neurosurgery;  Laterality: N/A;  3C/RM 21   SUBTHALAMIC STIMULATOR INSERTION Bilateral 03/22/2016   Procedure: BILATERAL DEEP BRAIN STIMULATOR PLACEMENT WITH STARFIX WITH DR. EVONNIE;  Surgeon: Levels Fairy, MD;  Location: [facility];  Service: Neurosurgery;  Laterality: Bilateral;   Family History  Problem Relation Age of Onset   Colon cancer Mother 65   Stroke Mother    Colon polyps Mother 50   Stroke Father    Peripheral vascular disease Sister    Healthy Sister    Heart attack Brother    Healthy Brother    Healthy Brother    Healthy Brother    Healthy Brother    Healthy Daughter    Healthy Son    Esophageal cancer Neg Hx    Stomach cancer Neg Hx    Rectal cancer Neg Hx    Social History   Socioeconomic History   Marital status: Married    Spouse name: Not on file   Number of children: 2   Years of education: HS   Highest education level: 12th grade  Occupational History   Occupation: Works in Cytogeneticist: OTHER    Comment: RETIRED  Tobacco Use   Smoking status: Never   Smokeless tobacco: Never  Vaping Use   Vaping status: Never Used  Substance and Sexual Activity   Alcohol use: No   Drug use: No   Sexual activity: Not on file  Other Topics Concern   Not on file  Social History Narrative   Patient is right handed.   Patient drinks 2 cups coffee daily.   Social Drivers of Corporate investment banker Strain: Low Risk  (12/03/2023)   Overall Financial Resource Strain (CARDIA)    Difficulty of Paying Living Expenses: Not hard at all  Food Insecurity: No Food Insecurity (12/03/2023)   Hunger Vital Sign    Worried About Running Out of Food in the Last Year: Never true    Ran Out of Food in the Last Year: Never true  Transportation Needs: No Transportation Needs (12/03/2023)   PRAPARE - Administrator, Civil Service (Medical): No    Lack of  Transportation (Non-Medical): No  Physical Activity: Sufficiently Active (12/03/2023)   Exercise Vital Sign    Days of Exercise per Week: 7 days    Minutes of Exercise per Session: 30 min  Stress: No Stress Concern Present (12/03/2023)   [employer] of Occupational Health - Occupational Stress Questionnaire    Feeling of Stress: Not at all  Social Connections: Socially Integrated (09/03/2022)   Social Connection and Isolation Panel    Frequency of Communication with Friends and Family: More than three times a week    Frequency of Social Gatherings with Friends and Family: More than three  times a week    Attends Religious Services: More than 4 times per year    Active Member of Clubs or Organizations: Yes    Attends Engineer, structural: More than 4 times per year    Marital Status: Married    Tobacco Counseling Counseling given: Yes    Clinical Intake:  Pre-visit preparation completed: Yes  Pain : 0-10 (arthritis hip pain) Pain Score: 6  Pain Type: Chronic pain Pain Location: Hip Pain Orientation: Left Pain Descriptors / Indicators: Aching Pain Onset: Other (comment) Pain Frequency: Intermittent Pain Relieving Factors: Tylenol /and once pt gets moving the pain eases up per pt  Pain Relieving Factors: Tylenol /and once pt gets moving the pain eases up per pt  BMI - recorded: 18.87 Nutritional Status: BMI <19  Underweight Nutritional Risks: None Diabetes: Yes  Lab Results  Component Value Date   HGBA1C 7.6 (H) 10/28/2023   HGBA1C 7.6 (H) 06/27/2023   HGBA1C 7.6 (H) 04/03/2023     How often do you need to have someone help you when you read instructions, pamphlets, or other written materials from your doctor or pharmacy?: 1 - Never  Interpreter Needed?: No  Information entered by :: alia t/cma   Activities of Daily Living     12/03/2023    8:11 AM  In your present state of health, do you have any difficulty performing the following activities:   Hearing? 1  Vision? 0  Difficulty concentrating or making decisions? 0  Walking or climbing stairs? 0  Dressing or bathing? 0  Doing errands, shopping? 0  Preparing Food and eating ? N  Using the Toilet? N  In the past six months, have you accidently leaked urine? N  Do you have problems with loss of bowel control? N  Managing your Medications? N  Managing your Finances? N  Housekeeping or managing your Housekeeping? N    Patient Care Team: [name], Fonda LABOR, MD as PCP - General (Family Medicine) Jeffrie Oneil BROCKS, MD as PCP - Cardiology (Cardiology) [name], Asberry RAMAN, DO as Consulting Physician (Neurology) Billee Mliss BIRCH, [facility] as Pharmacist (Family Medicine) Nandigam, Kavitha V, MD as Consulting Physician (Gastroenterology) Unice Pac, MD as Consulting Physician (Neurosurgery)  I have updated your Care Teams any recent Medical Services you may have received from other providers in the past year.     Assessment:   This is a routine wellness examination for [employer].  Hearing/Vision screen Hearing Screening - Comments:: Pt wear hearing aids Vision Screening - Comments:: Pt wear glasses and denies vision dif/pt goes [facility] [place] 2/25   Goals Addressed             This Visit's Progress    Exercise 150 min/wk Moderate Activity   On track      Depression Screen     12/03/2023    8:15 AM 11/22/2023    8:07 AM 10/28/2023    7:58 AM 07/05/2023    8:18 AM 04/03/2023    8:20 AM 01/02/2023    9:43 AM 09/26/2022    8:18 AM  PHQ 2/9 Scores  PHQ - 2 Score 0 0 0 0 0 0 0  PHQ- 9 Score     0 0 0    Fall Risk     12/03/2023    8:08 AM 11/22/2023    8:06 AM 10/28/2023    7:58 AM 10/07/2023   10:10 AM 07/05/2023    8:18 AM  Fall Risk   Falls in the  past year? 0 0 0 0 0  Number falls in past yr: 0 0 0 0   Injury with Fall? 0 0 0 0   Risk for fall due to : No Fall Risks No Fall Risks No Fall Risks    Follow up Falls evaluation completed Falls evaluation completed Falls  evaluation completed Falls evaluation completed     MEDICARE RISK AT HOME:  Medicare Risk at Home Any stairs in or around the home?: Yes If so, are there any without handrails?: Yes Home free of loose throw rugs in walkways, pet beds, electrical cords, etc?: Yes Adequate lighting in your home to reduce risk of falls?: Yes Life alert?: No Use of a cane, walker or w/c?: No Grab bars in the bathroom?: Yes Shower chair or bench in shower?: Yes Elevated toilet seat or a handicapped toilet?: Yes  TIMED UP AND GO:  Was the test performed?  no  Cognitive Function: 6CIT completed        12/03/2023    8:13 AM 09/03/2022    8:51 AM 08/31/2021   10:06 AM 08/09/2020    8:16 AM  6CIT Screen  What Year? 0 points 0 points 0 points 0 points  What month? 0 points 0 points 0 points 0 points  What time? 0 points 0 points 0 points 0 points  Count back from 20 0 points 0 points 0 points 0 points  Months in reverse 0 points 0 points 0 points 0 points  Repeat phrase 0 points 0 points 0 points 2 points  Total Score 0 points 0 points 0 points 2 points    Immunizations Immunization History  Administered Date(s) Administered   Fluad Quad(high Dose 65+) 02/01/2016, 01/02/2017, 12/30/2018, 02/05/2020, 02/06/2021, 02/28/2022   Fluad Trivalent(High Dose 65+) 01/02/2023   Influenza, High Dose Seasonal PF 02/07/2015, 02/01/2016, 01/02/2017, 01/24/2018, 01/24/2018   Influenza-Unspecified 02/02/2014   PFIZER(Purple Top)SARS-COV-2 Vaccination 05/10/2019, 05/28/2019, 01/07/2020   Pneumococcal Conjugate-13 04/07/2015   Pneumococcal Polysaccharide-23 07/02/2016, 12/30/2018   Tdap 08/23/2017   Zoster Recombinant(Shingrix) 07/08/2020, 09/16/2020   Zoster, Live 12/02/2012    Screening Tests Health Maintenance  Topic Date Due   Hepatitis C Screening  Never done   COVID-19 Vaccine (4 - 2024-25 season) 12/23/2022   INFLUENZA VACCINE  11/22/2023   Colonoscopy  01/24/2024   FOOT EXAM  01/02/2024    HEMOGLOBIN A1C  04/29/2024   OPHTHALMOLOGY EXAM  05/20/2024   Diabetic kidney evaluation - eGFR measurement  06/26/2024   Diabetic kidney evaluation - Urine ACR  10/27/2024   Medicare Annual Wellness (AWV)  12/02/2024   DTaP/Tdap/Td (2 - Td or Tdap) 08/24/2027   Pneumococcal Vaccine: 50+ Years  Completed   Zoster Vaccines- Shingrix  Completed   Hepatitis B Vaccines  Aged Out   HPV VACCINES  Aged Out   Meningococcal B Vaccine  Aged Out    Health Maintenance  Health Maintenance Due  Topic Date Due   Hepatitis C Screening  Never done   COVID-19 Vaccine (4 - 2024-25 season) 12/23/2022   INFLUENZA VACCINE  11/22/2023   Colonoscopy  01/24/2024   Health Maintenance Items Addressed: See Nurse Notes at the end of this note  Additional Screening:  Vision Screening: Recommended annual ophthalmology exams for early detection of glaucoma and other disorders of the eye. Would you like a referral to an eye doctor? No    Dental Screening: Recommended annual dental exams for proper oral hygiene  Community Resource Referral / Chronic Care Management: CRR required this  visit?  No   CCM required this visit?  No   Plan:    I have personally reviewed and noted the following in the patient's chart:   Medical and social history Use of alcohol, tobacco or illicit drugs  Current medications and supplements including opioid prescriptions. Patient is not currently taking opioid prescriptions. Functional ability and status Nutritional status Physical activity Advanced directives List of other physicians Hospitalizations, surgeries, and ER visits in previous 12 months Vitals Screenings to include cognitive, depression, and falls Referrals and appointments  In addition, I have reviewed and discussed with patient certain preventive protocols, quality metrics, and best practice recommendations. A written personalized care plan for preventive services as well as general preventive health  recommendations were provided to patient.   Ozie Ned, CMA   12/03/2023   After Visit Summary: (MyChart) Due to this being a telephonic visit, the after visit summary with patients personalized plan was offered to patient via MyChart   Notes: PCP Follow Up Recommendations: Pt is aware and due for a colonoscopy, per pt will schedule appt in sept for Oct.

## 2023-12-15 ENCOUNTER — Other Ambulatory Visit: Payer: Self-pay | Admitting: Family Medicine

## 2023-12-15 DIAGNOSIS — G500 Trigeminal neuralgia: Secondary | ICD-10-CM

## 2023-12-25 DIAGNOSIS — H903 Sensorineural hearing loss, bilateral: Secondary | ICD-10-CM | POA: Diagnosis not present

## 2024-01-07 ENCOUNTER — Encounter: Payer: Self-pay | Admitting: Neurology

## 2024-01-20 ENCOUNTER — Telehealth: Payer: Self-pay | Admitting: Family Medicine

## 2024-01-20 DIAGNOSIS — I10 Essential (primary) hypertension: Secondary | ICD-10-CM

## 2024-01-20 DIAGNOSIS — E782 Mixed hyperlipidemia: Secondary | ICD-10-CM

## 2024-01-20 DIAGNOSIS — E1169 Type 2 diabetes mellitus with other specified complication: Secondary | ICD-10-CM

## 2024-01-20 NOTE — Telephone Encounter (Signed)
 Future orders placed

## 2024-01-20 NOTE — Telephone Encounter (Signed)
 Patient has a lab appt 9-30 for [name] and needs orders.

## 2024-01-21 ENCOUNTER — Other Ambulatory Visit

## 2024-01-21 DIAGNOSIS — I10 Essential (primary) hypertension: Secondary | ICD-10-CM | POA: Diagnosis not present

## 2024-01-21 DIAGNOSIS — E1169 Type 2 diabetes mellitus with other specified complication: Secondary | ICD-10-CM

## 2024-01-21 DIAGNOSIS — E782 Mixed hyperlipidemia: Secondary | ICD-10-CM | POA: Diagnosis not present

## 2024-01-21 LAB — LIPID PANEL

## 2024-01-21 LAB — BAYER DCA HB A1C WAIVED: HB A1C (BAYER DCA - WAIVED): 7.6 % — ABNORMAL HIGH (ref 4.8–5.6)

## 2024-01-22 ENCOUNTER — Other Ambulatory Visit

## 2024-01-22 ENCOUNTER — Other Ambulatory Visit: Payer: Self-pay | Admitting: Family Medicine

## 2024-01-22 ENCOUNTER — Ambulatory Visit: Payer: Self-pay | Admitting: Family Medicine

## 2024-01-22 DIAGNOSIS — E875 Hyperkalemia: Secondary | ICD-10-CM

## 2024-01-22 LAB — CMP14+EGFR
ALT: 17 IU/L (ref 0–44)
AST: 17 IU/L (ref 0–40)
Albumin: 4.5 g/dL (ref 3.8–4.8)
Alkaline Phosphatase: 56 IU/L (ref 47–123)
BUN/Creatinine Ratio: 17 (ref 10–24)
BUN: 20 mg/dL (ref 8–27)
Bilirubin Total: 0.5 mg/dL (ref 0.0–1.2)
CO2: 23 mmol/L (ref 20–29)
Calcium: 10.4 mg/dL — AB (ref 8.6–10.2)
Chloride: 100 mmol/L (ref 96–106)
Creatinine, Ser: 1.17 mg/dL (ref 0.76–1.27)
Globulin, Total: 2.6 g/dL (ref 1.5–4.5)
Glucose: 116 mg/dL — AB (ref 70–99)
Potassium: 6 mmol/L — AB (ref 3.5–5.2)
Sodium: 137 mmol/L (ref 134–144)
Total Protein: 7.1 g/dL (ref 6.0–8.5)
eGFR: 65 mL/min/1.73 (ref >59)

## 2024-01-22 LAB — CBC WITH DIFFERENTIAL/PLATELET
Basophils Absolute: 0 x10E3/uL (ref 0.0–0.2)
Basos: 1 %
EOS (ABSOLUTE): 0.1 x10E3/uL (ref 0.0–0.4)
Eos: 3 %
Hematocrit: 44.4 % (ref 37.5–51.0)
Hemoglobin: 14 g/dL (ref 13.0–17.7)
Immature Grans (Abs): 0 x10E3/uL (ref 0.0–0.1)
Immature Granulocytes: 0 %
Lymphocytes Absolute: 1.9 x10E3/uL (ref 0.7–3.1)
Lymphs: 33 %
MCH: 29.2 pg (ref 26.6–33.0)
MCHC: 31.5 g/dL (ref 31.5–35.7)
MCV: 93 fL (ref 79–97)
Monocytes Absolute: 0.5 x10E3/uL (ref 0.1–0.9)
Monocytes: 8 %
Neutrophils Absolute: 3.2 x10E3/uL (ref 1.4–7.0)
Neutrophils: 55 %
Platelets: 173 x10E3/uL (ref 150–450)
RBC: 4.8 x10E6/uL (ref 4.14–5.80)
RDW: 12.9 % (ref 11.6–15.4)
WBC: 5.7 x10E3/uL (ref 3.4–10.8)

## 2024-01-22 LAB — LIPID PANEL
Cholesterol, Total: 150 mg/dL (ref 100–199)
HDL: 52 mg/dL (ref >39)
LDL CALC COMMENT:: 2.9 ratio (ref 0.0–5.0)
LDL Chol Calc (NIH): 86 mg/dL (ref 0–99)
Triglycerides: 58 mg/dL (ref 0–149)
VLDL Cholesterol Cal: 12 mg/dL (ref 5–40)

## 2024-01-22 NOTE — Progress Notes (Signed)
 Placed bmp order

## 2024-01-23 ENCOUNTER — Ambulatory Visit: Payer: Self-pay | Admitting: Family Medicine

## 2024-01-23 LAB — BASIC METABOLIC PANEL WITH GFR
BUN/Creatinine Ratio: 19 (ref 10–24)
BUN: 23 mg/dL (ref 8–27)
CO2: 23 mmol/L (ref 20–29)
Calcium: 9.5 mg/dL (ref 8.6–10.2)
Chloride: 98 mmol/L (ref 96–106)
Creatinine, Ser: 1.18 mg/dL (ref 0.76–1.27)
Glucose: 98 mg/dL (ref 70–99)
Potassium: 5.1 mmol/L (ref 3.5–5.2)
Sodium: 138 mmol/L (ref 134–144)
eGFR: 64 mL/min/1.73 (ref >59)

## 2024-01-29 ENCOUNTER — Encounter: Payer: Self-pay | Admitting: Family Medicine

## 2024-01-29 ENCOUNTER — Ambulatory Visit: Admitting: Family Medicine

## 2024-01-29 VITALS — BP 126/65 | HR 84 | Ht 73.0 in | Wt 145.0 lb

## 2024-01-29 DIAGNOSIS — R351 Nocturia: Secondary | ICD-10-CM | POA: Diagnosis not present

## 2024-01-29 DIAGNOSIS — I10 Essential (primary) hypertension: Secondary | ICD-10-CM

## 2024-01-29 DIAGNOSIS — Z794 Long term (current) use of insulin: Secondary | ICD-10-CM

## 2024-01-29 DIAGNOSIS — Z23 Encounter for immunization: Secondary | ICD-10-CM | POA: Diagnosis not present

## 2024-01-29 DIAGNOSIS — E782 Mixed hyperlipidemia: Secondary | ICD-10-CM

## 2024-01-29 DIAGNOSIS — N401 Enlarged prostate with lower urinary tract symptoms: Secondary | ICD-10-CM

## 2024-01-29 DIAGNOSIS — E1169 Type 2 diabetes mellitus with other specified complication: Secondary | ICD-10-CM | POA: Diagnosis not present

## 2024-01-29 MED ORDER — GLYXAMBI 25-5 MG PO TABS: 1.0000 | ORAL_TABLET | Freq: Every day | ORAL | 3 refills | Status: DC

## 2024-01-29 MED ORDER — TOUJEO SOLOSTAR 300 UNIT/ML ~~LOC~~ SOPN: 10.0000 [IU] | PEN_INJECTOR | Freq: Every day | SUBCUTANEOUS | 3 refills | Status: AC

## 2024-01-29 NOTE — Progress Notes (Unsigned)
 Chief Complaint: Discuss repeat colonoscopy  HPI:    John Reese is a 73 year old male with a past medical history as listed below including diabetes, trigeminal neuralgia, and multiple others, known to Dr. Shila, who was referred to me by [name], Fonda LABOR, MD for consideration of a repeat colonoscopy.    01/23/2021 colonoscopy done for personal history of colonic polyps, high risk colon cancer surveillance with a history of an adenoma 10 mm or greater in size and multiple 3 or more adenomas.  At that time three 3-6 mm polyps in the ascending colon and cecum as well as diverticulosis in the sigmoid colon nonbleeding external and internal hemorrhoids.  Pathology showed tubular adenomas.  Repeat recommended in 3 years.    01/21/2024 CBC normal.  CMP with elevated potassium at 6 and glucose of 116.    01/22/2024 BMP normal.  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    Tip   Diabetes (HCC)    on meds   Dyslipidemia    Hyperlipidemia    on meds   Tremor    has a deep brain stimulator in place to treat -   Trigeminal neuralgia    Right, V2 distribution    Past Surgical History:  Procedure Laterality Date   COLONOSCOPY  2021   FINGER AMPUTATION Right 1968   RIGHT pointer finger end   INNER EAR SURGERY Right    Ear drum repair   POLYPECTOMY  2021   TA's   PULSE GENERATOR IMPLANT Bilateral 03/30/2016   Procedure: BILATERAL PLACEMENT OF IMPLANTABLE PULSE GENERATOR;  Surgeon: Fairy Levels, MD;  Location: [facility] OR;  Service: Neurosurgery;  Laterality: Bilateral;  BILATERAL PLACEMENT OF IMPLANTABLE PULSE GENERATOR   SUBTHALAMIC STIMULATOR BATTERY REPLACEMENT N/A 11/01/2020   Procedure: Implantable pulse generator battery change, Right and left chest;  Surgeon: Levels Fairy, MD;  Location: [facility];  Service: Neurosurgery;  Laterality: N/A;  3C/RM 21   SUBTHALAMIC  STIMULATOR INSERTION Bilateral 03/22/2016   Procedure: BILATERAL DEEP BRAIN STIMULATOR PLACEMENT WITH STARFIX WITH DR. EVONNIE;  Surgeon: Levels Fairy, MD;  Location: [facility] OR;  Service: Neurosurgery;  Laterality: Bilateral;    Current Outpatient Medications  Medication Sig Dispense Refill   aspirin EC 81 MG tablet Take 81 mg by mouth daily.     CINNAMON  PO Take 1,000 mg by mouth daily.     fish oil-omega-3 fatty acids  1000 MG capsule Take 1 g by mouth daily.     GLYXAMBI  25-5 MG TABS Take 1 tablet by mouth daily. 90 tablet 3   insulin  glargine, 1 Unit Dial , (TOUJEO  SOLOSTAR) 300 UNIT/ML Solostar Pen Inject 10 Units into the skin at bedtime. 3 mL 3   Insulin  Pen Needle (BD PEN NEEDLE MINI ULTRAFINE) 31G X 5 MM MISC UAD at bedtime Dx E11.9 100 each 3   Multiple Vitamin (MULTIVITAMIN) tablet Take 1 tablet by mouth daily.     Oxcarbazepine  (TRILEPTAL ) 300 MG tablet TAKE 1 TABLET BY MOUTH 2 TIMES DAILY. 60 tablet 1   pravastatin  (PRAVACHOL ) 40 MG tablet Take 1 tablet (40 mg total) by mouth daily. 90 tablet 3   No current facility-administered medications for this visit.    Allergies as of 01/30/2024   (No Known Allergies)    Family History  Problem Relation Age of Onset   Colon cancer Mother 86   Stroke Mother    Colon polyps  Mother 38   Stroke Father    Peripheral vascular disease Sister    Healthy Sister    Heart attack Brother    Healthy Brother    Healthy Brother    Healthy Brother    Healthy Brother    Healthy Daughter    Healthy Son    Esophageal cancer Neg Hx    Stomach cancer Neg Hx    Rectal cancer Neg Hx     Social History   Socioeconomic History   Marital status: Married    Spouse name: Not on file   Number of children: 2   Years of education: HS   Highest education level: 12th grade  Occupational History   Occupation: Works in Cytogeneticist: [employer]    Comment: RETIRED  Tobacco Use   Smoking status: Never   Smokeless tobacco: Never  Vaping Use    Vaping status: Never Used  Substance and Sexual Activity   Alcohol use: No   Drug use: No   Sexual activity: Not on file  Other Topics Concern   Not on file  Social History Narrative   Patient is right handed.   Patient drinks 2 cups coffee daily.   Social Drivers of Corporate investment banker Strain: Low Risk  (12/03/2023)   Overall Financial Resource Strain (CARDIA)    Difficulty of Paying Living Expenses: Not hard at all  Food Insecurity: No Food Insecurity (12/03/2023)   Hunger Vital Sign    Worried About Running Out of Food in the Last Year: Never true    Ran Out of Food in the Last Year: Never true  Transportation Needs: No Transportation Needs (12/03/2023)   PRAPARE - Administrator, Civil Service (Medical): No    Lack of Transportation (Non-Medical): No  Physical Activity: Sufficiently Active (12/03/2023)   Exercise Vital Sign    Days of Exercise per Week: 7 days    Minutes of Exercise per Session: 30 min  Stress: No Stress Concern Present (12/03/2023)   [employer] of Occupational Health - Occupational Stress Questionnaire    Feeling of Stress: Not at all  Social Connections: Socially Integrated (09/03/2022)   Social Connection and Isolation Panel    Frequency of Communication with Friends and Family: More than three times a week    Frequency of Social Gatherings with Friends and Family: More than three times a week    Attends Religious Services: More than 4 times per year    Active Member of [employer] or Organizations: Yes    Attends Engineer, structural: More than 4 times per year    Marital Status: Married  Catering manager Violence: Not At Risk (12/03/2023)   Humiliation, Afraid, Rape, and Kick questionnaire    Fear of Current or Ex-Partner: No    Emotionally Abused: No    Physically Abused: No    Sexually Abused: No    Review of Systems:    Constitutional: No weight loss, fever, chills, weakness or fatigue HEENT: Eyes: No change in  vision               Ears, Nose, Throat:  No change in hearing or congestion Skin: No rash or itching Cardiovascular: No chest pain, chest pressure or palpitations   Respiratory: No SOB or cough Gastrointestinal: See HPI and otherwise negative Genitourinary: No dysuria or change in urinary frequency Neurological: No headache, dizziness or syncope Musculoskeletal: No new muscle or joint pain Hematologic: No bleeding or bruising  Psychiatric: No history of depression or anxiety    Physical Exam:  Vital signs: There were no vitals taken for this visit.  Constitutional:   Pleasant Caucasian male appears to be in NAD, Well developed, Well nourished, alert and cooperative Head:  Normocephalic and atraumatic. Eyes:   PEERL, EOMI. No icterus. Conjunctiva pink. Ears:  Normal auditory acuity. Neck:  Supple Throat: Oral cavity and pharynx without inflammation, swelling or lesion.  Respiratory: Respirations even and unlabored. Lungs clear to auscultation bilaterally.   No wheezes, crackles, or rhonchi.  Cardiovascular: Normal S1, S2. No MRG. Regular rate and rhythm. No peripheral edema, cyanosis or pallor.  Gastrointestinal:  Soft, nondistended, nontender. No rebound or guarding. Normal bowel sounds. No appreciable masses or hepatomegaly. Rectal:  Not performed.  Msk:  Symmetrical without gross deformities. Without edema, no deformity or joint abnormality.  Neurologic:  Alert and  oriented x4;  grossly normal neurologically.  Skin:   Dry and intact without significant lesions or rashes. Psychiatric: Oriented to person, place and time. Demonstrates good judgement and reason without abnormal affect or behaviors.  RELEVANT LABS AND IMAGING: CBC    Component Value Date/Time   WBC 5.7 01/21/2024 0824   WBC 4.8 11/01/2020 1249   RBC 4.80 01/21/2024 0824   RBC 4.62 11/01/2020 1249   HGB 14.0 01/21/2024 0824   HCT 44.4 01/21/2024 0824   PLT 173 01/21/2024 0824   MCV 93 01/21/2024 0824   MCH  29.2 01/21/2024 0824   MCH 28.8 11/01/2020 1249   MCHC 31.5 01/21/2024 0824   MCHC 31.4 11/01/2020 1249   RDW 12.9 01/21/2024 0824   LYMPHSABS 1.9 01/21/2024 0824   MONOABS 0.3 02/23/2014 1155   EOSABS 0.1 01/21/2024 0824   BASOSABS 0.0 01/21/2024 0824    CMP     Component Value Date/Time   NA 138 01/22/2024 1510   K 5.1 01/22/2024 1510   CL 98 01/22/2024 1510   CO2 23 01/22/2024 1510   GLUCOSE 98 01/22/2024 1510   GLUCOSE 100 (H) 11/01/2020 1249   BUN 23 01/22/2024 1510   CREATININE 1.18 01/22/2024 1510   CREATININE 0.96 10/06/2019 0925   CALCIUM 9.5 01/22/2024 1510   PROT 7.1 01/21/2024 0824   ALBUMIN 4.5 01/21/2024 0824   AST 17 01/21/2024 0824   ALT 17 01/21/2024 0824   ALKPHOS 56 01/21/2024 0824   BILITOT 0.5 01/21/2024 0824   GFRNONAA >60 11/01/2020 1249   GFRNONAA 80 10/06/2019 0925   GFRAA 92 10/06/2019 0925    Assessment: 1.  History of adenomatous polyps: Last colonoscopy in October 2022 with repeat recommended in 3 years given history of multiple adenomas on that exam as well as previous including a greater than 10 mm adenoma  Plan: 1. ***     Delon Failing, PA-C [facility] 01/29/2024, 3:51 PM  Cc: [name], Fonda LABOR, MD

## 2024-01-29 NOTE — Progress Notes (Signed)
 BP 126/65   Pulse 84   Ht 6' 1 (1.854 m)   Wt 145 lb (65.8 kg)   SpO2 98%   BMI 19.13 kg/m    Subjective:   Patient ID: John Reese, male    DOB: Apr 05, 1951, 73 y.o.   MRN: [id number]  HPI: John Reese is a 73 y.o. male presenting on 01/29/2024 for Medical Management of Chronic Issues, Diabetes, Hypertension, and Hyperlipidemia   Discussed the use of AI scribe software for clinical note transcription with the patient, who gave verbal consent to proceed.  History of Present Illness   John Reese is a 73 year old male with diabetes who presents for a recheck of his blood sugar levels.  Glycemic control - Persistent nocturnal hyperglycemia despite taking insulin  at bedtime - Bedtime is typically 10 PM; dinner at 5 PM - Blood sugar remains elevated at night regardless of snacking or walking two miles daily - Morning blood sugar ranges from 85 to 110 - Current diabetes medications: Glyxambi  and Toujeo  - Discrepancy in Glyxambi  prescription, receiving only six tablets - Takes 10 units of Toujeo  at bedtime - Recent hemoglobin A1c is 7.6, consistent with previous results  Nocturia and urinary symptoms - Frequent nocturnal urination, waking approximately five times per night to urinate - No difficulty initiating urination or emptying bladder completely - Urine flow is strong - Increased daytime urinary frequency after coffee consumption, attributed to diuretic effect  Peripheral neuropathy and foot care - No cuts or sores on feet - No swelling  Laboratory abnormalities - Previous potassium level was elevated but normalized on repeat testing  Lipid management - Takes pravastatin  and fish oils for cholesterol management          Relevant past medical, surgical, family and social history reviewed and updated as indicated. Interim medical history since our last visit reviewed. Allergies and medications reviewed and updated.  Review of Systems  Constitutional:   Negative for chills and fever.  HENT:  Negative for ear pain and tinnitus.   Eyes:  Negative for pain and discharge.  Respiratory:  Negative for cough, shortness of breath and wheezing.   Cardiovascular:  Negative for chest pain, palpitations and leg swelling.  Gastrointestinal:  Negative for abdominal pain, blood in stool, constipation and diarrhea.  Genitourinary:  Positive for frequency. Negative for dysuria and hematuria.  Musculoskeletal:  Negative for back pain, gait problem and myalgias.  Skin:  Negative for rash.  Neurological:  Negative for dizziness, weakness and headaches.  Psychiatric/Behavioral:  Negative for suicidal ideas.   All other systems reviewed and are negative.   Per HPI unless specifically indicated above   Allergies as of 01/29/2024   No Known Allergies      Medication List        Accurate as of January 29, 2024  8:33 AM. If you have any questions, ask your nurse or doctor.          aspirin EC 81 MG tablet Take 81 mg by mouth daily.   BD Pen Needle Mini Ultrafine 31G X 5 MM Misc Generic drug: Insulin  Pen Needle UAD at bedtime Dx E11.9   CINNAMON  PO Take 1,000 mg by mouth daily.   fish oil-omega-3 fatty acids  1000 MG capsule Take 1 g by mouth daily.   Glyxambi  25-5 MG Tabs Generic drug: Empagliflozin -linaGLIPtin Take 1 tablet by mouth daily.   multivitamin tablet Take 1 tablet by mouth daily.   Oxcarbazepine  300 MG tablet Commonly known as:  TRILEPTAL  TAKE 1 TABLET BY MOUTH 2 TIMES DAILY.   pravastatin  40 MG tablet Commonly known as: PRAVACHOL  Take 1 tablet (40 mg total) by mouth daily.   Toujeo  SoloStar 300 UNIT/ML Solostar Pen Generic drug: insulin  glargine (1 Unit Dial ) Inject 10 Units into the skin at bedtime.         Objective:   BP 126/65   Pulse 84   Ht 6' 1 (1.854 m)   Wt 145 lb (65.8 kg)   SpO2 98%   BMI 19.13 kg/m   Wt Readings from Last 3 Encounters:  01/29/24 145 lb (65.8 kg)  12/03/23 143 lb (64.9 kg)   11/22/23 143 lb (64.9 kg)    Physical Exam Vitals and nursing note reviewed.  Constitutional:      General: He is not in acute distress.    Appearance: He is well-developed. He is not diaphoretic.  Eyes:     General: No scleral icterus.    Conjunctiva/sclera: Conjunctivae normal.  Neck:     Thyroid : No thyromegaly.  Cardiovascular:     Rate and Rhythm: Normal rate and regular rhythm.     Heart sounds: Normal heart sounds. No murmur heard. Pulmonary:     Effort: Pulmonary effort is normal. No respiratory distress.     Breath sounds: Normal breath sounds. No wheezing.  Musculoskeletal:        General: Normal range of motion.     Cervical back: Neck supple.  Lymphadenopathy:     Cervical: No cervical adenopathy.  Skin:    General: Skin is warm and dry.     Findings: No rash.  Neurological:     Mental Status: He is alert and oriented to person, place, and time.     Coordination: Coordination normal.  Psychiatric:        Behavior: Behavior normal.       VITALS: BP- 126/65 NECK: Thyroid  normal, no lumps. CHEST: Lungs clear to auscultation bilaterally. CARDIOVASCULAR: Regular heart sounds, no murmurs. EXTREMITIES: Good pulses, no swelling in extremities.         Assessment & Plan:   Problem List Items Addressed This Visit       Cardiovascular and Mediastinum   Essential hypertension     Endocrine   Type 2 diabetes mellitus with other specified complication (HCC) - Primary   Relevant Medications   GLYXAMBI  25-5 MG TABS   insulin  glargine, 1 Unit Dial , (TOUJEO  SOLOSTAR) 300 UNIT/ML Solostar Pen     Other   Mixed hyperlipidemia   BPH associated with nocturia       Type 2 diabetes mellitus with poor glycemic control Blood glucose levels high at night despite insulin . Morning glucose 85-110 mg/dL. Hemoglobin A1c 7.6%. - Continue Glyxambi  and Toujeo . - Investigate Glyxambi  prescription issue.  Nocturia Experiencing nocturia five times per night. Likely related  to BPH with normal PSA levels. - Try over-the-counter prostate vitamins. - Consider Flomax if no improvement.  Essential hypertension Blood pressure well-controlled at 126/65 mmHg.  Mixed hyperlipidemia Cholesterol levels well-controlled with current treatment. - Continue pravastatin  and fish oil.  Follow-Up Ensure insurance and medication coverage addressed before year-end. - Schedule appointment with Julie before year-end.       Follow up plan: Return in about 3 months (around 04/30/2024), or if symptoms worsen or fail to improve, for Diabetes recheck.  Counseling provided for all of the vaccine components No orders of the defined types were placed in this encounter.   Fonda Levins, MD [facility]  01/29/2024, 8:33 AM

## 2024-01-29 NOTE — Addendum Note (Signed)
 Addended by: MARYANNE CHEW on: 01/29/2024 08:35 AM   Modules accepted: Orders

## 2024-01-30 ENCOUNTER — Encounter: Payer: Self-pay | Admitting: Physician Assistant

## 2024-01-30 ENCOUNTER — Telehealth: Payer: Self-pay | Admitting: Pharmacist

## 2024-01-30 ENCOUNTER — Ambulatory Visit: Admitting: Physician Assistant

## 2024-01-30 VITALS — BP 140/70 | HR 74 | Ht 73.0 in | Wt 146.4 lb

## 2024-01-30 DIAGNOSIS — Z860101 Personal history of adenomatous and serrated colon polyps: Secondary | ICD-10-CM | POA: Diagnosis not present

## 2024-01-30 DIAGNOSIS — K5900 Constipation, unspecified: Secondary | ICD-10-CM | POA: Diagnosis not present

## 2024-01-30 DIAGNOSIS — E1169 Type 2 diabetes mellitus with other specified complication: Secondary | ICD-10-CM

## 2024-01-30 MED ORDER — NA SULFATE-K SULFATE-MG SULF 17.5-3.13-1.6 GM/177ML PO SOLN: 1.0000 | Freq: Once | ORAL | 0 refills | Status: AC

## 2024-01-30 MED ORDER — GLYXAMBI 25-5 MG PO TABS: 1.0000 | ORAL_TABLET | Freq: Every day | ORAL | 4 refills | Status: AC

## 2024-01-30 NOTE — Telephone Encounter (Signed)
 Please enroll PAP for toujeo  20 units daily Send PAP to PharmD Patient's copay $120 at pharmacy Thanks!

## 2024-01-30 NOTE — Addendum Note (Signed)
 Addended by: WILL POWELL CROME on: 01/30/2024 10:35 AM   Modules accepted: Orders

## 2024-01-30 NOTE — Patient Instructions (Signed)
 You have been scheduled for a colonoscopy. Please follow written instructions given to you at your visit today.   If you use inhalers (even only as needed), please bring them with you on the day of your procedure.  DO NOT TAKE 7 DAYS PRIOR TO TEST- Trulicity (dulaglutide) Ozempic, Wegovy (semaglutide) Mounjaro (tirzepatide) Bydureon Bcise (exanatide extended release)  DO NOT TAKE 1 DAY PRIOR TO YOUR TEST Rybelsus (semaglutide) Adlyxin (lixisenatide) Victoza (liraglutide) Byetta (exanatide) ___________________________________________________________________________  _______________________________________________________  If your blood pressure at your visit was 140/90 or greater, please contact your primary care physician to follow up on this.  _______________________________________________________  If you are age 19 or older, your body mass index should be between 23-30. Your Body mass index is 19.31 kg/m. If this is out of the aforementioned range listed, please consider follow up with your Primary Care Provider.  If you are age 62 or younger, your body mass index should be between 19-25. Your Body mass index is 19.31 kg/m. If this is out of the aformentioned range listed, please consider follow up with your Primary Care Provider.   ________________________________________________________  The [facility] providers would like to encourage you to use MYCHART to communicate with providers for non-urgent requests or questions.  Due to long hold times on the telephone, sending your provider a message by [facility] may be a faster and more efficient way to get a response.  Please allow 48 business hours for a response.  Please remember that this is for non-urgent requests.  _______________________________________________________  [facility] is using a team-based approach to care.  Your team is made up of your doctor and two to three APPS. Our APPS (Nurse Practitioners and  Physician Assistants) work with your physician to ensure care continuity for you. They are fully qualified to address your health concerns and develop a treatment plan. They communicate directly with your gastroenterologist to care for you. Seeing the Advanced Practice Practitioners on your physician's team can help you by facilitating care more promptly, often allowing for earlier appointments, access to diagnostic testing, procedures, and other specialty referrals.

## 2024-01-31 ENCOUNTER — Other Ambulatory Visit (HOSPITAL_COMMUNITY): Payer: Self-pay

## 2024-01-31 ENCOUNTER — Telehealth: Payer: Self-pay

## 2024-01-31 NOTE — Progress Notes (Signed)
 Pharmacy Medication Assistance Program Note    01/31/2024  Patient ID: John Reese, male  DOB: 11/05/1950, 73 y.o.  MRN:  [id number]     01/31/2024  Outreach Medication Two  Manufacturer Medication Two Sanofi  [employer] Toujeo   Type of Audiological scientist Items Requested Application  Date Application Sent to Prescriber 01/31/2024     New - emailed to [employer]

## 2024-02-09 ENCOUNTER — Other Ambulatory Visit: Payer: Self-pay | Admitting: Family Medicine

## 2024-02-09 DIAGNOSIS — G500 Trigeminal neuralgia: Secondary | ICD-10-CM

## 2024-02-11 DIAGNOSIS — H903 Sensorineural hearing loss, bilateral: Secondary | ICD-10-CM | POA: Diagnosis not present

## 2024-02-13 ENCOUNTER — Encounter: Payer: Self-pay | Admitting: Gastroenterology

## 2024-02-13 ENCOUNTER — Telehealth: Payer: Self-pay

## 2024-02-13 NOTE — Telephone Encounter (Signed)
 Patients wife reached out needing help on patients BI Cares re-enrollment app and wanted to verify the income limits.   Says their annual income is about $47k before their 77k is distributed. I informed patients wife to just use their SSI for now and we will go from there as used in the past.   She will bring completed app to office.

## 2024-02-18 NOTE — Telephone Encounter (Signed)
 BI Cares application received from patient.   Faxed provider pages to PCP

## 2024-02-21 ENCOUNTER — Ambulatory Visit: Admitting: Gastroenterology

## 2024-02-21 ENCOUNTER — Encounter: Payer: Self-pay | Admitting: Gastroenterology

## 2024-02-21 VITALS — BP 101/68 | HR 76 | Temp 97.7°F | Resp 13 | Ht 73.0 in | Wt 146.6 lb

## 2024-02-21 DIAGNOSIS — Z1211 Encounter for screening for malignant neoplasm of colon: Secondary | ICD-10-CM | POA: Diagnosis not present

## 2024-02-21 DIAGNOSIS — D122 Benign neoplasm of ascending colon: Secondary | ICD-10-CM

## 2024-02-21 DIAGNOSIS — D123 Benign neoplasm of transverse colon: Secondary | ICD-10-CM | POA: Diagnosis not present

## 2024-02-21 DIAGNOSIS — Z860101 Personal history of adenomatous and serrated colon polyps: Secondary | ICD-10-CM

## 2024-02-21 DIAGNOSIS — E785 Hyperlipidemia, unspecified: Secondary | ICD-10-CM | POA: Diagnosis not present

## 2024-02-21 DIAGNOSIS — E119 Type 2 diabetes mellitus without complications: Secondary | ICD-10-CM | POA: Diagnosis not present

## 2024-02-21 DIAGNOSIS — K648 Other hemorrhoids: Secondary | ICD-10-CM | POA: Diagnosis not present

## 2024-02-21 DIAGNOSIS — K644 Residual hemorrhoidal skin tags: Secondary | ICD-10-CM | POA: Diagnosis not present

## 2024-02-21 MED ORDER — SODIUM CHLORIDE 0.9 % IV SOLN: 500.0000 mL | Freq: Once | INTRAVENOUS | Status: DC

## 2024-02-21 MED ORDER — DEXTROSE 5 % IV SOLN: INTRAVENOUS | Status: AC

## 2024-02-21 NOTE — Progress Notes (Signed)
 Blood sugar upon admission- 63.  No symptoms but D5 IV hung  750- rechecked- blood sugar 75

## 2024-02-21 NOTE — Patient Instructions (Signed)
 YOU HAD AN ENDOSCOPIC PROCEDURE TODAY AT THE [facility]:   Refer to the procedure report that was given to you for any specific questions about what was found during the examination.  If the procedure report does not answer your questions, please call your gastroenterologist to clarify.  If you requested that your care partner not be given the details of your procedure findings, then the procedure report has been included in a sealed envelope for you to review at your convenience later.  YOU SHOULD EXPECT: Some feelings of bloating in the abdomen. Passage of more gas than usual.  Walking can help get rid of the air that was put into your GI tract during the procedure and reduce the bloating. If you had a lower endoscopy (such as a colonoscopy or flexible sigmoidoscopy) you may notice spotting of blood in your stool or on the toilet paper. If you underwent a bowel prep for your procedure, you may not have a normal bowel movement for a few days.  Please Note:  You might notice some irritation and congestion in your nose or some drainage.  This is from the oxygen used during your procedure.  There is no need for concern and it should clear up in a day or so.  SYMPTOMS TO REPORT IMMEDIATELY:  Following lower endoscopy (colonoscopy or flexible sigmoidoscopy):  Excessive amounts of blood in the stool  Significant tenderness or worsening of abdominal pains  Swelling of the abdomen that is new, acute  Fever of 100F or higher  Resume previous diet Continue present medications Await pathology results No repeat colonoscopy due to age   For urgent or emergent issues, a gastroenterologist can be reached at any hour by calling [phone]. Do not use MyChart messaging for urgent concerns.    DIET:  We do recommend a small meal at first, but then you may proceed to your regular diet.  Drink plenty of fluids but you should avoid alcoholic beverages for 24 hours.  ACTIVITY:  You should  plan to take it easy for the rest of today and you should NOT DRIVE or use heavy machinery until tomorrow (because of the sedation medicines used during the test).    FOLLOW UP: Our staff will call the number listed on your records the next business day following your procedure.  We will call around 7:15- 8:00 am to check on you and address any questions or concerns that you may have regarding the information given to you following your procedure. If we do not reach you, we will leave a message.     If any biopsies were taken you will be contacted by phone or by letter within the next 1-3 weeks.  Please call us  at [phone] if you have not heard about the biopsies in 3 weeks.    SIGNATURES/CONFIDENTIALITY: You and/or your care partner have signed paperwork which will be entered into your electronic medical record.  These signatures attest to the fact that that the information above on your After Visit Summary has been reviewed and is understood.  Full responsibility of the confidentiality of this discharge information lies with you and/or your care-partner.

## 2024-02-21 NOTE — Progress Notes (Signed)
 [facility] History and Physical   Primary Care Physician:  [name], Fonda LABOR, MD   Reason for Procedure:  History of adenomatous colon polyps  Plan:    Surveillance colonoscopy with possible interventions as needed     HPI: John Reese is a very pleasant 73 y.o. male here for surveillance colonoscopy. Denies any nausea, vomiting, abdominal pain, melena or bright red blood per rectum  The risks and benefits as well as alternatives of endoscopic procedure(s) have been discussed and reviewed.  The patient was provided an opportunity to ask questions and all were answered. The patient agreed with the plan and demonstrated an understanding of the instructions.   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    Tip   Diabetes (HCC)    on meds   Dyslipidemia    Hyperlipidemia    on meds   Tremor    has a deep brain stimulator in place to treat -   Trigeminal neuralgia    Right, V2 distribution    Past Surgical History:  Procedure Laterality Date   COLONOSCOPY  2021   FINGER AMPUTATION Right 1968   RIGHT pointer finger end   INNER EAR SURGERY Right    Ear drum repair   POLYPECTOMY  2021   TA's   PULSE GENERATOR IMPLANT Bilateral 03/30/2016   Procedure: BILATERAL PLACEMENT OF IMPLANTABLE PULSE GENERATOR;  Surgeon: Fairy Levels, MD;  Location: [facility] OR;  Service: Neurosurgery;  Laterality: Bilateral;  BILATERAL PLACEMENT OF IMPLANTABLE PULSE GENERATOR   SUBTHALAMIC STIMULATOR BATTERY REPLACEMENT N/A 11/01/2020   Procedure: Implantable pulse generator battery change, Right and left chest;  Surgeon: Levels Fairy, MD;  Location: [facility] OR;  Service: Neurosurgery;  Laterality: N/A;  3C/RM 21   SUBTHALAMIC STIMULATOR INSERTION Bilateral 03/22/2016   Procedure: BILATERAL DEEP BRAIN STIMULATOR PLACEMENT WITH STARFIX WITH DR. EVONNIE;  Surgeon: Levels Fairy,  MD;  Location: [facility] OR;  Service: Neurosurgery;  Laterality: Bilateral;    Prior to Admission medications   Medication Sig Start Date End Date Taking? Authorizing Provider  aspirin EC 81 MG tablet Take 81 mg by mouth daily.   Yes [provider]  CINNAMON  PO Take 1,000 mg by mouth daily.   Yes [provider]  fish oil-omega-3 fatty acids  1000 MG capsule Take 1 g by mouth daily.   Yes [provider]  GLYXAMBI  25-5 MG TABS Take 1 tablet by mouth daily. 01/30/24  Yes [name], Fonda LABOR, MD  insulin  glargine, 1 Unit Dial , (TOUJEO  SOLOSTAR) 300 UNIT/ML Solostar Pen Inject 10 Units into the skin at bedtime. 01/29/24  Yes [name], Fonda LABOR, MD  Insulin  Pen Needle (BD PEN NEEDLE MINI ULTRAFINE) 31G X 5 MM MISC UAD at bedtime Dx E11.9 09/18/23  Yes [name], Fonda LABOR, MD  Multiple Vitamin (MULTIVITAMIN) tablet Take 1 tablet by mouth daily.   Yes [provider]  Oxcarbazepine  (TRILEPTAL ) 300 MG tablet TAKE 1 TABLET BY MOUTH 2 TIMES DAILY. 02/10/24  Yes [name], Fonda LABOR, MD  pravastatin  (PRAVACHOL ) 40 MG tablet Take 1 tablet (40 mg total) by mouth daily. 07/05/23  Yes [name], Fonda LABOR, MD    Current Outpatient Medications  Medication Sig Dispense Refill   aspirin EC 81 MG tablet Take 81 mg by mouth daily.     CINNAMON  PO Take 1,000 mg by mouth daily.     fish oil-omega-3 fatty acids  1000 MG  capsule Take 1 g by mouth daily.     GLYXAMBI  25-5 MG TABS Take 1 tablet by mouth daily. 90 tablet 4   insulin  glargine, 1 Unit Dial , (TOUJEO  SOLOSTAR) 300 UNIT/ML Solostar Pen Inject 10 Units into the skin at bedtime. 3 mL 3   Insulin  Pen Needle (BD PEN NEEDLE MINI ULTRAFINE) 31G X 5 MM MISC UAD at bedtime Dx E11.9 100 each 3   Multiple Vitamin (MULTIVITAMIN) tablet Take 1 tablet by mouth daily.     Oxcarbazepine  (TRILEPTAL ) 300 MG tablet TAKE 1 TABLET BY MOUTH 2 TIMES DAILY. 60 tablet 2   pravastatin  (PRAVACHOL ) 40 MG tablet Take 1 tablet (40 mg total) by mouth daily. 90  tablet 3   Current Facility-Administered Medications  Medication Dose Route Frequency Provider Last Rate Last Admin   0.9 %  sodium chloride  infusion  500 mL Intravenous Once Naziyah Tieszen V, MD       dextrose  5 % solution   Intravenous Continuous Ashliegh Parekh V, MD        Allergies as of 02/21/2024   (No Known Allergies)    Family History  Problem Relation Age of Onset   Colon cancer Mother 24   Stroke Mother    Colon polyps Mother 36   Stroke Father    Peripheral vascular disease Sister    Healthy Sister    Heart attack Brother    Healthy Brother    Healthy Brother    Healthy Brother    Healthy Brother    Healthy Daughter    Healthy Son    Esophageal cancer Neg Hx    Stomach cancer Neg Hx    Rectal cancer Neg Hx     Social History   Socioeconomic History   Marital status: Married    Spouse name: Not on file   Number of children: 2   Years of education: HS   Highest education level: 12th grade  Occupational History   Occupation: Works in Cytogeneticist: [employer]    Comment: RETIRED  Tobacco Use   Smoking status: Never   Smokeless tobacco: Never  Vaping Use   Vaping status: Never Used  Substance and Sexual Activity   Alcohol use: No   Drug use: No   Sexual activity: Not on file  Other Topics Concern   Not on file  Social History Narrative   Patient is right handed.   Patient drinks 2 cups coffee daily.   Social Drivers of Corporate Investment Banker Strain: Low Risk  (12/03/2023)   Overall Financial Resource Strain (CARDIA)    Difficulty of Paying Living Expenses: Not hard at all  Food Insecurity: No Food Insecurity (12/03/2023)   Hunger Vital Sign    Worried About Running Out of Food in the Last Year: Never true    Ran Out of Food in the Last Year: Never true  Transportation Needs: No Transportation Needs (12/03/2023)   PRAPARE - Administrator, Civil Service (Medical): No    Lack of Transportation (Non-Medical): No   Physical Activity: Sufficiently Active (12/03/2023)   Exercise Vital Sign    Days of Exercise per Week: 7 days    Minutes of Exercise per Session: 30 min  Stress: No Stress Concern Present (12/03/2023)   [employer] of Occupational Health - Occupational Stress Questionnaire    Feeling of Stress: Not at all  Social Connections: Socially Integrated (09/03/2022)   Social Connection and Isolation Panel    Frequency  of Communication with Friends and Family: More than three times a week    Frequency of Social Gatherings with Friends and Family: More than three times a week    Attends Religious Services: More than 4 times per year    Active Member of [employer] or Organizations: Yes    Attends Engineer, Structural: More than 4 times per year    Marital Status: Married  Catering Manager Violence: Not At Risk (12/03/2023)   Humiliation, Afraid, Rape, and Kick questionnaire    Fear of Current or Ex-Partner: No    Emotionally Abused: No    Physically Abused: No    Sexually Abused: No    Review of Systems:  All other review of systems negative except as mentioned in the HPI.  Physical Exam: Vital signs in last 24 hours: BP 131/66   Pulse 77   Temp 97.7 F (36.5 C) (Skin)   Resp 13   Ht 6' 1 (1.854 m)   Wt 146 lb 9.6 oz (66.5 kg)   SpO2 100%   BMI 19.34 kg/m  General:   Alert, NAD Lungs:  Clear .   Heart:  Regular rate and rhythm Abdomen:  Soft, nontender and nondistended. Neuro/Psych:  Alert and cooperative. Normal mood and affect. A and O x 3  Reviewed labs, radiology imaging, old records and pertinent past GI work up  Patient is appropriate for planned procedure(s) and anesthesia in an ambulatory setting   K. Veena Byron Tipping , MD [phone]

## 2024-02-21 NOTE — Progress Notes (Signed)
 To pacu, VSS. Report to RN.tb

## 2024-02-21 NOTE — Op Note (Signed)
 [facility] Patient Name: John Reese Procedure Date: 02/21/2024 7:19 AM MRN: [id number] Endoscopist: Gustav ALONSO Mcgee , MD, [id number] Age: 73 Referring MD:  Date of Birth: [birth year] Gender: Male Account #: [id number] Procedure:                Colonoscopy Indications:              High risk colon cancer surveillance: Personal                            history of multiple (3 or more) adenomas Medicines:                Monitored Anesthesia Care Procedure:                Pre-Anesthesia Assessment:                           - Prior to the procedure, a History and Physical                            was performed, and patient medications and                            allergies were reviewed. The patient's tolerance of                            previous anesthesia was also reviewed. The risks                            and benefits of the procedure and the sedation                            options and risks were discussed with the patient.                            All questions were answered, and informed consent                            was obtained. Prior Anticoagulants: The patient has                            taken no anticoagulant or antiplatelet agents. ASA                            Grade Assessment: II - A patient with mild systemic                            disease. After reviewing the risks and benefits,                            the patient was deemed in satisfactory condition to                            undergo the procedure.  After obtaining informed consent, the colonoscope                            was passed under direct vision. Throughout the                            procedure, the patient's blood pressure, pulse, and                            oxygen saturations were monitored continuously. The                            [id number]-HQ190L Colonoscope [id number] was introduced                            through the anus and  advanced to the the cecum,                            identified by the appendiceal orifice, ileocecal                            valve and palpation. The colonoscopy was performed                            without difficulty. The patient tolerated the                            procedure well. The quality of the bowel                            preparation was good. The ileocecal valve,                            appendiceal orifice, and rectum were photographed. Scope In: 8:13:45 AM Scope Out: 8:26:16 AM Scope Withdrawal Time: 0 hours 9 minutes 23 seconds  Total Procedure Duration: 0 hours 12 minutes 31 seconds  Findings:                 The perianal and digital rectal examinations were                            normal.                           Two sessile polyps were found in the transverse                            colon and ascending colon. The polyps were 4 to 6                            mm in size. These polyps were removed with a cold                            snare. Resection and retrieval were complete.  Non-bleeding external and internal hemorrhoids were                            found during retroflexion. The hemorrhoids were                            medium-sized. Complications:            No immediate complications. Estimated Blood Loss:     Estimated blood loss was minimal. Impression:               - Two 4 to 6 mm polyps in the transverse colon and                            in the ascending colon, removed with a cold snare.                            Resected and retrieved.                           - Non-bleeding external and internal hemorrhoids. Recommendation:           - Resume previous diet.                           - Continue present medications.                           - Await pathology results.                           - No repeat colonoscopy due to age. Fynn Adel V. Meilech Virts, MD 02/21/2024 8:37:44 AM This report has been  signed electronically.

## 2024-02-21 NOTE — Progress Notes (Signed)
 Called to room to assist during endoscopic procedure.  Patient ID and intended procedure confirmed with present staff. Received instructions for my participation in the procedure from the performing physician.

## 2024-02-24 ENCOUNTER — Telehealth: Payer: Self-pay

## 2024-02-24 NOTE — Telephone Encounter (Signed)
  Follow up Call-     02/21/2024    7:19 AM  Call back number  Post procedure Call Back phone  # [phone]  Permission to leave phone message Yes     Patient questions:  Do you have a fever, pain , or abdominal swelling? No. Pain Score  0 *  Have you tolerated food without any problems? Yes.    Have you been able to return to your normal activities? Yes.    Do you have any questions about your discharge instructions: Diet   No. Medications  No. Follow up visit  No.  Do you have questions or concerns about your Care? No.  Actions: * If pain score is 4 or above: No action needed, pain <4.

## 2024-02-25 LAB — SURGICAL PATHOLOGY

## 2024-03-04 ENCOUNTER — Ambulatory Visit: Payer: Self-pay | Admitting: Gastroenterology

## 2024-03-13 ENCOUNTER — Telehealth: Payer: Self-pay | Admitting: Pharmacist

## 2024-03-13 ENCOUNTER — Other Ambulatory Visit (HOSPITAL_COMMUNITY): Payer: Self-pay

## 2024-03-13 NOTE — Telephone Encounter (Signed)
   Patient enrolled in patient assistance with BI cares (Glyxambi ) and [employer] (Toujeo ).  Assisted with completion of patient assistance paperwork and escribed all medications to appropriate patient assistance pharmacy.  Patient stable, but A1c still above goal.  Will follow up to titrate insulin . Compliant with statin since June-myopathy but okay on pravastatin --LDL still above goal <70, 86 Was on ramipril  in years past, but unclear why it was discontinued; on SGLT2 currently; UACR<7.  Follow up with PharmD 04/03/24   Mliss Tarry Griffin, PharmD, BCACP, CPP Clinical Pharmacist, [facility]

## 2024-03-13 NOTE — Telephone Encounter (Signed)
 Rec'd completed application from provider.  PAP: re-enrollment application for GLYXAMBI  25-5MG  has been submitted to [employer]), via fax.

## 2024-03-17 NOTE — Telephone Encounter (Signed)
 PAP: application for Toujeo  has been submitted to [employer], via fax

## 2024-03-30 NOTE — Telephone Encounter (Signed)
 PAP: Patient assistance re-enrollment application for Toujeo  has been approved by PAP Companies: [employer] from 03/27/24 to 04/22/25.   Medication should be delivered to: Provider's office.   For further shipping updates, please contact [employer] at [phone].   Patient ID is: [id number]

## 2024-04-02 NOTE — Telephone Encounter (Signed)
 Per [employer] C[employer]: Proof of income needed.  Contacted patients wife. Says she will look for their SSI statement and bring it by the office as soon possible.

## 2024-04-02 NOTE — Telephone Encounter (Signed)
 Faxed income to company (SSI statements for him and wife).

## 2024-04-03 ENCOUNTER — Ambulatory Visit (INDEPENDENT_AMBULATORY_CARE_PROVIDER_SITE_OTHER): Admitting: Pharmacist

## 2024-04-03 DIAGNOSIS — E785 Hyperlipidemia, unspecified: Secondary | ICD-10-CM | POA: Diagnosis not present

## 2024-04-03 DIAGNOSIS — G720 Drug-induced myopathy: Secondary | ICD-10-CM

## 2024-04-03 DIAGNOSIS — Z7984 Long term (current) use of oral hypoglycemic drugs: Secondary | ICD-10-CM

## 2024-04-03 DIAGNOSIS — E119 Type 2 diabetes mellitus without complications: Secondary | ICD-10-CM

## 2024-04-03 DIAGNOSIS — Z794 Long term (current) use of insulin: Secondary | ICD-10-CM

## 2024-04-03 NOTE — Progress Notes (Signed)
 "   04/03/2024 Name: John Reese MRN: [id number] DOB: 1950/08/08  Chief Complaint  Patient presents with   Diabetes   Medication Management    John Reese is a 73 y.o. year old male who was referred for medication management by their primary care provider, [name], Fonda LABOR, MD. They presented for a clinic visit today.   They were referred to the pharmacist by their PCP for assistance in managing hyperlipidemia/cardiovascular risk reduction and medication access    Subjective:  Patient stable, but A1c still above goal.  Will follow up to titrate insulin . Compliant with statin since June-myopathy but okay on pravastatin --LDL still above goal <70, 86 Was on ramipril  in years past, but unclear why it was discontinued; on SGLT2 currently; UACR<7. Re-enrolled for PAP next year (Toujeo , Glyxambi )  Care Team: Primary Care Provider: [name], Fonda LABOR, MD    Medication Access/Adherence  Current Pharmacy:  [facility] [phone] - [place], [place] - [address] [place] [address] Phone: [phone] Fax: [phone]  [facility] - [place], [place] - [address] [place] [address] Phone: [phone] Fax: [phone]  [facility] [address], [place] - [address] [place] [address] [place] [address] [place] [address] Phone: [phone] Fax: [phone]  [facility] - [place], [place] - [address] [place] [address] Phone: [phone] Fax: [phone]  [facility] - [place], [place] - [address] [place] [address] Phone: [phone] Fax: [phone]   Patient reports affordability concerns with their medications: Yes  Patient reports access/transportation concerns to their pharmacy: No  Patient reports adherence concerns with their medications:  No     Diabetes:  Current medications: Glyxambi , Toujeo  Medications tried in the past:  Qtern , metformin , glyburide, Inovkana, Jardiance , Lantus , pioglitazone  Current glucose readings: 90-300s At night Toujeo  10 units nightly When he tried to increase to 12 units  Wife reports sugars are high  PM sugars 159, 117, 200-300s at times AM 80-110s Doesn't like sweets Does enjoy other carbs Stays hungry, but doesn't go over board  Patient denies hypoglycemic s/sx including dizziness, shakiness, sweating. Patient denies hyperglycemic symptoms including polyuria, polydipsia, polyphagia, nocturia, neuropathy, blurred vision.  Current meal patterns:  Discussed meal planning options and Plate method for healthy eating Avoid sugary drinks and desserts Incorporate balanced protein, non starchy veggies, 1 serving of carbohydrate with each meal Increase water intake Increase physical activity as able  Current physical activity: encouraged as able; active with grandkids  Current medication access support: BI cares, [employer] PAP  Macrovascular and Microvascular Risk Reduction:  Statin? yes (pravastatin  LF 01/14/24); ACEi/ARB? Was on ramipril  in years past, but unclear why it was discontinued; on SGLT2 currently; UACR<7. Last urinary albumin/creatinine ratio:  Lab Results  Component Value Date   MICRALBCREAT <7 10/28/2023   MICRALBCREAT <10 02/28/2022   MICRALBCREAT 1 10/06/2019   MICRALBCREAT 2 06/03/2017   Last eye exam:  Lab Results  Component Value Date   HMDIABEYEEXA No Retinopathy 05/21/2023   Last foot exam: 11/24/2021 Tobacco Use:  Tobacco Use: Low Risk (02/21/2024)   Patient History    Smoking Tobacco Use: Never    Smokeless Tobacco Use: Never    Passive Exposure: Not on file   Hyperlipidemia/ASCVD Risk Reduction  Current lipid lowering medications: pravastatin  Medications tried in the past: rosuvastatin  Antiplatelet regimen: ASA 81mg   ASCVD History: n/a Family History: stroke-mother/father  Risk Factors: CAD, CKD  Current physical activity: encouraged  as able  Objective:  Lab Results  Component Value Date   HGBA1C 7.6 (H) 01/21/2024    Lab Results  Component Value Date   CREATININE 1.18 01/22/2024   BUN 23 01/22/2024   NA 138 01/22/2024   K 5.1 01/22/2024   CL 98 01/22/2024   CO2 23 01/22/2024    Lab Results  Component Value Date   CHOL 150 01/21/2024   HDL 52 01/21/2024   LDLCALC 86 01/21/2024   TRIG 58 01/21/2024   CHOLHDL 2.9 01/21/2024    Medications Reviewed Today   Medications were not reviewed in this encounter       Assessment/Plan:   Diabetes: - Currently uncontrolled; goal A1c <7%. Cardiorenal risk reduction is opportunities for improvement.. Blood pressure is at goal <130/80. LDL is not at goal.  - Reviewed long term cardiovascular and renal outcomes of uncontrolled blood sugar. and Reviewed goal A1c, goal fasting, and goal 2 hour post prandial glucose. Recommended to check glucose fasting daily or if symptomatic - Continue current therapy but also consider increasing insulin  if blood sugars not controlled on current regimen.  Limited options due to cost. -Toujeo  here for pick up--my chart message sent  Hyperlipidemia/ASCVD Risk Reduction: - Currently uncontrolled.  - Reviewed long term complications of uncontrolled cholesterol - Reviewed dietary recommendations including FOLLOWING A HEART HEALTHY DIET/HEALTHY PLATE METHOD - Reviewed lifestyle recommendations including increased physical activity as able  - Recommend to continue pravastatin ; G72 statin myopathy    Follow Up Plan: 1 month, PCP in Jan 2026  Mliss Tarry Griffin, PharmD, BCACP, CPP Clinical Pharmacist, [facility]   "

## 2024-04-06 ENCOUNTER — Encounter: Payer: Self-pay | Admitting: Pharmacist

## 2024-04-24 NOTE — Telephone Encounter (Signed)
 PAP: Patient assistance re-enrollment application for GLYXAMBI  25/5MG  has been approved by PAP Companies: [employer] from 04/08/24 to 04/22/25.   Medication should be delivered to: Home.   For further shipping updates, please contact [employer] ([employer]) at [phone].

## 2024-05-02 ENCOUNTER — Other Ambulatory Visit: Payer: Self-pay | Admitting: Family Medicine

## 2024-05-02 DIAGNOSIS — G500 Trigeminal neuralgia: Secondary | ICD-10-CM

## 2024-05-07 ENCOUNTER — Encounter: Payer: Self-pay | Admitting: Family Medicine

## 2024-05-07 ENCOUNTER — Ambulatory Visit: Payer: Self-pay | Admitting: Family Medicine

## 2024-05-07 VITALS — BP 136/68 | HR 70 | Ht 73.0 in | Wt 149.0 lb

## 2024-05-07 DIAGNOSIS — Z794 Long term (current) use of insulin: Secondary | ICD-10-CM

## 2024-05-07 DIAGNOSIS — E1159 Type 2 diabetes mellitus with other circulatory complications: Secondary | ICD-10-CM | POA: Diagnosis not present

## 2024-05-07 DIAGNOSIS — E1169 Type 2 diabetes mellitus with other specified complication: Secondary | ICD-10-CM

## 2024-05-07 DIAGNOSIS — G500 Trigeminal neuralgia: Secondary | ICD-10-CM | POA: Diagnosis not present

## 2024-05-07 DIAGNOSIS — I152 Hypertension secondary to endocrine disorders: Secondary | ICD-10-CM | POA: Diagnosis not present

## 2024-05-07 DIAGNOSIS — E782 Mixed hyperlipidemia: Secondary | ICD-10-CM

## 2024-05-07 DIAGNOSIS — E785 Hyperlipidemia, unspecified: Secondary | ICD-10-CM

## 2024-05-07 DIAGNOSIS — I10 Essential (primary) hypertension: Secondary | ICD-10-CM

## 2024-05-07 LAB — CBC WITH DIFFERENTIAL/PLATELET
Basophils Absolute: 0 x10E3/uL (ref 0.0–0.2)
Basos: 1 %
EOS (ABSOLUTE): 0.1 x10E3/uL (ref 0.0–0.4)
Eos: 2 %
Hematocrit: 42.9 % (ref 37.5–51.0)
Hemoglobin: 13.9 g/dL (ref 13.0–17.7)
Immature Grans (Abs): 0 x10E3/uL (ref 0.0–0.1)
Immature Granulocytes: 0 %
Lymphocytes Absolute: 1.9 x10E3/uL (ref 0.7–3.1)
Lymphs: 34 %
MCH: 29.6 pg (ref 26.6–33.0)
MCHC: 32.4 g/dL (ref 31.5–35.7)
MCV: 92 fL (ref 79–97)
Monocytes Absolute: 0.4 x10E3/uL (ref 0.1–0.9)
Monocytes: 8 %
Neutrophils Absolute: 3.1 x10E3/uL (ref 1.4–7.0)
Neutrophils: 54 %
Platelets: 157 x10E3/uL (ref 150–450)
RBC: 4.69 x10E6/uL (ref 4.14–5.80)
RDW: 12.5 % (ref 11.6–15.4)
WBC: 5.7 x10E3/uL (ref 3.4–10.8)

## 2024-05-07 LAB — CMP14+EGFR
ALT: 19 IU/L (ref 0–44)
AST: 17 IU/L (ref 0–40)
Albumin: 4.5 g/dL (ref 3.8–4.8)
Alkaline Phosphatase: 54 IU/L (ref 47–123)
BUN/Creatinine Ratio: 22 (ref 10–24)
BUN: 25 mg/dL (ref 8–27)
Bilirubin Total: 0.3 mg/dL (ref 0.0–1.2)
CO2: 25 mmol/L (ref 20–29)
Calcium: 9.9 mg/dL (ref 8.6–10.2)
Chloride: 100 mmol/L (ref 96–106)
Creatinine, Ser: 1.13 mg/dL (ref 0.76–1.27)
Globulin, Total: 2.2 g/dL (ref 1.5–4.5)
Glucose: 123 mg/dL — ABNORMAL HIGH (ref 70–99)
Potassium: 4 mmol/L (ref 3.5–5.2)
Sodium: 139 mmol/L (ref 134–144)
Total Protein: 6.7 g/dL (ref 6.0–8.5)
eGFR: 68 mL/min/1.73

## 2024-05-07 LAB — LIPID PANEL
Chol/HDL Ratio: 3.4 ratio (ref 0.0–5.0)
Cholesterol, Total: 160 mg/dL (ref 100–199)
HDL: 47 mg/dL
LDL Chol Calc (NIH): 93 mg/dL (ref 0–99)
Triglycerides: 113 mg/dL (ref 0–149)
VLDL Cholesterol Cal: 20 mg/dL (ref 5–40)

## 2024-05-07 LAB — TSH: TSH: 1.31 u[IU]/mL (ref 0.450–4.500)

## 2024-05-07 LAB — BAYER DCA HB A1C WAIVED: HB A1C (BAYER DCA - WAIVED): 7.6 % — ABNORMAL HIGH (ref 4.8–5.6)

## 2024-05-07 MED ORDER — OXCARBAZEPINE 300 MG PO TABS: 300.0000 mg | ORAL_TABLET | Freq: Two times a day (BID) | ORAL | 3 refills | Status: AC

## 2024-05-07 MED ORDER — PRAVASTATIN SODIUM 40 MG PO TABS: 40.0000 mg | ORAL_TABLET | Freq: Every day | ORAL | 3 refills | Status: AC

## 2024-05-07 NOTE — Progress Notes (Signed)
 "  BP 136/68   Pulse 70   Ht 6' 1 (1.854 m)   Wt 149 lb (67.6 kg)   SpO2 97%   BMI 19.66 kg/m    Subjective:   Patient ID: John Reese Bud, male    DOB: 1951/04/17, 74 y.o.   MRN: [id number]  HPI: John Reese is a 74 y.o. male presenting on 05/07/2024 for Medical Management of Chronic Issues, Diabetes, and Hypertension   Discussed the use of AI scribe software for clinical note transcription with the patient, who gave verbal consent to proceed.  History of Present Illness   John Reese is a 74 year old male with diabetes who presents for a recheck of his blood sugar levels.  Glycemic control - Blood glucose levels fluctuating since before Christmas - Nocturnal blood glucose: 130-160 mg/dL - Morning blood glucose: 85-110 mg/dL - Current regimen: Toujeo  10 units nightly, Glyxambi  every morning - No pain or side effects from current diabetes medications  Neuropathic pain management - Uses medication for nerve pain - Finds medication effective - No side effects from nerve pain medication - Requests refill of nerve pain medication  Colorectal cancer screening - Recent colonoscopy performed with biopsy - Biopsy results reported as clean          Relevant past medical, surgical, family and social history reviewed and updated as indicated. Interim medical history since our last visit reviewed. Allergies and medications reviewed and updated.  Review of Systems  Constitutional:  Negative for chills and fever.  Eyes:  Negative for visual disturbance.  Respiratory:  Negative for shortness of breath and wheezing.   Cardiovascular:  Negative for chest pain and leg swelling.  Musculoskeletal:  Negative for back pain and gait problem.  Skin:  Negative for rash.  Neurological:  Negative for dizziness and light-headedness.  All other systems reviewed and are negative.   Per HPI unless specifically indicated above   Allergies as of 05/07/2024   No Known Allergies       Medication List        Accurate as of May 07, 2024 10:30 AM. If you have any questions, ask your nurse or doctor.          aspirin EC 81 MG tablet Take 81 mg by mouth daily.   BD Pen Needle Mini Ultrafine 31G X 5 MM Misc Generic drug: Insulin  Pen Needle UAD at bedtime Dx E11.9   CINNAMON  PO Take 1,000 mg by mouth daily.   fish oil-omega-3 fatty acids  1000 MG capsule Take 1 g by mouth daily.   Glyxambi  25-5 MG Tabs Generic drug: Empagliflozin -linaGLIPtin Take 1 tablet by mouth daily.   multivitamin tablet Take 1 tablet by mouth daily.   Oxcarbazepine  300 MG tablet Commonly known as: TRILEPTAL  Take 1 tablet (300 mg total) by mouth 2 (two) times daily.   pravastatin  40 MG tablet Commonly known as: PRAVACHOL  Take 1 tablet (40 mg total) by mouth daily.   Toujeo  SoloStar 300 UNIT/ML Solostar Pen Generic drug: insulin  glargine (1 Unit Dial ) Inject 10 Units into the skin at bedtime.         Objective:   BP 136/68   Pulse 70   Ht 6' 1 (1.854 m)   Wt 149 lb (67.6 kg)   SpO2 97%   BMI 19.66 kg/m   Wt Readings from Last 3 Encounters:  05/07/24 149 lb (67.6 kg)  02/21/24 146 lb 9.6 oz (66.5 kg)  01/30/24 146 lb 6 oz (66.4 kg)  Physical Exam Vitals and nursing note reviewed.  Constitutional:      General: He is not in acute distress.    Appearance: He is well-developed. He is not diaphoretic.  Eyes:     General: No scleral icterus.    Conjunctiva/sclera: Conjunctivae normal.  Neck:     Thyroid : No thyromegaly.  Cardiovascular:     Rate and Rhythm: Normal rate and regular rhythm.     Heart sounds: Normal heart sounds. No murmur heard. Pulmonary:     Effort: Pulmonary effort is normal. No respiratory distress.     Breath sounds: Normal breath sounds. No wheezing.  Musculoskeletal:        General: Normal range of motion.     Cervical back: Neck supple.  Lymphadenopathy:     Cervical: No cervical adenopathy.  Skin:    General: Skin is warm and  dry.     Findings: No rash.  Neurological:     Mental Status: He is alert and oriented to person, place, and time.     Coordination: Coordination normal.  Psychiatric:        Behavior: Behavior normal.    Physical Exam   VITALS: BP- 136/68 NECK: Thyroid  without nodules. CHEST: Lungs clear to auscultation bilaterally. CARDIOVASCULAR: Heart regular rate and rhythm, no murmurs. EXTREMITIES: No edema in lower extremities, good pulses.         Assessment & Plan:   Problem List Items Addressed This Visit       Cardiovascular and Mediastinum   Hypertension associated with diabetes (HCC)   Relevant Medications   pravastatin  (PRAVACHOL ) 40 MG tablet     Endocrine   Type 2 diabetes mellitus with other specified complication (HCC)   Relevant Medications   pravastatin  (PRAVACHOL ) 40 MG tablet   Other Relevant Orders   Bayer DCA Hb A1c Waived   CBC with Differential/Platelet   CMP14+EGFR   Lipid panel   TSH   Hyperlipidemia associated with type 2 diabetes mellitus (HCC) - Primary   Relevant Medications   pravastatin  (PRAVACHOL ) 40 MG tablet     Nervous and Auditory   Trigeminal neuralgia   Relevant Medications   Oxcarbazepine  (TRILEPTAL ) 300 MG tablet       Type 2 diabetes mellitus with other specified complication A1c at 7.6% indicating suboptimal control. - Continue Toujeo  10 units daily. - Continue Glyxambi  daily. - Encouraged dietary modifications and increased physical activity. - Instructed to bring glucose monitor to next appointment.  Trigeminal neuralgia Well-managed with current medication, no side effects.  Mixed hyperlipidemia Managed with pravastatin  and fish oils, no changes in lipid levels. - Continue pravastatin  and fish oils.  Essential hypertension Blood pressure at 136/68 mmHg.          Follow up plan: Return in about 3 months (around 08/05/2024), or if symptoms worsen or fail to improve, for Diabetes recheck.  Counseling provided for all  of the vaccine components Orders Placed This Encounter  Procedures   Bayer DCA Hb A1c Waived   CBC with Differential/Platelet   CMP14+EGFR   Lipid panel   TSH    Fonda Levins, MD [facility] 05/07/2024, 10:30 AM     "

## 2024-05-14 ENCOUNTER — Ambulatory Visit: Payer: Self-pay | Admitting: Family Medicine

## 2024-05-21 NOTE — Progress Notes (Signed)
 "   Assessment/Plan:    1.  Essential Tremor   -Status post DBS surgery.  IPG changed March 22, 2016, and November 01, 2020.  Battery checked today and looks good, but discussed with them that he will likely need IPG change, especially on the left IPG, within the next 6 to 8 months.  I asked them to check that monthly and look for the words ERI and to call should they see those words.  2.  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  -had V2/V3 rhizotomy with Dr. Unice 10/2019  -had repeat V2/V3 rhizotomy with Dr. Unice October, 2021.    - Patient was completely off of Lyrica  and carbamazepine , but went back on oxcarbazepine  in March, 2025.  He is only taking 1 every day to every other day and seems to be doing well right now.  - He saw Dr. Lanis for this in February, 2025.  He was told that they do not do the injections that are needed, now the symptoms are getting worse.  He was told that he needed to go to [place].   Told him that Dr. Janjua may do this but not positive.     Subjective:   John Reese was seen today in follow up for essential tremor.  My previous records were reviewed prior to todays visit.  Patient with wife who supplements history.  He has not had falls since last visit.  No lightheadedness or near syncope.   He notes more tremor if screwing in screw and wife states that this is only time he has tremor (R hand).  With everyday tasks, he is doing well.   In regards to his trigeminal neuralgia, his primary care is prescribing oxy carbamazepine , 300 mg.  The patient only takes it once daily.  His sodium was last checked January 15 and was normal at 139.  Wife notes hearing loss despite new hearing aids.  Current prescribed movement disorder medications: None (weaned off)  Prior meds: Carbamazepine ; Lyrica  (myoclonus with higher dosages)   ALLERGIES:   No Known Allergies   CURRENT MEDICATIONS:   Outpatient Encounter Medications as of 05/22/2024  Medication Sig   aspirin EC 81 MG tablet Take 81 mg by mouth daily.   CINNAMON  PO Take 1,000 mg by mouth daily.   fish oil-omega-3 fatty acids  1000 MG capsule Take 1 g by mouth daily.   GLYXAMBI  25-5 MG TABS Take 1 tablet by mouth daily.   insulin  glargine, 1 Unit Dial , (TOUJEO  SOLOSTAR) 300 UNIT/ML Solostar Pen Inject 10 Units into the skin at bedtime.   Insulin  Pen Needle (BD PEN NEEDLE MINI ULTRAFINE) 31G X 5 MM MISC UAD at bedtime Dx E11.9   Multiple Vitamin (MULTIVITAMIN) tablet Take 1 tablet by mouth daily.   Oxcarbazepine  (TRILEPTAL ) 300 MG tablet Take 1 tablet (300 mg total) by mouth 2 (two) times daily.   pravastatin  (PRAVACHOL ) 40 MG tablet Take 1 tablet (40 mg total) by mouth daily.   No facility-administered encounter medications on file as of 05/22/2024.     Objective:    PHYSICAL EXAMINATION:    VITALS:   Vitals:   05/22/24 0952  BP: 126/84  Pulse: 72  SpO2: 99%  Weight: 153 lb 6.4 oz (69.6 kg)     Wt Readings from Last 3 Encounters:  05/22/24 153 lb 6.4 oz (69.6 kg)  05/07/24 149 lb (67.6 kg)  02/21/24 146 lb 9.6 oz (66.5 kg)  GEN:  The patient appears stated age and is in NAD. HEENT:  Normocephalic, atraumatic.  The mucous membranes are moist. The superficial temporal arteries are without ropiness or tenderness. Cardiovascular: Regular rate rhythm Lungs: Clear to auscultation bilaterally Neck: No bruit   Neurological examination:  Orientation: The patient is alert and oriented x3. Cranial nerves: There is good facial symmetry. The speech is fluent and clear. Soft palate rises symmetrically and there is no tongue deviation. Hearing is decreased to conversational tone.  Hearing aids are making noise within the exam room Sensation: Sensation is intact to light touch throughout Motor: Strength is at least antigravity x4.  Movement examination: Tone: There is normal tone in the UE/LE Abnormal movements:  none.  No rest tremor.  No postural tremor.  Very minimal intention tremor on the right prior to programming (resolved post programming) Coordination:  There is no decremation with RAM's  I have reviewed and interpreted the following labs independently   Chemistry      Component Value Date/Time   NA 139 05/07/2024 0946   K 4.0 05/07/2024 0946   CL 100 05/07/2024 0946   CO2 25 05/07/2024 0946   BUN 25 05/07/2024 0946   CREATININE 1.13 05/07/2024 0946   CREATININE 0.96 10/06/2019 0925      Component Value Date/Time   CALCIUM 9.9 05/07/2024 0946   ALKPHOS 54 05/07/2024 0946   AST 17 05/07/2024 0946   ALT 19 05/07/2024 0946   BILITOT 0.3 05/07/2024 0946      Lab Results  Component Value Date   WBC 5.7 05/07/2024   HGB 13.9 05/07/2024   HCT 42.9 05/07/2024   MCV 92 05/07/2024   PLT 157 05/07/2024   Lab Results  Component Value Date   TSH 1.310 05/07/2024     Chemistry      Component Value Date/Time   NA 139 05/07/2024 0946   K 4.0 05/07/2024 0946   CL 100 05/07/2024 0946   CO2 25 05/07/2024 0946   BUN 25 05/07/2024 0946   CREATININE 1.13 05/07/2024 0946   CREATININE 0.96 10/06/2019 0925      Component Value Date/Time   CALCIUM 9.9 05/07/2024 0946   ALKPHOS 54 05/07/2024 0946   AST 17 05/07/2024 0946   ALT 19 05/07/2024 0946   BILITOT 0.3 05/07/2024 0946        Cc:  [name], Fonda LABOR, MD "

## 2024-05-22 ENCOUNTER — Ambulatory Visit: Admitting: Neurology

## 2024-05-22 ENCOUNTER — Encounter: Payer: Self-pay | Admitting: Neurology

## 2024-05-22 VITALS — BP 126/84 | HR 72 | Wt 153.4 lb

## 2024-05-22 DIAGNOSIS — Z9689 Presence of other specified functional implants: Secondary | ICD-10-CM | POA: Diagnosis not present

## 2024-05-22 DIAGNOSIS — G500 Trigeminal neuralgia: Secondary | ICD-10-CM | POA: Diagnosis not present

## 2024-05-22 DIAGNOSIS — G250 Essential tremor: Secondary | ICD-10-CM | POA: Diagnosis not present

## 2024-05-22 NOTE — Procedures (Signed)
 DBS Programming was performed.    Manufacturer of DBS device: [employer]  Total time spent programming was 15 minutes.  Device was confirmed to be on.  Soft start was confirmed to be on.  Impedences were checked and were within normal limits.  Battery was checked and was determined to be functioning normally and not near end of life  Final settings were as follows:   Active Contact Amplitude (V) PW (ms) Frequency (hz) Side Effects Battery  Left Brain 1-2+ 2.8 90 150  2.85  02/02/20        10/04/20 1-2+ 2.8 90 150  2.75  04/12/21 1-2+ 2.8 90 150  2.97  04/24/22 1-2+ 3.0 90 150  2.94  01/29/23 1-2+ 3.1 90 150  2.90  10/07/23 1-2+ 3.2 90 150  2.85  05/22/24 1-2+ 3.3 90 150  2.74          Right Brain        02/02/20 1-2+ 2.7 90 150  2.83  10/04/20 1-2+ 2.7 90 150  2.58  04/12/21 1-2+ 2.7 90 150  2.97  04/24/22 1-2+ 2.7 90 150  2.95  01/29/23 1-2+ 2.7 90 150  2.92  10/07/23 1-2+ 2.7 90 150  2.88  05/22/24 1-2+ 2.7 90 150  2.80

## 2024-05-22 NOTE — Patient Instructions (Signed)
 Check you device monthly and make sure it says ON/OK.  If it starts to say ERI then you need to call my office.  If it notes, EOS the device is dead and will have turned off.

## 2024-05-25 ENCOUNTER — Encounter: Admitting: Neurology

## 2024-06-08 ENCOUNTER — Encounter: Admitting: Neurology

## 2024-08-05 ENCOUNTER — Other Ambulatory Visit

## 2024-08-06 ENCOUNTER — Ambulatory Visit: Admitting: Family Medicine

## 2024-08-07 ENCOUNTER — Ambulatory Visit: Admitting: Family Medicine

## 2024-09-21 ENCOUNTER — Encounter: Payer: Self-pay | Admitting: Neurology

## 2024-12-03 ENCOUNTER — Ambulatory Visit: Payer: Self-pay

## 2024-12-04 ENCOUNTER — Ambulatory Visit
